# Patient Record
Sex: Female | Born: 1940 | Race: White | Hispanic: No | Marital: Married | Smoking: Former smoker
Health system: Southern US, Community
[De-identification: ages and names within clinical notes are randomized; demographics above are authoritative.]

## PROBLEM LIST (undated history)

## (undated) DIAGNOSIS — I639 Cerebral infarction, unspecified: Secondary | ICD-10-CM

## (undated) DIAGNOSIS — I10 Essential (primary) hypertension: Secondary | ICD-10-CM

## (undated) DIAGNOSIS — J40 Bronchitis, not specified as acute or chronic: Secondary | ICD-10-CM

## (undated) DIAGNOSIS — I340 Nonrheumatic mitral (valve) insufficiency: Secondary | ICD-10-CM

## (undated) DIAGNOSIS — E7800 Pure hypercholesterolemia, unspecified: Secondary | ICD-10-CM

## (undated) DIAGNOSIS — M4854XA Collapsed vertebra, not elsewhere classified, thoracic region, initial encounter for fracture: Secondary | ICD-10-CM

## (undated) DIAGNOSIS — Z8719 Personal history of other diseases of the digestive system: Secondary | ICD-10-CM

## (undated) DIAGNOSIS — Z973 Presence of spectacles and contact lenses: Secondary | ICD-10-CM

## (undated) DIAGNOSIS — I6529 Occlusion and stenosis of unspecified carotid artery: Secondary | ICD-10-CM

## (undated) DIAGNOSIS — Z972 Presence of dental prosthetic device (complete) (partial): Secondary | ICD-10-CM

## (undated) DIAGNOSIS — F419 Anxiety disorder, unspecified: Secondary | ICD-10-CM

## (undated) DIAGNOSIS — J189 Pneumonia, unspecified organism: Secondary | ICD-10-CM

## (undated) DIAGNOSIS — Z9689 Presence of other specified functional implants: Secondary | ICD-10-CM

## (undated) DIAGNOSIS — F0390 Unspecified dementia without behavioral disturbance: Secondary | ICD-10-CM

## (undated) DIAGNOSIS — G894 Chronic pain syndrome: Secondary | ICD-10-CM

## (undated) DIAGNOSIS — I2510 Atherosclerotic heart disease of native coronary artery without angina pectoris: Secondary | ICD-10-CM

## (undated) DIAGNOSIS — K219 Gastro-esophageal reflux disease without esophagitis: Secondary | ICD-10-CM

## (undated) DIAGNOSIS — M1990 Unspecified osteoarthritis, unspecified site: Secondary | ICD-10-CM

## (undated) HISTORY — DX: Essential (primary) hypertension: I10

## (undated) HISTORY — DX: Occlusion and stenosis of unspecified carotid artery: I65.29

## (undated) HISTORY — PX: HYSTEROTOMY: SHX1776

## (undated) HISTORY — PX: SPINAL CORD STIMULATOR INSERTION: SHX5378

## (undated) HISTORY — PX: CHOLECYSTECTOMY: SHX55

## (undated) HISTORY — PX: TONSILLECTOMY: SUR1361

## (undated) HISTORY — PX: BACK SURGERY: SHX140

## (undated) HISTORY — PX: HAND SURGERY: SHX662

## (undated) HISTORY — PX: CAROTID ENDARTERECTOMY: SUR193

## (undated) HISTORY — PX: CARPAL TUNNEL RELEASE: SHX101

## (undated) HISTORY — DX: Atherosclerotic heart disease of native coronary artery without angina pectoris: I25.10

## (undated) HISTORY — PX: MULTIPLE TOOTH EXTRACTIONS: SHX2053

## (undated) HISTORY — DX: Pneumonia, unspecified organism: J18.9

## (undated) HISTORY — DX: Collapsed vertebra, not elsewhere classified, thoracic region, initial encounter for fracture: M48.54XA

## (undated) HISTORY — PX: HIP FRACTURE SURGERY: SHX118

## (undated) HISTORY — DX: Nonrheumatic mitral (valve) insufficiency: I34.0

## (undated) HISTORY — DX: Chronic pain syndrome: G89.4

## (undated) HISTORY — DX: Pure hypercholesterolemia, unspecified: E78.00

---

## 1992-04-26 HISTORY — PX: CARDIAC CATHETERIZATION: SHX172

## 1997-09-26 ENCOUNTER — Other Ambulatory Visit: Admission: RE | Admit: 1997-09-26 | Discharge: 1997-09-26 | Payer: Self-pay | Admitting: Cardiology

## 2007-02-22 ENCOUNTER — Ambulatory Visit (HOSPITAL_COMMUNITY): Admission: RE | Admit: 2007-02-22 | Discharge: 2007-02-22 | Payer: Self-pay | Admitting: Otolaryngology

## 2007-02-27 ENCOUNTER — Encounter: Admission: RE | Admit: 2007-02-27 | Discharge: 2007-02-27 | Payer: Self-pay | Admitting: Otolaryngology

## 2009-06-16 ENCOUNTER — Encounter: Admission: RE | Admit: 2009-06-16 | Discharge: 2009-06-16 | Payer: Self-pay | Admitting: Cardiology

## 2009-12-19 ENCOUNTER — Ambulatory Visit: Payer: Self-pay | Admitting: Cardiology

## 2010-04-24 ENCOUNTER — Ambulatory Visit: Payer: Self-pay | Admitting: Cardiology

## 2010-07-14 ENCOUNTER — Other Ambulatory Visit (HOSPITAL_COMMUNITY)
Admission: RE | Admit: 2010-07-14 | Discharge: 2010-07-14 | Disposition: A | Discharge status: Home / Self Care | Payer: Medicare Other | Source: Ambulatory Visit | Attending: Gynecology | Admitting: Gynecology

## 2010-07-14 ENCOUNTER — Ambulatory Visit (INDEPENDENT_AMBULATORY_CARE_PROVIDER_SITE_OTHER): Payer: Medicare Other | Admitting: Gynecology

## 2010-07-14 ENCOUNTER — Other Ambulatory Visit: Payer: Self-pay | Admitting: Gynecology

## 2010-07-14 DIAGNOSIS — N898 Other specified noninflammatory disorders of vagina: Secondary | ICD-10-CM

## 2010-07-14 DIAGNOSIS — Z124 Encounter for screening for malignant neoplasm of cervix: Secondary | ICD-10-CM | POA: Insufficient documentation

## 2010-07-14 DIAGNOSIS — R82998 Other abnormal findings in urine: Secondary | ICD-10-CM

## 2010-07-14 DIAGNOSIS — B373 Candidiasis of vulva and vagina: Secondary | ICD-10-CM

## 2010-07-30 ENCOUNTER — Encounter: Payer: Self-pay | Admitting: Cardiology

## 2010-07-30 ENCOUNTER — Other Ambulatory Visit: Payer: Self-pay | Admitting: Cardiology

## 2010-07-30 DIAGNOSIS — I340 Nonrheumatic mitral (valve) insufficiency: Secondary | ICD-10-CM | POA: Insufficient documentation

## 2010-07-30 DIAGNOSIS — E7800 Pure hypercholesterolemia, unspecified: Secondary | ICD-10-CM | POA: Insufficient documentation

## 2010-07-30 DIAGNOSIS — I10 Essential (primary) hypertension: Secondary | ICD-10-CM | POA: Insufficient documentation

## 2010-07-30 DIAGNOSIS — R079 Chest pain, unspecified: Secondary | ICD-10-CM

## 2010-07-30 DIAGNOSIS — I2510 Atherosclerotic heart disease of native coronary artery without angina pectoris: Secondary | ICD-10-CM | POA: Insufficient documentation

## 2010-08-05 ENCOUNTER — Encounter: Payer: Self-pay | Admitting: Cardiology

## 2010-08-05 ENCOUNTER — Other Ambulatory Visit (INDEPENDENT_AMBULATORY_CARE_PROVIDER_SITE_OTHER): Payer: Medicare Other | Admitting: *Deleted

## 2010-08-05 ENCOUNTER — Ambulatory Visit (INDEPENDENT_AMBULATORY_CARE_PROVIDER_SITE_OTHER): Payer: Medicare Other | Admitting: Cardiology

## 2010-08-05 DIAGNOSIS — I209 Angina pectoris, unspecified: Secondary | ICD-10-CM

## 2010-08-05 DIAGNOSIS — E7800 Pure hypercholesterolemia, unspecified: Secondary | ICD-10-CM

## 2010-08-05 DIAGNOSIS — M1990 Unspecified osteoarthritis, unspecified site: Secondary | ICD-10-CM | POA: Insufficient documentation

## 2010-08-05 DIAGNOSIS — I10 Essential (primary) hypertension: Secondary | ICD-10-CM

## 2010-08-05 DIAGNOSIS — I2510 Atherosclerotic heart disease of native coronary artery without angina pectoris: Secondary | ICD-10-CM

## 2010-08-05 DIAGNOSIS — M129 Arthropathy, unspecified: Secondary | ICD-10-CM

## 2010-08-05 DIAGNOSIS — F17200 Nicotine dependence, unspecified, uncomplicated: Secondary | ICD-10-CM

## 2010-08-05 DIAGNOSIS — Z720 Tobacco use: Secondary | ICD-10-CM

## 2010-08-05 LAB — HEPATIC FUNCTION PANEL
ALT: 12 U/L (ref 0–35)
AST: 13 U/L (ref 0–37)
Albumin: 3.6 g/dL (ref 3.5–5.2)
Alkaline Phosphatase: 74 U/L (ref 39–117)
Bilirubin, Direct: 0.1 mg/dL (ref 0.0–0.3)
Total Bilirubin: 0.6 mg/dL (ref 0.3–1.2)

## 2010-08-05 LAB — BASIC METABOLIC PANEL: Creatinine, Ser: 0.7 mg/dL (ref 0.4–1.2)

## 2010-08-05 LAB — LIPID PANEL
HDL: 42.3 mg/dL (ref >39.00)
LDL Cholesterol: 67 mg/dL (ref 0–99)
Triglycerides: 146 mg/dL (ref 0.0–149.0)
VLDL: 29.2 mg/dL (ref 0.0–40.0)

## 2010-08-05 NOTE — Progress Notes (Signed)
History of Present Illness: This pleasant 70 year old woman is seen for a scheduled 4 month followup office visit.  She has a past history of atypical chest pain.  She does not have significant coronary disease.  She had a negative cardiac catheterization in 1994.  She had a normal stress Cardiolite in July 2009.  She denies any recent chest pain or shortness of breath.  Denies cough or sputum production.  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  Current Outpatient Prescriptions  Medication Sig Dispense Refill  . amLODipine (NORVASC) 5 MG tablet Take 5 mg by mouth daily.        Marland Kitchen aspirin 81 MG tablet Take 81 mg by mouth daily.        Marland Kitchen atorvastatin (LIPITOR) 40 MG tablet Take 40 mg by mouth daily.        . calcium carbonate (OS-CAL) 600 MG TABS Take 600 mg by mouth 2 (two) times daily with a meal.        . diazepam (VALIUM) 5 MG tablet Take 5 mg by mouth as needed.        . gabapentin (NEURONTIN) 300 MG capsule Take 900 mg by mouth at bedtime.       Marland Kitchen HYDROcodone-acetaminophen (VICODIN) 5-500 MG per tablet Take 1 tablet by mouth as needed.        . metaxalone (SKELAXIN) 800 MG tablet Take 800 mg by mouth as needed.        . metoprolol (LOPRESSOR) 50 MG tablet Take 25 mg by mouth 2 (two) times daily.        . nitroGLYCERIN (NITROSTAT) 0.4 MG SL tablet Place 0.4 mg under the tongue every 5 (five) minutes as needed.        . Omeprazole Magnesium (PRILOSEC OTC PO) Take by mouth.        . valsartan (DIOVAN) 320 MG tablet Take 320 mg by mouth daily.          Allergies  Allergen Reactions  . Hydrochlorothiazide   . Indocin   . Paxil   . Penicillins   . Pravachol   . Quinine Derivatives   . Zocor (Simvastatin - High Dose) Other (See Comments)    Weakness/no energy    Patient Active Problem List  Diagnoses  . Mitral valve regurgitation  . Hypertension  . Hypercholesteremia  . Coronary artery  disease  . Arthritis  . Tobacco abuse    History  Smoking status  . Current Everyday Smoker -- 0.5 packs/day  Smokeless tobacco  . Not on file    History  Alcohol Use No    Family History  Problem Relation Age of Onset  . Heart attack Father     Review of Systems: Constitutional: no fever chills diaphoresis or fatigue or change in weight.  Head and neck: no hearing loss, no epistaxis, no photophobia or visual disturbance. Respiratory: No cough, shortness of breath or wheezing. Cardiovascular: No chest pain peripheral edema, palpitations. Gastrointestinal: No abdominal distention, no abdominal pain, no change in bowel habits hematochezia or melena. Genitourinary: No dysuria, no frequency, no urgency, no nocturia. Musculoskeletal:No arthralgias, no back pain, no gait disturbance or myalgias. Neurological: No dizziness, no headaches, no numbness, no seizures, no syncope, no weakness, no tremors. Hematologic: No lymphadenopathy, no easy bruising. Psychiatric: No confusion, no hallucinations, no sleep disturbance.    Physical Exam: Filed Vitals:   08/05/10 1044  BP: 126/80  Pulse: 55  Her weight is 142, down 3 pounds.  The general appearance reveals a well-developed well-nourished woman in no distress.Pupils equal and reactive.   Extraocular Movements are full.  There is no scleral icterus.  The mouth and pharynx are normal.  The neck is supple.  The carotids reveal no bruits.  The jugular venous pressure is normal.  The thyroid is not enlarged.  There is no lymphadenopathy.The chest is clear to percussion and auscultation. There are no rales or rhonchi. Expansion of the chest is symmetrical.The precordium is quiet.  The first heart sound is normal.  The second heart sound is physiologically split.  There is no murmur gallop rub or click.  There is no abnormal lift or heave.The abdomen is soft and nontender. Bowel sounds are normal. The liver and spleen are not enlarged. There Are  no abdominal masses. There are no bruits.The pedal pulses are good.  There is no phlebitis or edema.  There is no cyanosis or clubbing. She has arthritic deformity of the right hand and right wrist.   Assessment / Plan: Her electrocardiogram today shows sinus bradycardia with borderline first degree heart block and minor nonspecific T-wave flattening.  She also has U waves and we will check a serum potassium to be sure she is not hypokalemic.  From the cardiac standpoint she is okay for surgery on her wrist.

## 2010-08-05 NOTE — Assessment & Plan Note (Signed)
The patient has developed some painful deformities of her right wrist and is anticipating surgery by Dr. Dominica Severin.

## 2010-08-05 NOTE — Assessment & Plan Note (Signed)
The patient has not been experiencing any chest pain or shortness of breath.  She does not have any history of significant ischemic heart disease.  She had a cardiac catheterization in 1994 which showed only scattered atherosclerosis.  Her last nuclear stress test was 11/09/07 and showed no ischemia or scar and her ejection fraction was 71% and it was a normal study.  She had an echocardiogram 06/23/05 showing mild aortic sclerosis and moderate LVH with normal systolic function and with impaired relaxation and trace mitral regurgitation but no definite mitral valve prolapse.

## 2010-08-05 NOTE — Assessment & Plan Note (Signed)
The patient continues to smoke a half pack cigarettes a day.  We counseled her about the importance of quitting altogether.  She denies any cough or sputum production.

## 2010-08-05 NOTE — Assessment & Plan Note (Signed)
Patient denies any headaches or dizziness or other symptoms of high blood pressure.

## 2010-08-07 ENCOUNTER — Telehealth: Payer: Self-pay | Admitting: *Deleted

## 2010-08-07 NOTE — Telephone Encounter (Signed)
Adv patient re-labs 

## 2010-09-04 ENCOUNTER — Telehealth: Payer: Self-pay | Admitting: Cardiology

## 2010-09-04 NOTE — Telephone Encounter (Signed)
Fax: [phone] Last EKG, ECHO, stress, OV

## 2010-09-29 ENCOUNTER — Other Ambulatory Visit: Payer: Self-pay | Admitting: Cardiology

## 2010-09-29 DIAGNOSIS — F419 Anxiety disorder, unspecified: Secondary | ICD-10-CM

## 2010-09-30 NOTE — Telephone Encounter (Signed)
escribe request  

## 2010-11-20 ENCOUNTER — Other Ambulatory Visit: Payer: Self-pay | Admitting: *Deleted

## 2010-11-20 DIAGNOSIS — R52 Pain, unspecified: Secondary | ICD-10-CM

## 2010-11-20 NOTE — Telephone Encounter (Signed)
Refilled meds per fax request.  

## 2010-11-22 MED ORDER — HYDROCODONE-ACETAMINOPHEN 5-500 MG PO TABS: 1.0000 | ORAL_TABLET | ORAL | Status: DC | PRN

## 2010-12-29 ENCOUNTER — Other Ambulatory Visit: Payer: Self-pay | Admitting: Cardiology

## 2010-12-29 DIAGNOSIS — G629 Polyneuropathy, unspecified: Secondary | ICD-10-CM

## 2011-02-01 ENCOUNTER — Other Ambulatory Visit: Payer: Self-pay | Admitting: Cardiology

## 2011-02-02 ENCOUNTER — Encounter: Payer: Self-pay | Admitting: Cardiology

## 2011-02-02 ENCOUNTER — Ambulatory Visit (INDEPENDENT_AMBULATORY_CARE_PROVIDER_SITE_OTHER): Payer: Medicare Other | Admitting: Cardiology

## 2011-02-02 VITALS — BP 137/73 | HR 78 | Ht 65.0 in | Wt 140.0 lb

## 2011-02-02 DIAGNOSIS — G589 Mononeuropathy, unspecified: Secondary | ICD-10-CM

## 2011-02-02 DIAGNOSIS — R52 Pain, unspecified: Secondary | ICD-10-CM

## 2011-02-02 DIAGNOSIS — M1990 Unspecified osteoarthritis, unspecified site: Secondary | ICD-10-CM

## 2011-02-02 DIAGNOSIS — E7800 Pure hypercholesterolemia, unspecified: Secondary | ICD-10-CM

## 2011-02-02 DIAGNOSIS — I10 Essential (primary) hypertension: Secondary | ICD-10-CM

## 2011-02-02 DIAGNOSIS — I119 Hypertensive heart disease without heart failure: Secondary | ICD-10-CM

## 2011-02-02 DIAGNOSIS — G629 Polyneuropathy, unspecified: Secondary | ICD-10-CM

## 2011-02-02 DIAGNOSIS — K219 Gastro-esophageal reflux disease without esophagitis: Secondary | ICD-10-CM

## 2011-02-02 LAB — LIPID PANEL
Cholesterol: 164 mg/dL (ref 0–200)
HDL: 42.8 mg/dL (ref >39.00)
LDL Cholesterol: 84 mg/dL (ref 0–99)
Triglycerides: 184 mg/dL — ABNORMAL HIGH (ref 0.0–149.0)
VLDL: 36.8 mg/dL (ref 0.0–40.0)

## 2011-02-02 LAB — BASIC METABOLIC PANEL
BUN: 17 mg/dL (ref 6–23)
CO2: 28 mEq/L (ref 19–32)
Calcium: 9 mg/dL (ref 8.4–10.5)
Creatinine, Ser: 0.7 mg/dL (ref 0.4–1.2)
GFR: 84.45 mL/min (ref >60.00)

## 2011-02-02 LAB — HEPATIC FUNCTION PANEL
Alkaline Phosphatase: 79 U/L (ref 39–117)
Total Protein: 7 g/dL (ref 6.0–8.3)

## 2011-02-02 MED ORDER — HYDROCODONE-ACETAMINOPHEN 5-500 MG PO TABS: 1.0000 | ORAL_TABLET | ORAL | Status: DC | PRN

## 2011-02-02 NOTE — Assessment & Plan Note (Signed)
Since last visit.  She has had successful surgery on her right thumb by Dr.G ramig

## 2011-02-02 NOTE — Progress Notes (Signed)
Denton Brick Date of Birth:  06-Jul-1940 [facility] [address]   [address] [place], [place]  [address] [phone]           Fax   [phone]  History of Present Illness: This pleasant 70 year old woman is seen for a scheduled four-month followup office visit.  She has a past history of essential hypertension, and history of hypercholesterolemia.  She has ongoing cigarette abuse and smokes a half a pack a day.  She has had a past history of atypical chest pain.  She had a cardiac catheterization in 1994, which showed mild, but diffuse 3 vessel coronary atherosclerosis, as well as a mild mitral valve prolapse.  Her last nuclear stress test was a walking adenosine Cardiolite in 11/09/07, which showed no evidence of ischemia and her ejection fraction was 71%.  Her last echocardiogram was in 2007 and showed normal LV systolic function and impaired relaxation with mild aortic sclerosis and trace mitral regurgitation and normal pulmonary artery pressure.  Current Outpatient Prescriptions  Medication Sig Dispense Refill  . amLODipine (NORVASC) 5 MG tablet Take 5 mg by mouth daily.        Marland Kitchen aspirin 81 MG tablet Take 81 mg by mouth daily.        Marland Kitchen atorvastatin (LIPITOR) 40 MG tablet TAKE ONE TABLET BY MOUTH DAILY  30 tablet  6  . calcium carbonate (OS-CAL) 600 MG TABS Take 600 mg by mouth 2 (two) times daily with a meal.        . diazepam (VALIUM) 5 MG tablet TAKE 1 TABLET BY MOUTH EVERY DAY AS NEEDED  30 tablet  5  . DIOVAN 320 MG tablet TAKE ONE TABLET BY MOUTH EVERY DAY  30 tablet  6  . gabapentin (NEURONTIN) 300 MG capsule TAKE 3 CAPSULES BY MOUTH EVERY NIGHT AT BEDTIME FOR NEUROPATHY  90 capsule  3  . HYDROcodone-acetaminophen (VICODIN) 5-500 MG per tablet Take 1 tablet by mouth as needed.  60 tablet  1  . metaxalone (SKELAXIN) 800 MG tablet Take 800 mg by mouth as needed.        . metoprolol (LOPRESSOR) 50 MG tablet Take 25 mg by mouth 2 (two) times daily.          . nitroGLYCERIN (NITROSTAT) 0.4 MG SL tablet Place 0.4 mg under the tongue every 5 (five) minutes as needed.        . Omeprazole Magnesium (PRILOSEC OTC PO) Take by mouth.          Allergies  Allergen Reactions  . Hydrochlorothiazide   . Indocin   . Paxil   . Penicillins   . Pravachol   . Quinine Derivatives   . Zocor (Simvastatin - High Dose) Other (See Comments)    Weakness/no energy    Patient Active Problem List  Diagnoses  . Mitral valve regurgitation  . Hypertension  . Hypercholesteremia  . Coronary artery disease  . Arthritis  . Tobacco abuse    History  Smoking status  . Current Everyday Smoker -- 0.5 packs/day  Smokeless tobacco  . Not on file    History  Alcohol Use No    Family History  Problem Relation Age of Onset  . Heart attack Father     Review of Systems: Constitutional: no fever chills diaphoresis or fatigue or change in weight.  Head and neck: no hearing loss, no epistaxis, no photophobia or visual disturbance. Respiratory: No cough, shortness of breath or wheezing. Cardiovascular:  No chest pain peripheral edema, palpitations. Gastrointestinal: No abdominal distention, no abdominal pain, no change in bowel habits hematochezia or melena. Genitourinary: No dysuria, no frequency, no urgency, no nocturia. Musculoskeletal:No arthralgias, no back pain, no gait disturbance or myalgias. Neurological: No dizziness, no headaches, no numbness, no seizures, no syncope, no weakness, no tremors. Hematologic: No lymphadenopathy, no easy bruising. Psychiatric: No confusion, no hallucinations, no sleep disturbance.    Physical Exam: Filed Vitals:   02/02/11 1143  BP: 137/73  Pulse: 78   Gen. appearance reveals a well-developed, well-nourished woman in distress.Pupils equal and reactive.   Extraocular Movements are full.  There is no scleral icterus.  The mouth and pharynx are normal.  The neck is supple.  The carotids reveal no bruits.  The jugular  venous pressure is normal.  The thyroid is not enlarged.  There is no lymphadenopathy.  The chest is clear to percussion and auscultation. There are no rales or rhonchi. Expansion of the chest is symmetrical.  The precordium is quiet.  The first heart sound is normal.  The second heart sound is physiologically split.  There is no murmur gallop rub or click.  There is no abnormal lift or heave.  The abdomen is soft and nontender. Bowel sounds are normal. The liver and spleen are not enlarged. There Are no abdominal masses. There are no bruits.  The pedal pulses are good.  There is no phlebitis or edema.  There is no cyanosis or clubbing. Strength is normal and symmetrical in all extremities.  There is no lateralizing weakness.  There are no sensory deficits.  The skin is warm and dry.  There is no rash.   Assessment / Plan:  Patient will continue on same medication.  She was encouraged to continue to try cutting back on cigarettes and eventually stop smoking.  Recheck 4 months for office visit and lab work.  Work today pending.

## 2011-02-02 NOTE — Patient Instructions (Addendum)
continue same dose of medications  Your physician wants you to follow-up in: 4 months You will receive a reminder letter in the mail two months in advance. If you don't receive a letter, please call our office to schedule the follow-up appointment.

## 2011-02-02 NOTE — Assessment & Plan Note (Signed)
The patient remains on Lipitor 40 mg daily.  She has not had any myalgias from Lipitor.  Blood work is pending today

## 2011-02-02 NOTE — Assessment & Plan Note (Signed)
The patient has not been expressing any dizziness or syncope.  No symptoms of congestive heart failure.  He denies any cough

## 2011-02-05 ENCOUNTER — Telehealth: Payer: Self-pay | Admitting: *Deleted

## 2011-02-05 NOTE — Progress Notes (Signed)
Advised of results

## 2011-02-05 NOTE — Telephone Encounter (Signed)
Message copied by Burnell Blanks on Fri Feb 05, 2011  9:36 AM ------      Message from: Cassell Clement      Created: Tue Feb 02, 2011  6:42 PM       CHOL         164   02/02/2011      HDL        42.80   02/02/2011      LDLCALC       84   02/02/2011      TRIG       184.0   02/02/2011      CHOLHDL        4   02/02/2011                  The triglycerides are higher this time.  I want her to be careful with carbohydrates and sweets

## 2011-02-05 NOTE — Telephone Encounter (Signed)
Advised of results

## 2011-04-12 ENCOUNTER — Other Ambulatory Visit: Payer: Self-pay | Admitting: Cardiology

## 2011-04-12 DIAGNOSIS — F419 Anxiety disorder, unspecified: Secondary | ICD-10-CM

## 2011-04-12 NOTE — Telephone Encounter (Signed)
Refill on valium

## 2011-04-16 ENCOUNTER — Other Ambulatory Visit: Payer: Self-pay | Admitting: *Deleted

## 2011-04-16 DIAGNOSIS — F419 Anxiety disorder, unspecified: Secondary | ICD-10-CM

## 2011-04-16 MED ORDER — DIAZEPAM 5 MG PO TABS: 5.0000 mg | ORAL_TABLET | Freq: Every day | ORAL | Status: DC

## 2011-04-16 NOTE — Telephone Encounter (Signed)
Refill on diazepam

## 2011-05-02 ENCOUNTER — Other Ambulatory Visit: Payer: Self-pay | Admitting: Cardiology

## 2011-05-03 NOTE — Telephone Encounter (Signed)
Refilled gabapentin

## 2011-05-12 ENCOUNTER — Other Ambulatory Visit: Payer: Self-pay | Admitting: Cardiology

## 2011-06-02 ENCOUNTER — Other Ambulatory Visit: Payer: Self-pay | Admitting: Cardiology

## 2011-06-02 NOTE — Telephone Encounter (Signed)
Refilled amlodipine 

## 2011-06-08 ENCOUNTER — Ambulatory Visit (INDEPENDENT_AMBULATORY_CARE_PROVIDER_SITE_OTHER): Payer: Medicare Other | Admitting: Cardiology

## 2011-06-08 ENCOUNTER — Encounter: Payer: Self-pay | Admitting: Cardiology

## 2011-06-08 VITALS — BP 146/76 | HR 64 | Ht 65.0 in | Wt 136.1 lb

## 2011-06-08 DIAGNOSIS — F17200 Nicotine dependence, unspecified, uncomplicated: Secondary | ICD-10-CM

## 2011-06-08 DIAGNOSIS — I119 Hypertensive heart disease without heart failure: Secondary | ICD-10-CM

## 2011-06-08 DIAGNOSIS — M129 Arthropathy, unspecified: Secondary | ICD-10-CM

## 2011-06-08 DIAGNOSIS — E7800 Pure hypercholesterolemia, unspecified: Secondary | ICD-10-CM

## 2011-06-08 DIAGNOSIS — I2510 Atherosclerotic heart disease of native coronary artery without angina pectoris: Secondary | ICD-10-CM

## 2011-06-08 DIAGNOSIS — Z716 Tobacco abuse counseling: Secondary | ICD-10-CM

## 2011-06-08 DIAGNOSIS — M1990 Unspecified osteoarthritis, unspecified site: Secondary | ICD-10-CM

## 2011-06-08 LAB — LIPID PANEL
HDL: 51.4 mg/dL (ref >39.00)
LDL Cholesterol: 63 mg/dL (ref 0–99)
Total CHOL/HDL Ratio: 3
Triglycerides: 169 mg/dL — ABNORMAL HIGH (ref 0.0–149.0)

## 2011-06-08 LAB — BASIC METABOLIC PANEL
CO2: 30 mEq/L (ref 19–32)
Calcium: 9.2 mg/dL (ref 8.4–10.5)
Creatinine, Ser: 0.7 mg/dL (ref 0.4–1.2)
GFR: 90.12 mL/min (ref >60.00)
Sodium: 142 mEq/L (ref 135–145)

## 2011-06-08 LAB — HEPATIC FUNCTION PANEL
Alkaline Phosphatase: 66 U/L (ref 39–117)
Bilirubin, Direct: 0.1 mg/dL (ref 0.0–0.3)
Total Bilirubin: 0.6 mg/dL (ref 0.3–1.2)

## 2011-06-08 MED ORDER — BUPROPION HCL ER (SR) 150 MG PO TB12: ORAL_TABLET | ORAL | Status: DC

## 2011-06-08 NOTE — Patient Instructions (Signed)
Will start Wellbutrin SR 150 mg daily for 3 days and then increase to 1 twice a day, Rx sent to [facility] Will obtain labs today and call you with the results (LP,HFP/BMET) Your physician recommends that you schedule a follow-up appointment in: 4 months with fasting labs (lp/bmet/hfp)

## 2011-06-08 NOTE — Progress Notes (Signed)
Sara [name] Date of Birth:  December 26, 1940 [facility] [address] [place], [place]  [address] [phone]         Fax   [phone]  History of Present Illness: This pleasant 71 year old woman is seen for a four-month followup office visit.  She has a past history of essential hypertension and history of hypercholesterolemia.  She has a history of a cardiac catheterization in 1994 which showed only mild three-vessel coronary atherosclerosis.  He had a normal adenosine Cardiolite stress test in 2009.  She had an echocardiogram in 2007 showing mild aortic sclerosis without stenosis.  Her last visit she's been doing well with no new cardiac symptoms.  She has been a chronic smoker but is making a real effort to quit.  She is down to just one or 2 cigarettes a day.  She is smoking a vapor cigarettes and is requesting Wellbutrin.  Current Outpatient Prescriptions  Medication Sig Dispense Refill  . amLODipine (NORVASC) 5 MG tablet Take 5 mg by mouth daily.        Marland Kitchen aspirin 81 MG tablet Take 81 mg by mouth daily.        Marland Kitchen atorvastatin (LIPITOR) 40 MG tablet TAKE ONE TABLET BY MOUTH DAILY  30 tablet  6  . calcium carbonate (OS-CAL) 600 MG TABS Take 600 mg by mouth 2 (two) times daily with a meal.        . diazepam (VALIUM) 5 MG tablet Take 1 tablet (5 mg total) by mouth daily.  30 tablet  5  . DIOVAN 320 MG tablet TAKE ONE TABLET BY MOUTH EVERY DAY  30 tablet  6  . gabapentin (NEURONTIN) 300 MG capsule TAKE 3 CAPSULES BY MOUTH EVERY NIGHT AT BEDTIME FOR NEUROPATHY  90 capsule  11  . HYDROcodone-acetaminophen (VICODIN) 5-500 MG per tablet Take 1 tablet by mouth as needed.  100 tablet  5  . metaxalone (SKELAXIN) 800 MG tablet Take 800 mg by mouth as needed.        . metoprolol (LOPRESSOR) 50 MG tablet TAKE  1/2 TABLET BY MOUTH TWICE DAILY  30 tablet  1  . nitroGLYCERIN (NITROSTAT) 0.4 MG SL tablet Place 0.4 mg under the tongue every 5 (five) minutes as needed.        .  Omeprazole Magnesium (PRILOSEC OTC PO) Take by mouth.        Marland Kitchen buPROPion (WELLBUTRIN SR) 150 MG 12 hr tablet 1 tablet daily for 3 days and then increase to 1 tablet twice a day  60 tablet  3    Allergies  Allergen Reactions  . Hydrochlorothiazide   . Indocin   . Paxil   . Penicillins   . Pravachol   . Quinine Derivatives   . Zocor (Simvastatin - High Dose) Other (See Comments)    Weakness/no energy    Patient Active Problem List  Diagnoses  . Mitral valve regurgitation  . Hypertension  . Hypercholesteremia  . Coronary artery disease  . Arthritis  . Tobacco abuse    History  Smoking status  . Current Everyday Smoker -- 0.5 packs/day  Smokeless tobacco  . Not on file    History  Alcohol Use No    Family History  Problem Relation Age of Onset  . Heart attack Father     Review of Systems: Constitutional: no fever chills diaphoresis or fatigue or change in weight.  Head and neck: no hearing loss, no epistaxis, no photophobia or visual disturbance. Respiratory:  No cough, shortness of breath or wheezing. Cardiovascular: No chest pain peripheral edema, palpitations. Gastrointestinal: No abdominal distention, no abdominal pain, no change in bowel habits hematochezia or melena. Genitourinary: No dysuria, no frequency, no urgency, no nocturia. Musculoskeletal:No arthralgias, no back pain, no gait disturbance or myalgias. Neurological: No dizziness, no headaches, no numbness, no seizures, no syncope, no weakness, no tremors. Hematologic: No lymphadenopathy, no easy bruising. Psychiatric: No confusion, no hallucinations, no sleep disturbance.    Physical Exam: Filed Vitals:   06/08/11 0946  BP: 146/76  Pulse: 64   the general appearance reveals a well-developed well-nourished woman in no distress.The head and neck exam reveals pupils equal and reactive.  Extraocular movements are full.  There is no scleral icterus.  The mouth and pharynx are normal.  The neck is  supple.  The carotids reveal no bruits.  The jugular venous pressure is normal.  The  thyroid is not enlarged.  There is no lymphadenopathy.  The chest is clear to percussion and auscultation.  There are no rales or rhonchi.  Expansion of the chest is symmetrical.  The precordium is quiet.  The first heart sound is normal.  The second heart sound is physiologically split.  There is no murmur gallop rub or click.  There is no abnormal lift or heave.  The abdomen is soft and nontender.  The bowel sounds are normal.  The liver and spleen are not enlarged.  There are no abdominal masses.  There are no abdominal bruits.  Extremities reveal good pedal pulses.  There is no phlebitis or edema.  There is no cyanosis or clubbing.  Strength is normal and symmetrical in all extremities.  There is no lateralizing weakness.  There are no sensory deficits.  The skin is warm and dry.  There is no rash.     Assessment / Plan: Continue same medication.  She is requesting a trial of Wellbutrin to help her quit smoking entirely.  We will give her Wellbutrin SR 150 mg daily for 3 days and then twice a day thereafter. From the cardiac standpoint she is cleared for hand surgery. Recheck here in 4 months for followup office visit and lab work

## 2011-06-08 NOTE — Assessment & Plan Note (Signed)
We are checking lab work today.  She is not having any side effects from her statin therapy

## 2011-06-08 NOTE — Assessment & Plan Note (Signed)
The patient has significant arthritic problems and is scheduled to undergo joint replacement of the base of her left thumb as well as carpal tunnel release on March 1 by Dr. Amanda Pea.

## 2011-06-08 NOTE — Assessment & Plan Note (Signed)
No chest pain or shortness of breath.  No palpitations dizziness or syncope

## 2011-06-09 ENCOUNTER — Telehealth: Payer: Self-pay | Admitting: *Deleted

## 2011-06-09 NOTE — Telephone Encounter (Signed)
Advised of labs 

## 2011-06-09 NOTE — Telephone Encounter (Signed)
Message copied by Burnell Blanks on Wed Jun 09, 2011  5:12 PM ------      Message from: Cassell Clement      Created: Tue Jun 08, 2011  2:18 PM       Please report.  The labs are stable.  Continue same meds.  Continue careful diet.

## 2011-07-15 ENCOUNTER — Other Ambulatory Visit: Payer: Self-pay | Admitting: Cardiology

## 2011-07-15 NOTE — Telephone Encounter (Signed)
Refilled metoprolol 

## 2011-09-13 ENCOUNTER — Other Ambulatory Visit: Payer: Self-pay | Admitting: Cardiology

## 2011-09-13 NOTE — Telephone Encounter (Signed)
Refilled hydrocodone 

## 2011-10-06 ENCOUNTER — Ambulatory Visit (INDEPENDENT_AMBULATORY_CARE_PROVIDER_SITE_OTHER): Payer: Medicare Other | Admitting: Cardiology

## 2011-10-06 ENCOUNTER — Encounter: Payer: Self-pay | Admitting: Cardiology

## 2011-10-06 VITALS — BP 118/70 | Ht 66.0 in | Wt 137.0 lb

## 2011-10-06 DIAGNOSIS — I119 Hypertensive heart disease without heart failure: Secondary | ICD-10-CM

## 2011-10-06 DIAGNOSIS — E7800 Pure hypercholesterolemia, unspecified: Secondary | ICD-10-CM

## 2011-10-06 DIAGNOSIS — I2510 Atherosclerotic heart disease of native coronary artery without angina pectoris: Secondary | ICD-10-CM

## 2011-10-06 DIAGNOSIS — Z720 Tobacco use: Secondary | ICD-10-CM

## 2011-10-06 DIAGNOSIS — F17200 Nicotine dependence, unspecified, uncomplicated: Secondary | ICD-10-CM

## 2011-10-06 LAB — LIPID PANEL
HDL: 44.7 mg/dL (ref >39.00)
LDL Cholesterol: 65 mg/dL (ref 0–99)
Total CHOL/HDL Ratio: 3
Triglycerides: 127 mg/dL (ref 0.0–149.0)
VLDL: 25.4 mg/dL (ref 0.0–40.0)

## 2011-10-06 LAB — HEPATIC FUNCTION PANEL
AST: 14 U/L (ref 0–37)
Albumin: 3.8 g/dL (ref 3.5–5.2)
Total Bilirubin: 0.3 mg/dL (ref 0.3–1.2)

## 2011-10-06 LAB — BASIC METABOLIC PANEL
CO2: 26 mEq/L (ref 19–32)
Calcium: 9.2 mg/dL (ref 8.4–10.5)
Glucose, Bld: 90 mg/dL (ref 70–99)
Potassium: 4.4 mEq/L (ref 3.5–5.1)
Sodium: 141 mEq/L (ref 135–145)

## 2011-10-06 NOTE — Progress Notes (Signed)
Quick Note:  Please report to patient. The recent labs are stable. Continue same medication and careful diet. TGs are much better. ______ 

## 2011-10-06 NOTE — Assessment & Plan Note (Signed)
She is no longer smoking regular cigarettes.  She is using an electric vapor cigarette.  She was unable to take Wellbutrin which had a paradoxical effect

## 2011-10-06 NOTE — Patient Instructions (Addendum)
Your physician recommends that you continue on your current medications as directed. Please refer to the Current Medication list given to you today.  Your physician wants you to follow-up in: 4 month with fasting labs You will receive a reminder letter in the mail two months in advance. If you don't receive a letter, please call our office to schedule the follow-up appointment.

## 2011-10-06 NOTE — Progress Notes (Signed)
Sara [name] Date of Birth:  05/16/1940 [facility] [address] [place], [place]  [address] [phone]  Fax   [phone]  HPI: This pleasant 71 year old woman is seen for a scheduled followup office visit.  She has a history of essential hypertension and a history of hypercholesterolemia.  Cardiac catheterization in 1994 showed mild three-vessel coronary atherosclerosis without obstructing lesions.  She had a normal adenosine Cardiolite stress test in 2009.  Current Outpatient Prescriptions  Medication Sig Dispense Refill  . amLODipine (NORVASC) 5 MG tablet Take 5 mg by mouth daily.        Marland Kitchen aspirin 81 MG tablet Take 81 mg by mouth daily.        Marland Kitchen atorvastatin (LIPITOR) 40 MG tablet TAKE ONE TABLET BY MOUTH DAILY  30 tablet  6  . buPROPion (WELLBUTRIN SR) 150 MG 12 hr tablet 1 tablet daily for 3 days and then increase to 1 tablet twice a day  60 tablet  3  . calcium carbonate (OS-CAL) 600 MG TABS Take 600 mg by mouth 2 (two) times daily with a meal.        . diazepam (VALIUM) 5 MG tablet Take 1 tablet (5 mg total) by mouth daily.  30 tablet  5  . DIOVAN 320 MG tablet TAKE ONE TABLET BY MOUTH EVERY DAY  30 tablet  6  . gabapentin (NEURONTIN) 300 MG capsule TAKE 3 CAPSULES BY MOUTH EVERY NIGHT AT BEDTIME FOR NEUROPATHY  90 capsule  11  . HYDROcodone-acetaminophen (VICODIN) 5-500 MG per tablet TAKE 1 TABLET BY MOUTH TWICE DAILY AS NEEDED  100 tablet  0  . metaxalone (SKELAXIN) 800 MG tablet Take 800 mg by mouth as needed.        . metoprolol (LOPRESSOR) 50 MG tablet TAKE  1/2 TABLET BY MOUTH TWICE DAILY  30 tablet  11  . nitroGLYCERIN (NITROSTAT) 0.4 MG SL tablet Place 0.4 mg under the tongue every 5 (five) minutes as needed.        . Omeprazole Magnesium (PRILOSEC OTC PO) Take by mouth.          Allergies  Allergen Reactions  . Hydrochlorothiazide   . Indomethacin   . Paroxetine Hcl   . Penicillins   . Pravachol   . Quinine Derivatives   . Wellbutrin  (Bupropion)     agitation  . Zocor (Simvastatin - High Dose) Other (See Comments)    Weakness/no energy    Patient Active Problem List  Diagnosis  . Mitral valve regurgitation  . Hypertension  . Hypercholesteremia  . Coronary artery disease  . Arthritis  . Tobacco abuse    History  Smoking status  . Current Everyday Smoker -- 0.5 packs/day  Smokeless tobacco  . Not on file    History  Alcohol Use No    Family History  Problem Relation Age of Onset  . Heart attack Father     Review of Systems: The patient denies any heat or cold intolerance.  No weight gain or weight loss.  The patient denies headaches or blurry vision.  There is no cough or sputum production.  The patient denies dizziness.  There is no hematuria or hematochezia.  The patient denies any muscle aches or arthritis.  The patient denies any rash.  The patient denies frequent falling or instability.  There is no history of depression or anxiety.  All other systems were reviewed and are negative.   Physical Exam: Filed Vitals:   10/06/11  1019  BP: 118/70   general appearance reveals a well-developed well-nourished woman in no distress.The head and neck exam reveals pupils equal and reactive.  Extraocular movements are full.  There is no scleral icterus.  The mouth and pharynx are normal.  The neck is supple.  The carotids reveal no bruits.  The jugular venous pressure is normal.  The  thyroid is not enlarged.  There is no lymphadenopathy.  The chest is clear to percussion and auscultation.  There are no rales or rhonchi.  Expansion of the chest is symmetrical.  The precordium is quiet.  The first heart sound is normal.  The second heart sound is physiologically split.  There is no murmur gallop rub or click.  There is no abnormal lift or heave.  The abdomen is soft and nontender.  The bowel sounds are normal.  The liver and spleen are not enlarged.  There are no abdominal masses.  There are no abdominal bruits.   Extremities reveal good pedal pulses.  There is no phlebitis or edema.  There is no cyanosis or clubbing.  Strength is normal and symmetrical in all extremities.  There is no lateralizing weakness.  There are no sensory deficits.  The skin is warm and dry.  There is no rash.      Assessment / Plan: Continue same meds.  Recheck in 4 months for followup office visit and fasting lab work.  Since we last saw her she had successful left wrist surgery by Dr. Amanda Pea.

## 2011-10-06 NOTE — Assessment & Plan Note (Signed)
No chest pain or shortness of breath or palpitations 

## 2011-10-06 NOTE — Assessment & Plan Note (Signed)
She is not having any side effects from her statin therapy

## 2011-10-08 ENCOUNTER — Telehealth: Payer: Self-pay | Admitting: *Deleted

## 2011-10-08 NOTE — Telephone Encounter (Signed)
Message copied by Burnell Blanks on Fri Oct 08, 2011  9:59 AM ------      Message from: Cassell Clement      Created: Wed Oct 06, 2011  3:02 PM       Please report to patient.  The recent labs are stable. Continue same medication and careful diet. TGs are much better.

## 2011-10-08 NOTE — Telephone Encounter (Signed)
Advised patient of lab results  

## 2011-11-09 ENCOUNTER — Telehealth: Payer: Self-pay | Admitting: *Deleted

## 2011-11-09 ENCOUNTER — Other Ambulatory Visit: Payer: Self-pay | Admitting: *Deleted

## 2011-11-09 ENCOUNTER — Other Ambulatory Visit: Payer: Self-pay | Admitting: Cardiology

## 2011-11-09 DIAGNOSIS — M549 Dorsalgia, unspecified: Secondary | ICD-10-CM

## 2011-11-09 NOTE — Telephone Encounter (Signed)
Opened in error

## 2011-12-31 ENCOUNTER — Other Ambulatory Visit: Payer: Self-pay | Admitting: Cardiology

## 2011-12-31 DIAGNOSIS — M549 Dorsalgia, unspecified: Secondary | ICD-10-CM

## 2012-02-02 ENCOUNTER — Other Ambulatory Visit: Payer: Self-pay | Admitting: *Deleted

## 2012-02-02 DIAGNOSIS — F419 Anxiety disorder, unspecified: Secondary | ICD-10-CM

## 2012-02-02 MED ORDER — DIAZEPAM 5 MG PO TABS: 5.0000 mg | ORAL_TABLET | Freq: Every day | ORAL | Status: DC

## 2012-02-07 ENCOUNTER — Other Ambulatory Visit: Payer: Medicare Other

## 2012-02-07 ENCOUNTER — Ambulatory Visit: Payer: Medicare Other | Admitting: Cardiology

## 2012-02-28 ENCOUNTER — Other Ambulatory Visit: Payer: Self-pay | Admitting: Cardiology

## 2012-02-28 DIAGNOSIS — R52 Pain, unspecified: Secondary | ICD-10-CM

## 2012-03-14 ENCOUNTER — Other Ambulatory Visit (INDEPENDENT_AMBULATORY_CARE_PROVIDER_SITE_OTHER): Payer: Medicare Other

## 2012-03-14 ENCOUNTER — Ambulatory Visit (INDEPENDENT_AMBULATORY_CARE_PROVIDER_SITE_OTHER): Payer: Medicare Other | Admitting: Cardiology

## 2012-03-14 ENCOUNTER — Encounter: Payer: Self-pay | Admitting: Cardiology

## 2012-03-14 VITALS — BP 138/80 | HR 76 | Ht 65.0 in | Wt 137.0 lb

## 2012-03-14 DIAGNOSIS — I259 Chronic ischemic heart disease, unspecified: Secondary | ICD-10-CM

## 2012-03-14 DIAGNOSIS — I10 Essential (primary) hypertension: Secondary | ICD-10-CM

## 2012-03-14 DIAGNOSIS — F17200 Nicotine dependence, unspecified, uncomplicated: Secondary | ICD-10-CM

## 2012-03-14 DIAGNOSIS — I119 Hypertensive heart disease without heart failure: Secondary | ICD-10-CM

## 2012-03-14 DIAGNOSIS — I2510 Atherosclerotic heart disease of native coronary artery without angina pectoris: Secondary | ICD-10-CM

## 2012-03-14 DIAGNOSIS — E7800 Pure hypercholesterolemia, unspecified: Secondary | ICD-10-CM

## 2012-03-14 DIAGNOSIS — Z720 Tobacco use: Secondary | ICD-10-CM

## 2012-03-14 LAB — LIPID PANEL
HDL: 33.7 mg/dL — ABNORMAL LOW (ref >39.00)
Total CHOL/HDL Ratio: 3
VLDL: 27 mg/dL (ref 0.0–40.0)

## 2012-03-14 LAB — BASIC METABOLIC PANEL
BUN: 19 mg/dL (ref 6–23)
CO2: 27 mEq/L (ref 19–32)
Glucose, Bld: 98 mg/dL (ref 70–99)
Potassium: 4.3 mEq/L (ref 3.5–5.1)
Sodium: 138 mEq/L (ref 135–145)

## 2012-03-14 LAB — HEPATIC FUNCTION PANEL
Bilirubin, Direct: 0.1 mg/dL (ref 0.0–0.3)
Total Bilirubin: 0.6 mg/dL (ref 0.3–1.2)

## 2012-03-14 NOTE — Assessment & Plan Note (Signed)
The patient still smokes a few cigarettes against advice.

## 2012-03-14 NOTE — Progress Notes (Signed)
Quick Note:  Please report to patient. The recent labs are stable. Continue same medication and careful diet. ______ 

## 2012-03-14 NOTE — Patient Instructions (Addendum)
Your physician recommends that you continue on your current medications as directed. Please refer to the Current Medication list given to you today.  Your physician recommends that you schedule a follow-up appointment in: 4 months with fasting labs (lp/bmet/hfp)   Will obtain labs today and call you with the results (lp./bmet/hfp) 

## 2012-03-14 NOTE — Progress Notes (Signed)
Sara [name] Date of Birth:  [birth year] [facility] [address] [place], [place]  [address] [phone]  Fax   [phone]  HPI: This pleasant 71 year old woman is seen for a scheduled followup office visit. She has a history of essential hypertension and a history of hypercholesterolemia. Cardiac catheterization in 1994 showed mild three-vessel coronary atherosclerosis without obstructing lesions.  She had a normal adenosine Cardiolite stress test in 2009.    Current Outpatient Prescriptions  Medication Sig Dispense Refill  . amLODipine (NORVASC) 5 MG tablet Take 5 mg by mouth daily.        Marland Kitchen aspirin 81 MG tablet Take 81 mg by mouth daily.        Marland Kitchen atorvastatin (LIPITOR) 40 MG tablet TAKE ONE TABLET BY MOUTH DAILY  30 tablet  6  . buPROPion (WELLBUTRIN SR) 150 MG 12 hr tablet 1 tablet daily for 3 days and then increase to 1 tablet twice a day  60 tablet  3  . calcium carbonate (OS-CAL) 600 MG TABS Take 600 mg by mouth 2 (two) times daily with a meal.        . diazepam (VALIUM) 5 MG tablet Take 1 tablet (5 mg total) by mouth daily.  30 tablet  5  . DIOVAN 320 MG tablet TAKE ONE TABLET BY MOUTH EVERY DAY  30 tablet  6  . gabapentin (NEURONTIN) 300 MG capsule TAKE 3 CAPSULES BY MOUTH EVERY NIGHT AT BEDTIME FOR NEUROPATHY  90 capsule  11  . HYDROcodone-acetaminophen (VICODIN) 5-500 MG per tablet TAKE 1 TABLET BY MOUTH TWICE DAILY AS NEEDED  100 tablet  0  . metaxalone (SKELAXIN) 800 MG tablet Take 800 mg by mouth as needed.        . metoprolol (LOPRESSOR) 50 MG tablet TAKE  1/2 TABLET BY MOUTH TWICE DAILY  30 tablet  11  . nitroGLYCERIN (NITROSTAT) 0.4 MG SL tablet Place 0.4 mg under the tongue every 5 (five) minutes as needed.        . Omeprazole Magnesium (PRILOSEC OTC PO) Take by mouth.          Allergies  Allergen Reactions  . Hydrochlorothiazide   . Indomethacin   . Paroxetine Hcl   . Penicillins   . Pravachol   . Quinine Derivatives   . Wellbutrin  (Bupropion)     agitation  . Zocor (Simvastatin - High Dose) Other (See Comments)    Weakness/no energy    Patient Active Problem List  Diagnosis  . Mitral valve regurgitation  . Hypertension  . Hypercholesteremia  . Coronary artery disease  . Arthritis  . Tobacco abuse    History  Smoking status  . Current Every Day Smoker -- 0.5 packs/day  Smokeless tobacco  . Not on file    History  Alcohol Use No    Family History  Problem Relation Age of Onset  . Heart attack Father     Review of Systems: The patient denies any heat or cold intolerance.  No weight gain or weight loss.  The patient denies headaches or blurry vision.  There is no cough or sputum production.  The patient denies dizziness.  There is no hematuria or hematochezia.  The patient denies any muscle aches or arthritis.  The patient denies any rash.  The patient denies frequent falling or instability.  There is no history of depression or anxiety.  All other systems were reviewed and are negative.   Physical Exam: Filed Vitals:  03/14/12 1103  BP: 138/80  Pulse: 76   the general appearance reveals a well-developed well-nourished woman in no distress.The head and neck exam reveals pupils equal and reactive.  Extraocular movements are full.  There is no scleral icterus.  The mouth and pharynx are normal.  The neck is supple.  The carotids reveal no bruits.  The jugular venous pressure is normal.  The  thyroid is not enlarged.  There is no lymphadenopathy.  The chest is clear to percussion and auscultation.  There are no rales or rhonchi.  Expansion of the chest is symmetrical.  The precordium is quiet.  The first heart sound is normal.  The second heart sound is physiologically split.  There is a soft apical systolic murmur.  There is no abnormal lift or heave.  The abdomen is soft and nontender.  The bowel sounds are normal.  The liver and spleen are not enlarged.  There are no abdominal masses.  There are no  abdominal bruits.  Extremities reveal good pedal pulses.  There is no phlebitis or edema.  There is no cyanosis or clubbing.  Strength is normal and symmetrical in all extremities.  There is no lateralizing weakness.  There are no sensory deficits.  The skin is warm and dry.  There is no rash.      Assessment / Plan: Continue same medication.  Recheck in 4 months for followup office visit lipid panel hepatic function panel and basal metabolic panel.  Blood work today pending

## 2012-03-14 NOTE — Assessment & Plan Note (Signed)
Blood pressure was remaining stable on current therapy.  No severe headaches or dizzy spells.  No symptoms of CHF.

## 2012-03-14 NOTE — Assessment & Plan Note (Signed)
The patient has not had any recurrent chest pain or angina pectoris.  No sublingual nitroglycerin use.

## 2012-03-17 ENCOUNTER — Telehealth: Payer: Self-pay | Admitting: *Deleted

## 2012-03-17 NOTE — Telephone Encounter (Signed)
Advised patient of lab results  

## 2012-03-17 NOTE — Telephone Encounter (Signed)
Message copied by Burnell Blanks on Fri Mar 17, 2012  9:08 AM ------      Message from: Cassell Clement      Created: Tue Mar 14, 2012  4:28 PM       Please report to patient.  The recent labs are stable. Continue same medication and careful diet.

## 2012-03-21 ENCOUNTER — Other Ambulatory Visit: Payer: Self-pay

## 2012-03-21 MED ORDER — ATORVASTATIN CALCIUM 40 MG PO TABS: 40.0000 mg | ORAL_TABLET | Freq: Every day | ORAL | Status: DC

## 2012-04-10 ENCOUNTER — Inpatient Hospital Stay (HOSPITAL_COMMUNITY)
Admission: EM | Admit: 2012-04-10 | Discharge: 2012-04-13 | DRG: 065 | Disposition: A | Discharge status: Home / Self Care | Payer: Medicare Other | Attending: Internal Medicine | Admitting: Internal Medicine

## 2012-04-10 ENCOUNTER — Encounter (HOSPITAL_COMMUNITY): Payer: Self-pay | Admitting: Nurse Practitioner

## 2012-04-10 ENCOUNTER — Emergency Department (HOSPITAL_COMMUNITY): Payer: Medicare Other

## 2012-04-10 DIAGNOSIS — Z79899 Other long term (current) drug therapy: Secondary | ICD-10-CM

## 2012-04-10 DIAGNOSIS — I671 Cerebral aneurysm, nonruptured: Secondary | ICD-10-CM

## 2012-04-10 DIAGNOSIS — R531 Weakness: Secondary | ICD-10-CM

## 2012-04-10 DIAGNOSIS — G8190 Hemiplegia, unspecified affecting unspecified side: Secondary | ICD-10-CM | POA: Diagnosis present

## 2012-04-10 DIAGNOSIS — I2510 Atherosclerotic heart disease of native coronary artery without angina pectoris: Secondary | ICD-10-CM

## 2012-04-10 DIAGNOSIS — E785 Hyperlipidemia, unspecified: Secondary | ICD-10-CM | POA: Diagnosis present

## 2012-04-10 DIAGNOSIS — Z7982 Long term (current) use of aspirin: Secondary | ICD-10-CM

## 2012-04-10 DIAGNOSIS — G609 Hereditary and idiopathic neuropathy, unspecified: Secondary | ICD-10-CM | POA: Diagnosis present

## 2012-04-10 DIAGNOSIS — I340 Nonrheumatic mitral (valve) insufficiency: Secondary | ICD-10-CM

## 2012-04-10 DIAGNOSIS — I720 Aneurysm of carotid artery: Secondary | ICD-10-CM | POA: Diagnosis present

## 2012-04-10 DIAGNOSIS — F17200 Nicotine dependence, unspecified, uncomplicated: Secondary | ICD-10-CM | POA: Diagnosis present

## 2012-04-10 DIAGNOSIS — E7800 Pure hypercholesterolemia, unspecified: Secondary | ICD-10-CM

## 2012-04-10 DIAGNOSIS — Z880 Allergy status to penicillin: Secondary | ICD-10-CM

## 2012-04-10 DIAGNOSIS — I639 Cerebral infarction, unspecified: Secondary | ICD-10-CM

## 2012-04-10 DIAGNOSIS — M1990 Unspecified osteoarthritis, unspecified site: Secondary | ICD-10-CM

## 2012-04-10 DIAGNOSIS — I6522 Occlusion and stenosis of left carotid artery: Secondary | ICD-10-CM

## 2012-04-10 DIAGNOSIS — Z888 Allergy status to other drugs, medicaments and biological substances status: Secondary | ICD-10-CM

## 2012-04-10 DIAGNOSIS — I63239 Cerebral infarction due to unspecified occlusion or stenosis of unspecified carotid arteries: Principal | ICD-10-CM | POA: Diagnosis present

## 2012-04-10 DIAGNOSIS — I10 Essential (primary) hypertension: Secondary | ICD-10-CM

## 2012-04-10 DIAGNOSIS — I498 Other specified cardiac arrhythmias: Secondary | ICD-10-CM | POA: Diagnosis present

## 2012-04-10 DIAGNOSIS — Z720 Tobacco use: Secondary | ICD-10-CM | POA: Diagnosis present

## 2012-04-10 DIAGNOSIS — M6281 Muscle weakness (generalized): Secondary | ICD-10-CM

## 2012-04-10 HISTORY — DX: Personal history of other diseases of the digestive system: Z87.19

## 2012-04-10 HISTORY — DX: Cerebral infarction, unspecified: I63.9

## 2012-04-10 HISTORY — DX: Unspecified osteoarthritis, unspecified site: M19.90

## 2012-04-10 LAB — CBC WITH DIFFERENTIAL/PLATELET
Eosinophils Absolute: 0.2 10*3/uL (ref 0.0–0.7)
Eosinophils Relative: 2 % (ref 0–5)
HCT: 37.8 % (ref 36.0–46.0)
Lymphs Abs: 3.3 10*3/uL (ref 0.7–4.0)
MCH: 30.5 pg (ref 26.0–34.0)
MCV: 92.2 fL (ref 78.0–100.0)
Monocytes Absolute: 0.6 10*3/uL (ref 0.1–1.0)
Monocytes Relative: 6 % (ref 3–12)
Platelets: 272 10*3/uL (ref 150–400)
RBC: 4.1 MIL/uL (ref 3.87–5.11)

## 2012-04-10 LAB — COMPREHENSIVE METABOLIC PANEL
BUN: 20 mg/dL (ref 6–23)
Calcium: 9.5 mg/dL (ref 8.4–10.5)
Creatinine, Ser: 0.78 mg/dL (ref 0.50–1.10)
GFR calc Af Amer: >90 mL/min (ref >90)
GFR calc non Af Amer: 81 mL/min — ABNORMAL LOW (ref >90)
Glucose, Bld: 102 mg/dL — ABNORMAL HIGH (ref 70–99)
Sodium: 141 mEq/L (ref 135–145)
Total Protein: 6.4 g/dL (ref 6.0–8.3)

## 2012-04-10 LAB — URINALYSIS, ROUTINE W REFLEX MICROSCOPIC
Ketones, ur: NEGATIVE mg/dL
Leukocytes, UA: NEGATIVE
Protein, ur: NEGATIVE mg/dL
Urobilinogen, UA: 1 mg/dL (ref 0.0–1.0)

## 2012-04-10 LAB — PROTIME-INR: Prothrombin Time: 12.7 seconds (ref 11.6–15.2)

## 2012-04-10 MED ORDER — ASPIRIN 81 MG PO TABS: 81.0000 mg | ORAL_TABLET | Freq: Every day | ORAL | Status: DC

## 2012-04-10 MED ORDER — SODIUM CHLORIDE 0.9 % IV SOLN: 250.0000 mL | INTRAVENOUS | Status: DC | PRN

## 2012-04-10 MED ORDER — ATORVASTATIN CALCIUM 40 MG PO TABS
40.0000 mg | ORAL_TABLET | Freq: Every day | ORAL | Status: DC
  Administered 2012-04-11 – 2012-04-12 (×2): 40 mg via ORAL
  Filled 2012-04-10 (×3): qty 1

## 2012-04-10 MED ORDER — SODIUM CHLORIDE 0.9 % IV BOLUS (SEPSIS)
500.0000 mL | Freq: Once | INTRAVENOUS | Status: AC
  Administered 2012-04-10: 500 mL via INTRAVENOUS

## 2012-04-10 MED ORDER — ATORVASTATIN CALCIUM 40 MG PO TABS
40.0000 mg | ORAL_TABLET | Freq: Once | ORAL | Status: AC
  Administered 2012-04-11: 40 mg via ORAL
  Filled 2012-04-10: qty 1

## 2012-04-10 MED ORDER — AMLODIPINE BESYLATE 5 MG PO TABS
5.0000 mg | ORAL_TABLET | Freq: Every day | ORAL | Status: DC
  Filled 2012-04-10: qty 1

## 2012-04-10 MED ORDER — ASPIRIN 81 MG PO CHEW
81.0000 mg | CHEWABLE_TABLET | Freq: Every day | ORAL | Status: DC
  Administered 2012-04-11 (×2): 81 mg via ORAL
  Filled 2012-04-10 (×2): qty 1

## 2012-04-10 MED ORDER — DEXTROSE-NACL 5-0.45 % IV SOLN
INTRAVENOUS | Status: AC
  Administered 2012-04-10: 22:00:00 via INTRAVENOUS

## 2012-04-10 MED ORDER — SODIUM CHLORIDE 0.9 % IJ SOLN: 3.0000 mL | INTRAMUSCULAR | Status: DC | PRN

## 2012-04-10 MED ORDER — ACETAMINOPHEN 325 MG PO TABS
650.0000 mg | ORAL_TABLET | Freq: Once | ORAL | Status: AC
  Administered 2012-04-10: 650 mg via ORAL
  Filled 2012-04-10: qty 2

## 2012-04-10 MED ORDER — SODIUM CHLORIDE 0.9 % IJ SOLN
3.0000 mL | Freq: Two times a day (BID) | INTRAMUSCULAR | Status: DC
  Administered 2012-04-11 – 2012-04-12 (×2): 3 mL via INTRAVENOUS

## 2012-04-10 MED ORDER — SODIUM CHLORIDE 0.9 % IJ SOLN
3.0000 mL | Freq: Two times a day (BID) | INTRAMUSCULAR | Status: DC
  Administered 2012-04-11 – 2012-04-13 (×4): 3 mL via INTRAVENOUS

## 2012-04-10 NOTE — ED Provider Notes (Signed)
History     CSN: [id number]  Arrival date & time 04/10/12  1906   First MD Initiated Contact with Patient 04/10/12 1918     Chief Complaint  Patient presents with  . Weakness   HPI: Sara [name] is a 71 yo CF with history of HTN, HLD, tobacco abuse who presents with right arm weakness and headache. Symptoms started this morning at 7 am with frontal HA, described as aching, non-radiating, no exacerbating factors, relieved with Tylenol in ED. At two o'clock this afternoon she was having difficulty writing checks due to hand weakness. It took her one hour to write four checks. These symptoms persisted but she did not seek care until her husband arrived home. She has never had these symptoms before. She denies CP, SOB, syncope, abdominal pain or dizziness.   Past Medical History  Diagnosis Date  . Mitral valve regurgitation     trace  . Hypertension   . Hypercholesteremia   . Coronary artery disease     Past Surgical History  Procedure Date  . Cardiac catheterization 1994    Family History  Problem Relation Age of Onset  . Heart attack Father     History  Substance Use Topics  . Smoking status: Current Every Day Smoker -- 0.5 packs/day    Types: Cigarettes  . Smokeless tobacco: Not on file  . Alcohol Use: No    OB History    Grav Para Term Preterm Abortions TAB SAB Ect Mult Living                  Review of Systems  Constitutional: Negative for fever, chills, diaphoresis and fatigue.  HENT: Negative for congestion, rhinorrhea, trouble swallowing, neck pain and neck stiffness.   Eyes: Negative for photophobia and visual disturbance.  Respiratory: Negative for cough, shortness of breath and wheezing.   Cardiovascular: Negative for chest pain and palpitations.  Gastrointestinal: Negative for nausea, vomiting and abdominal pain.  Genitourinary: Negative for dysuria, decreased urine volume and vaginal pain.  Musculoskeletal: Negative for back pain, joint swelling and  arthralgias.  Skin: Negative for pallor and rash.  Neurological: Positive for weakness (right arm), numbness and headaches. Negative for dizziness, syncope and speech difficulty.  Psychiatric/Behavioral: Negative for confusion and agitation.  All other systems reviewed and are negative.    Allergies  Hydrochlorothiazide; Indomethacin; Paroxetine hcl; Penicillins; Pravachol; Quinine derivatives; Wellbutrin; and Zocor  Home Medications   Current Outpatient Rx  Name  Route  Sig  Dispense  Refill  . AMLODIPINE BESYLATE 5 MG PO TABS   Oral   Take 5 mg by mouth daily.           . ASPIRIN 81 MG PO TABS   Oral   Take 81 mg by mouth daily.           . ATORVASTATIN CALCIUM 40 MG PO TABS   Oral   Take 1 tablet (40 mg total) by mouth daily.   30 tablet   6   . BUPROPION HCL ER (SR) 150 MG PO TB12      1 tablet daily for 3 days and then increase to 1 tablet twice a day   60 tablet   3   . CALCIUM CARBONATE 600 MG PO TABS   Oral   Take 600 mg by mouth 2 (two) times daily with a meal.           . DIAZEPAM 5 MG PO TABS   Oral   Take  1 tablet (5 mg total) by mouth daily.   30 tablet   5   . DIOVAN 320 MG PO TABS      TAKE ONE TABLET BY MOUTH EVERY DAY   30 tablet   6   . GABAPENTIN 300 MG PO CAPS      TAKE 3 CAPSULES BY MOUTH EVERY NIGHT AT BEDTIME FOR NEUROPATHY   90 capsule   11   . HYDROCODONE-ACETAMINOPHEN 5-500 MG PO TABS      TAKE 1 TABLET BY MOUTH TWICE DAILY AS NEEDED   100 tablet   0   . METAXALONE 800 MG PO TABS   Oral   Take 800 mg by mouth as needed.           Marland Kitchen METOPROLOL TARTRATE 50 MG PO TABS      TAKE  1/2 TABLET BY MOUTH TWICE DAILY   30 tablet   11   . NITROGLYCERIN 0.4 MG SL SUBL   Sublingual   Place 0.4 mg under the tongue every 5 (five) minutes as needed.           Marland Kitchen PRILOSEC OTC PO   Oral   Take by mouth.             BP 148/56  Pulse 78  Temp 98.6 F (37 C) (Oral)  Resp 18  Ht 5\' 5"  (1.651 m)  Wt 135 lb 14.4 oz  (61.644 kg)  BMI 22.62 kg/m2  SpO2 99%  Physical Exam  Nursing note and vitals reviewed. Constitutional: She is oriented to person, place, and time. She is cooperative. No distress.       Thin, elderly female. Interactive and cooperative.   HENT:  Head: Normocephalic and atraumatic.  Mouth/Throat: Oropharynx is clear and moist and mucous membranes are normal.  Eyes: Conjunctivae normal and EOM are normal. Pupils are equal, round, and reactive to light.  Neck: Trachea normal and normal range of motion. Neck supple. No JVD present.  Cardiovascular: Normal rate, regular rhythm, S1 normal, S2 normal and normal heart sounds.  Exam reveals no decreased pulses.   Pulmonary/Chest: Effort normal and breath sounds normal. She has no decreased breath sounds.  Abdominal: Soft. Normal appearance and bowel sounds are normal. There is no tenderness.  Musculoskeletal: She exhibits no edema.  Neurological: She is alert and oriented to person, place, and time. She has normal reflexes. No cranial nerve deficit. GCS eye subscore is 4. GCS verbal subscore is 5. GCS motor subscore is 6.       4+/5 strength in major muscle groups RUE 5/5 in all other major muscle groups Drift and decreased sensation noted to right arm  Skin: Skin is warm and dry.    ED Course  Procedures  Labs Reviewed  COMPREHENSIVE METABOLIC PANEL - Abnormal; Notable for the following:    Glucose, Bld 102 (*)     Albumin 3.4 (*)     GFR calc non Af Amer 81 (*)     All other components within normal limits  CBC WITH DIFFERENTIAL  PROTIME-INR  URINALYSIS, ROUTINE W REFLEX MICROSCOPIC  BASIC METABOLIC PANEL  CBC  HEMOGLOBIN A1C  LIPID PANEL   Ct Head Wo Contrast  04/10/2012  *RADIOLOGY REPORT*  Clinical Data: Right-sided weakness and upper extremity numbness.  CT HEAD WITHOUT CONTRAST  Technique:  Contiguous axial images were obtained from the base of the skull through the vertex without contrast.  Comparison: None.  Findings:  Minimally enlarged ventricles and subarachnoid spaces.  Minimal patchy white matter low density in both cerebral hemispheres.  No intracranial hemorrhage, mass lesion or CT evidence of acute infarction.  Bilateral cavernous internal carotid artery atheromatous calcifications.  Unremarkable bones and included paranasal sinuses.  IMPRESSION:  1.  No acute abnormality. 2.  Minimal atrophy and minimal chronic small vessel white matter ischemic changes in both cerebral hemispheres.   Original Report Authenticated By: Beckie Salts, M.D.    1. Right sided weakness   2. Coronary artery disease   3. Hypercholesteremia   4. Hypertension    MDM  71 yo CF with history of HTN, HLD, tobacco abuse who presents with right arm weakness and headache. Afebrile, vital signs stable. Concern for CVA due to persistent right arm weakness. No code stroke initiated as she is outside window. Head CT without acute abnormality. Labs unremarkable. UA not infected. ECG without ongoing ischemia. Discussed with Neurology who recommended small fluid bolus which was given. Again no TPA given as patient is outside the TPA window. Admitted to Hospitalist service in stable condition. BP remained adequate. No new neurologic deficits, in fact symptoms continued to improve. Discussed w/u with patient and need for admission. She is in agreement with plan.   Reviewed imaging, labs and previous medical records, utilized in MDM  Discussed case with Dr. Bebe Shaggy  Clinical Impression 1. Right arm weakness 2. Tobacco abuse         Margie Billet, MD 04/11/12 [phone]

## 2012-04-10 NOTE — ED Notes (Signed)
Per EMS pt Last seen normal 0700 AM 04/10/2012 pt sts feeling right arm weakness and blurry vision around 2pm when she was trying to write checks. No facial droop, weak right grip. BP 169/69 HR 75 R 18

## 2012-04-10 NOTE — Consult Note (Signed)
Reason for Consult: right hemiparesis  CC: right sided weakness  History is obtained from:Patient, family  HPI: Sara [name] is a 71 y.o. female who noticed that she was having trouble writing earlier today. She waited until her husband came home and he insisted she come to the ER. She has a persistent problem. She has some numbness associated.   LSN: Earlier today(unclear but > 3 hours before presentation) tpa given: no, out of window.   ROS: A 14 point ROS was performed and is negative except as noted in the HPI.  Past Medical History  Diagnosis Date  . Mitral valve regurgitation     trace  . Hypertension   . Hypercholesteremia   . Coronary artery disease     Family History: Father - MI  Social History: Tob: smoker  Exam: Current vital signs: BP 148/56  Pulse 78  Temp 98.6 F (37 C) (Oral)  Resp 18  Ht 5\' 5"  (1.651 m)  Wt 61.644 kg (135 lb 14.4 oz)  BMI 22.62 kg/m2  SpO2 99% Vital signs in last 24 hours: Temp:  [97.9 F (36.6 C)-98.7 F (37.1 C)] 98.6 F (37 C) (12/16 2315) Pulse Rate:  [55-78] 78  (12/16 2315) Resp:  [13-18] 18  (12/16 2315) BP: (88-151)/(49-71) 148/56 mmHg (12/16 2315) SpO2:  [96 %-99 %] 99 % (12/16 2315) Weight:  [61.644 kg (135 lb 14.4 oz)] 61.644 kg (135 lb 14.4 oz) (12/16 2315)  General: in bed, NAD CV: RRR Mental Status: Patient is awake, alert, oriented to person, place, month, year, and situation. Immediate and remote memory are intact. Patient is able to give a clear and coherent history. Cranial Nerves: II: Visual Fields are full. Pupils are equal, round, and reactive to light.  Discs are difficult to visualize. III,IV, VI: EOMI without ptosis or diploplia.  V: Facial sensation is symmetric to temperature VII: Facial movement is symmetric.  VIII: hearing is intact to voice X: Uvula elevates symmetrically XI: Shoulder shrug is symmetric. XII: tongue is midline without atrophy or fasciculations.  Motor: Tone is normal.  Bulk is normal. She has a very mild 4+/5 weakness in the RUE, 5/5 otherwise. She does have drift of that arm.  Sensory: Sensation is diminished to pin on the right arm.  Deep Tendon Reflexes: 2+ and symmetric in the biceps and patellae. Cerebellar: FNF are intact on left, consistent with mild weakness on right.  Gait: Did not assess due to multiple monitors in the ER setting.   I have reviewed labs in epic and the results pertinent to this consultation are: CBC, BMP unremarkable.   I have reviewed the images obtained:CT head - no acute findings  Impression: 71 yo F with new right arm weakness and numbness. She likely has a new small stroke. She will need a workup as such.   Recommendations: 1. HgbA1c, fasting lipid panel 2. MRI, MRA  of the brain without contrast 3. PT consult, OT consult 4. Echocardiogram 5. Carotid dopplers 6. Prophylactic therapy-Antiplatelet med: Aspirin - dose 81 mg 7. Risk factor modification 8. Telemetry monitoring 9. Frequent neuro checks     Ritta Slot, MD [facility] [phone]  If 7pm- 7am, please page neurology on call at [phone].

## 2012-04-10 NOTE — H&P (Signed)
Chief Complaint:  Rt hand weakness  HPI: 71 yo female with subjective rt hand weakness for the most of the day today without any other neurological deficits except a headache that started the last several hours in the ed relieveing with tylenol.  No recent illnesses.  No fevers.  No n/v/d.  No cp no sob no le weakness.  No pain in arms.  No abd pain.  No rashes.  Asked to admit for concern of cva.  No focal neuro deficits on exam.  None noted by her husband or dtr who are both  Present.  Review of Systems:  O/w neg  Past Medical History: Past Medical History  Diagnosis Date  . Mitral valve regurgitation     trace  . Hypertension   . Hypercholesteremia   . Coronary artery disease    Past Surgical History  Procedure Date  . Cardiac catheterization 1994    Medications: Prior to Admission medications   Medication Sig Start Date End Date Taking? Authorizing Provider  amLODipine (NORVASC) 5 MG tablet Take 5 mg by mouth daily.     Yes Historical Provider, MD  aspirin 81 MG tablet Take 81 mg by mouth daily.     Yes Historical Provider, MD  atorvastatin (LIPITOR) 40 MG tablet Take 1 tablet (40 mg total) by mouth daily. 03/21/12  Yes Cassell Clement, MD  DIOVAN 320 MG tablet TAKE ONE TABLET BY MOUTH EVERY DAY 02/01/11  Yes Cassell Clement, MD  gabapentin (NEURONTIN) 300 MG capsule TAKE 3 CAPSULES BY MOUTH EVERY NIGHT AT BEDTIME FOR NEUROPATHY 05/02/11  Yes Cassell Clement, MD  HYDROcodone-acetaminophen (VICODIN) 5-500 MG per tablet TAKE 1 TABLET BY MOUTH TWICE DAILY AS NEEDED 02/28/12  Yes Cassell Clement, MD  metaxalone (SKELAXIN) 800 MG tablet Take 800 mg by mouth as needed.     Yes Historical Provider, MD  metoprolol (LOPRESSOR) 50 MG tablet TAKE  1/2 TABLET BY MOUTH TWICE DAILY 07/15/11  Yes Cassell Clement, MD  Omeprazole Magnesium (PRILOSEC OTC PO) Take by mouth.     Yes Historical Provider, MD  buPROPion (WELLBUTRIN SR) 150 MG 12 hr tablet 1 tablet daily for 3 days and then increase  to 1 tablet twice a day 06/08/11   Cassell Clement, MD  calcium carbonate (OS-CAL) 600 MG TABS Take 600 mg by mouth 2 (two) times daily with a meal.      Historical Provider, MD  diazepam (VALIUM) 5 MG tablet Take 1 tablet (5 mg total) by mouth daily. 02/02/12   Cassell Clement, MD  nitroGLYCERIN (NITROSTAT) 0.4 MG SL tablet Place 0.4 mg under the tongue every 5 (five) minutes as needed.      Historical Provider, MD    Allergies:   Allergies  Allergen Reactions  . Hydrochlorothiazide   . Indomethacin   . Paroxetine Hcl   . Penicillins   . Pravachol   . Quinine Derivatives   . Wellbutrin (Bupropion)     agitation  . Zocor (Simvastatin - High Dose) Other (See Comments)    Weakness/no energy    Social History:  reports that she has been smoking Cigarettes.  She has been smoking about .5 packs per day. She does not have any smokeless tobacco history on file. She reports that she does not drink alcohol. Her drug history not on file.  Family History: Family History  Problem Relation Age of Onset  . Heart attack Father     Physical Exam: Filed Vitals:   04/10/12 1919 04/10/12 2022  BP:  138/49   Pulse: 68   Temp: 97.9 F (36.6 C) 98.7 F (37.1 C)  TempSrc: Oral   Resp: 15   SpO2: 97%    General appearance: alert, cooperative and no distress Neck: no JVD and supple, symmetrical, trachea midline Lungs: clear to auscultation bilaterally Heart: regular rate and rhythm, S1, S2 normal, no murmur, click, rub or gallop Abdomen: soft, non-tender; bowel sounds normal; no masses,  no organomegaly Extremities: extremities normal, atraumatic, no cyanosis or edema Pulses: 2+ and symmetric Skin: Skin color, texture, turgor normal. No rashes or lesions Neurologic: Grossly normal  Cranial nn intact 5/5 strength bilateral upper and lower ext    Labs on Admission:   [facility] 04/10/12 1947  NA 141  K 4.1  CL 106  CO2 25  GLUCOSE 102*  BUN 20  CREATININE 0.78  CALCIUM 9.5  MG --   PHOS --    Basename 04/10/12 1947  AST 11  ALT 8  ALKPHOS 88  BILITOT 0.3  PROT 6.4  ALBUMIN 3.4*    Basename 04/10/12 1947  WBC 9.3  NEUTROABS 5.2  HGB 12.5  HCT 37.8  MCV 92.2  PLT 272    Radiological Exams on Admission: Ct Head Wo Contrast  04/10/2012  *RADIOLOGY REPORT*  Clinical Data: Right-sided weakness and upper extremity numbness.  CT HEAD WITHOUT CONTRAST  Technique:  Contiguous axial images were obtained from the base of the skull through the vertex without contrast.  Comparison: None.  Findings: Minimally enlarged ventricles and subarachnoid spaces. Minimal patchy white matter low density in both cerebral hemispheres.  No intracranial hemorrhage, mass lesion or CT evidence of acute infarction.  Bilateral cavernous internal carotid artery atheromatous calcifications.  Unremarkable bones and included paranasal sinuses.  IMPRESSION:  1.  No acute abnormality. 2.  Minimal atrophy and minimal chronic small vessel white matter ischemic changes in both cerebral hemispheres.   Original Report Authenticated By: Beckie Salts, M.D.     Assessment/Plan 71 yo female with rt hand weakness subjective  Principal Problem:  *Right sided weakness Active Problems:  Hypertension  Hypercholesteremia  Coronary artery disease  Tobacco abuse  ekg nsr no acute changes.  cth neg.  Ck mri brain.  Neurology consulted.  Cont baby asa.  No focal def on exam.  Ck neuro ck overnight.  Further recs and w/u per neurology.    Aulton Routt A 04/10/2012, 9:18 PM

## 2012-04-11 ENCOUNTER — Inpatient Hospital Stay (HOSPITAL_COMMUNITY): Payer: Medicare Other

## 2012-04-11 DIAGNOSIS — I671 Cerebral aneurysm, nonruptured: Secondary | ICD-10-CM | POA: Diagnosis present

## 2012-04-11 DIAGNOSIS — I6350 Cerebral infarction due to unspecified occlusion or stenosis of unspecified cerebral artery: Secondary | ICD-10-CM

## 2012-04-11 DIAGNOSIS — I639 Cerebral infarction, unspecified: Secondary | ICD-10-CM | POA: Diagnosis present

## 2012-04-11 LAB — HEMOGLOBIN A1C: Hgb A1c MFr Bld: 5.9 % — ABNORMAL HIGH (ref <5.7)

## 2012-04-11 LAB — BASIC METABOLIC PANEL
Calcium: 9 mg/dL (ref 8.4–10.5)
Creatinine, Ser: 0.62 mg/dL (ref 0.50–1.10)
GFR calc non Af Amer: 87 mL/min — ABNORMAL LOW (ref >90)
Glucose, Bld: 100 mg/dL — ABNORMAL HIGH (ref 70–99)
Sodium: 143 mEq/L (ref 135–145)

## 2012-04-11 LAB — CBC
MCH: 29.9 pg (ref 26.0–34.0)
Platelets: 241 10*3/uL (ref 150–400)
RBC: 3.84 MIL/uL — ABNORMAL LOW (ref 3.87–5.11)
RDW: 13.9 % (ref 11.5–15.5)

## 2012-04-11 LAB — LIPID PANEL
LDL Cholesterol: 52 mg/dL (ref 0–99)
Triglycerides: 166 mg/dL — ABNORMAL HIGH (ref <150)

## 2012-04-11 MED ORDER — SODIUM CHLORIDE 0.9 % IV SOLN: 250.0000 mL | INTRAVENOUS | Status: DC | PRN

## 2012-04-11 MED ORDER — HYDROCODONE-ACETAMINOPHEN 5-325 MG PO TABS
1.0000 | ORAL_TABLET | Freq: Four times a day (QID) | ORAL | Status: DC | PRN
  Administered 2012-04-11: 1 via ORAL
  Filled 2012-04-11: qty 1

## 2012-04-11 MED ORDER — DIAZEPAM 5 MG PO TABS
5.0000 mg | ORAL_TABLET | Freq: Every day | ORAL | Status: DC
  Administered 2012-04-11: 5 mg via ORAL
  Filled 2012-04-11: qty 1

## 2012-04-11 MED ORDER — BUPROPION HCL ER (XL) 150 MG PO TB24
150.0000 mg | ORAL_TABLET | Freq: Every day | ORAL | Status: DC
  Administered 2012-04-11 – 2012-04-13 (×3): 150 mg via ORAL
  Filled 2012-04-11 (×3): qty 1

## 2012-04-11 MED ORDER — HYDROCODONE-ACETAMINOPHEN 5-325 MG PO TABS
1.0000 | ORAL_TABLET | Freq: Two times a day (BID) | ORAL | Status: DC | PRN
  Administered 2012-04-11: 1 via ORAL
  Filled 2012-04-11: qty 1

## 2012-04-11 MED ORDER — HYDROCODONE-ACETAMINOPHEN 5-325 MG PO TABS
1.0000 | ORAL_TABLET | ORAL | Status: DC | PRN
  Administered 2012-04-11 – 2012-04-13 (×6): 1 via ORAL
  Filled 2012-04-11 (×6): qty 1

## 2012-04-11 MED ORDER — ASPIRIN 325 MG PO TABS
325.0000 mg | ORAL_TABLET | Freq: Every day | ORAL | Status: DC
  Administered 2012-04-11 – 2012-04-13 (×3): 325 mg via ORAL
  Filled 2012-04-11 (×3): qty 1

## 2012-04-11 MED ORDER — GABAPENTIN 100 MG PO CAPS
100.0000 mg | ORAL_CAPSULE | Freq: Three times a day (TID) | ORAL | Status: DC
  Administered 2012-04-11 – 2012-04-13 (×6): 100 mg via ORAL
  Filled 2012-04-11 (×9): qty 1

## 2012-04-11 MED ORDER — NICOTINE 14 MG/24HR TD PT24
14.0000 mg | MEDICATED_PATCH | Freq: Every day | TRANSDERMAL | Status: DC
  Administered 2012-04-11 – 2012-04-13 (×3): 14 mg via TRANSDERMAL
  Filled 2012-04-11 (×3): qty 1

## 2012-04-11 MED ORDER — ACETAMINOPHEN 325 MG PO TABS
650.0000 mg | ORAL_TABLET | Freq: Once | ORAL | Status: AC
  Administered 2012-04-11: 650 mg via ORAL
  Filled 2012-04-11: qty 2

## 2012-04-11 MED ORDER — STROKE: EARLY STAGES OF RECOVERY BOOK
Freq: Once | Status: AC
  Administered 2012-04-11: 22:00:00
  Filled 2012-04-11: qty 1

## 2012-04-11 MED ORDER — MORPHINE SULFATE 2 MG/ML IJ SOLN
0.5000 mg | INTRAMUSCULAR | Status: DC | PRN
  Filled 2012-04-11: qty 1

## 2012-04-11 NOTE — Progress Notes (Signed)
Right:  No evidence of hemodynamically significant internal carotid artery stenosis.  Left:  60-79% ICA stenosis.  Bilateral:  Vertebral artery flow is antegrade.

## 2012-04-11 NOTE — Progress Notes (Signed)
Echocardiogram 2D Echocardiogram has been performed.  Sara [name] 04/11/2012, 12:41 PM

## 2012-04-11 NOTE — Consult Note (Signed)
71 year old female presenting with right-sided weakness secondary to presumed embolic infarcts in the left hemisphere. MRI scan demonstrates right internal carotid artery incidental small aneurysm without evidence of hemorrhage. Also questionable no cervical artery trifurcation incidentally discovered aneurysm.  Both these aneurysms are small without evidence of previous hemorrhage. These were incidentally discovered and not germane to the patient's current medical problem. These lesions do not require further workup or treatment at this time. There is no absolute contraindication to antiplatelet therapy or anticoagulation although he is taking these therapies does place the patient at somewhat higher risk for aneurysmal hemorrhage. Currently her rate of hemorrhage from these aneurysms is estimated to be 0.5% per year. Patient may follow up with me for further discussion of treatment options as an outpatient.

## 2012-04-11 NOTE — ED Provider Notes (Signed)
I have personally seen and examined the patient.  I have discussed the plan of care with the resident.  I have reviewed the documentation on PMH/FH/Soc. History.  I have reviewed the documentation of the resident and agree.  Weakness had improved on my evaluation.  Admit for further workup.  Stable in the ED  Joya Gaskins, MD 04/11/12 1056

## 2012-04-11 NOTE — Progress Notes (Signed)
[facility] PROGRESS NOTE  Sara [name] ZOX:[id number] DOB: Dec 14, 1940 DOA: 04/10/2012 PCP: Sheila Oats, MD  Assessment/Plan  Right upper extremity weakness Acute non hemorrhagic infarcts scattered throughout the left hemisphere - source? Stroke team consulted.  We will follow their recommendations CT head without acute changes MRI brain (results below) Carotid dopplers show no significant stenosis. 2-D echo pending.  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   ( Dr. Dutch Quint) We will await their recommendations. Consider Vascular consult pending Neuro surgery recs.  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  Responded well to fluids.  BP medications (metoprolol and amlodipine) are held  Bradycardia In the upper 40s and 50s Appears asymptomatic.  Metoprolol held. Could be associated with acute stroke  Coronary artery disease On 81 mg aspirin and Lipitor  Hypercholesterolemia On Lipitor  Tobacco abuse Half pack a day.  Will requests smoking cessation counseling.  Offer nicotine patch  Code Status:  Presumed full code Family Communication: husband and daughter at bedside  Disposition Plan: likely home when appropriate, possibly tomorrow   Consultants: Stroke service  Procedures:  2-D echo pending, carotid Dopplers pending  Antibiotics:    HPI/Subjective: 71 yo  female with a history of mitral Valve regurgitation, hypertension, hypercholesterolemia, and coronary artery disease, presents to the emergency department with a chief complaint of rt hand weakness for the most of the day today.  She is without any other neurological deficits except a headache that started the last several hours in the ed relieveing with tylenol. No recent illnesses. No fevers. No n/v/d. No cp no sob no le weakness. No pain in arms. No abd pain. No rashes. Asked to admit for concern of cva.  04/11/2012 a.m.: Patient complaining of left leg neuropathic pain.  She has been told her medications are being held until after her MRI.   Objective: Filed Vitals:   04/11/12 0400 04/11/12 0600 04/11/12 0800 04/11/12 1100  BP: 103/91 122/60 138/64 141/60  Pulse: 53 50 57 61  Temp: 97.8 F (36.6 C)  97.9 F (36.6 C) 98.7 F (37.1 C)  TempSrc:   Oral Oral  Resp: 18 16 16 15   Height:      Weight:      SpO2: 98% 99% 97% 95%   No intake or output data in the 24 hours ending 04/11/12 1123 Filed Weights   04/10/12 2315  Weight: 61.644 kg (135 lb 14.4 oz)    Exam:  General:  Alert and oriented, appears uncomfortable, left leg continuously moving HEENT:  Mouth continuously moving, patient appears unaware. Neck supple. Cardiovascular: sinus bradycardia on telemetry, no murmurs rubs or gallops, no lower extremity edema Respiratory: clear to auscultation no wheezes crackles or rales Abdomen: soft nontender nondistended, with positive bowel sounds, no masses Musculoskeletal: Patient continuously moving leg left leg during my presence.  Strength 5/5in the left upper extremity. 3/5 in right upper extremity  Data Reviewed: Basic Metabolic Panel:  Lab 04/11/12 8119 04/10/12 1947  NA 143  141  K 3.6 4.1  CL 108 106  CO2 25 25  GLUCOSE 100* 102*  BUN 13 20  CREATININE 0.62 0.78  CALCIUM 9.0 9.5  MG -- --  PHOS -- --   Liver Function Tests:  Lab 04/10/12 1947  AST 11  ALT 8   ALKPHOS 88  BILITOT 0.3  PROT 6.4  ALBUMIN 3.4*   CBC:  Lab 04/11/12 0610 04/10/12 1947  WBC 9.3 9.3  NEUTROABS -- 5.2  HGB 11.5* 12.5  HCT 35.5* 37.8  MCV 92.4 92.2  PLT 241 272     Studies: Ct Head Wo Contrast  04/10/2012  *RADIOLOGY REPORT*  Clinical Data: Right-sided weakness and upper extremity numbness.  CT HEAD WITHOUT CONTRAST  Technique:  Contiguous axial images were obtained from the base of the skull through the vertex without contrast.  Comparison: None.  Findings: Minimally enlarged ventricles and subarachnoid spaces. Minimal patchy white matter low density in both cerebral hemispheres.  No intracranial hemorrhage, mass lesion or CT evidence of acute infarction.  Bilateral cavernous internal carotid artery atheromatous calcifications.  Unremarkable bones and included paranasal sinuses.  IMPRESSION:  1.  No acute abnormality. 2.  Minimal atrophy and minimal chronic small vessel white matter ischemic changes in both cerebral hemispheres.   Original Report Authenticated By: Beckie Salts, M.D.    Mr [facility] Wo Contrast  04/11/2012  *RADIOLOGY REPORT*  Clinical Data:  Right hand weakness.  Hypertensive with hypercholesterolemia.  MRI BRAIN WITHOUT CONTRAST MRA HEAD WITHOUT CONTRAST  Technique: Multiplanar, multiecho pulse sequences of the brain and surrounding structures were obtained according to standard protocol without intravenous contrast.  Angiographic images of the head were obtained using MRA technique without contrast.  Comparison: 04/10/2012.  MRI HEAD  Findings:  Acute non hemorrhagic infarcts scattered throughout the left hemisphere involving portions of the left frontal lobe (including the left caudate body), left parietal lobe, left temporal lobe and left thalamus.  No intracranial hemorrhage.  Minimal small vessel disease type changes.  No hydrocephalus.  No intracranial mass lesion detected on this unenhanced exam.  Major intracranial vascular structures are patent.   IMPRESSION: Acute non hemorrhagic infarcts scattered throughout the left hemisphere as noted above.  MRA HEAD  Findings: Ectasia and irregularity involving portions of the pre cavernous, cavernous and supraclinoid segment of the internal carotid artery bilaterally without high-grade stenosis.  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  Hypoplastic irregular narrowed A1 segment of the left anterior cerebral artery.  Azygos configuration of the A2 segment of the anterior cerebral artery.  Question of complex aneurysm at the left middle cerebral artery bifurcation from which vessels arise measuring up to 3.5 mm.  Mild to moderate middle cerebral artery branch vessel irregularity.  Codominant vertebral arteries without significant narrowing.  No significant stenosis of the basilar artery.  Poor delineation AICAs.  Mild irregularity and narrowing of the posterior cerebral arteries greater on the left.  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  Hypoplastic irregular narrowed A1 segment of the left anterior cerebral artery.  Azygos configuration of the A2 segment of the anterior cerebral artery.  Question of complex aneurysm at the left middle cerebral artery bifurcation from which vessels arise measuring up to 3.5 mm.  Mild to moderate middle cerebral artery branch vessel irregularity.  Poor delineation AICAs.  Mild irregularity and narrowing of the posterior cerebral arteries greater on  the left.  This has been made a PRA call report utilizing dashboard call feature.   Original Report Authenticated By: Lacy Duverney, M.D.    Mr Brain Wo Contrast  04/11/2012  *RADIOLOGY REPORT*  Clinical Data:  Right hand weakness.  Hypertensive with hypercholesterolemia.  MRI BRAIN WITHOUT CONTRAST MRA HEAD WITHOUT CONTRAST  Technique: Multiplanar, multiecho pulse sequences of the brain and  surrounding structures were obtained according to standard protocol without intravenous contrast.  Angiographic images of the head were obtained using MRA technique without contrast.  Comparison: 04/10/2012.  MRI HEAD  Findings:  Acute non hemorrhagic infarcts scattered throughout the left hemisphere involving portions of the left frontal lobe (including the left caudate body), left parietal lobe, left temporal lobe and left thalamus.  No intracranial hemorrhage.  Minimal small vessel disease type changes.  No hydrocephalus.  No intracranial mass lesion detected on this unenhanced exam.  Major intracranial vascular structures are patent.  IMPRESSION: Acute non hemorrhagic infarcts scattered throughout the left hemisphere as noted above.  MRA HEAD  Findings: Ectasia and irregularity involving portions of the pre cavernous, cavernous and supraclinoid segment of the internal carotid artery bilaterally without high-grade stenosis.  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  Hypoplastic irregular narrowed A1 segment of the left anterior cerebral artery.  Azygos configuration of the A2 segment of the anterior cerebral artery.  Question of complex aneurysm at the left middle cerebral artery bifurcation from which vessels arise measuring up to 3.5 mm.  Mild to moderate middle cerebral artery branch vessel irregularity.  Codominant vertebral arteries without significant narrowing.  No significant stenosis of the basilar artery.  Poor delineation AICAs.  Mild irregularity and narrowing of the posterior cerebral arteries greater on the left.  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  Hypoplastic irregular narrowed A1 segment of the left anterior cerebral artery.  Azygos configuration of the A2 segment of the anterior cerebral artery.  Question of complex aneurysm at the left middle  cerebral artery bifurcation from which vessels arise measuring up to 3.5 mm.  Mild to moderate middle cerebral artery branch vessel irregularity.  Poor delineation AICAs.  Mild irregularity and narrowing of the posterior cerebral arteries greater on the left.  This has been made a PRA call report utilizing dashboard call feature.   Original Report Authenticated By: Lacy Duverney, M.D.     Scheduled Meds:    . aspirin  81 mg Oral Daily  . atorvastatin  40 mg Oral q1800  . buPROPion  150 mg Oral Daily  . gabapentin  100 mg Oral TID  . nicotine  14 mg Transdermal Daily  . sodium chloride  3 mL Intravenous Q12H  . sodium chloride  3 mL Intravenous Q12H   Continuous Infusions:   Principal Problem:  *CVA (cerebral infarction) Active Problems:  Hypertension  Hypercholesteremia  Coronary artery disease  Tobacco abuse  Right sided weakness  Aneurysm of right internal carotid artery    Time spent: 30 min.    Stephani Police  [facility] Pager [phone]. If 8PM-8AM, please contact night-coverage at [email or web address], password [facility] 04/11/2012, 11:23 AM  LOS: 1 day

## 2012-04-11 NOTE — Progress Notes (Signed)
Nutrition Brief Note  Patient identified on the Malnutrition Screening Tool (MST) Report for recent weight loss without trying.  Per readings below, patient has had some weight loss since 2011; not significant for time frame.  Wt Readings from Last 10 Encounters:  04/10/12 135 lb 14.4 oz (61.644 kg)  03/14/12 137 lb (62.143 kg)  10/06/11 137 lb (62.143 kg)  06/08/11 136 lb 1.9 oz (61.744 kg)  02/02/11 140 lb (63.504 kg)  08/05/10 142 lb (64.411 kg)  04/23/10 145 lb (65.772 kg)    Body mass index is 22.62 kg/(m^2). Pt meets criteria for Normal based on current BMI.   Current diet order is Heart Healthy.  Labs and medications reviewed.   No nutrition interventions warranted at this time.  Please consult RD as needed.  Kirkland Hun, RD, LDN Pager #: [phone] After-Hours Pager #: [phone]

## 2012-04-11 NOTE — Progress Notes (Signed)
Patient seen and examined by me.  Appreciate neurology and neurosurgery.   Await neurology recommendations.  Patient is in pain- resume some of patient's home meds.  Marlin Canary DO

## 2012-04-11 NOTE — Progress Notes (Signed)
Stroke Team Progress Note  HISTORY  Sara [name] is a 71 y.o. female who noticed that she was having trouble writing earlier today. She waited until her husband came home and he insisted she come to the ER. She has a persistent problem. She has some numbness associated.  LSN: Earlier today(unclear but > 3 hours before presentation)  TPA not given due to pt being out of therapeutic window.   SUBJECTIVE There are multiple family members in the room this morning. Dr. Pearlean Brownie had a long talk with the patient and her family. The patient still reports right upper extremity numbness and weakness with little change from yesterday. She denies any speech problems. The patient's MRA revealed 2 small cerebral aneurysms; however, Dr. Pearlean Brownie felt that the risk outweighed the possible benefits of treatment. At any rate this issue could not be addressed at this time due to her recent CVA.   OBJECTIVE Most recent Vital Signs: Filed Vitals:   04/11/12 0600 04/11/12 0800 04/11/12 1100 04/11/12 1258  BP: 122/60 138/64 141/60 134/72  Pulse: 50 57 61 47  Temp:  97.9 F (36.6 C) 98.7 F (37.1 C) 97.9 F (36.6 C)  TempSrc:  Oral Oral Oral  Resp: 16 16 15 16   Height:      Weight:      SpO2: 99% 97% 95% 96%   CBG (last 3)  No results found for this basename: GLUCAP:3 in the last 72 hours  IV Fluid Intake:     MEDICATIONS    . aspirin  81 mg Oral Daily  . atorvastatin  40 mg Oral q1800  . buPROPion  150 mg Oral Daily  . gabapentin  100 mg Oral TID  . nicotine  14 mg Transdermal Daily  . sodium chloride  3 mL Intravenous Q12H  . sodium chloride  3 mL Intravenous Q12H   PRN:  sodium chloride, HYDROcodone-acetaminophen, sodium chloride  Diet:  Cardiac thin liquids Activity:  Up with assistance DVT Prophylaxis:  SCDs  CLINICALLY SIGNIFICANT STUDIES Basic Metabolic Panel:  Lab 04/11/12 1308 04/10/12 1947  NA 143 141  K 3.6 4.1  CL 108 106  CO2 25 25  GLUCOSE 100* 102*  BUN 13 20  CREATININE  0.62 0.78  CALCIUM 9.0 9.5  MG -- --  PHOS -- --   Liver Function Tests:  Lab 04/10/12 1947  AST 11  ALT 8  ALKPHOS 88  BILITOT 0.3  PROT 6.4  ALBUMIN 3.4*   CBC:  Lab 04/11/12 0610 04/10/12 1947  WBC 9.3 9.3  NEUTROABS -- 5.2  HGB 11.5* 12.5  HCT 35.5* 37.8  MCV 92.4 92.2  PLT 241 272   Coagulation:  Lab 04/10/12 1947  LABPROT 12.7  INR 0.96   Cardiac Enzymes: No results found for this basename: CKTOTAL:3,CKMB:3,CKMBINDEX:3,TROPONINI:3 in the last 168 hours Urinalysis:  Lab 04/10/12 2139  COLORURINE YELLOW  LABSPEC 1.013  PHURINE 7.0  GLUCOSEU NEGATIVE  HGBUR NEGATIVE  BILIRUBINUR NEGATIVE  KETONESUR NEGATIVE  PROTEINUR NEGATIVE  UROBILINOGEN 1.0  NITRITE NEGATIVE  LEUKOCYTESUR NEGATIVE   Lipid Panel    Component Value Date/Time   CHOL 123 04/11/2012 0610   TRIG 166* 04/11/2012 0610   HDL 38* 04/11/2012 0610   CHOLHDL 3.2 04/11/2012 0610   VLDL 33 04/11/2012 0610   LDLCALC 52 04/11/2012 0610   HgbA1C  Lab Results  Component Value Date   HGBA1C 5.9* 04/11/2012    Urine Drug Screen:   No results found for this basename: labopia, cocainscrnur,  labbenz, amphetmu, thcu, labbarb    Alcohol Level: No results found for this basename: ETH:2 in the last 168 hours  Ct Head Wo Contrast  04/10/2012  IMPRESSION:  1.  No acute abnormality. 2.  Minimal atrophy and minimal chronic small vessel white matter ischemic changes in both cerebral hemispheres.    Mr Maxine Glenn Head Wo Contrast  04/11/2012   IMPRESSION: 3.3 x 1.2 mm aneurysm arising from the right internal carotid artery cavernous segment directed laterally.  Hypoplastic irregular narrowed A1 segment of the left anterior cerebral artery.  Azygos configuration of the A2 segment of the anterior cerebral artery.  Question of complex aneurysm at the left middle cerebral artery bifurcation from which vessels arise measuring up to 3.5 mm.  Mild to moderate middle cerebral artery branch vessel irregularity.  Poor  delineation AICAs.  Mild irregularity and narrowing of the posterior cerebral arteries greater on the left.    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  Hypoplastic irregular narrowed A1 segment of the left anterior cerebral artery.  Azygos configuration of the A2 segment of the anterior cerebral artery.  Question of complex aneurysm at the left middle cerebral artery bifurcation from which vessels arise measuring up to 3.5 mm.  Mild to moderate middle cerebral artery branch vessel irregularity.  Poor delineation AICAs.  Mild irregularity and narrowing of the posterior cerebral arteries greater on the left.    2D Echocardiogram  - EF 55-60% LA mildly dilated.  Carotid Doppler  Right: No evidence of hemodynamically significant internal carotid artery stenosis. Left: 60-79% ICA stenosis. Bilateral: Vertebral artery flow is antegrade.   EKG - Pending  Therapy Recommendations - Pending  Physical Exam   Pleasant elderly caucasian lady not in distress..Awake alert. Afebrile. Head is nontraumatic. Neck is supple without bruit. Hearing is normal. Cardiac exam no murmur or gallop. Lungs are clear to auscultation. Distal pulses are well felt.  Neurological Exam ; Awake  Alert oriented x 3. Normal speech and language.eye movements full without nystagmus. Face symmetric. Tongue midline. Normal strength except for mild right grip weakness and diminished fine finger movements on the right. Orbits left over right approximately. Normal tone, reflexes and coordination. Normal sensation. Gait deferred.  ASSESSMENT [name] is a 71 y.o. female presenting with right upper extremity weakness and numbness. The patient did not receive TPA as she was already outside of the therapeutic window.  Imaging confirmed acute non-hemorrhagic infarcts scattered throughout the left hemisphere consistent with  an embolic etiology. The patient was also found to have the above noted cerebral aneurysms by MRA. No treatment was recommended at this time-please see above note. Also see neurosurgery consult Dr. Jordan Likes. On aspirin 81 mg orally every day prior to admission. Now on aspirin 81 mg orally every day for secondary stroke prevention. Patient with resultant right upper extremity weakness and numbness..   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  2 small cerebral aneurysms noted on MRA. Please see neurosurgery consult note from Dr. Jordan Likes.  Hypertension  Hyperlipidemia  Corner artery disease  Tobacco history Hospital day # 1  TREATMENT/PLAN  Increase aspirin to 325 mg daily for secondary stroke prevention.  Followup MRA in 3 months  TEE to further evaluate embolic etiology.  Delton See PA-C [facility] Pager [phone] 04/11/2012, 3:22 PM  I have personally obtained a history, examined the patient,  evaluated imaging results, and formulated the assessment and plan of care. I agree with the above.  Delia Heady, MD Medical Director [facility] Pager: [phone] 04/11/2012 6:29 PM

## 2012-04-11 NOTE — Evaluation (Signed)
Physical Therapy Evaluation Patient Details Name: Sara [name] MRN: [id number] DOB: Apr 28, 1940 Today's Date: 04/11/2012 Time: 4098-1191 PT Time Calculation (min): 28 min  PT Assessment / Plan / Recommendation Clinical Impression  pt adm with reports of R sided weakness, specifically in the hand.  On eval, noted mild gait instability and decr activity tolerance.  pt can benefit from PT to maximize I    PT Assessment  Patient needs continued PT services    Follow Up Recommendations  Outpatient PT;Other (comment) (or may just need OT for UE work)    Does the patient have the potential to tolerate intense rehabilitation      Barriers to Discharge        Equipment Recommendations  None recommended by PT    Recommendations for Other Services     Frequency Min 3X/week    Precautions / Restrictions Precautions Precautions: Fall   Pertinent Vitals/Pain       Mobility  Bed Mobility Bed Mobility: Supine to Sit;Sitting - Scoot to Edge of Bed;Sit to Supine Supine to Sit: 5: Supervision;HOB flat Sitting - Scoot to Edge of Bed: 5: Supervision Sit to Supine: 5: Supervision Details for Bed Mobility Assistance: safe mobility Transfers Transfers: Sit to Stand;Stand to Sit Sit to Stand: 5: Supervision;With upper extremity assist;From bed Stand to Sit: 5: Supervision;With upper extremity assist;To bed Details for Transfer Assistance: no assist needed Ambulation/Gait Ambulation/Gait Assistance: 4: Min assist;4: Min guard Ambulation Distance (Feet): 160 Feet Assistive device: None Ambulation/Gait Assistance Details: generally steady, but had episodes of instability that she could self recover from Gait Pattern: Step-through pattern Stairs: No Modified Rankin (Stroke Patients Only) Pre-Morbid Rankin Score: No symptoms Modified Rankin: Moderate disability    Shoulder Instructions     Exercises     PT Diagnosis: Generalized weakness;Other (comment) (Right side weakness, mostly  UE)  PT Problem List: Decreased strength;Decreased activity tolerance;Decreased balance;Decreased mobility;Decreased knowledge of use of DME PT Treatment Interventions: DME instruction;Gait training;Stair training;Functional mobility training;Therapeutic activities;Patient/family education   PT Goals Acute Rehab PT Goals PT Goal Formulation: With patient Time For Goal Achievement: 04/11/12 Potential to Achieve Goals: Good Pt will Transfer Bed to Chair/Chair to Bed: Independently PT Transfer Goal: Bed to Chair/Chair to Bed - Progress: Goal set today Pt will Ambulate: 51 - 150 feet;with supervision PT Goal: Ambulate - Progress: Goal set today Pt will Go Up / Down Stairs: 3-5 stairs;with supervision;with rail(s) PT Goal: Up/Down Stairs - Progress: Goal set today  Visit Information  Last PT Received On: 04/11/12 Assistance Needed: +1    Subjective Data  Subjective: It's really just my hand Patient Stated Goal: Independent   Prior Functioning  Home Living Lives With: Spouse Available Help at Discharge: Family Type of Home: House Home Access: Level entry Home Layout: One level Bathroom Shower/Tub: Tub/shower unit;Door [employer]: Standard Prior Function Level of Independence: Independent Able to Take Stairs?: Yes Driving: Yes Communication Communication: No difficulties    Cognition  Overall Cognitive Status: Appears within functional limits for tasks assessed/performed Arousal/Alertness: Awake/alert Orientation Level: Appears intact for tasks assessed Behavior During Session: [facility] for tasks performed    Extremity/Trunk Assessment Right Lower Extremity Assessment RLE ROM/Strength/Tone: [facility] for tasks assessed;Deficits RLE ROM/Strength/Tone Deficits: hip flexors and quads >=3+/5, Hams/df/pf 4/5 RLE Sensation: WFL - Light Touch RLE Coordination: WFL - gross motor Left Lower Extremity Assessment LLE ROM/Strength/Tone: WFL for tasks assessed (proximal weakness, but  stronger quads than L side) LLE Sensation: WFL - Light Touch   Balance Balance Balance  Assessed: Yes Dynamic Standing Balance Dynamic Standing - Balance Support: During functional activity;No upper extremity supported Dynamic Standing - Level of Assistance: 4: Min assist;5: Stand by assistance  End of Session PT - End of Session Activity Tolerance: Patient tolerated treatment well Patient left: in bed;with call bell/phone within reach Nurse Communication: Mobility status  GP     Medora Roorda, Eliseo Gum 04/11/2012, 3:51 PM  04/11/2012  [place], PT [phone] (pager)

## 2012-04-12 ENCOUNTER — Encounter (HOSPITAL_COMMUNITY)
Admission: EM | Disposition: A | Discharge status: Home / Self Care | Payer: Self-pay | Source: Home / Self Care | Attending: Internal Medicine

## 2012-04-12 ENCOUNTER — Encounter (HOSPITAL_COMMUNITY): Payer: Self-pay | Admitting: *Deleted

## 2012-04-12 DIAGNOSIS — I63239 Cerebral infarction due to unspecified occlusion or stenosis of unspecified carotid arteries: Secondary | ICD-10-CM

## 2012-04-12 DIAGNOSIS — I6789 Other cerebrovascular disease: Secondary | ICD-10-CM

## 2012-04-12 DIAGNOSIS — I671 Cerebral aneurysm, nonruptured: Secondary | ICD-10-CM

## 2012-04-12 HISTORY — PX: TEE WITHOUT CARDIOVERSION: SHX5443

## 2012-04-12 LAB — BASIC METABOLIC PANEL
BUN: 18 mg/dL (ref 6–23)
CO2: 27 mEq/L (ref 19–32)
Chloride: 107 mEq/L (ref 96–112)
Glucose, Bld: 84 mg/dL (ref 70–99)
Potassium: 3.3 mEq/L — ABNORMAL LOW (ref 3.5–5.1)
Sodium: 143 mEq/L (ref 135–145)

## 2012-04-12 SURGERY — ECHOCARDIOGRAM, TRANSESOPHAGEAL
Anesthesia: Moderate Sedation

## 2012-04-12 MED ORDER — MIDAZOLAM HCL 10 MG/2ML IJ SOLN
INTRAMUSCULAR | Status: DC | PRN
  Administered 2012-04-12: 2 mg via INTRAVENOUS
  Administered 2012-04-12: 1 mg via INTRAVENOUS
  Administered 2012-04-12: 2 mg via INTRAVENOUS

## 2012-04-12 MED ORDER — MIDAZOLAM HCL 5 MG/ML IJ SOLN
INTRAMUSCULAR | Status: AC
  Filled 2012-04-12: qty 2

## 2012-04-12 MED ORDER — FENTANYL CITRATE 0.05 MG/ML IJ SOLN
INTRAMUSCULAR | Status: DC | PRN
  Administered 2012-04-12 (×2): 25 ug via INTRAVENOUS

## 2012-04-12 MED ORDER — FENTANYL CITRATE 0.05 MG/ML IJ SOLN
INTRAMUSCULAR | Status: AC
  Filled 2012-04-12: qty 2

## 2012-04-12 MED ORDER — PANTOPRAZOLE SODIUM 40 MG PO TBEC
40.0000 mg | DELAYED_RELEASE_TABLET | Freq: Every day | ORAL | Status: DC
  Administered 2012-04-12 – 2012-04-13 (×2): 40 mg via ORAL
  Filled 2012-04-12 (×2): qty 1

## 2012-04-12 MED ORDER — BUTAMBEN-TETRACAINE-BENZOCAINE 2-2-14 % EX AERO
INHALATION_SPRAY | CUTANEOUS | Status: DC | PRN
  Administered 2012-04-12: 2 via TOPICAL

## 2012-04-12 NOTE — Progress Notes (Signed)
Utilization review completed.  

## 2012-04-12 NOTE — CV Procedure (Signed)
See TEE report in [employer]; normal LV function; negative saline microcavitation study; severe atherosclerosis descending aorta. Olga Millers

## 2012-04-12 NOTE — Progress Notes (Signed)
Stroke Team Progress Note  HISTORY  Sara [name] is a 71 y.o. female who noticed that she was having trouble writing earlier today 04/10/2012. She waited until her husband came home and he insisted she come to the ER. She has a persistent problem. She has some numbness associated. She was not given TPA due to pt being out of therapeutic window. She was admitted for further evaluation and treatment.   SUBJECTIVE husband at bedside. Patient signing consent.  OBJECTIVE Most recent Vital Signs: Filed Vitals:   04/11/12 2000 04/11/12 2347 04/12/12 0400 04/12/12 0800  BP: 119/67 117/64 118/59 115/59  Pulse: 66 65 58 55  Temp: 98.4 F (36.9 C) 98.4 F (36.9 C) 98.2 F (36.8 C) 97.7 F (36.5 C)  TempSrc:  Oral Oral   Resp: 18 18 18 18  Height:      Weight:   64.6 kg (142 lb 6.7 oz)   SpO2: 97% 94% 93% 95%   CBG (last 3)  No results found for this basename: GLUCAP:3 in the last 72 hours  IV Fluid Intake:     MEDICATIONS     . aspirin  325 mg Oral Daily  . atorvastatin  40 mg Oral q1800  . buPROPion  150 mg Oral Daily  . gabapentin  100 mg Oral TID  . nicotine  14 mg Transdermal Daily  . pantoprazole  40 mg Oral Daily  . sodium chloride  3 mL Intravenous Q12H  . sodium chloride  3 mL Intravenous Q12H   PRN:  sodium chloride, HYDROcodone-acetaminophen, sodium chloride  Diet:  NPO for TEE Activity:  Up with assistance DVT Prophylaxis:  SCDs  CLINICALLY SIGNIFICANT STUDIES Basic Metabolic Panel:   Lab 04/12/12 0428 04/11/12 0610  NA 143 143  K 3.3* 3.6  CL 107 108  CO2 27 25  GLUCOSE 84 100*  BUN 18 13  CREATININE 0.71 0.62  CALCIUM 9.2 9.0  MG -- --  PHOS -- --   Liver Function Tests:   Lab 04/10/12 1947  AST 11  ALT 8  ALKPHOS 88  BILITOT 0.3  PROT 6.4  ALBUMIN 3.4*   CBC:   Lab 04/11/12 0610 04/10/12 1947  WBC 9.3 9.3  NEUTROABS -- 5.2  HGB 11.5* 12.5  HCT 35.5* 37.8  MCV 92.4 92.2  PLT 241 272   Coagulation:   Lab 04/10/12 1947  LABPROT  12.7  INR 0.96   Cardiac Enzymes: No results found for this basename: CKTOTAL:3,CKMB:3,CKMBINDEX:3,TROPONINI:3 in the last 168 hours Urinalysis:   Lab 04/10/12 2139  COLORURINE YELLOW  LABSPEC 1.013  PHURINE 7.0  GLUCOSEU NEGATIVE  HGBUR NEGATIVE  BILIRUBINUR NEGATIVE  KETONESUR NEGATIVE  PROTEINUR NEGATIVE  UROBILINOGEN 1.0  NITRITE NEGATIVE  LEUKOCYTESUR NEGATIVE   Lipid Panel    Component Value Date/Time   CHOL 123 04/11/2012 0610   TRIG 166* 04/11/2012 0610   HDL 38* 04/11/2012 0610   CHOLHDL 3.2 04/11/2012 0610   VLDL 33 04/11/2012 0610   LDLCALC 52 04/11/2012 0610   HgbA1C  Lab Results  Component Value Date   HGBA1C 5.9* 04/11/2012    Urine Drug Screen:   No results found for this basename: labopia,  cocainscrnur,  labbenz,  amphetmu,  thcu,  labbarb    Alcohol Level: No results found for this basename: ETH:2 in the last 168 hours  Ct Head Wo Contrast 04/10/2012  IMPRESSION:  1.  No acute abnormality. 2.  Minimal atrophy and minimal chronic small vessel white matter ischemic changes   in both cerebral hemispheres.    Mr Mra Head Wo Contrast 04/11/2012   IMPRESSION: 3.3 x 1.2 mm aneurysm arising from the right internal carotid artery cavernous segment directed laterally.  Hypoplastic irregular narrowed A1 segment of the left anterior cerebral artery.  Azygos configuration of the A2 segment of the anterior cerebral artery.  Question of complex aneurysm at the left middle cerebral artery bifurcation from which vessels arise measuring up to 3.5 mm.  Mild to moderate middle cerebral artery branch vessel irregularity.  Poor delineation AICAs.  Mild irregularity and narrowing of the posterior cerebral arteries greater on the left.    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  Hypoplastic irregular narrowed A1 segment of the left anterior cerebral artery.  Azygos  configuration of the A2 segment of the anterior cerebral artery.  Question of complex aneurysm at the left middle cerebral artery bifurcation from which vessels arise measuring up to 3.5 mm.  Mild to moderate middle cerebral artery branch vessel irregularity.  Poor delineation AICAs.  Mild irregularity and narrowing of the posterior cerebral arteries greater on the left.    2D Echocardiogram  EF 55-60% LA mildly dilated.  Carotid Doppler  Right: No evidence of hemodynamically significant internal carotid artery stenosis. Left: 60-79% ICA stenosis. Bilateral: Vertebral artery flow is antegrade.   EKG - Pending  Therapy Recommendations - OP PT  Physical Exam   Pleasant elderly caucasian lady not in distress..Awake alert. Afebrile. Head is nontraumatic. Neck is supple without bruit. Hearing is normal. Cardiac exam no murmur or gallop. Lungs are clear to auscultation. Distal pulses are well felt.  Neurological Exam ; Awake  Alert oriented x 3. Normal speech and language.eye movements full without nystagmus. Face symmetric. Tongue midline. Normal strength except for mild right grip weakness and diminished fine finger movements on the right. Orbits left over right approximately. Normal tone, reflexes and coordination. Normal sensation. Gait deferred.   ASSESSMENT [name] is a 71 y.o. female presenting with right upper extremity weakness and numbness.  Imaging confirmed acute non-hemorrhagic infarcts scattered throughout the left hemisphere consistent with an embolic etiology. Embolic etiology unknown. Could be associated with L ICA stenosis and artery to artery emboli. On aspirin 81 mg orally every day prior to admission. Now on aspirin 325 mg orally every day for secondary stroke prevention. Patient with resultant right upper extremity hemiparesis and numbness..   2 small cerebral aneurysms noted on MRA.the risk outweighed the possible benefits of treatment. At any rate this issue could not  be addressed at this time due to her recent CVA.  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  Followup MRA in 3 months to assess aneurysms TEE today to look for embolic source. Arranged with [facility]. If positive for PFO (patent foramen ovale), check bilateral lower extremity venous dopplers to rule out DVT as possible source of stroke. VVS consult for L ICA stenosis. Can do as an OP if discharge planned for today Ok for discharge after TEE from neuro standpoint  Follow up with Dr. Laquandra Carrillo, Stroke Clinic, in 2 months.   SHARON BIBY, MSN, RN, ANVP-BC, ANP-BC, GNP-BC [facility] Pager: [phone] 04/12/2012   9:23 AM  I have personally obtained a history, examined the patient, evaluated imaging results, and formulated the assessment and plan of care. I agree with the above.  Bryley Chrisman, MD Medical Director [facility] Pager: [phone] 04/12/2012 9:23 AM   

## 2012-04-12 NOTE — Consult Note (Signed)
[facility] [place] CONSULT NOTE 04/12/2012 DOB: [id number] MRN : [id number]  BJ:YNWG carotid stenosis, left brain stroke Referring Physician:Estela Isaiah Blakes, MD   History of Present Illness: 71 yo female with subjective rt hand weakness for the most of the day today without any other neurological deficits except a headache that started the last several hours in the ed relieveing with tylenol. No recent illnesses. No fevers. No n/v/d. No cp no sob no le weakness. No pain in arms. No abd pain. No rashes. Asked to admit for concern of cva. No focal neuro deficits on exam. None noted by her husband or dtr who are both Present.    Right: No evidence of hemodynamically significant internal carotid artery stenosis. Left: 60-79% ICA stenosis. Bilateral: Vertebral artery flow is antegrade.  Was seen on ultrasound and we are being consul;ted for left carotid stenosis.          Past Medical History  Diagnosis Date  . Mitral valve regurgitation     trace  . Hypertension   . Hypercholesteremia   . Coronary artery disease   . Arthritis   . H/O hiatal hernia   . Stroke     Past Surgical History  Procedure Date  . Cardiac catheterization 1994  . Back surgery     rods, fusion, three surgery  . Hysterotomy   . Carpal tunnel release     left  . Hand surgery     bilateral  . Cholecystectomy      ROS: [x]  Positive  [ ]  Denies    General: [ ]  Weight loss, [ ]  Fever, [ ]  chills Neurologic: [ ]  Dizziness, [ ]  Blackouts, [ ]  Seizure [ ]  Stroke, [ ]  "Mini stroke", [ ]  Slurred speech, [ ]  Temporary blindness; [ ]  weakness in arms or legs, [ ]  Hoarseness Cardiac: [ ]  Chest pain/pressure, [ ]  Shortness of breath at rest [ ]  Shortness of breath with exertion, [ ]  Atrial fibrillation or irregular heartbeat Vascular: [ ]  Pain in legs with walking, [ ]  Pain in legs at rest, [ ]  Pain in legs at night,  [ ]  Non-healing ulcer, [ ]  Blood clot in vein/DVT,   Pulmonary: [  ] Home oxygen, [ ]  Productive cough, [ ]  Coughing up blood, [ ]  Asthma,  [ ]  Wheezing Musculoskeletal:  [ ]  Arthritis, [ ]  Low back pain, [ ]  Joint pain Hematologic: [ ]  Easy Bruising, [ ]  Anemia; [ ]  Hepatitis Gastrointestinal: [ ]  Blood in stool, [ ]  Gastroesophageal Reflux/heartburn, [ ]  Trouble swallowing Urinary: [ ]  chronic Kidney disease, [ ]  on HD - [ ]  MWF or [ ]  TTHS, [ ]  Burning with urination, [ ]  Difficulty urinating Skin: [ ]  Rashes, [ ]  Wounds Psychological: [ ]  Anxiety, [ ]  Depression  Social History History  Substance Use Topics  . Smoking status: Current Every Day Smoker -- 0.5 packs/day for 60 years    Types: Cigarettes  . Smokeless tobacco: Not on file  . Alcohol Use: No    Family History Family History  Problem Relation Age of Onset  . Heart attack Father     Allergies  Allergen Reactions  . Percocet (Oxycodone-Acetaminophen) Rash  . Hydrochlorothiazide   . Indomethacin   . Paroxetine Hcl   . Penicillins   . Pravachol   . Quinine Derivatives   . Wellbutrin (Bupropion)     agitation  . Zocor (Simvastatin - High Dose) Other (See Comments)    Weakness/no  energy    Current Facility-Administered Medications  Medication Dose Route Frequency Provider Last Rate Last Dose  . 0.9 %  sodium chloride infusion  250 mL Intravenous PRN Stephani Police, PA      . aspirin tablet 325 mg  325 mg Oral Daily David L Rinehuls, PA-C   325 mg at 04/12/12 1024  . atorvastatin (LIPITOR) tablet 40 mg  40 mg Oral q1800 Tarry Kos, MD   40 mg at 04/12/12 1716  . buPROPion (WELLBUTRIN XL) 24 hr tablet 150 mg  150 mg Oral Daily Stephani Police, PA   150 mg at 04/12/12 1025  . gabapentin (NEURONTIN) capsule 100 mg  100 mg Oral TID Joseph Art, DO   100 mg at 04/12/12 1025  . HYDROcodone-acetaminophen (NORCO/VICODIN) 5-325 MG per tablet 1 tablet  1 tablet Oral Q4H PRN Joseph Art, DO   1 tablet at 04/12/12 1716  . nicotine (NICODERM CQ - dosed in mg/24 hours) patch 14 mg   14 mg Transdermal Daily Stephani Police, PA   14 mg at 04/12/12 1025  . pantoprazole (PROTONIX) EC tablet 40 mg  40 mg Oral Daily Caroline More, NP   40 mg at 04/12/12 1025  . sodium chloride 0.9 % injection 3 mL  3 mL Intravenous Q12H Tarry Kos, MD   3 mL at 04/12/12 1028  . sodium chloride 0.9 % injection 3 mL  3 mL Intravenous Q12H Tarry Kos, MD   3 mL at 04/11/12 0016  . sodium chloride 0.9 % injection 3 mL  3 mL Intravenous PRN Tarry Kos, MD          Significant Diagnostic Studies: CBC Lab Results  Component Value Date   WBC 9.3 04/11/2012   HGB 11.5* 04/11/2012   HCT 35.5* 04/11/2012   MCV 92.4 04/11/2012   PLT 241 04/11/2012    BMET    Component Value Date/Time   NA 143 04/12/2012 0428   K 3.3* 04/12/2012 0428   CL 107 04/12/2012 0428   CO2 27 04/12/2012 0428   GLUCOSE 84 04/12/2012 0428   BUN 18 04/12/2012 0428   CREATININE 0.71 04/12/2012 0428   CALCIUM 9.2 04/12/2012 0428   GFRNONAA 84* 04/12/2012 0428   GFRAA >90 04/12/2012 0428    COAG Lab Results  Component Value Date   INR 0.96 04/10/2012   No results found for this basename: PTT     Physical Examination BP Readings from Last 3 Encounters:  04/12/12 103/52  04/12/12 103/52  03/14/12 138/80   Temp Readings from Last 3 Encounters:  04/12/12 97.4 F (36.3 C)   04/12/12 97.4 F (36.3 C)    SpO2 Readings from Last 3 Encounters:  04/12/12 99%  04/12/12 99%   Pulse Readings from Last 3 Encounters:  04/12/12 65  04/12/12 65  03/14/12 76    General:  WDWN in NAD Gait: Normal HENT: WNL Eyes: Pupils equal Pulmonary: normal non-labored breathing , without Rales, rhonchi,  wheezing Cardiac: RRR, without  Murmurs, rubs or gallops; No carotid bruits Abdomen: soft, NT, no masses Skin: no rashes, ulcers noted Vascular Exam/Pulses:palpable radial and PT/DP pulses equal bilaterally.  Patient reports tenderness to palpation left lateral neck.  Extremities without ischemic changes, no  Gangrene , no cellulitis; no open wounds;  Musculoskeletal: no muscle wasting or atrophy  Neurologic: A&O X 3; Appropriate Affect ; Awake Alert oriented x 3. Normal speech and language.eye movements full without nystagmus. Face symmetric. Tongue midline.  Normal  strength except for mild right grip weakness and diminished fine finger movements on the right. Orbits left over right approximately. Normal tone, reflexes and coordination. Normal sensation.   SENSATION: normal; MOTOR FUNCTION: Pt has good and not equal strength in all extremities - 5/5 left  4/5 on the right hand, triceps and shoulder Speech is fluent/normal  Non-Invasive Vascular Imaging: Right: No evidence of hemodynamically significant internal carotid artery stenosis. Left: 60-79% ICA stenosis. Bilateral: Vertebral artery flow is antegrade.    ASSESSMENT/PLAN: Left carotid stenosis. Right upper extremity weakness.  No other symptoms of CVA. I will discuss plan of care with Dr. Fayne Mediate, EMMA [facility] PA-C  I have examined the patient, reviewed and agree with above. The patient was admitted with clumsiness in her right arm and hand. She had difficulty writing. She denies any other focal neurologic deficits. She denies any other prior neurologic deficits. Workup to include MR I have her brain showed that she did have appeared to be multiple small emboli to her left brain resulting in stroke. The MR I also suggested small bilateral intracranial aneurysms. Her duplex of her carotid arteries showed no evidence of right carotid stenosis in the upper end of the 60-79% left internal carotid artery stenosis. Cardiac evaluation has been negative for embolic source.  Interesting on physical exam the patient does have tenderness in her left carotid area and also reports soreness in her left carotid. She also has a left temporal headache. I explained this is quite unusual is a presentation of carotid disease and may be incidental. I have  recommended a CT angiogram of her neck for further evaluation of this and the degree of stenosis.  The carotid stenosis does appear to be the most likely cause of her symptoms currently. I agree with a stroke service that she can be discharged and that would recommend carotid endarterectomy in a short interval in several weeks. I have ordered a CT angiogram until that she would be okay for discharge after this is been accomplished and reviewed. The patient understands critical importance of notifying us if she should develop any new focal deficits. Mikias Lanz, MD 04/12/2012 7:43 PM

## 2012-04-12 NOTE — Interval H&P Note (Signed)
History and Physical Interval Note:  04/12/2012 2:40 PM  Sara [name]  has presented today for surgery, with the diagnosis of cva  The various methods of treatment have been discussed with the patient and family. After consideration of risks, benefits and other options for treatment, the patient has consented to  Procedure(s) (LRB) with comments: TRANSESOPHAGEAL ECHOCARDIOGRAM (TEE) (N/A) as a surgical intervention .  The patient's history has been reviewed, patient examined, no change in status, stable for surgery.  I have reviewed the patient's chart and labs.  Questions were answered to the patient's satisfaction.     Olga Millers

## 2012-04-12 NOTE — Evaluation (Signed)
Occupational Therapy Evaluation Patient Details Name: Sara [name] MRN: [id number] DOB: January 11, 1941 Today's Date: 04/12/2012 Time: 1115-1200 OT Time Calculation (min): 45 min  OT Assessment / Plan / Recommendation Clinical Impression  Pt admitted for L CVA resulting in R side weakness and coordination deficits.  All limitations listed below.  Pt would benefit from cont OT to increase I with basic adls and increase use of RUE.    OT Assessment  Patient needs continued OT Services    Follow Up Recommendations  Outpatient OT    Barriers to Discharge None    Equipment Recommendations  None recommended by OT    Recommendations for Other Services    Frequency  Min 2X/week    Precautions / Restrictions Precautions Precautions: Fall Restrictions Weight Bearing Restrictions: No   Pertinent Vitals/Pain PT c/o mild headache.    ADL  Eating/Feeding: Simulated;Set up Where Assessed - Eating/Feeding: Chair Grooming: Performed;Teeth care;Wash/dry hands;Wash/dry face;Brushing hair;Supervision/safety Where Assessed - Grooming: Unsupported standing Upper Body Bathing: Simulated;Set up Where Assessed - Upper Body Bathing: Unsupported sitting Lower Body Bathing: Simulated;Minimal assistance Where Assessed - Lower Body Bathing: Unsupported sit to stand Upper Body Dressing: Performed;Set up Where Assessed - Upper Body Dressing: Unsupported sitting Lower Body Dressing: Performed;Minimal assistance Where Assessed - Lower Body Dressing: Unsupported sit to stand Toilet Transfer: Performed;Min guard Toilet Transfer Method: Sit to Barista: Comfort height toilet;Grab bars Toileting - Clothing Manipulation and Hygiene: Simulated;Min guard Where Assessed - Toileting Clothing Manipulation and Hygiene: Standing Transfers/Ambulation Related to ADLs: Pt walked in hallway and in room with S. Pt did not lose balance but did tend to drag R foot just a bit. ADL Comments: pt has  diffiulty with fastners b/c of decreased fine motor function in RUE    OT Diagnosis: Generalized weakness  OT Problem List: Decreased strength;Decreased coordination;Impaired UE functional use OT Treatment Interventions: Self-care/ADL training;Therapeutic activities   OT Goals Acute Rehab OT Goals OT Goal Formulation: With patient/family Time For Goal Achievement: 04/19/12 Potential to Achieve Goals: Good ADL Goals Pt Will Perform Eating: with modified independence;Other (comment) (using RUE as much as possible.) ADL Goal: Eating - Progress: Goal set today Pt Will Perform Grooming: with modified independence;Other (comment) (using RUE as much as possible.) ADL Goal: Grooming - Progress: Goal set today Additional ADL Goal #1: PT will complete all aspects of toileting with mod I. ADL Goal: Additional Goal #1 - Progress: Goal set today Arm Goals Pt Will Complete Theraputty Exer: with supervision, verbal cues required/provided;Min resistance putty;Right upper extremity Arm Goal: Theraputty Exercises - Progress: Goal set today  Visit Information  Last OT Received On: 04/12/12 Assistance Needed: +1    Subjective Data  Subjective: "I just worry about this arm. Patient Stated Goal: to go home todya.   Prior Functioning     Home Living Lives With: Spouse Available Help at Discharge: Family Type of Home: House Home Access: Level entry Home Layout: One level Bathroom Shower/Tub: Tub/shower unit;Door Dentist: None Prior Function Level of Independence: Independent Able to Take Stairs?: Yes Driving: Yes Vocation: Retired Musician: No difficulties Dominant Hand: Right         Vision/Perception Vision - Assessment Eye Alignment: Within Functional Limits Vision Assessment: Vision tested Tracking/Visual Pursuits: Able to track stimulus in all quads without difficulty Visual Fields: No apparent deficits    Cognition  Overall Cognitive Status: Appears within functional limits for tasks assessed/performed Arousal/Alertness: Awake/alert Orientation Level: Appears intact for tasks assessed Behavior  During Session: [facility] for tasks performed    Extremity/Trunk Assessment Right Upper Extremity Assessment RUE ROM/Strength/Tone: Deficits RUE ROM/Strength/Tone Deficits: Pts R grip 3+/5, otherwise WFL. RUE Sensation: Deficits RUE Sensation Deficits: pt reports numbness in RUE RUE Coordination: Deficits RUE Coordination Deficits: Pt slow with finger opposition and with managing small objects and fasterners. Left Upper Extremity Assessment LUE ROM/Strength/Tone: Within functional levels LUE Sensation: WFL - Light Touch LUE Coordination: WFL - gross/fine motor Trunk Assessment Trunk Assessment: Normal     Mobility Bed Mobility Bed Mobility: Sitting - Scoot to Edge of Bed;Supine to Sit Supine to Sit: 5: Supervision;HOB flat Sitting - Scoot to Edge of Bed: 5: Supervision Sit to Supine: 5: Supervision Details for Bed Mobility Assistance: safe mobility Transfers Transfers: Sit to Stand;Stand to Sit Sit to Stand: 5: Supervision;With upper extremity assist;From bed Stand to Sit: 5: Supervision;With upper extremity assist;To bed Details for Transfer Assistance: no assist needed     Shoulder Instructions     Exercise Hand Exercises Wrist Flexion: AROM;5 reps;Strengthening Wrist Extension: AROM;5 reps;Strengthening Digit Composite Flexion: AROM;5 reps;Squeeze ball Composite Extension: AROM;5 reps;Squeeze ball Opposition: AROM;10 reps (used puddy and retrieved objects from puddy)   Balance Balance Balance Assessed: Yes   End of Session OT - End of Session Activity Tolerance: Patient tolerated treatment well Patient left: in chair;with call bell/phone within reach;with family/visitor present Nurse Communication: Mobility status  GO     Hope Budds 04/12/2012, 12:17 PM [phone]

## 2012-04-12 NOTE — Care Management Note (Signed)
    Page 1 of 1   04/12/2012     12:44:55 PM   CARE MANAGEMENT NOTE 04/12/2012  Patient:  Sara [name], Sara [name]   Account Number:  [id number]  Date Initiated:  04/12/2012  Documentation initiated by:  Donn Pierini  Subjective/Objective Assessment:   Pt admitted with right sided weakness- MRI + for CVA     Action/Plan:   PTA pt lived at home with spouse- PT/OT/ST evals   Anticipated DC Date:  04/12/2012   Anticipated DC Plan:  HOME/SELF CARE      DC Planning Services  CM consult  OP Neuro Rehab      Choice offered to / List presented to:             Status of service:  Completed, signed off Medicare Important Message given?   (If response is "NO", the following Medicare IM given date fields will be blank) Date Medicare IM given:   Date Additional Medicare IM given:    Discharge Disposition:  HOME/SELF CARE  Per UR Regulation:  Reviewed for med. necessity/level of care/duration of stay  If discussed at Long Length of Stay Meetings, dates discussed:    Comments:  04/12/12- 1130- Donn Pierini RN, BSN [phone] Pt for possible d/c today- recommendations for outpt PT/OT- spoke with pt at bedside- pt agreeable to outpt therapy and has transportation to services. MD order for Neuro outpt PT/OT- referral faxed to [facility] Outpt Neuro Rehab.

## 2012-04-12 NOTE — H&P (View-Only) (Signed)
Stroke Team Progress Note  HISTORY  Sara [name] is a 71 y.o. female who noticed that she was having trouble writing earlier today 04/10/2012. She waited until her husband came home and he insisted she come to the ER. She has a persistent problem. She has some numbness associated. She was not given TPA due to pt being out of therapeutic window. She was admitted for further evaluation and treatment.   SUBJECTIVE husband at bedside. Patient signing consent.  OBJECTIVE Most recent Vital Signs: Filed Vitals:   04/11/12 2000 04/11/12 2347 04/12/12 0400 04/12/12 0800  BP: 119/67 117/64 118/59 115/59  Pulse: 66 65 58 55  Temp: 98.4 F (36.9 C) 98.4 F (36.9 C) 98.2 F (36.8 C) 97.7 F (36.5 C)  TempSrc:  Oral Oral   Resp: 18 18 18 18   Height:      Weight:   64.6 kg (142 lb 6.7 oz)   SpO2: 97% 94% 93% 95%   CBG (last 3)  No results found for this basename: GLUCAP:3 in the last 72 hours  IV Fluid Intake:     MEDICATIONS     . aspirin  325 mg Oral Daily  . atorvastatin  40 mg Oral q1800  . buPROPion  150 mg Oral Daily  . gabapentin  100 mg Oral TID  . nicotine  14 mg Transdermal Daily  . pantoprazole  40 mg Oral Daily  . sodium chloride  3 mL Intravenous Q12H  . sodium chloride  3 mL Intravenous Q12H   PRN:  sodium chloride, HYDROcodone-acetaminophen, sodium chloride  Diet:  NPO for TEE Activity:  Up with assistance DVT Prophylaxis:  SCDs  CLINICALLY SIGNIFICANT STUDIES Basic Metabolic Panel:   Lab 04/12/12 0428 04/11/12 0610  NA 143 143  K 3.3* 3.6  CL 107 108  CO2 27 25  GLUCOSE 84 100*  BUN 18 13  CREATININE 0.71 0.62  CALCIUM 9.2 9.0  MG -- --  PHOS -- --   Liver Function Tests:   Lab 04/10/12 1947  AST 11  ALT 8  ALKPHOS 88  BILITOT 0.3  PROT 6.4  ALBUMIN 3.4*   CBC:   Lab 04/11/12 0610 04/10/12 1947  WBC 9.3 9.3  NEUTROABS -- 5.2  HGB 11.5* 12.5  HCT 35.5* 37.8  MCV 92.4 92.2  PLT 241 272   Coagulation:   Lab 04/10/12 1947  LABPROT  12.7  INR 0.96   Cardiac Enzymes: No results found for this basename: CKTOTAL:3,CKMB:3,CKMBINDEX:3,TROPONINI:3 in the last 168 hours Urinalysis:   Lab 04/10/12 2139  COLORURINE YELLOW  LABSPEC 1.013  PHURINE 7.0  GLUCOSEU NEGATIVE  HGBUR NEGATIVE  BILIRUBINUR NEGATIVE  KETONESUR NEGATIVE  PROTEINUR NEGATIVE  UROBILINOGEN 1.0  NITRITE NEGATIVE  LEUKOCYTESUR NEGATIVE   Lipid Panel    Component Value Date/Time   CHOL 123 04/11/2012 0610   TRIG 166* 04/11/2012 0610   HDL 38* 04/11/2012 0610   CHOLHDL 3.2 04/11/2012 0610   VLDL 33 04/11/2012 0610   LDLCALC 52 04/11/2012 0610   HgbA1C  Lab Results  Component Value Date   HGBA1C 5.9* 04/11/2012    Urine Drug Screen:   No results found for this basename: labopia,  cocainscrnur,  labbenz,  amphetmu,  thcu,  labbarb    Alcohol Level: No results found for this basename: ETH:2 in the last 168 hours  Ct Head Wo Contrast 04/10/2012  IMPRESSION:  1.  No acute abnormality. 2.  Minimal atrophy and minimal chronic small vessel white matter ischemic changes  in both cerebral hemispheres.    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  Hypoplastic irregular narrowed A1 segment of the left anterior cerebral artery.  Azygos configuration of the A2 segment of the anterior cerebral artery.  Question of complex aneurysm at the left middle cerebral artery bifurcation from which vessels arise measuring up to 3.5 mm.  Mild to moderate middle cerebral artery branch vessel irregularity.  Poor delineation AICAs.  Mild irregularity and narrowing of the posterior cerebral arteries greater on the left.    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  Hypoplastic irregular narrowed A1 segment of the left anterior cerebral artery.  Azygos  configuration of the A2 segment of the anterior cerebral artery.  Question of complex aneurysm at the left middle cerebral artery bifurcation from which vessels arise measuring up to 3.5 mm.  Mild to moderate middle cerebral artery branch vessel irregularity.  Poor delineation AICAs.  Mild irregularity and narrowing of the posterior cerebral arteries greater on the left.    2D Echocardiogram  EF 55-60% LA mildly dilated.  Carotid Doppler  Right: No evidence of hemodynamically significant internal carotid artery stenosis. Left: 60-79% ICA stenosis. Bilateral: Vertebral artery flow is antegrade.   EKG - Pending  Therapy Recommendations - OP PT  Physical Exam   Pleasant elderly caucasian lady not in distress..Awake alert. Afebrile. Head is nontraumatic. Neck is supple without bruit. Hearing is normal. Cardiac exam no murmur or gallop. Lungs are clear to auscultation. Distal pulses are well felt.  Neurological Exam ; Awake  Alert oriented x 3. Normal speech and language.eye movements full without nystagmus. Face symmetric. Tongue midline. Normal strength except for mild right grip weakness and diminished fine finger movements on the right. Orbits left over right approximately. Normal tone, reflexes and coordination. Normal sensation. Gait deferred.   ASSESSMENT [name] is a 71 y.o. female presenting with right upper extremity weakness and numbness.  Imaging confirmed acute non-hemorrhagic infarcts scattered throughout the left hemisphere consistent with an embolic etiology. Embolic etiology unknown. Could be associated with L ICA stenosis and artery to artery emboli. On aspirin 81 mg orally every day prior to admission. Now on aspirin 325 mg orally every day for secondary stroke prevention. Patient with resultant right upper extremity hemiparesis and numbness..   2 small cerebral aneurysms noted on MRA.the risk outweighed the possible benefits of treatment. At any rate this issue could not  be addressed at this time due to her recent CVA.  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  Followup MRA in 3 months to assess aneurysms TEE today to look for embolic source. Arranged with [facility]. If positive for PFO (patent foramen ovale), check bilateral lower extremity venous dopplers to rule out DVT as possible source of stroke. VVS consult for L ICA stenosis. Can do as an OP if discharge planned for today Ok for discharge after TEE from neuro standpoint  Follow up with Dr. Pearlean Brownie, Stroke Clinic, in 2 months.   Annie Main, MSN, RN, ANVP-BC, ANP-BC, GNP-BC [facility] Pager: [phone] 04/12/2012  9:23 AM  I have personally obtained a history, examined the patient, evaluated imaging results, and formulated the assessment and plan of care. I agree with the above.  Delia Heady, MD Medical Director [facility] Pager: [phone] 04/12/2012 9:23 AM

## 2012-04-12 NOTE — Progress Notes (Signed)
  Echocardiogram Echocardiogram Transesophageal has been performed.  Sara [name] 04/12/2012, 4:21 PM

## 2012-04-12 NOTE — Progress Notes (Addendum)
[facility] PROGRESS NOTE  Sara [name] MVH:[id number] DOB: 06-May-1940 DOA: 04/10/2012 PCP: Sheila Oats, MD  Assessment/Plan  Right upper extremity weakness Acute non hemorrhagic infarcts scattered throughout the left hemisphere - source? Stroke team consulted.  We will follow their recommendations TEE pending for today to evaluate for embolic source. ASA 325 mg daily for secondary stroke prophylaxis. Carotid US shows 60-79% left ICA stenosis: will ask for a vascular surgery consult.  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   ( Dr. Dutch Quint) No treatment recommended at this time.  Peripheral neuropathy On Neurontin and Skelaxin at home, currently being held to accurately diagnose neurological changes.  Hypertension/Hypotension Hypotensive overnight.  Responded well to fluids.  BP medications (metoprolol and amlodipine) are held.  Bradycardia In the upper 40s and 50s Appears asymptomatic.  Metoprolol held. Could be associated with acute stroke  Coronary artery disease On 81 mg aspirin and Lipitor  Hypercholesterolemia On Lipitor  Tobacco abuse Half pack a day.  Smoking cessation counseling.  Offer nicotine patch  Code Status:  Presumed full code Family Communication: husband and daughter at bedside  Disposition Plan: Await results of TEE. Possibly home later today vs tomorrow depending on results.   Consultants: Stroke service    HPI/Subjective: 71 yo female with a history of mitral Valve regurgitation, hypertension, hypercholesterolemia, and coronary artery disease, presents to the emergency department with a chief complaint of rt hand weakness for the most of the day today.  She is without any other neurological deficits except a headache that started the last several hours in the ed relieveing with tylenol. No recent  illnesses. No fevers. No n/v/d. No cp no sob no le weakness. No pain in arms. No abd pain. No rashes. Asked to admit for concern of cva.   Objective: Filed Vitals:   04/11/12 2000 04/11/12 2347 04/12/12 0400 04/12/12 0800  BP: 119/67 117/64 118/59 115/59  Pulse: 66 65 58 55  Temp: 98.4 F (36.9 C) 98.4 F (36.9 C) 98.2 F (36.8 C) 97.7 F (36.5 C)  TempSrc:  Oral Oral   Resp: 18 18 18 18   Height:      Weight:   64.6 kg (142 lb 6.7 oz)   SpO2: 97% 94% 93% 95%    Intake/Output Summary (Last 24 hours) at 04/12/12 1017 Last data filed at 04/11/12 1700  Gross per 24 hour  Intake      0 ml  Output      3 ml  Net     -3 ml   Filed Weights   04/10/12 2315 04/12/12 0400  Weight: 61.644 kg (135 lb 14.4 oz) 64.6 kg (142 lb 6.7 oz)    Exam:  General:  Alert and oriented. No current complaints. HEENT: No bruits, no goiter. Cardiovascular: sinus bradycardia on telemetry, no murmurs rubs or gallops, no lower extremity edema Respiratory: clear to auscultation no wheezes crackles or rales Abdomen: soft nontender nondistended, with positive bowel sounds, no masses  Data Reviewed: Basic Metabolic Panel:  Lab 04/12/12 8413 04/11/12 0610 04/10/12 1947  NA 143 143 141  K 3.3* 3.6 4.1  CL 107 108 106  CO2 27 25 25   GLUCOSE 84 100* 102*  BUN 18 13 20   CREATININE 0.71 0.62 0.78  CALCIUM 9.2 9.0 9.5  MG -- -- --  PHOS -- -- --   Liver Function Tests:  Lab 04/10/12 1947  AST 11  ALT 8  ALKPHOS 88  BILITOT 0.3  PROT 6.4  ALBUMIN 3.4*   CBC:  Lab 04/11/12 0610 04/10/12 1947  WBC 9.3 9.3  NEUTROABS -- 5.2  HGB 11.5* 12.5  HCT 35.5* 37.8  MCV 92.4 92.2  PLT 241 272     Studies: Ct Head Wo Contrast  04/10/2012  *RADIOLOGY REPORT*  Clinical Data: Right-sided weakness and upper extremity numbness.  CT HEAD WITHOUT CONTRAST  Technique:  Contiguous axial images were obtained from the base of the skull through the vertex without contrast.  Comparison: None.  Findings:  Minimally enlarged ventricles and subarachnoid spaces. Minimal patchy white matter low density in both cerebral hemispheres.  No intracranial hemorrhage, mass lesion or CT evidence of acute infarction.  Bilateral cavernous internal carotid artery atheromatous calcifications.  Unremarkable bones and included paranasal sinuses.  IMPRESSION:  1.  No acute abnormality. 2.  Minimal atrophy and minimal chronic small vessel white matter ischemic changes in both cerebral hemispheres.   Original Report Authenticated By: Beckie Salts, M.D.    Mr [facility] Wo Contrast  04/11/2012  *RADIOLOGY REPORT*  Clinical Data:  Right hand weakness.  Hypertensive with hypercholesterolemia.  MRI BRAIN WITHOUT CONTRAST MRA HEAD WITHOUT CONTRAST  Technique: Multiplanar, multiecho pulse sequences of the brain and surrounding structures were obtained according to standard protocol without intravenous contrast.  Angiographic images of the head were obtained using MRA technique without contrast.  Comparison: 04/10/2012.  MRI HEAD  Findings:  Acute non hemorrhagic infarcts scattered throughout the left hemisphere involving portions of the left frontal lobe (including the left caudate body), left parietal lobe, left temporal lobe and left thalamus.  No intracranial hemorrhage.  Minimal small vessel disease type changes.  No hydrocephalus.  No intracranial mass lesion detected on this unenhanced exam.  Major intracranial vascular structures are patent.  IMPRESSION: Acute non hemorrhagic infarcts scattered throughout the left hemisphere as noted above.  MRA HEAD  Findings: Ectasia and irregularity involving portions of the pre cavernous, cavernous and supraclinoid segment of the internal carotid artery bilaterally without high-grade stenosis.  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  Hypoplastic irregular narrowed A1 segment of the left anterior cerebral artery.  Azygos  configuration of the A2 segment of the anterior cerebral artery.  Question of complex aneurysm at the left middle cerebral artery bifurcation from which vessels arise measuring up to 3.5 mm.  Mild to moderate middle cerebral artery branch vessel irregularity.  Codominant vertebral arteries without significant narrowing.  No significant stenosis of the basilar artery.  Poor delineation AICAs.  Mild irregularity and narrowing of the posterior cerebral arteries greater on the left.  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  Hypoplastic irregular narrowed A1 segment of the left anterior cerebral artery.  Azygos configuration of the A2 segment of the anterior cerebral artery.  Question of complex aneurysm at the left middle cerebral artery bifurcation from which vessels arise measuring up to 3.5 mm.  Mild to moderate middle cerebral artery branch vessel irregularity.  Poor delineation AICAs.  Mild irregularity and narrowing of the posterior cerebral arteries greater on the left.  This has been made a PRA call report utilizing dashboard call feature.   Original Report Authenticated By: Lacy Duverney, M.D.    Mr Brain Wo Contrast  04/11/2012  *RADIOLOGY REPORT*  Clinical Data:  Right hand weakness.  Hypertensive with hypercholesterolemia.  MRI BRAIN WITHOUT CONTRAST MRA HEAD WITHOUT CONTRAST  Technique: Multiplanar, multiecho pulse sequences of the brain and surrounding structures were obtained according to standard protocol without intravenous contrast.  Angiographic images of the head were obtained using MRA technique without contrast.  Comparison: 04/10/2012.  MRI HEAD  Findings:  Acute non hemorrhagic infarcts scattered throughout the left hemisphere involving portions of the left frontal lobe (including the left caudate body), left parietal lobe, left temporal lobe and left thalamus.  No intracranial hemorrhage.  Minimal small vessel  disease type changes.  No hydrocephalus.  No intracranial mass lesion detected on this unenhanced exam.  Major intracranial vascular structures are patent.  IMPRESSION: Acute non hemorrhagic infarcts scattered throughout the left hemisphere as noted above.  MRA HEAD  Findings: Ectasia and irregularity involving portions of the pre cavernous, cavernous and supraclinoid segment of the internal carotid artery bilaterally without high-grade stenosis.  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  Hypoplastic irregular narrowed A1 segment of the left anterior cerebral artery.  Azygos configuration of the A2 segment of the anterior cerebral artery.  Question of complex aneurysm at the left middle cerebral artery bifurcation from which vessels arise measuring up to 3.5 mm.  Mild to moderate middle cerebral artery branch vessel irregularity.  Codominant vertebral arteries without significant narrowing.  No significant stenosis of the basilar artery.  Poor delineation AICAs.  Mild irregularity and narrowing of the posterior cerebral arteries greater on the left.  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  Hypoplastic irregular narrowed A1 segment of the left anterior cerebral artery.  Azygos configuration of the A2 segment of the anterior cerebral artery.  Question of complex aneurysm at the left middle cerebral artery bifurcation from which vessels arise measuring up to 3.5 mm.  Mild to moderate middle cerebral artery branch vessel irregularity.  Poor delineation AICAs.  Mild irregularity and narrowing of the posterior cerebral arteries greater on the left.  This has been made a PRA call report utilizing dashboard call feature.   Original Report Authenticated By: Lacy Duverney, M.D.     Scheduled Meds:    . aspirin  325 mg Oral Daily  . atorvastatin  40 mg Oral q1800  . buPROPion   150 mg Oral Daily  . gabapentin  100 mg Oral TID  . nicotine  14 mg Transdermal Daily  . pantoprazole  40 mg Oral Daily  . sodium chloride  3 mL Intravenous Q12H  . sodium chloride  3 mL Intravenous Q12H   Continuous Infusions:   Principal Problem:  *CVA (cerebral infarction) Active Problems:  Hypertension  Hypercholesteremia  Coronary artery disease  Tobacco abuse  Right sided weakness  Aneurysm of right internal carotid artery    Time spent: 30 min.    Chaya Jan, MD  [facility] Pager [phone]. If 8PM-8AM, please contact night-coverage at [email or web address], password [facility] 04/12/2012, 10:17 AM  LOS: 2 days

## 2012-04-13 ENCOUNTER — Inpatient Hospital Stay (HOSPITAL_COMMUNITY): Payer: Medicare Other

## 2012-04-13 ENCOUNTER — Other Ambulatory Visit: Payer: Self-pay

## 2012-04-13 ENCOUNTER — Encounter (HOSPITAL_COMMUNITY): Payer: Self-pay | Admitting: Pharmacy Technician

## 2012-04-13 ENCOUNTER — Encounter (HOSPITAL_COMMUNITY): Payer: Self-pay | Admitting: Cardiology

## 2012-04-13 DIAGNOSIS — I6522 Occlusion and stenosis of left carotid artery: Secondary | ICD-10-CM

## 2012-04-13 DIAGNOSIS — I6529 Occlusion and stenosis of unspecified carotid artery: Secondary | ICD-10-CM

## 2012-04-13 MED ORDER — CLOPIDOGREL BISULFATE 75 MG PO TABS: 75.0000 mg | ORAL_TABLET | Freq: Every day | ORAL | Status: DC

## 2012-04-13 MED ORDER — CLOPIDOGREL BISULFATE 75 MG PO TABS
75.0000 mg | ORAL_TABLET | Freq: Every day | ORAL | Status: DC
Start: 2012-04-13 — End: 2012-04-13
  Administered 2012-04-13: 75 mg via ORAL
  Filled 2012-04-13: qty 1

## 2012-04-13 MED ORDER — IOHEXOL 350 MG/ML SOLN
50.0000 mL | Freq: Once | INTRAVENOUS | Status: AC | PRN
  Administered 2012-04-13: 50 mL via INTRAVENOUS

## 2012-04-13 MED ORDER — ASPIRIN 325 MG PO TABS: 325.0000 mg | ORAL_TABLET | Freq: Every day | ORAL | Status: DC

## 2012-04-13 MED ORDER — ASPIRIN 81 MG PO CHEW: 81.0000 mg | CHEWABLE_TABLET | Freq: Every day | ORAL | Status: DC

## 2012-04-13 NOTE — Progress Notes (Signed)
Stroke Team Progress Note  HISTORY  Sara [name] is a 71 y.o. female who noticed that she was having trouble writing earlier today 04/10/2012. She waited until her husband came home and he insisted she come to the ER. She has a persistent problem. She has some numbness associated. She was not given TPA due to pt being out of therapeutic window. She was admitted for further evaluation and treatment.  SUBJECTIVE Husband at bedside. Patient just got off the phone with vascular surgeon who wants to do surgery sooner rather than later.  OBJECTIVE Most recent Vital Signs: Filed Vitals:   04/12/12 1600 04/12/12 2000 04/13/12 0000 04/13/12 0400  BP: 103/52 136/74 112/66 116/72  Pulse: 65 63 57 57  Temp: 97.4 F (36.3 C) 97.9 F (36.6 C) 98 F (36.7 C) 98 F (36.7 C)  TempSrc:  Oral Oral Oral  Resp: 16 15 14 14   Height:      Weight:    62.007 kg (136 lb 11.2 oz)  SpO2: 99% 94% 93% 96%   CBG (last 3)  No results found for this basename: GLUCAP:3 in the last 72 hours  IV Fluid Intake:     MEDICATIONS     . aspirin  325 mg Oral Daily  . atorvastatin  40 mg Oral q1800  . buPROPion  150 mg Oral Daily  . gabapentin  100 mg Oral TID  . nicotine  14 mg Transdermal Daily  . pantoprazole  40 mg Oral Daily  . sodium chloride  3 mL Intravenous Q12H  . sodium chloride  3 mL Intravenous Q12H   PRN:  HYDROcodone-acetaminophen, sodium chloride  Diet:  Sodium Restricted  Activity:  Up with assistance DVT Prophylaxis:  SCDs  CLINICALLY SIGNIFICANT STUDIES Basic Metabolic Panel:   Lab 04/12/12 0428 04/11/12 0610  NA 143 143  K 3.3* 3.6  CL 107 108  CO2 27 25  GLUCOSE 84 100*  BUN 18 13  CREATININE 0.71 0.62  CALCIUM 9.2 9.0  MG -- --  PHOS -- --   Liver Function Tests:   Lab 04/10/12 1947  AST 11  ALT 8  ALKPHOS 88  BILITOT 0.3  PROT 6.4  ALBUMIN 3.4*   CBC:   Lab 04/11/12 0610 04/10/12 1947  WBC 9.3 9.3  NEUTROABS -- 5.2  HGB 11.5* 12.5  HCT 35.5* 37.8  MCV 92.4  92.2  PLT 241 272   Coagulation:   Lab 04/10/12 1947  LABPROT 12.7  INR 0.96   Cardiac Enzymes: No results found for this basename: CKTOTAL:3,CKMB:3,CKMBINDEX:3,TROPONINI:3 in the last 168 hours Urinalysis:   Lab 04/10/12 2139  COLORURINE YELLOW  LABSPEC 1.013  PHURINE 7.0  GLUCOSEU NEGATIVE  HGBUR NEGATIVE  BILIRUBINUR NEGATIVE  KETONESUR NEGATIVE  PROTEINUR NEGATIVE  UROBILINOGEN 1.0  NITRITE NEGATIVE  LEUKOCYTESUR NEGATIVE   Lipid Panel    Component Value Date/Time   CHOL 123 04/11/2012 0610   TRIG 166* 04/11/2012 0610   HDL 38* 04/11/2012 0610   CHOLHDL 3.2 04/11/2012 0610   VLDL 33 04/11/2012 0610   LDLCALC 52 04/11/2012 0610   HgbA1C  Lab Results  Component Value Date   HGBA1C 5.9* 04/11/2012    Urine Drug Screen:   No results found for this basename: labopia,  cocainscrnur,  labbenz,  amphetmu,  thcu,  labbarb    Alcohol Level: No results found for this basename: ETH:2 in the last 168 hours  Ct Head Wo Contrast 04/10/2012  IMPRESSION:  1.  No acute abnormality. 2.  Minimal atrophy and minimal chronic small vessel white matter ischemic changes in both cerebral hemispheres.    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  Hypoplastic irregular narrowed A1 segment of the left anterior cerebral artery.  Azygos configuration of the A2 segment of the anterior cerebral artery.  Question of complex aneurysm at the left middle cerebral artery bifurcation from which vessels arise measuring up to 3.5 mm.  Mild to moderate middle cerebral artery branch vessel irregularity.  Poor delineation AICAs.  Mild irregularity and narrowing of the posterior cerebral arteries greater on the left.    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  Hypoplastic  irregular narrowed A1 segment of the left anterior cerebral artery.  Azygos configuration of the A2 segment of the anterior cerebral artery.  Question of complex aneurysm at the left middle cerebral artery bifurcation from which vessels arise measuring up to 3.5 mm.  Mild to moderate middle cerebral artery branch vessel irregularity.  Poor delineation AICAs.  Mild irregularity and narrowing of the posterior cerebral arteries greater on the left.    CT angio of the neck 1. High-grade, near occlusive, stenosis of the left internal carotid artery.  2. Extensive mural thrombus at the left carotid artery bifurcation with intraluminal extension, potentially contributing to distal embolic disease.  3. Mild stenosis of the proximal right internal carotid artery.  4. Long segment eccentric mural plaque in the left common carotid artery with stenosis of less than 50%.  5. High-grade stenosis of the proximal left vertebral artery, the nondominant vessel.  6. Extensive nodular atherosclerotic change and intraluminal thrombus of the aortic arch and proximal descending aorta.   2D Echocardiogram  EF 55-60% LA mildly dilated.  Carotid Doppler  Right: No evidence of hemodynamically significant internal carotid artery stenosis. Left: 60-79% ICA stenosis. Bilateral: Vertebral artery flow is antegrade.   TEE 04/12/2012 normal LV function; negative saline microcavitation study; severe atherosclerosis descending aorta.   EKG - Pending  Therapy Recommendations - OP PT  Physical Exam   Pleasant elderly caucasian lady not in distress..Awake alert. Afebrile. Head is nontraumatic. Neck is supple without bruit. Hearing is normal. Cardiac exam no murmur or gallop. Lungs are clear to auscultation. Distal pulses are well felt.  Neurological Exam ; Awake  Alert oriented x 3. Normal speech and language.eye movements full without nystagmus. Face symmetric. Tongue midline. Normal strength except for mild right grip weakness  and diminished fine finger movements on the right. Orbits left over right approximately. Normal tone, reflexes and coordination. Normal sensation. Gait deferred.   ASSESSMENT [name] is a 71 y.o. female presenting with right upper extremity weakness and numbness.  Imaging confirmed acute non-hemorrhagic infarcts scattered throughout the left hemisphere consistent with an embolic etiology. Embolic etiology unknown. Could be associated with L ICA stenosis and artery to artery emboli. On aspirin 81 mg orally every day prior to admission. Now on aspirin 325 mg orally every day for secondary stroke prevention. Patient with resultant right upper extremity hemiparesis and numbness..   2 small cerebral aneurysms noted on MRA.the risk outweighed the possible benefits of treatment. At any rate this issue could not be addressed at this time due to her recent CVA.  Left ICA stenosis 60-79%CTA neck to further assess for stenosis by VVS  given unusual presentation with tenderness in the left carotid area with temporal headache.  Hypertension Hyperlipidemia, LDL 52, on statin PTA, at goal LDL < 100  Coronary  artery disease  Tobacco history  Hospital day # 3  TREATMENT/PLAN  Change to aspirin 81 and Plavix 75 mg daily for secondary stroke prevention if ok withVVS Per VVS, likely CEA in several weeks. Neuro recommends surgery as soon as possible, recommend between Christmas and New Year's Followup MRA in 3 months to assess aneurysms OP PT & OT, arranged  Follow up with Dr. Pearlean Brownie, Stroke Clinic, in 2 months.  Annie Main, MSN, RN, ANVP-BC, ANP-BC, [facility] Pager: [phone] 04/13/2012 9:09 AM  I have personally obtained a history, examined the patient, evaluated imaging results, and formulated the assessment and plan of care. I agree with the above.  Delia Heady, MD Medical Director [facility] Pager: [phone] 04/13/2012 9:09 AM

## 2012-04-13 NOTE — Progress Notes (Signed)
Physical Therapy Treatment Patient Details Name: SHRUTI ARREY MRN: [id number] DOB: Apr 03, 1941 Today's Date: 04/13/2012 Time: 4098-1191 PT Time Calculation (min): 13 min  PT Assessment / Plan / Recommendation Comments on Treatment Session  Have confirmed that pt does need some OPPT to address mobillity issues due to decr coordination of R LE.    Follow Up Recommendations  Outpatient PT;Other (comment)     Does the patient have the potential to tolerate intense rehabilitation     Barriers to Discharge        Equipment Recommendations  None recommended by PT    Recommendations for Other Services    Frequency Min 3X/week   Plan Discharge plan remains appropriate;Frequency remains appropriate    Precautions / Restrictions Precautions Precautions: Fall Restrictions Weight Bearing Restrictions: No   Pertinent Vitals/Pain     Mobility  Bed Mobility Bed Mobility: Supine to Sit;Sitting - Scoot to Edge of Bed Supine to Sit: 6: Modified independent (Device/Increase time) Sitting - Scoot to Edge of Bed: 6: Modified independent (Device/Increase time) Details for Bed Mobility Assistance: safe mobility Transfers Transfers: Sit to Stand;Stand to Sit Sit to Stand: 5: Supervision;With upper extremity assist;From bed Stand to Sit: 5: Supervision;With upper extremity assist;To bed Details for Transfer Assistance: no assist needed Ambulation/Gait Ambulation/Gait Assistance: 4: Min guard Ambulation Distance (Feet): 180 Feet Assistive device: None Ambulation/Gait Assistance Details: tentatve gait with pt wanting to reach for the rail.  Tends to stay at a slow cadence and can only slight incr speed to command.  Scanning/looking over her shoulders, turns and moving around obstacles does not cause LOB, but pt becomes more cautious. Gait Pattern: Step-through pattern Stairs: Yes Stairs Assistance: 4: Min guard Stair Management Technique: One rail Right;Step to pattern;Forwards Number of  Stairs: 4  Modified Rankin (Stroke Patients Only) Modified Rankin: Moderate disability    Exercises     PT Diagnosis:    PT Problem List:   PT Treatment Interventions:     PT Goals Acute Rehab PT Goals PT Goal Formulation: With patient Time For Goal Achievement: 04/18/12 Potential to Achieve Goals: Good PT Transfer Goal: Bed to Chair/Chair to Bed - Progress: Met PT Goal: Ambulate - Progress: Progressing toward goal PT Goal: Up/Down Stairs - Progress: Progressing toward goal  Visit Information  Last PT Received On: 04/13/12 Assistance Needed: +1    Subjective Data      Cognition  Overall Cognitive Status: Appears within functional limits for tasks assessed/performed Arousal/Alertness: Awake/alert Orientation Level: Appears intact for tasks assessed Behavior During Session: [facility] for tasks performed    Balance  Dynamic Standing Balance Dynamic Standing - Balance Support: During functional activity;No upper extremity supported Dynamic Standing - Level of Assistance: 5: Stand by assistance  End of Session PT - End of Session Activity Tolerance: Patient tolerated treatment well Patient left: in bed;with call bell/phone within reach Nurse Communication: Mobility status   GP     Amelie Caracci, Eliseo Gum 04/13/2012, 2:16 PM  04/13/2012  [place] Bing, PT [phone] (pager)

## 2012-04-13 NOTE — Discharge Summary (Signed)
Physician Discharge Summary  Patient ID: Sara [name] MRN: [id number] DOB/AGE: March 10, 1941 71 y.o.  Admit date: 04/10/2012 Discharge date: 04/13/2012  Primary Care Physician:  Default, Provider, MD   Discharge Diagnoses:    Principal Problem:  *CVA (cerebral infarction) Active Problems:  Carotid stenosis, left  Hypertension  Hypercholesteremia  Coronary artery disease  Tobacco abuse  Right sided weakness  Aneurysm of right internal carotid artery      Medication List     As of 04/13/2012 11:31 AM    STOP taking these medications         PRILOSEC OTC PO      TAKE these medications         amLODipine 5 MG tablet   Commonly known as: NORVASC   Take 5 mg by mouth daily.      aspirin 325 MG tablet   Take 1 tablet (325 mg total) by mouth daily.      atorvastatin 40 MG tablet   Commonly known as: LIPITOR   Take 1 tablet (40 mg total) by mouth daily.      buPROPion 150 MG 12 hr tablet   Commonly known as: WELLBUTRIN SR   1 tablet daily for 3 days and then increase to 1 tablet twice a day      calcium carbonate 600 MG Tabs   Commonly known as: OS-CAL   Take 600 mg by mouth 2 (two) times daily with a meal.      diazepam 5 MG tablet   Commonly known as: VALIUM   Take 1 tablet (5 mg total) by mouth daily.      DIOVAN 320 MG tablet   Generic drug: valsartan   TAKE ONE TABLET BY MOUTH EVERY DAY      gabapentin 300 MG capsule   Commonly known as: NEURONTIN   TAKE 3 CAPSULES BY MOUTH EVERY NIGHT AT BEDTIME FOR NEUROPATHY      HYDROcodone-acetaminophen 5-500 MG per tablet   Commonly known as: VICODIN   TAKE 1 TABLET BY MOUTH TWICE DAILY AS NEEDED      metaxalone 800 MG tablet   Commonly known as: SKELAXIN   Take 800 mg by mouth as needed.      metoprolol 50 MG tablet   Commonly known as: LOPRESSOR   TAKE  1/2 TABLET BY MOUTH TWICE DAILY      nitroGLYCERIN 0.4 MG SL tablet   Commonly known as: NITROSTAT   Place 0.4 mg under the tongue every 5 (five)  minutes as needed.          Disposition and Follow-up:  Will be discharged home today in stable condition. Dr. Bosie Helper office will call her with a follow up appointment to discuss surgery for her left ICA stenosis.  Consults:  Neurology, Dr. Pearlean Brownie         Vascular Surgery, Dr. Arbie Cookey   Significant Diagnostic Studies:  Ct Angio Neck W/cm &/or Wo/cm  04/13/2012  *RADIOLOGY REPORT*  Clinical Data:  Moderate left internal carotid artery stenosis by carotid Doppler ultrasound.  Left cerebral watershed territory infarcts.  CT ANGIOGRAPHY NECK  Technique:  Multidetector CT imaging of the neck was performed using the standard protocol during bolus administration of intravenous contrast.  Multiplanar CT image reconstructions including MIPs were obtained to evaluate the vascular anatomy. Carotid stenosis measurements (when applicable) are obtained utilizing NASCET criteria, using the distal internal carotid diameter as the denominator.  Contrast: 50mL OMNIPAQUE IOHEXOL 350 MG/ML SOLN  Comparison:  MRI brain  and MRA head 04/11/2012.  Findings:  Extensive atherosclerotic changes are present within the aortic arch with nodular mural plaque and thrombus.  A focal laceration is present at the origin of the left subclavian artery measuring 4.5 mm.  Mild narrowing is present at the origin of the left common carotid artery.  The vertebral arteries originate from subclavian arteries bilaterally.  A high-grade stenosis of the proximal left vertebral artery is partially calcified.  A focal mural calcification is present within the left vertebral artery with a 50% stenosis at the level of C7.  There are no additional stenoses after the artery enters the spinal canal.  The right vertebral artery is slightly dominant to the left without focal stenosis.  The right common carotid artery demonstrates eccentric calcified and soft tissue plaque without significant stenosis.  There is calcified and noncalcified plaque at the  right carotid bifurcation. Stenosis measures less than 50% relative to the distal vessel.  The cervical right internal carotid artery is tortuous.  Eccentric the soft tissue and calcified plaque is noted along the medial aspect of the left common carotid artery with a maximal stenosis of less than 50% relative to the distal common carotid artery.  There is a high-grade, near occlusive, stenosis of the proximal left internal carotid artery with residual lumen of less than 1 mm.  Although there are dense calcifications, this is largely due to soft tissue plaque.  Intraluminal thrombus is suggested as well.  The more distal cervical left internal carotid artery is slightly smaller than on the right, suggesting a highly significant proximal ICA stenosis.  Atherosclerotic calcifications are evident within the cavernous carotid arteries bilaterally.  Mild degenerative changes of the cervical spine are noted with the endplate disease most evident at C3-4 on the left and multilevel facet hypertrophy, particularly at C5-6 on the left.  Peripheral interstitial changes at the lung apices suggests early fibrotic disease.  Calcified pleural disease is noted at the lung apices bilaterally.   Review of the MIP images confirms the above findings.  IMPRESSION:  1.  High-grade, near occlusive, stenosis of the left internal carotid artery. 2.  Extensive mural thrombus at the left carotid artery bifurcation with intraluminal extension, potentially contributing to distal embolic disease. 3.  Mild stenosis of the proximal right internal carotid artery. 4.  Long segment eccentric mural plaque in the left common carotid artery with stenosis of less than 50%. 5.  High-grade stenosis of the proximal left vertebral artery, the nondominant vessel. 6.  Extensive nodular atherosclerotic change and intraluminal thrombus of the aortic arch and proximal descending aorta.   Original Report Authenticated By: Marin Roberts, M.D.     MRI Brain  12/17: IMPRESSION:  Acute non hemorrhagic infarcts scattered throughout the left  hemisphere as noted above.   Brief H and P: For complete details please refer to admission H and P, but in brief patient is a 71 yo female with subjective right hand weakness for the most of the day today without any other neurological deficits except a headache that started the last several hours in the ed relieveing with tylenol. She has also been complaining of left-sided neck pain. No recent illnesses. No fevers. No n/v/d. No cp no sob no le weakness. No pain in arms. No abd pain. No rashes.  No focal neuro deficits on exam. None noted by her husband or dtr who are both present. We were asked to admit her for further evaluation and management.     Hospital Course:  Principal Problem:  *CVA (cerebral infarction) Active Problems:  Carotid stenosis, left  Hypertension  Hypercholesteremia  Coronary artery disease  Tobacco abuse  Right sided weakness  Aneurysm of right internal carotid artery   Left Brain Embolic CVA -Work up has revealed a high-grade left ICA stenosis with extensive mural thrombus that likely accounts for her CVA and neck pain. -Has been seen by vascular surgery. -She will need a CEA. -Patient is adamant that she will not have this done before Christmas. -Plan is for her to be discharged home today, Dr. Bosie Helper office will call her for a follow up appointment to discuss the details of her surgery. -She will be on a full-dose ASA for secondary stroke prevention. -Was found to have an incidental aneurysm of the right carotid artery that does not require intervention according to neurosurgery, Dr. Dutch Quint who saw the patient in consultation.  Hyperlipidemia -Continue statin. -LDL was 52.  HTN -Well controlled. -Continue current medications.  Time spent on Discharge: Greater than 30 minutes.  SignedChaya Jan [facility] Pager: [phone] 04/13/2012, 11:31  AM

## 2012-04-13 NOTE — Progress Notes (Addendum)
[facility] [place]   71 yo female with subjective rt hand weakness for the most of the day today without any other neurological deficits except a headache that started the last several hours in the ed relieveing with tylenol. No recent illnesses. No fevers. No n/v/d. No cp no sob no le weakness. No pain in arms. No abd pain. No rashes. Asked to admit for concern of cva. No focal neuro deficits on exam. None noted by her husband or dtr who are both Present.  Right: No evidence of hemodynamically significant internal carotid artery stenosis. Left: 60-79% ICA stenosis. Bilateral: Vertebral artery flow is antegrade. Was seen on ultrasound and we are being consul;ted for left carotid stenosis.   No new complaints this morning.  She still has a chief complaint of left sided neck pain with tenderness to palpation.      Objective 116/72 57 98 F (36.7 C) (Oral) 14 96%  Intake/Output Summary (Last 24 hours) at 04/13/12 0756 Last data filed at 04/13/12 0600  Gross per 24 hour  Intake    400 ml  Output      0 ml  Net    400 ml    Right hand grip strength is 4+/5 and left is 5/5 with improvement  Palpable radial pulse.  Assessment/Planning:  The carotid stenosis does appear to be the most likely cause of her symptoms currently. I agree with a stroke service that she can be discharged and that would recommend carotid endarterectomy in a short interval in several weeks. I have ordered a CT angiogram until that she would be okay for discharge after this is been accomplished and reviewed. The patient understands critical importance of notifying us if she should develop any new focal deficits.  Our office will call her with a follow up appointment.     [name] [facility] 04/13/2012 7:56 AM --  Laboratory Lab Results:  Basename 04/11/12 0610 04/10/12 1947  WBC 9.3 9.3  HGB 11.5* 12.5  HCT 35.5* 37.8  PLT 241 272   BMET  Basename 04/12/12 0428 04/11/12  0610  NA 143 143  K 3.3* 3.6  CL 107 108  CO2 27 25  GLUCOSE 84 100*  BUN 18 13  CREATININE 0.71 0.62  CALCIUM 9.2 9.0    COAG Lab Results  Component Value Date   INR 0.96 04/10/2012   No results found for this basename: PTT    Antibiotics Anti-infectives    None         I have reviewed and agree with the above. I have reviewed the patient's CT angiogram and discuss it with her by telephone. This does show significant stenosis in the left internal carotid artery with no evidence of dissection or other thing that would cause her to have neck soreness. I feel it is appropriate for her to be discharged and readmitted in one to 2 weeks with definitive endarterectomy. The patient knows to notify us immediately should she develop any new neurologic deficits.  [name], MD 04/13/2012 12:14 PM

## 2012-04-14 ENCOUNTER — Encounter (HOSPITAL_COMMUNITY)
Admission: RE | Admit: 2012-04-14 | Discharge: 2012-04-14 | Disposition: A | Discharge status: Home / Self Care | Payer: Medicare Other | Source: Ambulatory Visit | Attending: Anesthesiology | Admitting: Anesthesiology

## 2012-04-14 ENCOUNTER — Encounter (HOSPITAL_COMMUNITY): Payer: Self-pay

## 2012-04-14 ENCOUNTER — Encounter (HOSPITAL_COMMUNITY)
Admit: 2012-04-14 | Discharge: 2012-04-14 | Disposition: A | Discharge status: Home / Self Care | Payer: Medicare Other | Attending: Vascular Surgery | Admitting: Vascular Surgery

## 2012-04-14 HISTORY — DX: Gastro-esophageal reflux disease without esophagitis: K21.9

## 2012-04-14 HISTORY — DX: Anxiety disorder, unspecified: F41.9

## 2012-04-14 HISTORY — DX: Bronchitis, not specified as acute or chronic: J40

## 2012-04-14 LAB — CBC
HCT: 39.2 % (ref 36.0–46.0)
Hemoglobin: 12.7 g/dL (ref 12.0–15.0)
MCH: 30.1 pg (ref 26.0–34.0)
MCHC: 32.4 g/dL (ref 30.0–36.0)
Platelets: 264 10*3/uL (ref 150–400)

## 2012-04-14 LAB — COMPREHENSIVE METABOLIC PANEL
ALT: 9 U/L (ref 0–35)
AST: 12 U/L (ref 0–37)
Alkaline Phosphatase: 92 U/L (ref 39–117)
CO2: 26 mEq/L (ref 19–32)
Chloride: 107 mEq/L (ref 96–112)
GFR calc non Af Amer: 61 mL/min — ABNORMAL LOW (ref >90)
Sodium: 144 mEq/L (ref 135–145)
Total Bilirubin: 0.4 mg/dL (ref 0.3–1.2)

## 2012-04-14 LAB — URINALYSIS, ROUTINE W REFLEX MICROSCOPIC
Hgb urine dipstick: NEGATIVE
Specific Gravity, Urine: 1.027 (ref 1.005–1.030)
pH: 6 (ref 5.0–8.0)

## 2012-04-14 LAB — SURGICAL PCR SCREEN: Staphylococcus aureus: NEGATIVE

## 2012-04-14 LAB — APTT: aPTT: 28 seconds (ref 24–37)

## 2012-04-14 LAB — ABO/RH: ABO/RH(D): A POS

## 2012-04-14 NOTE — Progress Notes (Signed)
Forwarded chart to anesthesia to review cardiac records. Patient followed by Dr. Patty Sermons, states does not have primary doctor.  Patient s/p admission to [place] 04/10/12 to 04/13/12.

## 2012-04-14 NOTE — Pre-Procedure Instructions (Signed)
20 Sara [name]  04/14/2012   Your procedure is scheduled on:  Monday April 24, 2012  Report to [facility] at 5:30 AM.  Call this number if you have problems the morning of surgery: [phone]   Remember:   Do not eat food or drink :After Midnight.    Take these medicines the morning of surgery with A SIP OF WATER: amlodipine, , valium, gabapentin, hydrocodone, metoprolol, nitroglycerin(if needed),    Do not wear jewelry, make-up or nail polish.  Do not wear lotions, powders, or perfumes.   Do not shave 48 hours prior to surgery.  Do not bring valuables to the hospital.  Contacts, dentures or bridgework may not be worn into surgery.  Leave suitcase in the car. After surgery it may be brought to your room.  For patients admitted to the hospital, checkout time is 11:00 AM the day of discharge.   Patients discharged the day of surgery will not be allowed to drive home.  Name and phone number of your driver: family / friend  Special Instructions: Shower using CHG 2 nights before surgery and the night before surgery.  If you shower the day of surgery use CHG.  Use special wash - you have one bottle of CHG for all showers.  You should use approximately 1/3 of the bottle for each shower.   Please read over the following fact sheets that you were given: Pain Booklet, Coughing and Deep Breathing, Blood Transfusion Information, MRSA Information and Surgical Site Infection Prevention

## 2012-04-17 ENCOUNTER — Other Ambulatory Visit: Payer: Self-pay

## 2012-04-17 MED ORDER — VALSARTAN 320 MG PO TABS: 320.0000 mg | ORAL_TABLET | Freq: Every day | ORAL | Status: DC

## 2012-04-20 ENCOUNTER — Other Ambulatory Visit: Payer: Self-pay

## 2012-04-20 DIAGNOSIS — M549 Dorsalgia, unspecified: Secondary | ICD-10-CM

## 2012-04-20 MED ORDER — HYDROCODONE-ACETAMINOPHEN 5-500 MG PO TABS: 1.0000 | ORAL_TABLET | Freq: Two times a day (BID) | ORAL | Status: DC | PRN

## 2012-04-23 MED ORDER — VANCOMYCIN HCL IN DEXTROSE 1-5 GM/200ML-% IV SOLN
1000.0000 mg | INTRAVENOUS | Status: DC
  Filled 2012-04-23: qty 200

## 2012-04-24 ENCOUNTER — Encounter (HOSPITAL_COMMUNITY): Payer: Self-pay | Admitting: *Deleted

## 2012-04-24 ENCOUNTER — Inpatient Hospital Stay (HOSPITAL_COMMUNITY)
Admission: RE | Admit: 2012-04-24 | Discharge: 2012-04-25 | DRG: 039 | Disposition: A | Discharge status: Home / Self Care | Payer: Medicare Other | Source: Ambulatory Visit | Attending: Vascular Surgery | Admitting: Vascular Surgery

## 2012-04-24 ENCOUNTER — Encounter (HOSPITAL_COMMUNITY)
Admission: RE | Disposition: A | Discharge status: Home / Self Care | Payer: Self-pay | Source: Ambulatory Visit | Attending: Vascular Surgery

## 2012-04-24 ENCOUNTER — Inpatient Hospital Stay (HOSPITAL_COMMUNITY): Payer: Medicare Other | Admitting: *Deleted

## 2012-04-24 ENCOUNTER — Telehealth: Payer: Self-pay | Admitting: Vascular Surgery

## 2012-04-24 DIAGNOSIS — F17200 Nicotine dependence, unspecified, uncomplicated: Secondary | ICD-10-CM | POA: Diagnosis present

## 2012-04-24 DIAGNOSIS — K219 Gastro-esophageal reflux disease without esophagitis: Secondary | ICD-10-CM | POA: Diagnosis present

## 2012-04-24 DIAGNOSIS — Z885 Allergy status to narcotic agent status: Secondary | ICD-10-CM

## 2012-04-24 DIAGNOSIS — Z880 Allergy status to penicillin: Secondary | ICD-10-CM

## 2012-04-24 DIAGNOSIS — Z888 Allergy status to other drugs, medicaments and biological substances status: Secondary | ICD-10-CM

## 2012-04-24 DIAGNOSIS — Z8673 Personal history of transient ischemic attack (TIA), and cerebral infarction without residual deficits: Secondary | ICD-10-CM

## 2012-04-24 DIAGNOSIS — R110 Nausea: Secondary | ICD-10-CM | POA: Diagnosis not present

## 2012-04-24 DIAGNOSIS — M549 Dorsalgia, unspecified: Secondary | ICD-10-CM

## 2012-04-24 DIAGNOSIS — Z9089 Acquired absence of other organs: Secondary | ICD-10-CM

## 2012-04-24 DIAGNOSIS — Z8249 Family history of ischemic heart disease and other diseases of the circulatory system: Secondary | ICD-10-CM

## 2012-04-24 DIAGNOSIS — F411 Generalized anxiety disorder: Secondary | ICD-10-CM | POA: Diagnosis present

## 2012-04-24 DIAGNOSIS — I6529 Occlusion and stenosis of unspecified carotid artery: Principal | ICD-10-CM | POA: Diagnosis present

## 2012-04-24 DIAGNOSIS — M129 Arthropathy, unspecified: Secondary | ICD-10-CM | POA: Diagnosis present

## 2012-04-24 DIAGNOSIS — I739 Peripheral vascular disease, unspecified: Secondary | ICD-10-CM | POA: Diagnosis present

## 2012-04-24 DIAGNOSIS — I10 Essential (primary) hypertension: Secondary | ICD-10-CM | POA: Diagnosis present

## 2012-04-24 DIAGNOSIS — I059 Rheumatic mitral valve disease, unspecified: Secondary | ICD-10-CM | POA: Diagnosis present

## 2012-04-24 DIAGNOSIS — I2510 Atherosclerotic heart disease of native coronary artery without angina pectoris: Secondary | ICD-10-CM | POA: Diagnosis present

## 2012-04-24 DIAGNOSIS — Z01818 Encounter for other preprocedural examination: Secondary | ICD-10-CM

## 2012-04-24 DIAGNOSIS — E7800 Pure hypercholesterolemia, unspecified: Secondary | ICD-10-CM | POA: Diagnosis present

## 2012-04-24 DIAGNOSIS — K449 Diaphragmatic hernia without obstruction or gangrene: Secondary | ICD-10-CM | POA: Diagnosis present

## 2012-04-24 DIAGNOSIS — Z01812 Encounter for preprocedural laboratory examination: Secondary | ICD-10-CM

## 2012-04-24 HISTORY — PX: ENDARTERECTOMY: SHX5162

## 2012-04-24 SURGERY — ENDARTERECTOMY, CAROTID
Anesthesia: General | Site: Neck | Laterality: Left | Wound class: Clean

## 2012-04-24 MED ORDER — ACETAMINOPHEN 10 MG/ML IV SOLN
INTRAVENOUS | Status: AC
  Filled 2012-04-24: qty 100

## 2012-04-24 MED ORDER — PROTAMINE SULFATE 10 MG/ML IV SOLN
INTRAVENOUS | Status: DC | PRN
  Administered 2012-04-24: 40 mg via INTRAVENOUS
  Administered 2012-04-24: 10 mg via INTRAVENOUS

## 2012-04-24 MED ORDER — GLYCOPYRROLATE 0.2 MG/ML IJ SOLN
INTRAMUSCULAR | Status: DC | PRN
  Administered 2012-04-24: 0.4 mg via INTRAVENOUS

## 2012-04-24 MED ORDER — PNEUMOCOCCAL VAC POLYVALENT 25 MCG/0.5ML IJ INJ
0.5000 mL | INJECTION | INTRAMUSCULAR | Status: DC
  Filled 2012-04-24: qty 0.5

## 2012-04-24 MED ORDER — NEOSTIGMINE METHYLSULFATE 1 MG/ML IJ SOLN
INTRAMUSCULAR | Status: DC | PRN
  Administered 2012-04-24: 3 mg via INTRAVENOUS

## 2012-04-24 MED ORDER — PANTOPRAZOLE SODIUM 40 MG PO TBEC
40.0000 mg | DELAYED_RELEASE_TABLET | Freq: Every day | ORAL | Status: DC
  Administered 2012-04-24: 40 mg via ORAL
  Filled 2012-04-24: qty 1

## 2012-04-24 MED ORDER — SODIUM CHLORIDE 0.9 % IR SOLN
Status: DC | PRN
  Administered 2012-04-24: 08:00:00

## 2012-04-24 MED ORDER — PHENOL 1.4 % MT LIQD
1.0000 | OROMUCOSAL | Status: DC | PRN
  Administered 2012-04-24: 1 via OROMUCOSAL
  Filled 2012-04-24: qty 177

## 2012-04-24 MED ORDER — SODIUM CHLORIDE 0.9 % IV SOLN: INTRAVENOUS | Status: DC

## 2012-04-24 MED ORDER — MIDAZOLAM HCL 2 MG/2ML IJ SOLN
INTRAMUSCULAR | Status: AC
  Filled 2012-04-24: qty 2

## 2012-04-24 MED ORDER — GABAPENTIN 300 MG PO CAPS
900.0000 mg | ORAL_CAPSULE | Freq: Every day | ORAL | Status: DC
  Administered 2012-04-24: 900 mg via ORAL
  Filled 2012-04-24 (×3): qty 3

## 2012-04-24 MED ORDER — ATORVASTATIN CALCIUM 40 MG PO TABS
40.0000 mg | ORAL_TABLET | Freq: Every day | ORAL | Status: DC
  Administered 2012-04-24: 40 mg via ORAL
  Filled 2012-04-24 (×2): qty 1

## 2012-04-24 MED ORDER — METOPROLOL TARTRATE 25 MG PO TABS
25.0000 mg | ORAL_TABLET | Freq: Two times a day (BID) | ORAL | Status: DC
  Filled 2012-04-24 (×3): qty 1

## 2012-04-24 MED ORDER — ACETAMINOPHEN 10 MG/ML IV SOLN: 1000.0000 mg | Freq: Once | INTRAVENOUS | Status: DC | PRN

## 2012-04-24 MED ORDER — ROCURONIUM BROMIDE 100 MG/10ML IV SOLN
INTRAVENOUS | Status: DC | PRN
  Administered 2012-04-24: 35 mg via INTRAVENOUS

## 2012-04-24 MED ORDER — NITROGLYCERIN 0.4 MG SL SUBL: 0.4000 mg | SUBLINGUAL_TABLET | SUBLINGUAL | Status: DC | PRN

## 2012-04-24 MED ORDER — ONDANSETRON HCL 4 MG/2ML IJ SOLN: 4.0000 mg | Freq: Once | INTRAMUSCULAR | Status: DC | PRN

## 2012-04-24 MED ORDER — ONDANSETRON HCL 4 MG/2ML IJ SOLN
4.0000 mg | Freq: Once | INTRAMUSCULAR | Status: AC
  Administered 2012-04-24: 4 mg via INTRAVENOUS

## 2012-04-24 MED ORDER — ONDANSETRON HCL 4 MG/2ML IJ SOLN
4.0000 mg | Freq: Four times a day (QID) | INTRAMUSCULAR | Status: DC | PRN
  Administered 2012-04-24 – 2012-04-25 (×2): 4 mg via INTRAVENOUS
  Filled 2012-04-24 (×2): qty 2

## 2012-04-24 MED ORDER — LACTATED RINGERS IV SOLN
INTRAVENOUS | Status: DC | PRN
  Administered 2012-04-24: 07:00:00 via INTRAVENOUS

## 2012-04-24 MED ORDER — SODIUM CHLORIDE 0.9 % IV SOLN
500.0000 mL | Freq: Once | INTRAVENOUS | Status: AC | PRN
  Administered 2012-04-24 (×2): 500 mL via INTRAVENOUS

## 2012-04-24 MED ORDER — HYDRALAZINE HCL 20 MG/ML IJ SOLN: 10.0000 mg | INTRAMUSCULAR | Status: DC | PRN

## 2012-04-24 MED ORDER — DOCUSATE SODIUM 100 MG PO CAPS: 100.0000 mg | ORAL_CAPSULE | Freq: Every day | ORAL | Status: DC

## 2012-04-24 MED ORDER — HYDROMORPHONE HCL PF 1 MG/ML IJ SOLN
0.2500 mg | INTRAMUSCULAR | Status: DC | PRN
  Administered 2012-04-24: 0.25 mg via INTRAVENOUS
  Administered 2012-04-24: 0.5 mg via INTRAVENOUS

## 2012-04-24 MED ORDER — IRBESARTAN 300 MG PO TABS
300.0000 mg | ORAL_TABLET | Freq: Every day | ORAL | Status: DC
  Filled 2012-04-24: qty 1

## 2012-04-24 MED ORDER — DIAZEPAM 5 MG PO TABS: 5.0000 mg | ORAL_TABLET | Freq: Every day | ORAL | Status: DC | PRN

## 2012-04-24 MED ORDER — VANCOMYCIN HCL IN DEXTROSE 1-5 GM/200ML-% IV SOLN
1000.0000 mg | Freq: Two times a day (BID) | INTRAVENOUS | Status: AC
  Administered 2012-04-24: 1000 mg via INTRAVENOUS
  Filled 2012-04-24: qty 200

## 2012-04-24 MED ORDER — LIDOCAINE HCL (PF) 1 % IJ SOLN
INTRAMUSCULAR | Status: AC
  Filled 2012-04-24: qty 30

## 2012-04-24 MED ORDER — EPHEDRINE SULFATE 50 MG/ML IJ SOLN
INTRAMUSCULAR | Status: DC | PRN
  Administered 2012-04-24 (×2): 5 mg via INTRAVENOUS

## 2012-04-24 MED ORDER — DOPAMINE-DEXTROSE 3.2-5 MG/ML-% IV SOLN: 3.0000 ug/kg/min | INTRAVENOUS | Status: DC | PRN

## 2012-04-24 MED ORDER — FENTANYL CITRATE 0.05 MG/ML IJ SOLN
INTRAMUSCULAR | Status: DC | PRN
  Administered 2012-04-24: 25 ug via INTRAVENOUS
  Administered 2012-04-24: 50 ug via INTRAVENOUS
  Administered 2012-04-24: 100 ug via INTRAVENOUS

## 2012-04-24 MED ORDER — GUAIFENESIN-DM 100-10 MG/5ML PO SYRP: 15.0000 mL | ORAL_SOLUTION | ORAL | Status: DC | PRN

## 2012-04-24 MED ORDER — METOPROLOL TARTRATE 1 MG/ML IV SOLN: 2.0000 mg | INTRAVENOUS | Status: DC | PRN

## 2012-04-24 MED ORDER — VANCOMYCIN HCL 1000 MG IV SOLR
1000.0000 mg | INTRAVENOUS | Status: DC | PRN
  Administered 2012-04-24: 1000 mg via INTRAVENOUS

## 2012-04-24 MED ORDER — DOPAMINE-DEXTROSE 3.2-5 MG/ML-% IV SOLN
3.0000 ug/kg/min | INTRAVENOUS | Status: DC | PRN
  Administered 2012-04-24: 3 ug/kg/min via INTRAVENOUS

## 2012-04-24 MED ORDER — ASPIRIN 325 MG PO TABS
325.0000 mg | ORAL_TABLET | Freq: Every day | ORAL | Status: DC
  Filled 2012-04-24: qty 1

## 2012-04-24 MED ORDER — ARTIFICIAL TEARS OP OINT
TOPICAL_OINTMENT | OPHTHALMIC | Status: DC | PRN
  Administered 2012-04-24: 1 via OPHTHALMIC

## 2012-04-24 MED ORDER — HYDROMORPHONE HCL PF 1 MG/ML IJ SOLN: 0.2500 mg | INTRAMUSCULAR | Status: DC | PRN

## 2012-04-24 MED ORDER — AMLODIPINE BESYLATE 5 MG PO TABS
5.0000 mg | ORAL_TABLET | Freq: Every day | ORAL | Status: DC
  Filled 2012-04-24: qty 1

## 2012-04-24 MED ORDER — LABETALOL HCL 5 MG/ML IV SOLN: 10.0000 mg | INTRAVENOUS | Status: DC | PRN

## 2012-04-24 MED ORDER — LIDOCAINE HCL 4 % MT SOLN
OROMUCOSAL | Status: DC | PRN
  Administered 2012-04-24: 4 mL via TOPICAL

## 2012-04-24 MED ORDER — PROPOFOL 10 MG/ML IV BOLUS
INTRAVENOUS | Status: DC | PRN
  Administered 2012-04-24: 90 mg via INTRAVENOUS

## 2012-04-24 MED ORDER — LIDOCAINE HCL (CARDIAC) 20 MG/ML IV SOLN
INTRAVENOUS | Status: DC | PRN
  Administered 2012-04-24: 30 mg via INTRAVENOUS

## 2012-04-24 MED ORDER — HYDROCODONE-ACETAMINOPHEN 5-500 MG PO TABS: 1.0000 | ORAL_TABLET | ORAL | Status: DC | PRN

## 2012-04-24 MED ORDER — HEPARIN SODIUM (PORCINE) 1000 UNIT/ML IJ SOLN
INTRAMUSCULAR | Status: DC | PRN
  Administered 2012-04-24: 6000 [IU] via INTRAVENOUS

## 2012-04-24 MED ORDER — OXYCODONE-ACETAMINOPHEN 5-325 MG PO TABS: 1.0000 | ORAL_TABLET | ORAL | Status: DC | PRN

## 2012-04-24 MED ORDER — HYDROMORPHONE HCL PF 1 MG/ML IJ SOLN
INTRAMUSCULAR | Status: AC
  Filled 2012-04-24: qty 1

## 2012-04-24 MED ORDER — HYDROCODONE-ACETAMINOPHEN 5-325 MG PO TABS
1.0000 | ORAL_TABLET | ORAL | Status: DC | PRN
  Administered 2012-04-24 – 2012-04-25 (×2): 1 via ORAL
  Filled 2012-04-24 (×2): qty 1

## 2012-04-24 MED ORDER — ACETAMINOPHEN 10 MG/ML IV SOLN
1000.0000 mg | Freq: Once | INTRAVENOUS | Status: AC | PRN
  Administered 2012-04-24: 1000 mg via INTRAVENOUS

## 2012-04-24 MED ORDER — MIDAZOLAM HCL 5 MG/5ML IJ SOLN
INTRAMUSCULAR | Status: DC | PRN
  Administered 2012-04-24: 0.5 mg via INTRAVENOUS
  Administered 2012-04-24: 1 mg via INTRAVENOUS

## 2012-04-24 MED ORDER — DOPAMINE-DEXTROSE 3.2-5 MG/ML-% IV SOLN
INTRAVENOUS | Status: AC
  Filled 2012-04-24: qty 250

## 2012-04-24 MED ORDER — CLOPIDOGREL BISULFATE 75 MG PO TABS
75.0000 mg | ORAL_TABLET | Freq: Every day | ORAL | Status: DC
  Filled 2012-04-24 (×2): qty 1

## 2012-04-24 MED ORDER — VECURONIUM BROMIDE 10 MG IV SOLR
INTRAVENOUS | Status: DC | PRN
  Administered 2012-04-24: 1 mg via INTRAVENOUS
  Administered 2012-04-24: 2 mg via INTRAVENOUS
  Administered 2012-04-24: 1 mg via INTRAVENOUS

## 2012-04-24 MED ORDER — 0.9 % SODIUM CHLORIDE (POUR BTL) OPTIME
TOPICAL | Status: DC | PRN
  Administered 2012-04-24: 2000 mL

## 2012-04-24 MED ORDER — ONDANSETRON HCL 4 MG/2ML IJ SOLN
INTRAMUSCULAR | Status: AC
  Filled 2012-04-24: qty 2

## 2012-04-24 MED ORDER — MORPHINE SULFATE 2 MG/ML IJ SOLN
2.0000 mg | INTRAMUSCULAR | Status: DC | PRN
  Administered 2012-04-24: 2 mg via INTRAVENOUS
  Filled 2012-04-24: qty 1

## 2012-04-24 MED ORDER — TRAMADOL HCL 50 MG PO TABS: 50.0000 mg | ORAL_TABLET | Freq: Four times a day (QID) | ORAL | Status: DC | PRN

## 2012-04-24 MED ORDER — DEXTROSE 5 % IV SOLN
10.0000 mg | INTRAVENOUS | Status: DC | PRN
  Administered 2012-04-24: 25 ug/min via INTRAVENOUS

## 2012-04-24 MED ORDER — ONDANSETRON HCL 4 MG/2ML IJ SOLN
INTRAMUSCULAR | Status: DC | PRN
  Administered 2012-04-24: 4 mg via INTRAVENOUS

## 2012-04-24 MED ORDER — POTASSIUM CHLORIDE CRYS ER 20 MEQ PO TBCR: 20.0000 meq | EXTENDED_RELEASE_TABLET | Freq: Once | ORAL | Status: AC | PRN

## 2012-04-24 SURGICAL SUPPLY — 49 items
BAG DECANTER FOR FLEXI CONT (MISCELLANEOUS) IMPLANT
BENZOIN TINCTURE PRP APPL 2/3 (GAUZE/BANDAGES/DRESSINGS) ×2 IMPLANT
CANISTER SUCTION 2500CC (MISCELLANEOUS) ×2 IMPLANT
CATH ROBINSON RED A/P 18FR (CATHETERS) ×2 IMPLANT
CATH SUCT 10FR WHISTLE TIP (CATHETERS) ×2 IMPLANT
CLIP LIGATING EXTRA MED SLVR (CLIP) ×2 IMPLANT
CLIP LIGATING EXTRA SM BLUE (MISCELLANEOUS) ×2 IMPLANT
CLOTH BEACON ORANGE TIMEOUT ST (SAFETY) ×2 IMPLANT
COVER SURGICAL LIGHT HANDLE (MISCELLANEOUS) ×2 IMPLANT
CRADLE DONUT ADULT HEAD (MISCELLANEOUS) ×2 IMPLANT
DECANTER SPIKE VIAL GLASS SM (MISCELLANEOUS) IMPLANT
DRAIN HEMOVAC 1/8 X 5 (WOUND CARE) IMPLANT
DRAPE WARM FLUID 44X44 (DRAPE) ×2 IMPLANT
ELECT REM PT RETURN 9FT ADLT (ELECTROSURGICAL) ×2
ELECTRODE REM PT RTRN 9FT ADLT (ELECTROSURGICAL) ×1 IMPLANT
EVACUATOR SILICONE 100CC (DRAIN) IMPLANT
GEL ULTRASOUND 20GR AQUASONIC (MISCELLANEOUS) IMPLANT
GLOVE BIO SURGEON STRL SZ 6.5 (GLOVE) ×4 IMPLANT
GLOVE BIOGEL PI IND STRL 6.5 (GLOVE) ×1 IMPLANT
GLOVE BIOGEL PI IND STRL 7.0 (GLOVE) ×2 IMPLANT
GLOVE BIOGEL PI INDICATOR 6.5 (GLOVE) ×1
GLOVE BIOGEL PI INDICATOR 7.0 (GLOVE) ×2
GLOVE SS BIOGEL STRL SZ 7.5 (GLOVE) ×1 IMPLANT
GLOVE SUPERSENSE BIOGEL SZ 7.5 (GLOVE) ×1
GLOVE SURG SS PI 7.0 STRL IVOR (GLOVE) ×4 IMPLANT
GOWN STRL NON-REIN LRG LVL3 (GOWN DISPOSABLE) ×6 IMPLANT
GOWN STRL REIN XL XLG (GOWN DISPOSABLE) ×4 IMPLANT
KIT BASIN OR (CUSTOM PROCEDURE TRAY) ×2 IMPLANT
KIT ROOM TURNOVER OR (KITS) ×2 IMPLANT
NEEDLE 22X1 1/2 (OR ONLY) (NEEDLE) IMPLANT
NS IRRIG 1000ML POUR BTL (IV SOLUTION) ×4 IMPLANT
PACK CAROTID (CUSTOM PROCEDURE TRAY) ×2 IMPLANT
PAD ARMBOARD 7.5X6 YLW CONV (MISCELLANEOUS) ×4 IMPLANT
PATCH HEMASHIELD 8X150 (Vascular Products) ×2 IMPLANT
SHUNT CAROTID BYPASS 10 (VASCULAR PRODUCTS) ×2 IMPLANT
SHUNT CAROTID BYPASS 12FRX15.5 (VASCULAR PRODUCTS) IMPLANT
SPECIMEN JAR SMALL (MISCELLANEOUS) ×2 IMPLANT
STRIP CLOSURE SKIN 1/2X4 (GAUZE/BANDAGES/DRESSINGS) ×2 IMPLANT
SUT ETHILON 3 0 PS 1 (SUTURE) IMPLANT
SUT PROLENE 6 0 CC (SUTURE) ×6 IMPLANT
SUT SILK 3 0 TIES 17X18 (SUTURE)
SUT SILK 3-0 18XBRD TIE BLK (SUTURE) IMPLANT
SUT VIC AB 3-0 SH 27 (SUTURE) ×2
SUT VIC AB 3-0 SH 27X BRD (SUTURE) ×2 IMPLANT
SUT VICRYL 4-0 PS2 18IN ABS (SUTURE) ×2 IMPLANT
SYR CONTROL 10ML LL (SYRINGE) IMPLANT
TOWEL OR 17X24 6PK STRL BLUE (TOWEL DISPOSABLE) ×2 IMPLANT
TOWEL OR 17X26 10 PK STRL BLUE (TOWEL DISPOSABLE) ×2 IMPLANT
WATER STERILE IRR 1000ML POUR (IV SOLUTION) ×2 IMPLANT

## 2012-04-24 NOTE — H&P (View-Only) (Signed)
[facility] [place] CONSULT NOTE 04/12/2012 DOB: 09/28/1940 MRN : [id number]  CC:left carotid stenosis, left brain stroke Referring Physician:Estela Y Hernandez Acosta, MD   History of Present Illness: 71 yo female with subjective rt hand weakness for the most of the day today without any other neurological deficits except a headache that started the last several hours in the ed relieveing with tylenol. No recent illnesses. No fevers. No n/v/d. No cp no sob no le weakness. No pain in arms. No abd pain. No rashes. Asked to admit for concern of cva. No focal neuro deficits on exam. None noted by her husband or dtr who are both Present.    Right: No evidence of hemodynamically significant internal carotid artery stenosis. Left: 60-79% ICA stenosis. Bilateral: Vertebral artery flow is antegrade.  Was seen on ultrasound and we are being consul;ted for left carotid stenosis.          Past Medical History  Diagnosis Date  . Mitral valve regurgitation     trace  . Hypertension   . Hypercholesteremia   . Coronary artery disease   . Arthritis   . H/O hiatal hernia   . Stroke     Past Surgical History  Procedure Date  . Cardiac catheterization 1994  . Back surgery     rods, fusion, three surgery  . Hysterotomy   . Carpal tunnel release     left  . Hand surgery     bilateral  . Cholecystectomy      ROS: [x] Positive  [ ] Denies    General: [ ] Weight loss, [ ] Fever, [ ] chills Neurologic: [ ] Dizziness, [ ] Blackouts, [ ] Seizure [ ] Stroke, [ ] "Mini stroke", [ ] Slurred speech, [ ] Temporary blindness; [ ] weakness in arms or legs, [ ] Hoarseness Cardiac: [ ] Chest pain/pressure, [ ] Shortness of breath at rest [ ] Shortness of breath with exertion, [ ] Atrial fibrillation or irregular heartbeat Vascular: [ ] Pain in legs with walking, [ ] Pain in legs at rest, [ ] Pain in legs at night,  [ ] Non-healing ulcer, [ ] Blood clot in vein/DVT,   Pulmonary: [  ] Home oxygen, [ ] Productive cough, [ ] Coughing up blood, [ ] Asthma,  [ ] Wheezing Musculoskeletal:  [ ] Arthritis, [ ] Low back pain, [ ] Joint pain Hematologic: [ ] Easy Bruising, [ ] Anemia; [ ] Hepatitis Gastrointestinal: [ ] Blood in stool, [ ] Gastroesophageal Reflux/heartburn, [ ] Trouble swallowing Urinary: [ ] chronic Kidney disease, [ ] on HD - [ ] MWF or [ ] TTHS, [ ] Burning with urination, [ ] Difficulty urinating Skin: [ ] Rashes, [ ] Wounds Psychological: [ ] Anxiety, [ ] Depression  Social History History  Substance Use Topics  . Smoking status: Current Every Day Smoker -- 0.5 packs/day for 60 years    Types: Cigarettes  . Smokeless tobacco: Not on file  . Alcohol Use: No    Family History Family History  Problem Relation Age of Onset  . Heart attack Father     Allergies  Allergen Reactions  . Percocet (Oxycodone-Acetaminophen) Rash  . Hydrochlorothiazide   . Indomethacin   . Paroxetine Hcl   . Penicillins   . Pravachol   . Quinine Derivatives   . Wellbutrin (Bupropion)     agitation  . Zocor (Simvastatin - High Dose) Other (See Comments)    Weakness/no   energy    Current Facility-Administered Medications  Medication Dose Route Frequency Provider Last Rate Last Dose  . 0.9 %  sodium chloride infusion  250 mL Intravenous PRN Marianne L York, PA      . aspirin tablet 325 mg  325 mg Oral Daily David L Rinehuls, PA-C   325 mg at 04/12/12 1024  . atorvastatin (LIPITOR) tablet 40 mg  40 mg Oral q1800 Rachal David, MD   40 mg at 04/12/12 1716  . buPROPion (WELLBUTRIN XL) 24 hr tablet 150 mg  150 mg Oral Daily Marianne L York, PA   150 mg at 04/12/12 1025  . gabapentin (NEURONTIN) capsule 100 mg  100 mg Oral TID Jessica U Vann, DO   100 mg at 04/12/12 1025  . HYDROcodone-acetaminophen (NORCO/VICODIN) 5-325 MG per tablet 1 tablet  1 tablet Oral Q4H PRN Jessica U Vann, DO   1 tablet at 04/12/12 1716  . nicotine (NICODERM CQ - dosed in mg/24 hours) patch 14 mg   14 mg Transdermal Daily Marianne L York, PA   14 mg at 04/12/12 1025  . pantoprazole (PROTONIX) EC tablet 40 mg  40 mg Oral Daily Karen J Kirby, NP   40 mg at 04/12/12 1025  . sodium chloride 0.9 % injection 3 mL  3 mL Intravenous Q12H Rachal David, MD   3 mL at 04/12/12 1028  . sodium chloride 0.9 % injection 3 mL  3 mL Intravenous Q12H Rachal David, MD   3 mL at 04/11/12 0016  . sodium chloride 0.9 % injection 3 mL  3 mL Intravenous PRN Rachal David, MD          Significant Diagnostic Studies: CBC Lab Results  Component Value Date   WBC 9.3 04/11/2012   HGB 11.5* 04/11/2012   HCT 35.5* 04/11/2012   MCV 92.4 04/11/2012   PLT 241 04/11/2012    BMET    Component Value Date/Time   NA 143 04/12/2012 0428   K 3.3* 04/12/2012 0428   CL 107 04/12/2012 0428   CO2 27 04/12/2012 0428   GLUCOSE 84 04/12/2012 0428   BUN 18 04/12/2012 0428   CREATININE 0.71 04/12/2012 0428   CALCIUM 9.2 04/12/2012 0428   GFRNONAA 84* 04/12/2012 0428   GFRAA >90 04/12/2012 0428    COAG Lab Results  Component Value Date   INR 0.96 04/10/2012   No results found for this basename: PTT     Physical Examination BP Readings from Last 3 Encounters:  04/12/12 103/52  04/12/12 103/52  03/14/12 138/80   Temp Readings from Last 3 Encounters:  04/12/12 97.4 F (36.3 C)   04/12/12 97.4 F (36.3 C)    SpO2 Readings from Last 3 Encounters:  04/12/12 99%  04/12/12 99%   Pulse Readings from Last 3 Encounters:  04/12/12 65  04/12/12 65  03/14/12 76    General:  WDWN in NAD Gait: Normal HENT: WNL Eyes: Pupils equal Pulmonary: normal non-labored breathing , without Rales, rhonchi,  wheezing Cardiac: RRR, without  Murmurs, rubs or gallops; No carotid bruits Abdomen: soft, NT, no masses Skin: no rashes, ulcers noted Vascular Exam/Pulses:palpable radial and PT/DP pulses equal bilaterally.  Patient reports tenderness to palpation left lateral neck.  Extremities without ischemic changes, no  Gangrene , no cellulitis; no open wounds;  Musculoskeletal: no muscle wasting or atrophy  Neurologic: A&O X 3; Appropriate Affect ; Awake Alert oriented x 3. Normal speech and language.eye movements full without nystagmus. Face symmetric. Tongue midline.  Normal   strength except for mild right grip weakness and diminished fine finger movements on the right. Orbits left over right approximately. Normal tone, reflexes and coordination. Normal sensation.   SENSATION: normal; MOTOR FUNCTION: Pt has good and not equal strength in all extremities - 5/5 left  4/5 on the right hand, triceps and shoulder Speech is fluent/normal  Non-Invasive Vascular Imaging: Right: No evidence of hemodynamically significant internal carotid artery stenosis. Left: 60-79% ICA stenosis. Bilateral: Vertebral artery flow is antegrade.    ASSESSMENT/PLAN: Left carotid stenosis. Right upper extremity weakness.  No other symptoms of CVA. I will discuss plan of care with Dr. Jahayra Mazo  COLLINS, EMMA MAUREEN PA-C  I have examined the patient, reviewed and agree with above. The patient was admitted with clumsiness in her right arm and hand. She had difficulty writing. She denies any other focal neurologic deficits. She denies any other prior neurologic deficits. Workup to include MR I have her brain showed that she did have appeared to be multiple small emboli to her left brain resulting in stroke. The MR I also suggested small bilateral intracranial aneurysms. Her duplex of her carotid arteries showed no evidence of right carotid stenosis in the upper end of the 60-79% left internal carotid artery stenosis. Cardiac evaluation has been negative for embolic source.  Interesting on physical exam the patient does have tenderness in her left carotid area and also reports soreness in her left carotid. She also has a left temporal headache. I explained this is quite unusual is a presentation of carotid disease and may be incidental. I have  recommended a CT angiogram of her neck for further evaluation of this and the degree of stenosis.  The carotid stenosis does appear to be the most likely cause of her symptoms currently. I agree with a stroke service that she can be discharged and that would recommend carotid endarterectomy in a short interval in several weeks. I have ordered a CT angiogram until that she would be okay for discharge after this is been accomplished and reviewed. The patient understands critical importance of notifying us if she should develop any new focal deficits. Leilanni Halvorson, MD 04/12/2012 7:43 PM   

## 2012-04-24 NOTE — Anesthesia Preprocedure Evaluation (Addendum)
Anesthesia Evaluation  Patient identified by MRN, date of birth, ID band Patient awake    Reviewed: Allergy & Precautions, H&P , NPO status , Patient's Chart, lab work & pertinent test results, reviewed documented beta blocker date and time   Airway Mallampati: II TM Distance: >3 FB Neck ROM: Full    Dental  (+) Edentulous Upper and Dental Advisory Given   Pulmonary Current Smoker,  breath sounds clear to auscultation        Cardiovascular hypertension, Pt. on medications and Pt. on home beta blockers + angina + CAD and + Peripheral Vascular Disease Rhythm:Regular Rate:Normal     Neuro/Psych Anxiety CVA, Residual Symptoms    GI/Hepatic Neg liver ROS, hiatal hernia, GERD-  Medicated,  Endo/Other  negative endocrine ROS  Renal/GU negative Renal ROS     Musculoskeletal  (+) Arthritis -,   Abdominal Normal abdominal exam  (+)   Peds  Hematology negative hematology ROS (+)   Anesthesia Other Findings   Reproductive/Obstetrics                           Anesthesia Physical Anesthesia Plan  ASA: III  Anesthesia Plan: General   Post-op Pain Management:    Induction: Intravenous  Airway Management Planned: Oral ETT  Additional Equipment: Arterial line  Intra-op Plan:   Post-operative Plan: Extubation in OR  Informed Consent: I have reviewed the patients History and Physical, chart, labs and discussed the procedure including the risks, benefits and alternatives for the proposed anesthesia with the patient or authorized representative who has indicated his/her understanding and acceptance.   Dental advisory given  Plan Discussed with: Anesthesiologist, Surgeon and CRNA  Anesthesia Plan Comments:        Anesthesia Quick Evaluation

## 2012-04-24 NOTE — Discharge Summary (Signed)
[facility] Discharge Summary   Patient ID:  Sara [name] MRN: [id number] DOB/AGE: 1941-02-20 71 y.o.  Admit date: 04/24/2012 Discharge date: 04/25/12 Date of Surgery: 04/24/2012 Surgeon: Surgeon(s): Larina Earthly, MD  Admission Diagnosis: Left Internal Carotid Artery Stenosis  Discharge Diagnoses:  Left Internal Carotid Artery Stenosis  Secondary Diagnoses: Past Medical History  Diagnosis Date  . Mitral valve regurgitation     trace  . Hypercholesteremia   . Arthritis   . H/O hiatal hernia   . Stroke   . Coronary artery disease     sees Dr. Patty Sermons  . Hypertension     all managed by Dr. Haynes Dage bill  . Anxiety   . Bronchitis     hx of  . GERD (gastroesophageal reflux disease)     hx of    Procedure(s): ENDARTERECTOMY CAROTID  Discharged Condition: good  HPI: Sara [name] is a 71 y.o. female was admitted with clumsiness in her right arm and hand. She had difficulty writing. She denies any other focal neurologic deficits. She denies any other prior neurologic deficits. Workup to include MR I have her brain showed that she did have appeared to be multiple small emboli to her left brain resulting in stroke. The MR I also suggested small bilateral intracranial aneurysms. Her duplex of her carotid arteries showed no evidence of right carotid stenosis in the upper end of the 60-79% left internal carotid artery stenosis. Cardiac evaluation has been negative for embolic source.  Interesting on physical exam the patient does have tenderness in her left carotid area and also reports soreness in her left carotid. She also has a left temporal headache. I explained this is quite unusual is a presentation of carotid disease and may be incidental. CTA of the neck done 04/13/12 1. High-grade, near occlusive, stenosis of the left internal  carotid artery.  2. Extensive mural thrombus at the left carotid artery bifurcation  with intraluminal extension,  potentially contributing to distal  embolic disease.  3. Mild stenosis of the proximal right internal carotid artery.  4. Long segment eccentric mural plaque in the left common carotid  artery with stenosis of less than 50%.  5. 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  6. Extensive nodular atherosclerotic change and intraluminal  thrombus of the aortic arch and proximal descending aorta.  Pt was admitted for Left CEA   Hospital Course:  Sara [name] is a 71 y.o. female is S/P Left Procedure(s): ENDARTERECTOMY CAROTID Extubated: POD # 0 Post-op wounds healing well Neuro exam stable with 4+/5 strength in RUE Pt. Ambulating, voiding and taking PO diet without difficulty. Pt pain controlled with PO pain meds. Labs as below Complications:mild post-op nausea - resolving  Consults:     Significant Diagnostic Studies: CBC Lab Results  Component Value Date   WBC 9.8 04/14/2012   HGB 12.7 04/14/2012   HCT 39.2 04/14/2012   MCV 92.9 04/14/2012   PLT 264 04/14/2012    BMET    Component Value Date/Time   NA 144 04/14/2012 1252   K 4.0 04/14/2012 1252   CL 107 04/14/2012 1252   CO2 26 04/14/2012 1252   GLUCOSE 94 04/14/2012 1252   BUN 17 04/14/2012 1252   CREATININE 0.91 04/14/2012 1252   CALCIUM 9.7 04/14/2012 1252   GFRNONAA 61* 04/14/2012 1252   GFRAA 71* 04/14/2012 1252   COAG Lab Results  Component Value Date   INR 0.97 04/14/2012  INR 0.96 04/10/2012     Disposition:  Discharge to :Home Discharge Orders    Future Appointments: Provider: Department: Dept Phone: Center:   07/10/2012 9:15 AM Cassell Clement, MD [facility] Main Office [place]) [phone] [facility]   07/10/2012 9:30 AM [facility] Main Office [place]) [phone] [facility]     Future Orders Please Complete By Expires   Resume previous diet      Driving Restrictions      Comments:   No driving   Lifting  restrictions      Comments:   No lifting for 4 weeks   Call MD for:  temperature >100.5      Call MD for:  redness, tenderness, or signs of infection (pain, swelling, bleeding, redness, odor or green/yellow discharge around incision site)      Call MD for:  severe or increased pain, loss or decreased feeling  in affected limb(s)      Increase activity slowly      Comments:   Walk with assistance use walker or cane as needed   May shower       Scheduling Instructions:   Wednesday   No dressing needed      may wash over wound with mild soap and water      CAROTID Sugery: Call MD for difficulty swallowing or speaking; weakness in arms or legs that is a new symtom; severe headache.  If you have increased swelling in the neck and/or  are having difficulty breathing, CALL 911         Mailee, Klaas  Home Medication Instructions MVH:[id number]   Printed on:04/24/12 1016  Medication Information                    nitroGLYCERIN (NITROSTAT) 0.4 MG SL tablet Place 0.4 mg under the tongue every 5 (five) minutes as needed.             calcium carbonate (OS-CAL) 600 MG TABS Take 600 mg by mouth 2 (two) times daily with a meal.             amLODipine (NORVASC) 5 MG tablet Take 5 mg by mouth daily.             atorvastatin (LIPITOR) 40 MG tablet Take 1 tablet (40 mg total) by mouth daily.           clopidogrel (PLAVIX) 75 MG tablet Take 1 tablet (75 mg total) by mouth daily.           diazepam (VALIUM) 5 MG tablet Take 5 mg by mouth daily as needed. For anxiety           metoprolol (LOPRESSOR) 50 MG tablet Take 25 mg by mouth 2 (two) times daily.           gabapentin (NEURONTIN) 300 MG capsule Take 900 mg by mouth at bedtime.           aspirin 325 MG tablet Take 325 mg by mouth daily.           valsartan (DIOVAN) 320 MG tablet Take 1 tablet (320 mg total) by mouth daily.           HYDROcodone-acetaminophen (VICODIN) 5-500 MG per tablet Take 1 tablet by mouth every 4 (four) hours  as needed for pain. For pain           Zofran 4mg  1 tab PO q 6 hours as needed for nausea  Verbal and written Discharge instructions given  to the patient. Wound care per Discharge AVS Follow-up Information    Follow up with EARLY, TODD, MD. In 2 weeks. (office will arrange - sent)    Contact information:   [address] [place] [address] [phone]          Signed: Marlowe Shores 04/24/2012, 10:16 AM

## 2012-04-24 NOTE — Preoperative (Signed)
Beta Blockers   Reason not to administer Beta Blockers:Not Applicable, took this AM 

## 2012-04-24 NOTE — Progress Notes (Signed)
Decreased dopamine from 2mcg/kg/min to 2.28mcg/kg/min.  Art BP = 156/50

## 2012-04-24 NOTE — Interval H&P Note (Signed)
History and Physical Interval Note:  04/24/2012 7:18 AM  Sara [name]  has presented today for surgery, with the diagnosis of Left Internal Carotid Artery Stenosis  The various methods of treatment have been discussed with the patient and family. After consideration of risks, benefits and other options for treatment, the patient has consented to  Procedure(s) (LRB) with comments: ENDARTERECTOMY CAROTID (Left) as a surgical intervention .  The patient's history has been reviewed, patient examined, no change in status, stable for surgery.  I have reviewed the patient's chart and labs.  Questions were answered to the patient's satisfaction.     Breandan People

## 2012-04-24 NOTE — Progress Notes (Signed)
Noted that MD order for dopamine was for 3-5 mcg/kg/min, so dopamine returned to original dose.  BP = 109/41 arterial.

## 2012-04-24 NOTE — OR Nursing (Addendum)
Patient displayed strength in left hand and bilateral feet, slight weakness in right hand, tongue midline; Post-op.

## 2012-04-24 NOTE — Op Note (Signed)
OPERATIVE REPORT  DATE OF SURGERY: 04/24/2012  PATIENT: Sara [name], 71 y.o. female MRN: [id number]  DOB: 1941/03/29  PRE-OPERATIVE DIAGNOSIS: Symptomatic left carotid stenosis  POST-OPERATIVE DIAGNOSIS:  Same  PROCEDURE: Left carotid endarterectomy and Dacron patch angioplasty  SURGEON:  Gretta Began, M.D.  PHYSICIAN ASSISTANT: Roczniak  ANESTHESIA:  Gen.  EBL: Less than 100 ml  Total I/O In: 800 [I.V.:800] Out: -   BLOOD ADMINISTERED: None  DRAINS: None  SPECIMEN: Carotid plaque  COUNTS CORRECT:  YES  PLAN OF CARE: PACU   PATIENT DISPOSITION:  PACU - hemodynamically stable  PROCEDURE DETAILS: The patient was admitted approximately 2 weeks ago with a left brain stroke. She had right arm weakness. MRI showed probable embolic stroke. Work up revealed severe left internal carotid artery stenosis which was quite irregular. The patient also had extensive plaque in the left common carotid artery as well. She is taking upper and this time for endarterectomy  The patient was taken up replacing position where the area of the left neck was prepped and draped in sterile fashion. The patient had a bifurcation in the mid left neck. An incision was made over this anterior sternocleidomastoid and carried down through the platysma electrocautery. The sternocleidomastoid reflect posteriorly the carotid sheath was opened. The facial vein was ligated with 2-0 silk ties and divided. The common carotid artery was encircled with an umbilical tape and Rummel tourniquet. The vagus and hypoglossal nerves were identified and preserved. The internal carotid was encircled with an umbilical tape and Rummel tourniquet. The actual problems with a blue vessel loop and the superior dart artery was circled with 2-0 silk Potts tie. Patient had extensive plaque in the common carotid artery and dissection was continued further proximally and the incision was included and extended further proximally as well. The  patient was given 6000 units of intravenous heparin after adequate circulation time the internal/external common carotid arteries were occluded. The common carotid artery was opened with an 11 blade salicylate Potts scissors. The incision was extended onto the internal carotid artery. A 10 shunt was passed up the internal carotid and down the common carotid was secured with Rummel tourniquet. The plaque was divided proximally and was extended endarterectomy on to the bifurcation. The endarterectomy was continued into the external carotid with an eversion technique and the internal carotid and with an open fashion. The plaque feathered nicely in the internal carotid as well as the external carotid. The plaque continued further proximally in the common carotid artery and therefore the incision was extended even further down towards the sternal notch. The common carotid artery was exposed towards the base of the neck. The decision was made to remove the shunt and place a long shunt. The carotid artery occluded on the internal and common and a long shunt was passed down the common carotid artery taking care not to leave the prior plane. As and passed up the internal carotid artery after the usual flushing maneuvers. The common carotid artery arteriotomy was extended further proximally and there was less plaque at this level. The endarterectomy was extended proximally and a good endpoint was encountered. Remaining debris was removed from the endarterectomy plane. A long Finesse Hemashield patch was brought onto the field and was sewn as a patch angioplasty with a running 6-0 Prolene suture. Prior to completion of the closure the shunt was removed in the usual flushing maneuvers were undertaken. Anastomosis completed and flow was assured first to the external and internal carotid arteries. Excellent flow  characteristics were noted with hand-held Doppler in the internal and external carotid arteries. The patient was given  50 mg of protamine to reverse heparin. Several additional sutures required for hemostasis. The wound irrigated with saline. Hemostasis electrocautery. The wound is closed with several 3-0 Vicryl sutures were approximation of the masseter the carotid sheath. Next the platysma first a running 3-0 Vicryl suture and the skin was closed with a 4 subjective Robert stitch. The patient was awakened neurologically intact in the operating room was transferred to the recovery in stable condition   Gretta Began, M.D. 04/24/2012 10:10 AM

## 2012-04-24 NOTE — Transfer of Care (Signed)
Immediate Anesthesia Transfer of Care Note  Patient: Sara [name]  Procedure(s) Performed: Procedure(s) (LRB) with comments: ENDARTERECTOMY CAROTID (Left)  Patient Location: PACU  Anesthesia Type:General  Level of Consciousness: awake, oriented and patient cooperative  Airway & Oxygen Therapy: Patient Spontanous Breathing and Patient connected to nasal cannula oxygen  Post-op Assessment: Report given to PACU RN, Post -op Vital signs reviewed and stable, Patient moving all extremities X 4 and Patient able to stick tongue midline  Post vital signs: Reviewed and stable  Complications: No apparent anesthesia complications

## 2012-04-24 NOTE — Anesthesia Procedure Notes (Signed)
Procedure Name: Intubation Performed by: Everlene Balls TODD Pre-anesthesia Checklist: Patient identified, Emergency Drugs available, Suction available and Patient being monitored Oxygen Delivery Method: Circle system utilized Preoxygenation: Pre-oxygenation with 100% oxygen Intubation Type: IV induction Ventilation: Mask ventilation without difficulty Laryngoscope Size: Mac and 3 Grade View: Grade I Tube type: Oral Number of attempts: 1 Airway Equipment and Method: Stylet and LTA kit utilized Placement Confirmation: ETT inserted through vocal cords under direct vision,  positive ETCO2 and breath sounds checked- equal and bilateral Secured at: 19 cm Tube secured with: Tape Dental Injury: Teeth and Oropharynx as per pre-operative assessment

## 2012-04-24 NOTE — Telephone Encounter (Signed)
Message copied by Fredrich Birks on Mon Apr 24, 2012 10:59 AM ------      Message from: Melene Plan      Created: Mon Apr 24, 2012 10:04 AM                   ----- Message -----         From: Marlowe Shores, [place]         Sent: 04/24/2012   9:53 AM           To: Melene Plan, RN            Merry Christmas , happy new year!            2 week F/U CEA - Early

## 2012-04-24 NOTE — Telephone Encounter (Signed)
Lm for patient, dpm °

## 2012-04-25 ENCOUNTER — Encounter (HOSPITAL_COMMUNITY): Payer: Self-pay | Admitting: Vascular Surgery

## 2012-04-25 LAB — CBC
HCT: 32.5 % — ABNORMAL LOW (ref 36.0–46.0)
MCHC: 33.2 g/dL (ref 30.0–36.0)
Platelets: 221 10*3/uL (ref 150–400)
RDW: 14.2 % (ref 11.5–15.5)

## 2012-04-25 LAB — BASIC METABOLIC PANEL
BUN: 15 mg/dL (ref 6–23)
GFR calc Af Amer: >90 mL/min (ref >90)
GFR calc non Af Amer: 85 mL/min — ABNORMAL LOW (ref >90)
Potassium: 3.4 mEq/L — ABNORMAL LOW (ref 3.5–5.1)
Sodium: 143 mEq/L (ref 135–145)

## 2012-04-25 MED ORDER — ONDANSETRON HCL 4 MG PO TABS: 4.0000 mg | ORAL_TABLET | Freq: Every day | ORAL | Status: DC | PRN

## 2012-04-25 NOTE — Progress Notes (Signed)
Utilization review completed.  

## 2012-04-25 NOTE — Progress Notes (Signed)
Pt discharged home per MD order. All discharge instructions were reviewed all and questions answered. Surgical site to left neck was clean, dry, and intact with no sign or symptoms of infection noted.

## 2012-04-28 ENCOUNTER — Other Ambulatory Visit: Payer: Self-pay | Admitting: Cardiology

## 2012-04-28 DIAGNOSIS — M549 Dorsalgia, unspecified: Secondary | ICD-10-CM

## 2012-04-28 NOTE — Telephone Encounter (Signed)
New Problem:    Patient called in needing a refill of her HYDROcodone-acetaminophen (VICODIN) 5-500 MG per tablet.  Please call back if you have any questions.

## 2012-04-28 NOTE — Telephone Encounter (Signed)
Ok to refill vicodin? 

## 2012-04-28 NOTE — Anesthesia Postprocedure Evaluation (Signed)
  Anesthesia Post-op Note  Patient: Sara [name]  Procedure(s) Performed: Procedure(s) (LRB) with comments: ENDARTERECTOMY CAROTID (Left)  Patient Location: PACU  Anesthesia Type:General  Level of Consciousness: awake, alert  and oriented  Airway and Oxygen Therapy: Patient Spontanous Breathing and Patient connected to nasal cannula oxygen  Post-op Pain: mild  Post-op Assessment: Post-op Vital signs reviewed and Patient's Cardiovascular Status Stable  Post-op Vital Signs: stable  Complications: No apparent anesthesia complications

## 2012-05-01 MED ORDER — HYDROCODONE-ACETAMINOPHEN 5-500 MG PO TABS: 1.0000 | ORAL_TABLET | ORAL | Status: DC | PRN

## 2012-05-01 NOTE — Telephone Encounter (Signed)
Advised patient and called to pharmacy.  

## 2012-05-02 ENCOUNTER — Other Ambulatory Visit: Payer: Self-pay

## 2012-05-02 DIAGNOSIS — G5793 Unspecified mononeuropathy of bilateral lower limbs: Secondary | ICD-10-CM

## 2012-05-02 MED ORDER — GABAPENTIN 300 MG PO CAPS: 900.0000 mg | ORAL_CAPSULE | Freq: Every day | ORAL | Status: DC

## 2012-05-08 ENCOUNTER — Encounter: Payer: Self-pay | Admitting: Vascular Surgery

## 2012-05-09 ENCOUNTER — Ambulatory Visit (INDEPENDENT_AMBULATORY_CARE_PROVIDER_SITE_OTHER): Payer: Medicare Other | Admitting: Vascular Surgery

## 2012-05-09 ENCOUNTER — Encounter: Payer: Self-pay | Admitting: Vascular Surgery

## 2012-05-09 VITALS — BP 142/68 | HR 71 | Ht 65.0 in | Wt 134.3 lb

## 2012-05-09 DIAGNOSIS — I6529 Occlusion and stenosis of unspecified carotid artery: Secondary | ICD-10-CM | POA: Insufficient documentation

## 2012-05-09 DIAGNOSIS — Z48812 Encounter for surgical aftercare following surgery on the circulatory system: Secondary | ICD-10-CM

## 2012-05-09 NOTE — Progress Notes (Signed)
Patient presents today for followup of her left carotid endarterectomy for symptomatic left carotid stenosis. She had extensive plaque from her common carotid artery up through the bifurcation and not had a extensive endarterectomy in her common and bifurcation with a patch angioplasty.  She continues to improve from her preoperative stroke. She does continue to have mild clumsiness in her right hand reports this continues to improve. She has no other neurologic deficits.  Her neck incision is healing quite nicely. She is grossly intact neurologically. She has no carotid bruits.  Impression and plan stable status post left carotid endarterectomy. We will see her again in 6 months with a repeat carotid duplex. She does not have any significant stenosis in her right carotid system. She'll notify should she develop any neurologic deficits or wound problems.

## 2012-05-22 ENCOUNTER — Other Ambulatory Visit: Payer: Self-pay | Admitting: *Deleted

## 2012-05-22 DIAGNOSIS — M549 Dorsalgia, unspecified: Secondary | ICD-10-CM

## 2012-05-22 MED ORDER — HYDROCODONE-ACETAMINOPHEN 5-500 MG PO TABS: 1.0000 | ORAL_TABLET | ORAL | Status: DC | PRN

## 2012-06-19 ENCOUNTER — Other Ambulatory Visit: Payer: Self-pay | Admitting: *Deleted

## 2012-06-19 DIAGNOSIS — M549 Dorsalgia, unspecified: Secondary | ICD-10-CM

## 2012-06-19 MED ORDER — HYDROCODONE-ACETAMINOPHEN 5-325 MG PO TABS: 1.0000 | ORAL_TABLET | Freq: Four times a day (QID) | ORAL | Status: DC | PRN

## 2012-06-19 NOTE — Telephone Encounter (Signed)
Hydrocodone 5/500 no longer available ok to change to hydrocodone 5/325 mg 1 every 6 hours prn

## 2012-07-10 ENCOUNTER — Ambulatory Visit (INDEPENDENT_AMBULATORY_CARE_PROVIDER_SITE_OTHER): Payer: Medicare Other | Admitting: Cardiology

## 2012-07-10 ENCOUNTER — Other Ambulatory Visit (INDEPENDENT_AMBULATORY_CARE_PROVIDER_SITE_OTHER): Payer: Medicare Other

## 2012-07-10 ENCOUNTER — Encounter: Payer: Self-pay | Admitting: Cardiology

## 2012-07-10 VITALS — BP 137/72 | HR 77 | Ht 65.0 in | Wt 138.6 lb

## 2012-07-10 DIAGNOSIS — R0989 Other specified symptoms and signs involving the circulatory and respiratory systems: Secondary | ICD-10-CM

## 2012-07-10 DIAGNOSIS — I119 Hypertensive heart disease without heart failure: Secondary | ICD-10-CM

## 2012-07-10 DIAGNOSIS — F17200 Nicotine dependence, unspecified, uncomplicated: Secondary | ICD-10-CM

## 2012-07-10 DIAGNOSIS — E7800 Pure hypercholesterolemia, unspecified: Secondary | ICD-10-CM

## 2012-07-10 DIAGNOSIS — Z720 Tobacco use: Secondary | ICD-10-CM

## 2012-07-10 DIAGNOSIS — I2510 Atherosclerotic heart disease of native coronary artery without angina pectoris: Secondary | ICD-10-CM

## 2012-07-10 LAB — HEPATIC FUNCTION PANEL
ALT: 10 U/L (ref 0–35)
AST: 13 U/L (ref 0–37)
Total Bilirubin: 0.5 mg/dL (ref 0.3–1.2)

## 2012-07-10 LAB — BASIC METABOLIC PANEL
BUN: 21 mg/dL (ref 6–23)
Calcium: 9 mg/dL (ref 8.4–10.5)
GFR: 75.57 mL/min (ref >60.00)
Glucose, Bld: 96 mg/dL (ref 70–99)
Sodium: 139 mEq/L (ref 135–145)

## 2012-07-10 LAB — LIPID PANEL
Cholesterol: 135 mg/dL (ref 0–200)
HDL: 41.9 mg/dL (ref >39.00)

## 2012-07-10 NOTE — Assessment & Plan Note (Signed)
The patient has a history of hypercholesterolemia.  She is on Lipitor 40 mg daily.  She has not been having any myalgias.  We are checking lab work today.Marland Kitchen

## 2012-07-10 NOTE — Patient Instructions (Addendum)
Will obtain labs today and call you with the results (lp/bmet/hfp)  Your physician recommends that you continue on your current medications as directed. Please refer to the Current Medication list given to you today.  Your physician recommends that you schedule a follow-up appointment in: 4 months with fasting labs (lp/bmet/hfp)  

## 2012-07-10 NOTE — Assessment & Plan Note (Signed)
Unfortunately the patient continues to smoke against advice.  We talked again today about the importance of quitting smoking especially in view of her recent stroke

## 2012-07-10 NOTE — Progress Notes (Signed)
Quick Note:  Please report to patient. The recent labs are stable. Continue same medication and careful diet.TG and BS better. ______

## 2012-07-10 NOTE — Progress Notes (Signed)
Sara [name] Date of Birth:  January 21, 1941 [facility] [address] [place], [place]  [address] [phone]  Fax   [phone]  HPI: This pleasant 72 year old woman is seen for a scheduled followup office visit. She has a history of essential hypertension and a history of hypercholesterolemia. Cardiac catheterization in 1994 showed mild three-vessel coronary atherosclerosis without obstructing lesions. She had a normal adenosine Cardiolite stress test in 2009.  On 04/12/2012 the patient developed some tingling and clumsiness of her right hand.  She was taken to [facility].  She underwent a TEE on 04/12/12 which showed no intracardiac clot or mass but she did have severe aortic plaque.  She was discharged and then returned on 04/24/12 for a left carotid endarterectomy and background patch angioplasty by Dr. Arbie Cookey.  She has done well postoperatively.   Current Outpatient Prescriptions  Medication Sig Dispense Refill  . amLODipine (NORVASC) 5 MG tablet Take 2.5 mg by mouth daily.       Marland Kitchen aspirin 81 MG tablet Take 81 mg by mouth daily.      Marland Kitchen atorvastatin (LIPITOR) 40 MG tablet Take 20 mg by mouth daily.      . diazepam (VALIUM) 5 MG tablet Take 5 mg by mouth daily as needed. For anxiety      . gabapentin (NEURONTIN) 300 MG capsule Take 3 capsules (900 mg total) by mouth at bedtime.  270 capsule  3  . HYDROcodone-acetaminophen (NORCO/VICODIN) 5-325 MG per tablet Take 1 tablet by mouth every 6 (six) hours as needed for pain.  30 tablet  0  . metoprolol (LOPRESSOR) 50 MG tablet Take 25 mg by mouth 2 (two) times daily.      . nitroGLYCERIN (NITROSTAT) 0.4 MG SL tablet Place 0.4 mg under the tongue every 5 (five) minutes as needed.        Marland Kitchen omeprazole (PRILOSEC OTC) 20 MG tablet Take 20 mg by mouth daily.      . ondansetron (ZOFRAN) 4 MG tablet Take 1 tablet (4 mg total) by mouth daily as needed for nausea.  30 tablet  1  . valsartan (DIOVAN) 320 MG tablet Take 160 mg  by mouth daily.       No current facility-administered medications for this visit.    Allergies  Allergen Reactions  . Percocet (Oxycodone-Acetaminophen) Rash  . Hydrochlorothiazide     Pt does not remember  . Indomethacin     Pt does not remember  . Paroxetine Hcl     Pt does not remember  . Penicillins     Pt does not remember  . Pravachol     Pt does not remember  . Quinine Derivatives     Pt does not remember  . Wellbutrin (Bupropion)     agitation  . Zocor (Simvastatin - High Dose) Other (See Comments)    Weakness/no energy    Patient Active Problem List  Diagnosis  . Mitral valve regurgitation  . Hypertension  . Hypercholesteremia  . Coronary artery disease  . Arthritis  . Tobacco abuse  . Right sided weakness  . Aneurysm of right internal carotid artery  . CVA (cerebral infarction)  . Carotid stenosis, left  . Occlusion and stenosis of carotid artery without mention of cerebral infarction    History  Smoking status  . Current Every Day Smoker -- 0.50 packs/day for 60 years  . Types: Cigarettes  Smokeless tobacco  . Never Used    History  Alcohol Use No  Family History  Problem Relation Age of Onset  . Heart attack Father     Review of Systems: The patient denies any heat or cold intolerance.  No weight gain or weight loss.  The patient denies headaches or blurry vision.  There is no cough or sputum production.  The patient denies dizziness.  There is no hematuria or hematochezia.  The patient denies any muscle aches or arthritis.  The patient denies any rash.  The patient denies frequent falling or instability.  There is no history of depression or anxiety.  All other systems were reviewed and are negative.   Physical Exam: Filed Vitals:   07/10/12 0912  BP: 137/72  Pulse: 77   the general appearance reveals a well-developed well-nourished woman in no distress.  Her left carotid endarterectomy scar is healing nicelyThe head and neck exam  reveals pupils equal and reactive.  Extraocular movements are full.  There is no scleral icterus.  The mouth and pharynx are normal.  The neck is supple.  The carotids reveal no bruits.  The jugular venous pressure is normal.  The  thyroid is not enlarged.  There is no lymphadenopathy.  The chest is clear to percussion and auscultation.  There are no rales or rhonchi.  Expansion of the chest is symmetrical.  The precordium is quiet.  The first heart sound is normal.  The second heart sound is physiologically split.  There is no murmur gallop rub or click.  There is no abnormal lift or heave.  The abdomen is soft and nontender.  The bowel sounds are normal.  The liver and spleen are not enlarged.  There are no abdominal masses.  There are no abdominal bruits.  Extremities reveal good pedal pulses.  There is no phlebitis or edema.  There is no cyanosis or clubbing.  Strength is normal and symmetrical in all extremities.  There is no lateralizing weakness.  There are no sensory deficits.  The skin is warm and dry.  There is no rash.      Assessment / Plan:  Continue same medication.  Urged smoking cessation.  She is having a lot of chronic back pain and I've asked her to obtain a primary care provider to help her with those needs.  She may use [place] urgent care. Recheck here in 4 months for followup office visit lipid panel hepatic function panel and basal metabolic panel.

## 2012-07-10 NOTE — Assessment & Plan Note (Signed)
The patient has had no chest pain or angina pectoris.  She denies any increased dyspnea

## 2012-07-11 ENCOUNTER — Telehealth: Payer: Self-pay | Admitting: *Deleted

## 2012-07-11 NOTE — Telephone Encounter (Signed)
Message copied by Burnell Blanks on Tue Jul 11, 2012 10:42 AM ------      Message from: Cassell Clement      Created: Mon Jul 10, 2012  7:02 PM       Please report to patient.  The recent labs are stable. Continue same medication and careful diet.TG and BS better. ------

## 2012-07-11 NOTE — Telephone Encounter (Signed)
Advised patient of lab results  

## 2012-07-21 ENCOUNTER — Other Ambulatory Visit: Payer: Self-pay | Admitting: *Deleted

## 2012-07-21 DIAGNOSIS — M549 Dorsalgia, unspecified: Secondary | ICD-10-CM

## 2012-07-21 MED ORDER — METOPROLOL TARTRATE 50 MG PO TABS: 25.0000 mg | ORAL_TABLET | Freq: Two times a day (BID) | ORAL | Status: DC

## 2012-07-21 NOTE — Telephone Encounter (Signed)
Patient calling to have metoprolol tartrate 50mg , 25mg  bid and Hydrocdone refilled at [facility] in [place] on [address]. I let patient know I can refill Metoprolol now but will have to get Dr Yevonne Pax approval for the [facility]. She verbally agreed and said thank you.  Metoprolol Tartrate 50mg , 25 mg by mouth twice daily  #30, 5 RF   [employer], CMA

## 2012-07-22 MED ORDER — HYDROCODONE-ACETAMINOPHEN 5-325 MG PO TABS: 1.0000 | ORAL_TABLET | Freq: Four times a day (QID) | ORAL | Status: DC | PRN

## 2012-08-29 ENCOUNTER — Ambulatory Visit (INDEPENDENT_AMBULATORY_CARE_PROVIDER_SITE_OTHER): Payer: Medicare Other | Admitting: Family Medicine

## 2012-08-29 ENCOUNTER — Encounter: Payer: Self-pay | Admitting: Family Medicine

## 2012-08-29 ENCOUNTER — Other Ambulatory Visit: Payer: Self-pay | Admitting: *Deleted

## 2012-08-29 VITALS — BP 138/80 | HR 72 | Temp 98.1°F | Ht 65.25 in | Wt 143.2 lb

## 2012-08-29 DIAGNOSIS — M810 Age-related osteoporosis without current pathological fracture: Secondary | ICD-10-CM

## 2012-08-29 DIAGNOSIS — F17200 Nicotine dependence, unspecified, uncomplicated: Secondary | ICD-10-CM

## 2012-08-29 DIAGNOSIS — I6350 Cerebral infarction due to unspecified occlusion or stenosis of unspecified cerebral artery: Secondary | ICD-10-CM

## 2012-08-29 DIAGNOSIS — Z1331 Encounter for screening for depression: Secondary | ICD-10-CM

## 2012-08-29 DIAGNOSIS — M549 Dorsalgia, unspecified: Secondary | ICD-10-CM

## 2012-08-29 DIAGNOSIS — G8929 Other chronic pain: Secondary | ICD-10-CM

## 2012-08-29 DIAGNOSIS — Z1231 Encounter for screening mammogram for malignant neoplasm of breast: Secondary | ICD-10-CM

## 2012-08-29 DIAGNOSIS — E7800 Pure hypercholesterolemia, unspecified: Secondary | ICD-10-CM

## 2012-08-29 DIAGNOSIS — Z720 Tobacco use: Secondary | ICD-10-CM

## 2012-08-29 DIAGNOSIS — I639 Cerebral infarction, unspecified: Secondary | ICD-10-CM

## 2012-08-29 DIAGNOSIS — I10 Essential (primary) hypertension: Secondary | ICD-10-CM

## 2012-08-29 MED ORDER — HYDROCODONE-ACETAMINOPHEN 5-325 MG PO TABS: 1.0000 | ORAL_TABLET | Freq: Four times a day (QID) | ORAL | Status: DC | PRN

## 2012-08-29 NOTE — Patient Instructions (Addendum)
Schedule your complete physical in September at your convenience We'll notify you of your lab results and make any changes if needed We'll call you with your mammo and bone density appt Keep up the good work!  You look great! Welcome!  We're glad to have you!!

## 2012-08-29 NOTE — Progress Notes (Signed)
  Subjective:    Patient ID: Sara [name], female    DOB: 14-Oct-1940, 72 y.o.   MRN: [id number]  HPI New to establish.  Previous MD- [facility]  HTN- chronic problem, on Diovan, amlodipine, metoprolol.  No CP, SOB, HAs, visual changes, edema.  Hyperlipidemia- chronic problem, on Lipitor.  No abd pain, N/V, myalgias.  CVA- occurred 04/10/12, s/p CEA (Dr Early).  Has some residual weakness in R hand, difficulty writing.  No difficulty w/ walking or gait stability.  Tobacco use- smoking 1/2 ppd, 60+ yrs.  Pt reports she is 'trying' to cut back  Back spasms- s/p 4 back surgeries, has chronic pain.  On Valium prn for back spasm and Hydrocodone nightly for pain relief and help w/ sleeping.  Health maintenance- overdue for mammo, colonoscopy ([facility]), DEXA (pt reports hx of osteoporosis)   Review of Systems For ROS see HPI     Objective:   Physical Exam  Vitals reviewed. Constitutional: She is oriented to person, place, and time. She appears well-developed and well-nourished. No distress.  HENT:  Head: Normocephalic and atraumatic.  Eyes: Conjunctivae and EOM are normal. Pupils are equal, round, and reactive to light.  Neck: Normal range of motion. Neck supple. No thyromegaly present.  Cardiovascular: Normal rate, regular rhythm and intact distal pulses.   Murmur (II/VI SEM) heard. Pulmonary/Chest: Effort normal and breath sounds normal. No respiratory distress.  Abdominal: Soft. She exhibits no distension. There is no tenderness.  Musculoskeletal: She exhibits no edema.  Lymphadenopathy:    She has no cervical adenopathy.  Neurological: She is alert and oriented to person, place, and time.  Skin: Skin is warm and dry.  Psychiatric: She has a normal mood and affect. Her behavior is normal.          Assessment & Plan:

## 2012-08-31 ENCOUNTER — Other Ambulatory Visit: Payer: Self-pay | Admitting: *Deleted

## 2012-08-31 MED ORDER — AMLODIPINE BESYLATE 10 MG PO TABS: 5.0000 mg | ORAL_TABLET | Freq: Every day | ORAL | Status: DC

## 2012-09-01 LAB — VITAMIN D 1,25 DIHYDROXY: Vitamin D2 1, 25 (OH)2: <8 pg/mL

## 2012-09-05 ENCOUNTER — Encounter: Payer: Self-pay | Admitting: General Practice

## 2012-09-05 NOTE — Assessment & Plan Note (Addendum)
New to provider.  Check Vit D and get DEXA.  Will follow.

## 2012-09-05 NOTE — Assessment & Plan Note (Signed)
New to provider, recent for pt.  S/p CEA.  Residual R hand weakness.  Not following w/ neuro.  Goal is risk reduction- BP and cholesterol control.  Will follow.

## 2012-09-05 NOTE — Assessment & Plan Note (Signed)
New to provider, chronic for pt.  Adequate control.  Asymptomatic.  Will follow.

## 2012-09-05 NOTE — Assessment & Plan Note (Signed)
New to provider, chronic for pt.  Encouraged cessation.  Will follow.

## 2012-09-05 NOTE — Assessment & Plan Note (Signed)
New to provider, chronic for pt.  Pt completed controlled substance agreement.  Will assume prescribing role for pt's Valium and narcotics.

## 2012-09-05 NOTE — Assessment & Plan Note (Signed)
New to provider, chronic for pt.  Tolerating statin w/out difficulty.  Reviewed recent labs.  Will follow.

## 2012-09-14 ENCOUNTER — Encounter: Payer: Self-pay | Admitting: Family Medicine

## 2012-09-26 ENCOUNTER — Other Ambulatory Visit: Payer: Self-pay | Admitting: Family Medicine

## 2012-09-26 NOTE — Telephone Encounter (Signed)
Last OV 08-29-12, Hydrocodone filled same day #30 with 0 refills

## 2012-10-09 ENCOUNTER — Ambulatory Visit
Admission: RE | Admit: 2012-10-09 | Discharge: 2012-10-09 | Disposition: A | Discharge status: Home / Self Care | Payer: Medicare Other | Source: Ambulatory Visit | Attending: Family Medicine | Admitting: Family Medicine

## 2012-10-09 DIAGNOSIS — M810 Age-related osteoporosis without current pathological fracture: Secondary | ICD-10-CM

## 2012-10-09 DIAGNOSIS — Z1231 Encounter for screening mammogram for malignant neoplasm of breast: Secondary | ICD-10-CM

## 2012-10-24 ENCOUNTER — Telehealth: Payer: Self-pay | Admitting: *Deleted

## 2012-10-24 NOTE — Telephone Encounter (Signed)
Message copied by Sara [name] on Tue Oct 24, 2012  5:58 PM ------      Message from: Sheliah Hatch      Created: Mon Oct 09, 2012  4:13 PM       DEXA shows osteoporosis.  Should take Ca 1200 and Vit D 800 daily.  We can discuss starting an osteoporosis med like Fosamax weekly if pt is interested ------

## 2012-10-24 NOTE — Telephone Encounter (Signed)
Spoke with the pt and informed her of recent BDS results and note.  Pt understood and agreed.  Pt stated that she would like to start the Fosamax and when does she need to come in and discuss this with?  Pt would also like to discuss with you about taking her husband on as a pt.  He was in the hospital for brain aneurysm.  He does not have an PCP.  Please advise.//AB/CMA

## 2012-10-25 MED ORDER — ALENDRONATE SODIUM 70 MG PO TABS: 70.0000 mg | ORAL_TABLET | ORAL | Status: DC

## 2012-10-25 NOTE — Telephone Encounter (Signed)
Spoke with the pt and informed her that she will not need an OV and I will sent a new rx to her pharmacy by e-script.  Also informed her that Dr. Beverely Low is willing to take her husband on as a pt they will just need to schedule him an appt.  She stated that her husband will be in rehab for 2-3 weeks, but after he gets out of rehab she will call and schedule an appt for him.  Told the pt he will need to be put in as a new pt and f/u from rehab, which he will need an 30 min appt.   She agreed.//AB/CMA

## 2012-10-25 NOTE — Telephone Encounter (Signed)
Ok to start Fosamax 70mg  weekly, #12, 3 refills.  No need for OV to discuss We can see her husband as a pt- they will just need to schedule him an appt

## 2012-10-26 ENCOUNTER — Other Ambulatory Visit: Payer: Self-pay | Admitting: Family Medicine

## 2012-10-26 NOTE — Telephone Encounter (Signed)
Last refill:09-26-12 Last OV:08-29-12-has an appt for CPE:01-22-13 Please advise.//AB/CMA

## 2012-11-07 ENCOUNTER — Ambulatory Visit: Payer: Medicare Other | Admitting: Vascular Surgery

## 2012-11-07 ENCOUNTER — Other Ambulatory Visit: Payer: Medicare Other

## 2012-11-13 ENCOUNTER — Encounter: Payer: Self-pay | Admitting: Vascular Surgery

## 2012-11-14 ENCOUNTER — Encounter: Payer: Self-pay | Admitting: Vascular Surgery

## 2012-11-14 ENCOUNTER — Other Ambulatory Visit (INDEPENDENT_AMBULATORY_CARE_PROVIDER_SITE_OTHER): Payer: Medicare Other | Admitting: *Deleted

## 2012-11-14 ENCOUNTER — Ambulatory Visit (INDEPENDENT_AMBULATORY_CARE_PROVIDER_SITE_OTHER): Payer: Medicare Other | Admitting: Vascular Surgery

## 2012-11-14 DIAGNOSIS — Z48812 Encounter for surgical aftercare following surgery on the circulatory system: Secondary | ICD-10-CM

## 2012-11-14 DIAGNOSIS — I6529 Occlusion and stenosis of unspecified carotid artery: Secondary | ICD-10-CM

## 2012-11-14 NOTE — Addendum Note (Signed)
Addended by: Adria Dill L on: 11/14/2012 04:10 PM   Modules accepted: Orders

## 2012-11-14 NOTE — Progress Notes (Signed)
The patient presents today for followup of her left carotid endarterectomy on 04/24/2012 for symptomatic carotid disease. She had preoperative right arm clumsiness and had MRI showing probable embolic stroke. She underwent uneventful endarterectomy is continued her usual recovery. She's had no further deficits and reports that she feels that her arm is returned to its normal pre-stroke baseline. She does report some mild peri-incisional numbness. She has no new major medical difficulties. Her husband had a bleed from an intracranial aneurysm 6 weeks ago and continues to be hospitalized with rocky course.  Past Medical History  Diagnosis Date  . Mitral valve regurgitation     trace  . Hypercholesteremia   . Arthritis   . H/O hiatal hernia   . Stroke   . Coronary artery disease     sees Dr. Patty Sermons  . Hypertension     all managed by Dr. Haynes Dage bill  . Anxiety   . Bronchitis     hx of  . GERD (gastroesophageal reflux disease)     hx of  . Carotid artery occlusion     History  Substance Use Topics  . Smoking status: Current Every Day Smoker -- 0.50 packs/day for 60 years    Types: Cigarettes  . Smokeless tobacco: Never Used  . Alcohol Use: No    Family History  Problem Relation Age of Onset  . Heart attack Father     Allergies  Allergen Reactions  . Percocet (Oxycodone-Acetaminophen) Rash  . Hydrochlorothiazide     Pt does not remember  . Indomethacin     Pt does not remember  . Paroxetine Hcl     Pt does not remember  . Penicillins     Pt does not remember  . Pravachol     Pt does not remember  . Quinine Derivatives     Pt does not remember  . Wellbutrin (Bupropion)     agitation  . Zocor (Simvastatin - High Dose) Other (See Comments)    Weakness/no energy    Current outpatient prescriptions:alendronate (FOSAMAX) 70 MG tablet, Take 1 tablet (70 mg total) by mouth every 7 (seven) days. Take with a full glass of water on an empty stomach., Disp: 12 tablet, Rfl: 3;   amLODipine (NORVASC) 10 MG tablet, Take 0.5 tablets (5 mg total) by mouth daily., Disp: 30 tablet, Rfl: 3;  aspirin 81 MG tablet, Take 81 mg by mouth daily., Disp: , Rfl:  atorvastatin (LIPITOR) 40 MG tablet, Take 20 mg by mouth daily., Disp: , Rfl: ;  diazepam (VALIUM) 5 MG tablet, Take 5 mg by mouth daily as needed. For anxiety, Disp: , Rfl: ;  gabapentin (NEURONTIN) 300 MG capsule, Take 3 capsules (900 mg total) by mouth at bedtime., Disp: 270 capsule, Rfl: 3;  metoprolol (LOPRESSOR) 50 MG tablet, Take 0.5 tablets (25 mg total) by mouth 2 (two) times daily., Disp: 30 tablet, Rfl: 5 nitroGLYCERIN (NITROSTAT) 0.4 MG SL tablet, Place 0.4 mg under the tongue every 5 (five) minutes as needed.  , Disp: , Rfl: ;  omeprazole (PRILOSEC OTC) 20 MG tablet, Take 20 mg by mouth daily., Disp: , Rfl: ;  ondansetron (ZOFRAN) 4 MG tablet, Take 1 tablet (4 mg total) by mouth daily as needed for nausea., Disp: 30 tablet, Rfl: 1;  valsartan (DIOVAN) 320 MG tablet, Take 160 mg by mouth daily., Disp: , Rfl:  amLODipine (NORVASC) 5 MG tablet, Take 2.5 mg by mouth daily. , Disp: , Rfl: ;  HYDROcodone-acetaminophen (NORCO/VICODIN) 5-325 MG per tablet,  TAKE 1 TABLET BY MOUTH EVERY 6 HOURS AS NEEDED FOR PAIN, Disp: 30 tablet, Rfl: 0  BP 156/83  Pulse 78  Resp 18  Ht 5\' 5"  (1.651 m)  Wt 140 lb 11.2 oz (63.821 kg)  BMI 23.41 kg/m2  Body mass index is 23.41 kg/(m^2).       Physical exam: Well-developed well-nourished white female appearing stated age Grossly intact neurologically with equal grip strength bilaterally Respirations equal and nonlabored Carotid artery incision well-healed on the left with no carotid bruits bilaterally Skin without ulcers or rashes  Vascular lab study today: Carotid duplex revealed widely patent endarterectomy with no evidence of stenosis. She has mild thickening with less than 40% stenosis in her right internal carotid artery.  Impression and plan: Stable status post left carotid  endarterectomy for symptomatic disease December 2013. She will continue her usual activities. We'll see her again in 6 months with repeat carotid duplex

## 2012-11-15 ENCOUNTER — Ambulatory Visit (INDEPENDENT_AMBULATORY_CARE_PROVIDER_SITE_OTHER): Payer: Medicare Other | Admitting: Cardiology

## 2012-11-15 ENCOUNTER — Encounter: Payer: Self-pay | Admitting: Cardiology

## 2012-11-15 VITALS — BP 174/90 | HR 71 | Ht 65.0 in | Wt 140.0 lb

## 2012-11-15 DIAGNOSIS — F411 Generalized anxiety disorder: Secondary | ICD-10-CM

## 2012-11-15 DIAGNOSIS — I6529 Occlusion and stenosis of unspecified carotid artery: Secondary | ICD-10-CM

## 2012-11-15 DIAGNOSIS — I119 Hypertensive heart disease without heart failure: Secondary | ICD-10-CM

## 2012-11-15 DIAGNOSIS — F419 Anxiety disorder, unspecified: Secondary | ICD-10-CM

## 2012-11-15 DIAGNOSIS — I10 Essential (primary) hypertension: Secondary | ICD-10-CM

## 2012-11-15 DIAGNOSIS — E7800 Pure hypercholesterolemia, unspecified: Secondary | ICD-10-CM

## 2012-11-15 LAB — HEPATIC FUNCTION PANEL
ALT: 13 U/L (ref 0–35)
AST: 13 U/L (ref 0–37)
Albumin: 3.6 g/dL (ref 3.5–5.2)
Total Protein: 6.6 g/dL (ref 6.0–8.3)

## 2012-11-15 LAB — LIPID PANEL
Cholesterol: 130 mg/dL (ref 0–200)
HDL: 44.1 mg/dL (ref >39.00)
Triglycerides: 97 mg/dL (ref 0.0–149.0)
VLDL: 19.4 mg/dL (ref 0.0–40.0)

## 2012-11-15 LAB — BASIC METABOLIC PANEL
Calcium: 9.3 mg/dL (ref 8.4–10.5)
GFR: 73.35 mL/min (ref >60.00)
Potassium: 4.4 mEq/L (ref 3.5–5.1)
Sodium: 142 mEq/L (ref 135–145)

## 2012-11-15 MED ORDER — DIAZEPAM 5 MG PO TABS: 5.0000 mg | ORAL_TABLET | Freq: Every day | ORAL | Status: DC | PRN

## 2012-11-15 MED ORDER — AMLODIPINE BESYLATE 10 MG PO TABS: 10.0000 mg | ORAL_TABLET | Freq: Every day | ORAL | Status: DC

## 2012-11-15 NOTE — Addendum Note (Signed)
Addended by: Regis Bill B on: 11/15/2012 09:34 AM   Modules accepted: Orders

## 2012-11-15 NOTE — Progress Notes (Signed)
Sara [name] Date of Birth:  04/01/1941 [facility] [address] [place], [place]  [address] [phone]         Fax   [phone]  History of Present Illness: This pleasant 72 year old woman is seen for a scheduled followup office visit. She has a history of essential hypertension and a history of hypercholesterolemia. Cardiac catheterization in 1994 showed mild three-vessel coronary atherosclerosis without obstructing lesions. She had a normal adenosine Cardiolite stress test in 2009. On 04/12/2012 the patient developed some tingling and clumsiness of her right hand. She was taken to [facility]. She underwent a TEE on 04/12/12 which showed no intracardiac clot or mass but she did have severe aortic plaque. She was discharged and then returned on 04/24/12 for a left carotid endarterectomy and background patch angioplasty by Dr. Arbie Cookey. She has done well postoperatively.  She had a recent followup checkup with Dr. Arbie Cookey who was pleased with her progress. Since last visit the patient has been under increased stress.  Her husband had a intracerebral stroke from ruptured intracerebral aneurysm and is still in [facility].  She expects to take him home in about a week.   Current Outpatient Prescriptions  Medication Sig Dispense Refill  . alendronate (FOSAMAX) 70 MG tablet Take 1 tablet (70 mg total) by mouth every 7 (seven) days. Take with a full glass of water on an empty stomach.  12 tablet  3  . amLODipine (NORVASC) 10 MG tablet Take 1 tablet (10 mg total) by mouth daily.  90 tablet  3  . aspirin 81 MG tablet Take 81 mg by mouth daily.      Marland Kitchen atorvastatin (LIPITOR) 40 MG tablet Take 20 mg by mouth daily.      . diazepam (VALIUM) 5 MG tablet Take 5 mg by mouth daily as needed. For anxiety      . gabapentin (NEURONTIN) 300 MG capsule Take 3 capsules (900 mg total) by mouth at bedtime.  270 capsule  3  . HYDROcodone-acetaminophen (NORCO/VICODIN) 5-325 MG  per tablet TAKE 1 TABLET BY MOUTH EVERY 6 HOURS AS NEEDED FOR PAIN  30 tablet  0  . metoprolol (LOPRESSOR) 50 MG tablet Take 0.5 tablets (25 mg total) by mouth 2 (two) times daily.  30 tablet  5  . nitroGLYCERIN (NITROSTAT) 0.4 MG SL tablet Place 0.4 mg under the tongue every 5 (five) minutes as needed.        Marland Kitchen omeprazole (PRILOSEC OTC) 20 MG tablet Take 20 mg by mouth daily.      . ondansetron (ZOFRAN) 4 MG tablet Take 1 tablet (4 mg total) by mouth daily as needed for nausea.  30 tablet  1  . valsartan (DIOVAN) 320 MG tablet Take 160 mg by mouth daily.       No current facility-administered medications for this visit.    Allergies  Allergen Reactions  . Percocet (Oxycodone-Acetaminophen) Rash  . Hydrochlorothiazide     Pt does not remember  . Indomethacin     Pt does not remember  . Paroxetine Hcl     Pt does not remember  . Penicillins     Pt does not remember  . Pravachol     Pt does not remember  . Quinine Derivatives     Pt does not remember  . Wellbutrin (Bupropion)     agitation  . Zocor (Simvastatin - High Dose) Other (See Comments)    Weakness/no energy    Patient Active Problem  List   Diagnosis Date Noted  . Coronary artery disease     Priority: High  . Arthritis 08/05/2010    Priority: Medium  . Tobacco abuse 08/05/2010    Priority: Medium  . Hypertension     Priority: Medium  . 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 Chronic back pain 08/29/2012  . Occlusion and stenosis of carotid artery without mention of cerebral infarction 05/09/2012  . Carotid stenosis, left 04/13/2012  . 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 CVA (cerebral infarction) 04/11/2012  . Right sided weakness 04/10/2012  . Mitral valve regurgitation   . Hypercholesteremia     History  Smoking status  . Current Every Day Smoker -- 0.50 packs/day for 60 years  . Types: Cigarettes  Smokeless tobacco  . Never Used    History  Alcohol Use No    Family  History  Problem Relation Age of Onset  . Heart attack Father     Review of Systems: Constitutional: no fever chills diaphoresis or fatigue or change in weight.  Head and neck: no hearing loss, no epistaxis, no photophobia or visual disturbance. Respiratory: No cough, shortness of breath or wheezing. Cardiovascular: No chest pain peripheral edema, palpitations. Gastrointestinal: No abdominal distention, no abdominal pain, no change in bowel habits hematochezia or melena. Genitourinary: No dysuria, no frequency, no urgency, no nocturia. Musculoskeletal:No arthralgias, no back pain, no gait disturbance or myalgias. Neurological: No dizziness, no headaches, no numbness, no seizures, no syncope, no weakness, no tremors. Hematologic: No lymphadenopathy, no easy bruising. Psychiatric: No confusion, no hallucinations, no sleep disturbance.    Physical Exam: Filed Vitals:   11/15/12 0850  BP: 174/90  Pulse: 71   the general appearance reveals a well-developed well-nourished woman in no distress.The head and neck exam reveals pupils equal and reactive.  Extraocular movements are full.  There is no scleral icterus.  The mouth and pharynx are normal.  The neck is supple.  The carotids reveal no bruits.  The jugular venous pressure is normal.  The  thyroid is not enlarged.  There is no lymphadenopathy.  The chest is clear to percussion and auscultation.  There are no rales or rhonchi.  Expansion of the chest is symmetrical.  The precordium is quiet.  The first heart sound is normal.  The second heart sound is physiologically split.  There is no murmur gallop rub or click.  There is no abnormal lift or heave.  The abdomen is soft and nontender.  The bowel sounds are normal.  The liver and spleen are not enlarged.  There are no abdominal masses.  There are no abdominal bruits.  Extremities reveal good pedal pulses.  There is no phlebitis or edema.  There is no cyanosis or clubbing.  Strength is normal and  symmetrical in all extremities.  There is no lateralizing weakness.  There are no sensory deficits.  The skin is warm and dry.  There is no rash.     Assessment / Plan: Continue same medication except increase amlodipine to a full 10 mg daily.  Recheck in 4 months for followup office visit lipid panel hepatic function panel and basal metabolic panel.

## 2012-11-15 NOTE — Assessment & Plan Note (Signed)
Blood pressure today is higher.  She attributes this to the stress of her husband being hospitalized.  Nonetheless we do want to have better blood pressure control and we will increase her amlodipine from 5 mg up to 10 mg daily.

## 2012-11-15 NOTE — Assessment & Plan Note (Signed)
Patient has history of dyslipidemia.  We are checking fasting lab work today.  She is not having any myalgias from her 20 mg  Lipitor

## 2012-11-15 NOTE — Assessment & Plan Note (Signed)
The patient is not having any TIA symptoms. 

## 2012-11-15 NOTE — Progress Notes (Signed)
Quick Note:  Please report to patient. The recent labs are stable. Continue same medication and careful diet. ______ 

## 2012-11-15 NOTE — Patient Instructions (Signed)
Will obtain labs today and call you with the results (lp/bmet/hfp)  INCREASE YOUR AMLODIPINE TO 10 MG (FULL TABLET) DAILY  Your physician wants you to follow-up in: 4 months with fasting labs (lp/bmet/hfp) You will receive a reminder letter in the mail two months in advance. If you don't receive a letter, please call our office to schedule the follow-up appointment.

## 2012-11-16 ENCOUNTER — Telehealth: Payer: Self-pay | Admitting: *Deleted

## 2012-11-16 NOTE — Telephone Encounter (Signed)
Advised patient of lab results  

## 2012-11-16 NOTE — Telephone Encounter (Signed)
Message copied by Burnell Blanks on Thu Nov 16, 2012  1:34 PM ------      Message from: Cassell Clement      Created: Wed Nov 15, 2012  5:01 PM       Please report to patient.  The recent labs are stable. Continue same medication and careful diet. ------

## 2012-11-29 ENCOUNTER — Telehealth: Payer: Self-pay | Admitting: *Deleted

## 2012-11-29 MED ORDER — HYDROCODONE-ACETAMINOPHEN 5-325 MG PO TABS: ORAL_TABLET | ORAL | Status: DC

## 2012-11-29 NOTE — Telephone Encounter (Signed)
Get UDS Ok #30

## 2012-11-29 NOTE — Telephone Encounter (Signed)
Pharmacy's is requesting a refill for Hydocodone 5-325 mg last ov 08/29/12 last date filled 10/26/12 #30 0 R patient has a controlled substance contract on file no UDS . This is a [facility] patient please Advise.

## 2012-11-29 NOTE — Telephone Encounter (Signed)
Spoke with patient, made aware of need to pick up rx. Front desk made aware of need for UDS

## 2012-12-19 ENCOUNTER — Encounter: Payer: Self-pay | Admitting: Family Medicine

## 2012-12-27 ENCOUNTER — Other Ambulatory Visit: Payer: Self-pay | Admitting: Internal Medicine

## 2012-12-27 NOTE — Telephone Encounter (Signed)
Last OV 08-29-12 ([facility]) Med last filled 11-29-12 #30 with 0 refills  Low risk

## 2012-12-28 NOTE — Telephone Encounter (Signed)
Ok for #30 

## 2012-12-29 ENCOUNTER — Other Ambulatory Visit: Payer: Self-pay | Admitting: *Deleted

## 2012-12-29 NOTE — Telephone Encounter (Signed)
Rx filled. SW, CMA 

## 2013-01-18 ENCOUNTER — Other Ambulatory Visit: Payer: Self-pay | Admitting: Cardiology

## 2013-01-19 ENCOUNTER — Telehealth: Payer: Self-pay

## 2013-01-19 NOTE — Telephone Encounter (Signed)
HM not UTD: patient states there are no other records to obtain. Was unable to give dates or information on HM items.  No records of mammogram, colonoscopy, or immunization.  Meds reconciled, pharmacy and allergies verified.

## 2013-01-22 ENCOUNTER — Encounter: Payer: Self-pay | Admitting: Family Medicine

## 2013-01-22 ENCOUNTER — Ambulatory Visit (INDEPENDENT_AMBULATORY_CARE_PROVIDER_SITE_OTHER): Payer: Medicare Other | Admitting: Family Medicine

## 2013-01-22 VITALS — BP 130/84 | HR 72 | Temp 98.3°F | Ht 65.25 in | Wt 142.2 lb

## 2013-01-22 DIAGNOSIS — M810 Age-related osteoporosis without current pathological fracture: Secondary | ICD-10-CM

## 2013-01-22 DIAGNOSIS — Z636 Dependent relative needing care at home: Secondary | ICD-10-CM | POA: Insufficient documentation

## 2013-01-22 DIAGNOSIS — N760 Acute vaginitis: Secondary | ICD-10-CM | POA: Insufficient documentation

## 2013-01-22 DIAGNOSIS — Z0000 Encounter for general adult medical examination without abnormal findings: Secondary | ICD-10-CM

## 2013-01-22 DIAGNOSIS — E7800 Pure hypercholesterolemia, unspecified: Secondary | ICD-10-CM

## 2013-01-22 DIAGNOSIS — Z6379 Other stressful life events affecting family and household: Secondary | ICD-10-CM

## 2013-01-22 DIAGNOSIS — I10 Essential (primary) hypertension: Secondary | ICD-10-CM

## 2013-01-22 MED ORDER — FLUCONAZOLE 150 MG PO TABS: 150.0000 mg | ORAL_TABLET | Freq: Once | ORAL | Status: DC

## 2013-01-22 MED ORDER — SERTRALINE HCL 25 MG PO TABS: 25.0000 mg | ORAL_TABLET | Freq: Every day | ORAL | Status: DC

## 2013-01-22 MED ORDER — HYDROCODONE-ACETAMINOPHEN 5-325 MG PO TABS: ORAL_TABLET | ORAL | Status: DC

## 2013-01-22 NOTE — Assessment & Plan Note (Signed)
New.  Start low dose SSRI.  Discussed need to find stress outlet.  Will follow.

## 2013-01-22 NOTE — Progress Notes (Signed)
  Subjective:    Patient ID: Sara [name], female    DOB: 1940-05-16, 72 y.o.   MRN: [id number]  HPI Here today for CPE.  Risk Factors: HTN- chronic problem, adequate control today on Norvasc, Metoprolol, Diovan.  No CP, SOB, HAs, visual changes, edema. Hyperlipidemia- chronic problem, on Lipitor 40mg .  Recent labs show good control.  No abd pain, N/V, myalgias. Yeast- sxs started 'a couple of days ago'. + itching, thick vaginal discharge. 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 Hearing: normal to conversational tones and whispered voice at 6 ft ADL's: independent Cognitive: normal linear thought process, memory and attention intact Home Safety: safe at home Height, Weight, BMI, Visual Acuity: see vitals, vision corrected to 20/20 w/ glasses Counseling: UTD on mammo, DEXA, Pap.  Due for colonoscopy ([place]) Labs Ordered: See A&P Care Plan: See A&P    Review of Systems Patient reports no vision/ hearing changes, adenopathy,fever, weight change,  persistant/recurrent hoarseness , swallowing issues, chest pain, palpitations, edema, persistant/recurrent cough, hemoptysis, dyspnea (rest/exertional/paroxysmal nocturnal), gastrointestinal bleeding (melena, rectal bleeding), abdominal pain, significant heartburn, bowel changes, GU symptoms (dysuria, hematuria, incontinence), Gyn symptoms (abnormal  bleeding, pain),  syncope, focal weakness, memory loss, numbness & tingling, skin/hair/nail changes, abnormal bruising or bleeding.    Objective:   Physical Exam  General Appearance:    Alert, cooperative, no distress, appears stated age  Head:    Normocephalic, without obvious abnormality, atraumatic  Eyes:    PERRL, conjunctiva/corneas clear, EOM's intact, fundi    benign, both eyes  Ears:    Normal TM's and external ear canals, both  ears  Nose:   Nares normal, septum midline, mucosa normal, no drainage    or sinus tenderness  Throat:   Lips, mucosa, and tongue normal; teeth and gums normal  Neck:   Supple, symmetrical, trachea midline, no adenopathy;    Thyroid: no enlargement/tenderness/nodules  Back:     Symmetric, no curvature, ROM normal, no CVA tenderness  Lungs:     Clear to auscultation bilaterally, respirations unlabored  Chest Wall:    No tenderness or deformity   Heart:    Regular rate and rhythm, S1 and S2 normal, II/VI SEM murmur, no rub or gallop  Breast Exam:    Deferred to mammo  Abdomen:     Soft, non-tender, bowel sounds active all four quadrants,    no masses, no organomegaly  Genitalia:    External genitalia normal, mucosa pink and moist, no lesions, + external irritation consistent w/ yeast  Rectal:    Normal external appearance  Extremities:   Extremities normal, atraumatic, no cyanosis or edema  Pulses:   2+ and symmetric all extremities  Skin:   Skin color, texture, turgor normal, no rashes or lesions  Lymph nodes:   Cervical, supraclavicular, and axillary nodes normal  Neurologic:   CNII-XII intact, normal strength, sensation and reflexes    throughout          Assessment & Plan:

## 2013-01-22 NOTE — Assessment & Plan Note (Signed)
UTD on DEXA.  Check Vit D level.

## 2013-01-22 NOTE — Assessment & Plan Note (Signed)
New.  Start diflucan.  Pt to hold statin x5 days to avoid medication rxn.  Pt expressed understanding and is in agreement w/ plan.

## 2013-01-22 NOTE — Assessment & Plan Note (Signed)
Pt's PE WNL w/ exception of known heart murmur and new yeast.  UTD on health maintenance w/ exception of colonoscopy which pt is not able to logistically complete at this time (husband unable to drive and she is caregiver).  Check labs that were not done by Dr Patty Sermons.  Anticipatory guidance provided.

## 2013-01-22 NOTE — Assessment & Plan Note (Signed)
Chronic problem.  Tolerating statin w/out difficulty.  Well controlled based on recent labs.  No med changes.

## 2013-01-22 NOTE — Patient Instructions (Addendum)
Follow up in 1 month to recheck mood Start the Zoloft daily at dinner We'll notify you of your lab results Start the Diflucan x1 dose for the yeast- hold the Lipitor x5 days and then restart Call with any questions or concerns Hang in there!!

## 2013-01-22 NOTE — Assessment & Plan Note (Signed)
Chronic problem.  Well controlled.  Asymptomatic.  Reviewed recent labs.  No med changes. 

## 2013-01-23 LAB — TSH: TSH: 0.83 u[IU]/mL (ref 0.35–5.50)

## 2013-01-25 ENCOUNTER — Encounter: Payer: Self-pay | Admitting: General Practice

## 2013-01-25 LAB — VITAMIN D 1,25 DIHYDROXY
Vitamin D 1, 25 (OH)2 Total: 45 pg/mL (ref 18–72)
Vitamin D2 1, 25 (OH)2: <8 pg/mL

## 2013-02-19 ENCOUNTER — Other Ambulatory Visit: Payer: Self-pay | Admitting: Cardiology

## 2013-02-21 ENCOUNTER — Ambulatory Visit (INDEPENDENT_AMBULATORY_CARE_PROVIDER_SITE_OTHER): Payer: Medicare Other | Admitting: Family Medicine

## 2013-02-21 ENCOUNTER — Encounter: Payer: Self-pay | Admitting: Family Medicine

## 2013-02-21 VITALS — BP 120/82 | HR 72 | Temp 98.5°F | Resp 16 | Wt 142.0 lb

## 2013-02-21 DIAGNOSIS — Z6379 Other stressful life events affecting family and household: Secondary | ICD-10-CM

## 2013-02-21 DIAGNOSIS — Z636 Dependent relative needing care at home: Secondary | ICD-10-CM

## 2013-02-21 NOTE — Assessment & Plan Note (Signed)
Much improved.  Feels Zoloft is working and sxs have greatly improved.  Applauded pt's decision to 'let go'.  Will follow.

## 2013-02-21 NOTE — Patient Instructions (Signed)
Follow up in 6 months to recheck cholesterol and BP Keep up the good work!  You look great! I'm so glad you're feeling better!!! Continue the Zoloft Enjoy the Holidays- but don't work too hard!

## 2013-02-21 NOTE — Progress Notes (Signed)
  Subjective:    Patient ID: Sara [name], female    DOB: [birth year], 72 y.o.   MRN: [id number]  HPI Caregiver stress- 'i'm doing great!  i've learned to let go'.  Husband is becoming more independent.  Less constant pressure on pt.  Zoloft has been big help.  Sleeping well- 'much better'.  Pt feels as if a 'weight has been lifted'.  No longer tearful.   Review of Systems For ROS see HPI     Objective:   Physical Exam  Vitals reviewed. Constitutional: She is oriented to person, place, and time. She appears well-developed and well-nourished. No distress.  HENT:  Head: Normocephalic and atraumatic.  Neurological: She is alert and oriented to person, place, and time.  Skin: Skin is warm and dry.  Psychiatric: She has a normal mood and affect. Her behavior is normal. Thought content normal.          Assessment & Plan:

## 2013-03-15 ENCOUNTER — Other Ambulatory Visit: Payer: Medicare Other

## 2013-03-15 ENCOUNTER — Ambulatory Visit (INDEPENDENT_AMBULATORY_CARE_PROVIDER_SITE_OTHER): Payer: Medicare Other | Admitting: Cardiology

## 2013-03-15 ENCOUNTER — Encounter: Payer: Self-pay | Admitting: Cardiology

## 2013-03-15 VITALS — BP 130/79 | HR 64 | Ht 65.0 in | Wt 144.0 lb

## 2013-03-15 DIAGNOSIS — F17200 Nicotine dependence, unspecified, uncomplicated: Secondary | ICD-10-CM

## 2013-03-15 DIAGNOSIS — I259 Chronic ischemic heart disease, unspecified: Secondary | ICD-10-CM

## 2013-03-15 DIAGNOSIS — I6529 Occlusion and stenosis of unspecified carotid artery: Secondary | ICD-10-CM

## 2013-03-15 DIAGNOSIS — Z720 Tobacco use: Secondary | ICD-10-CM

## 2013-03-15 DIAGNOSIS — I10 Essential (primary) hypertension: Secondary | ICD-10-CM

## 2013-03-15 DIAGNOSIS — E7800 Pure hypercholesterolemia, unspecified: Secondary | ICD-10-CM

## 2013-03-15 DIAGNOSIS — I119 Hypertensive heart disease without heart failure: Secondary | ICD-10-CM

## 2013-03-15 LAB — LIPID PANEL: Total CHOL/HDL Ratio: 3

## 2013-03-15 LAB — HEPATIC FUNCTION PANEL
ALT: 17 U/L (ref 0–35)
AST: 17 U/L (ref 0–37)
Bilirubin, Direct: 0.1 mg/dL (ref 0.0–0.3)
Total Bilirubin: 0.6 mg/dL (ref 0.3–1.2)

## 2013-03-15 LAB — BASIC METABOLIC PANEL
BUN: 20 mg/dL (ref 6–23)
CO2: 26 mEq/L (ref 19–32)
GFR: 86.73 mL/min (ref >60.00)
Glucose, Bld: 103 mg/dL — ABNORMAL HIGH (ref 70–99)
Potassium: 4.7 mEq/L (ref 3.5–5.1)
Sodium: 140 mEq/L (ref 135–145)

## 2013-03-15 NOTE — Assessment & Plan Note (Signed)
The patient is not having any symptoms of CHF.  No headaches or dizzy spells.  No syncope.

## 2013-03-15 NOTE — Patient Instructions (Signed)
Will obtain labs today and call you with the results (LP/BMET/HFP)  Your physician recommends that you continue on your current medications as directed. Please refer to the Current Medication list given to you today.  Your physician recommends that you schedule a follow-up appointment in: 4 months with fasting labs (lp/bmet/hfp)  

## 2013-03-15 NOTE — Progress Notes (Signed)
Sara [name] Date of Birth:  12-20-1940 [address] [place], [place]  [address] [phone]         Fax   [phone]  History of Present Illness: This pleasant 72 year old woman is seen for a scheduled followup office visit. She has a history of essential hypertension and a history of hypercholesterolemia. Cardiac catheterization in 1994 showed mild three-vessel coronary atherosclerosis without obstructing lesions. She had a normal adenosine Cardiolite stress test in 2009. On 04/12/2012 the patient developed some tingling and clumsiness of her right hand. She was taken to [facility]. She underwent a TEE on 04/12/12 which showed no intracardiac clot or mass but she did have severe aortic plaque. She was discharged and then returned on 04/24/12 for a left carotid endarterectomy and background patch angioplasty by Dr. Arbie Cookey. She has done well postoperatively.  She had a recent followup checkup with Dr. Arbie Cookey who was pleased with her progress. Since last visit the patient has been under increased stress.  Her husband had an intracerebral hemorrhage.  Spent a long time in rehabilitation but is now home and is doing better.   Current Outpatient Prescriptions  Medication Sig Dispense Refill  . alendronate (FOSAMAX) 70 MG tablet Take 1 tablet (70 mg total) by mouth every 7 (seven) days. Take with a full glass of water on an empty stomach.  12 tablet  3  . amLODipine (NORVASC) 10 MG tablet Take 1 tablet (10 mg total) by mouth daily.  90 tablet  3  . aspirin 81 MG tablet Take 81 mg by mouth daily.      Marland Kitchen atorvastatin (LIPITOR) 40 MG tablet Take 20 mg by mouth daily.      . diazepam (VALIUM) 5 MG tablet Take 1 tablet (5 mg total) by mouth daily as needed. For anxiety  30 tablet  3  . gabapentin (NEURONTIN) 300 MG capsule Take 3 capsules (900 mg total) by mouth at bedtime.  270 capsule  3  . HYDROcodone-acetaminophen (NORCO/VICODIN) 5-325 MG per tablet TAKE 1 TABLET BY MOUTH  EVERY 6 HOURS AS NEEDED FOR PAIN  60 tablet  0  . metoprolol (LOPRESSOR) 50 MG tablet TAKE 1/2 TABLET TWICE A DAY  30 tablet  5  . nitroGLYCERIN (NITROSTAT) 0.4 MG SL tablet Place 0.4 mg under the tongue every 5 (five) minutes as needed.        Marland Kitchen omeprazole (PRILOSEC OTC) 20 MG tablet Take 20 mg by mouth daily.      . sertraline (ZOLOFT) 25 MG tablet Take 1 tablet (25 mg total) by mouth daily.  30 tablet  3  . valsartan (DIOVAN) 320 MG tablet TAKE 1 TABLET BY MOUTH EVERY DAY  30 tablet  0  . fluconazole (DIFLUCAN) 150 MG tablet Take 1 tablet (150 mg total) by mouth once.  1 tablet  0   No current facility-administered medications for this visit.    Allergies  Allergen Reactions  . Percocet [Oxycodone-Acetaminophen] Rash  . Hydrochlorothiazide     Pt does not remember  . Indomethacin     Pt does not remember  . Paroxetine Hcl     Pt does not remember  . Penicillins     Pt does not remember  . Pravachol     Pt does not remember  . Quinine Derivatives     Pt does not remember  . Wellbutrin [Bupropion]     agitation  . Zocor [Simvastatin - High Dose] Other (See Comments)  Weakness/no energy    Patient Active Problem List   Diagnosis Date Noted  . Coronary artery disease     Priority: High  . Arthritis 08/05/2010    Priority: Medium  . Tobacco abuse 08/05/2010    Priority: Medium  . Hypertension     Priority: Medium  . Routine general medical examination at a health care facility 01/22/2013  . Caregiver stress 01/22/2013  . Vaginitis and vulvovaginitis 01/22/2013  . 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 Chronic back pain 08/29/2012  . Occlusion and stenosis of carotid artery without mention of cerebral infarction 05/09/2012  . Carotid stenosis, left 04/13/2012  . 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 CVA (cerebral infarction) 04/11/2012  . Right sided weakness 04/10/2012  . Mitral valve regurgitation   . Hypercholesteremia     History    Smoking status  . Current Every Day Smoker -- 0.50 packs/day for 60 years  . Types: Cigarettes  Smokeless tobacco  . Never Used    History  Alcohol Use No    Family History  Problem Relation Age of Onset  . Heart attack Father     Review of Systems: Constitutional: no fever chills diaphoresis or fatigue or change in weight.  Head and neck: no hearing loss, no epistaxis, no photophobia or visual disturbance. Respiratory: No cough, shortness of breath or wheezing. Cardiovascular: No chest pain peripheral edema, palpitations. Gastrointestinal: No abdominal distention, no abdominal pain, no change in bowel habits hematochezia or melena. Genitourinary: No dysuria, no frequency, no urgency, no nocturia. Musculoskeletal:No arthralgias, no back pain, no gait disturbance or myalgias. Neurological: No dizziness, no headaches, no numbness, no seizures, no syncope, no weakness, no tremors. Hematologic: No lymphadenopathy, no easy bruising. Psychiatric: No confusion, no hallucinations, no sleep disturbance.    Physical Exam: Filed Vitals:   03/15/13 0912  BP: 130/79  Pulse: 64   the general appearance reveals a well-developed well-nourished woman in no distress.The head and neck exam reveals pupils equal and reactive.  Extraocular movements are full.  There is no scleral icterus.  The mouth and pharynx are normal.  The neck is supple.  The carotids reveal no bruits.  The jugular venous pressure is normal.  The  thyroid is not enlarged.  There is no lymphadenopathy.  The chest is clear to percussion and auscultation.  There are no rales or rhonchi.  Expansion of the chest is symmetrical.  The precordium is quiet.  The first heart sound is normal.  The second heart sound is physiologically split.  There is no murmur gallop rub or click.  There is no abnormal lift or heave.  The abdomen is soft and nontender.  The bowel sounds are normal.  The liver and spleen are not enlarged.  There are no  abdominal masses.  There are no abdominal bruits.  Extremities reveal good pedal pulses.  There is no phlebitis or edema.  There is no cyanosis or clubbing.  Strength is normal and symmetrical in all extremities.  There is no lateralizing weakness.  There are no sensory deficits.  The skin is warm and dry.  There is no rash.     Assessment / Plan: Continue same medication.  Overall she is doing well.  She is now on Fosamax from her PCP for osteoporosis.  She is tolerating without side effects. Recheck here in 4 months for office visit lipid panel hepatic function panel and basal metabolic panel.

## 2013-03-15 NOTE — Assessment & Plan Note (Signed)
The patient is still smoking about half a pack a day.  She denies any cough or respiratory symptoms.  Have encouraged her to cut back.

## 2013-03-15 NOTE — Assessment & Plan Note (Signed)
The patient is not having any TIA spells.

## 2013-03-15 NOTE — Assessment & Plan Note (Signed)
The patient is not having any side effects from her Lipitor 40 mg daily.  We are checking lab work today.

## 2013-03-16 NOTE — Progress Notes (Signed)
Quick Note:  Please report to patient. The recent labs are stable. Continue same medication and careful diet. ______ 

## 2013-03-27 ENCOUNTER — Other Ambulatory Visit: Payer: Self-pay | Admitting: *Deleted

## 2013-03-27 MED ORDER — HYDROCODONE-ACETAMINOPHEN 5-325 MG PO TABS: ORAL_TABLET | ORAL | Status: DC

## 2013-03-27 NOTE — Telephone Encounter (Signed)
Last seen-02/21/2013  Last filled-01/22/2013  UDS-11/30/2012 contract signed  Please advise. SW

## 2013-04-18 ENCOUNTER — Other Ambulatory Visit: Payer: Self-pay | Admitting: Cardiology

## 2013-04-20 ENCOUNTER — Telehealth: Payer: Self-pay | Admitting: *Deleted

## 2013-04-20 DIAGNOSIS — G5793 Unspecified mononeuropathy of bilateral lower limbs: Secondary | ICD-10-CM

## 2013-04-20 NOTE — Telephone Encounter (Signed)
Wants gabapentin refill

## 2013-04-20 NOTE — Telephone Encounter (Signed)
Pt wants refill on gabapentin.

## 2013-04-24 MED ORDER — GABAPENTIN 300 MG PO CAPS: 900.0000 mg | ORAL_CAPSULE | Freq: Every day | ORAL | Status: DC

## 2013-04-24 MED ORDER — GABAPENTIN 300 MG PO CAPS: ORAL_CAPSULE | ORAL | Status: DC

## 2013-04-24 NOTE — Telephone Encounter (Signed)
Refilled as requested, verified dose with patient

## 2013-05-10 ENCOUNTER — Other Ambulatory Visit: Payer: Self-pay | Admitting: Cardiology

## 2013-05-12 ENCOUNTER — Other Ambulatory Visit: Payer: Self-pay | Admitting: Cardiology

## 2013-05-20 ENCOUNTER — Other Ambulatory Visit: Payer: Self-pay | Admitting: Family Medicine

## 2013-05-21 ENCOUNTER — Encounter: Payer: Self-pay | Admitting: Vascular Surgery

## 2013-05-21 NOTE — Telephone Encounter (Signed)
Last OV 02-21-13 Med filled 01-22-13 #30 with 3

## 2013-05-22 ENCOUNTER — Encounter: Payer: Self-pay | Admitting: Vascular Surgery

## 2013-05-22 ENCOUNTER — Ambulatory Visit (HOSPITAL_COMMUNITY)
Admission: RE | Admit: 2013-05-22 | Discharge: 2013-05-22 | Disposition: A | Discharge status: Home / Self Care | Payer: Medicare Other | Source: Ambulatory Visit | Attending: Vascular Surgery | Admitting: Vascular Surgery

## 2013-05-22 ENCOUNTER — Ambulatory Visit (INDEPENDENT_AMBULATORY_CARE_PROVIDER_SITE_OTHER): Payer: Medicare Other | Admitting: Vascular Surgery

## 2013-05-22 VITALS — BP 141/65 | HR 72 | Ht 65.0 in | Wt 147.3 lb

## 2013-05-22 DIAGNOSIS — I6529 Occlusion and stenosis of unspecified carotid artery: Secondary | ICD-10-CM

## 2013-05-22 DIAGNOSIS — Z48812 Encounter for surgical aftercare following surgery on the circulatory system: Secondary | ICD-10-CM

## 2013-05-22 NOTE — Progress Notes (Signed)
Patient presents today for continued followup of carotid disease. She underwent carotid endarterectomy for severe asymptomatic carotid disease in December of 2013. He had extensive plaque in her common carotid artery and required extension down towards the sternal notch in her carotid artery. He had no postoperative difficulty and has had no neurologic deficits. She reports no new major medical difficulties.  Past Medical History  Diagnosis Date  . Mitral valve regurgitation     trace  . Hypercholesteremia   . Arthritis   . H/O hiatal hernia   . Stroke   . Coronary artery disease     sees Dr. Patty SermonsBrackbill  . Hypertension     all managed by Dr. Haynes DageBrack bill  . Anxiety   . Bronchitis     hx of  . GERD (gastroesophageal reflux disease)     hx of  . Carotid artery occlusion     History  Substance Use Topics  . Smoking status: Current Every Day Smoker -- 0.50 packs/day for 60 years    Types: Cigarettes  . Smokeless tobacco: Never Used  . Alcohol Use: No    Family History  Problem Relation Age of Onset  . Heart attack Father   . Heart disease Father     before age 75    Allergies  Allergen Reactions  . Percocet [Oxycodone-Acetaminophen] Rash  . Hydrochlorothiazide     Pt does not remember  . Indomethacin     Pt does not remember  . Paroxetine Hcl     Pt does not remember  . Penicillins     Pt does not remember  . Pravachol     Pt does not remember  . Quinine Derivatives     Pt does not remember  . Wellbutrin [Bupropion]     agitation  . Zocor [Simvastatin - High Dose] Other (See Comments)    Weakness/no energy    Current outpatient prescriptions:alendronate (FOSAMAX) 70 MG tablet, Take 1 tablet (70 mg total) by mouth every 7 (seven) days. Take with a full glass of water on an empty stomach., Disp: 12 tablet, Rfl: 3;  amLODipine (NORVASC) 10 MG tablet, Take 1 tablet (10 mg total) by mouth daily., Disp: 90 tablet, Rfl: 3;  amLODipine (NORVASC) 10 MG tablet, Take 1 tablet  (10 mg total) by mouth daily., Disp: 30 tablet, Rfl: 1 aspirin 81 MG tablet, Take 81 mg by mouth daily., Disp: , Rfl: ;  atorvastatin (LIPITOR) 40 MG tablet, Take 0.5 tablets (20 mg total) by mouth daily., Disp: 30 tablet, Rfl: 2;  diazepam (VALIUM) 5 MG tablet, Take 1 tablet (5 mg total) by mouth daily as needed. For anxiety, Disp: 30 tablet, Rfl: 3;  fluconazole (DIFLUCAN) 150 MG tablet, Take 1 tablet (150 mg total) by mouth once., Disp: 1 tablet, Rfl: 0 gabapentin (NEURONTIN) 300 MG capsule, 3 tablet daily, Disp: 270 capsule, Rfl: 3;  HYDROcodone-acetaminophen (NORCO/VICODIN) 5-325 MG per tablet, TAKE 1 TABLET BY MOUTH EVERY 6 HOURS AS NEEDED FOR PAIN, Disp: 60 tablet, Rfl: 0;  metoprolol (LOPRESSOR) 50 MG tablet, TAKE 1/2 TABLET TWICE A DAY, Disp: 30 tablet, Rfl: 5;  nitroGLYCERIN (NITROSTAT) 0.4 MG SL tablet, Place 0.4 mg under the tongue every 5 (five) minutes as needed.  , Disp: , Rfl:  omeprazole (PRILOSEC OTC) 20 MG tablet, Take 20 mg by mouth daily., Disp: , Rfl: ;  sertraline (ZOLOFT) 25 MG tablet, TAKE 1 TABLET (25 MG TOTAL) BY MOUTH DAILY., Disp: 30 tablet, Rfl: 3;  valsartan (DIOVAN) 320 MG  tablet, TAKE 1 TABLET BY MOUTH EVERY DAY, Disp: 30 tablet, Rfl: 4  BP 141/65  Pulse 72  Ht 5\' 5"  (1.651 m)  Wt 147 lb 4.8 oz (66.815 kg)  BMI 24.51 kg/m2  SpO2 98%  Body mass index is 24.51 kg/(m^2).       Physical exam well-developed well-nourished female in no acute distress Carotid incision is well-healed left and she has no bruits bilaterally Radial pulses are 2+ bilaterally She is grossly intact neurologically Respirations are equal and nonlabored  Duplex today was evaluated by myself. This shows no evidence of stenosis in her endarterectomy site on the left. She has had no significant stenosis in the right carotid system.  Depression and plan stable status post extensive endarterectomy of left common and internal carotid artery December 2013. She will continue her usual activities  and see [name] again in one year with repeat carotid duplex

## 2013-05-22 NOTE — Addendum Note (Signed)
Addended by: Adria DillELDRIDGE-LEWIS, Srijan Givan L on: 05/22/2013 04:55 PM   Modules accepted: Orders

## 2013-05-23 ENCOUNTER — Other Ambulatory Visit: Payer: Self-pay | Admitting: General Practice

## 2013-05-23 MED ORDER — HYDROCODONE-ACETAMINOPHEN 5-325 MG PO TABS: ORAL_TABLET | ORAL | Status: DC

## 2013-07-17 ENCOUNTER — Other Ambulatory Visit: Payer: Self-pay | Admitting: Cardiology

## 2013-07-18 ENCOUNTER — Other Ambulatory Visit: Payer: Medicare Other

## 2013-07-18 ENCOUNTER — Encounter: Payer: Self-pay | Admitting: Cardiology

## 2013-07-18 ENCOUNTER — Ambulatory Visit (INDEPENDENT_AMBULATORY_CARE_PROVIDER_SITE_OTHER): Payer: Medicare Other | Admitting: Cardiology

## 2013-07-18 VITALS — BP 124/67 | HR 73 | Ht 65.0 in | Wt 151.0 lb

## 2013-07-18 DIAGNOSIS — Z636 Dependent relative needing care at home: Secondary | ICD-10-CM

## 2013-07-18 DIAGNOSIS — G609 Hereditary and idiopathic neuropathy, unspecified: Secondary | ICD-10-CM

## 2013-07-18 DIAGNOSIS — I259 Chronic ischemic heart disease, unspecified: Secondary | ICD-10-CM

## 2013-07-18 DIAGNOSIS — I119 Hypertensive heart disease without heart failure: Secondary | ICD-10-CM

## 2013-07-18 DIAGNOSIS — E7800 Pure hypercholesterolemia, unspecified: Secondary | ICD-10-CM

## 2013-07-18 DIAGNOSIS — I6529 Occlusion and stenosis of unspecified carotid artery: Secondary | ICD-10-CM

## 2013-07-18 DIAGNOSIS — I10 Essential (primary) hypertension: Secondary | ICD-10-CM

## 2013-07-18 DIAGNOSIS — G5793 Unspecified mononeuropathy of bilateral lower limbs: Secondary | ICD-10-CM

## 2013-07-18 DIAGNOSIS — Z6379 Other stressful life events affecting family and household: Secondary | ICD-10-CM

## 2013-07-18 LAB — LIPID PANEL
CHOLESTEROL: 144 mg/dL (ref 0–200)
HDL: 48.5 mg/dL (ref >39.00)
LDL CALC: 77 mg/dL (ref 0–99)
TRIGLYCERIDES: 95 mg/dL (ref 0.0–149.0)
Total CHOL/HDL Ratio: 3
VLDL: 19 mg/dL (ref 0.0–40.0)

## 2013-07-18 LAB — HEPATIC FUNCTION PANEL
ALBUMIN: 3.8 g/dL (ref 3.5–5.2)
ALK PHOS: 78 U/L (ref 39–117)
ALT: 15 U/L (ref 0–35)
AST: 16 U/L (ref 0–37)
Bilirubin, Direct: 0.1 mg/dL (ref 0.0–0.3)
TOTAL PROTEIN: 6.7 g/dL (ref 6.0–8.3)
Total Bilirubin: 0.5 mg/dL (ref 0.3–1.2)

## 2013-07-18 LAB — BASIC METABOLIC PANEL
BUN: 16 mg/dL (ref 6–23)
CALCIUM: 9.3 mg/dL (ref 8.4–10.5)
CO2: 27 mEq/L (ref 19–32)
CREATININE: 0.8 mg/dL (ref 0.4–1.2)
Chloride: 103 mEq/L (ref 96–112)
GFR: 76.48 mL/min (ref >60.00)
Glucose, Bld: 90 mg/dL (ref 70–99)
Potassium: 4.1 mEq/L (ref 3.5–5.1)
Sodium: 139 mEq/L (ref 135–145)

## 2013-07-18 NOTE — Assessment & Plan Note (Signed)
The patient is under less stress.  Her husband is improving.  His neurologist is started to taper him off his seizure medicine now.

## 2013-07-18 NOTE — Assessment & Plan Note (Signed)
Blood pressure has been staying stable on current therapy.  He has not been having any symptoms of congestive heart failure or palpitations.

## 2013-07-18 NOTE — Assessment & Plan Note (Signed)
Patient has a history of hypercholesterolemia.  She is on Lipitor generic 40 mg one half tablet daily.  We're checking lab work today.  She has not been having any myalgias from the statin therapy.

## 2013-07-18 NOTE — Assessment & Plan Note (Signed)
The patient has not been having any TIA or stroke symptoms.

## 2013-07-18 NOTE — Progress Notes (Signed)
Denton Brick Date of Birth:  1941/02/14 [address] [place], [place]  [address] [phone]         Fax   [phone]  History of Present Illness: This pleasant 73 year old woman is seen for a scheduled followup office visit. She has a history of essential hypertension and a history of hypercholesterolemia. Cardiac catheterization in 1994 showed mild three-vessel coronary atherosclerosis without obstructing lesions. She had a normal adenosine Cardiolite stress test in 2009. On 04/12/2012 the patient developed some tingling and clumsiness of her right hand. She was taken to [facility]. She underwent a TEE on 04/12/12 which showed no intracardiac clot or mass but she did have severe aortic plaque. She was discharged and then returned on 04/24/12 for a left carotid endarterectomy and background patch angioplasty by Dr. Arbie Cookey.  Since last visit the patient has been under increased stress.  Her husband had an intracerebral hemorrhage.  Spent a long time in rehabilitation but is now home and is doing better.   Current Outpatient Prescriptions  Medication Sig Dispense Refill  . alendronate (FOSAMAX) 70 MG tablet Take 1 tablet (70 mg total) by mouth every 7 (seven) days. Take with a full glass of water on an empty stomach.  12 tablet  3  . amLODipine (NORVASC) 10 MG tablet Take 1 tablet (10 mg total) by mouth daily.  90 tablet  3  . amLODipine (NORVASC) 10 MG tablet Take 1 tablet (10 mg total) by mouth daily.  30 tablet  1  . aspirin 81 MG tablet Take 81 mg by mouth daily.      Marland Kitchen atorvastatin (LIPITOR) 40 MG tablet Take 0.5 tablets (20 mg total) by mouth daily.  30 tablet  2  . diazepam (VALIUM) 5 MG tablet Take 1 tablet (5 mg total) by mouth daily as needed. For anxiety  30 tablet  3  . fluconazole (DIFLUCAN) 150 MG tablet Take 1 tablet (150 mg total) by mouth once.  1 tablet  0  . gabapentin (NEURONTIN) 300 MG capsule 3 tablet daily  270 capsule  3  .  HYDROcodone-acetaminophen (NORCO/VICODIN) 5-325 MG per tablet TAKE 1 TABLET BY MOUTH EVERY 6 HOURS AS NEEDED FOR PAIN  60 tablet  0  . metoprolol (LOPRESSOR) 50 MG tablet TAKE 1/2 TABLET TWICE A DAY  30 tablet  5  . nitroGLYCERIN (NITROSTAT) 0.4 MG SL tablet Place 0.4 mg under the tongue every 5 (five) minutes as needed.        Marland Kitchen omeprazole (PRILOSEC OTC) 20 MG tablet Take 20 mg by mouth daily.      . sertraline (ZOLOFT) 25 MG tablet TAKE 1 TABLET (25 MG TOTAL) BY MOUTH DAILY.  30 tablet  3  . valsartan (DIOVAN) 320 MG tablet TAKE 1 TABLET BY MOUTH EVERY DAY  30 tablet  4   No current facility-administered medications for this visit.    Allergies  Allergen Reactions  . Percocet [Oxycodone-Acetaminophen] Rash  . Hydrochlorothiazide     Pt does not remember  . Indomethacin     Pt does not remember  . Paroxetine Hcl     Pt does not remember  . Penicillins     Pt does not remember  . Pravachol     Pt does not remember  . Quinine Derivatives     Pt does not remember  . Wellbutrin [Bupropion]     agitation  . Zocor [Simvastatin - High Dose] Other (See Comments)  Weakness/no energy    Patient Active Problem List   Diagnosis Date Noted  . Coronary artery disease     Priority: High  . Arthritis 08/05/2010    Priority: Medium  . Tobacco abuse 08/05/2010    Priority: Medium  . Hypertension     Priority: Medium  . Routine general medical examination at a health care facility 01/22/2013  . Caregiver stress 01/22/2013  . Vaginitis and vulvovaginitis 01/22/2013  . 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 Chronic back pain 08/29/2012  . Occlusion and stenosis of carotid artery without mention of cerebral infarction 05/09/2012  . Carotid stenosis, left 04/13/2012  . 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 CVA (cerebral infarction) 04/11/2012  . Right sided weakness 04/10/2012  . Mitral valve regurgitation   . Hypercholesteremia     History  Smoking status    . Current Every Day Smoker -- 0.50 packs/day for 60 years  . Types: Cigarettes  Smokeless tobacco  . Never Used    History  Alcohol Use No    Family History  Problem Relation Age of Onset  . Heart attack Father   . Heart disease Father     before age 75    Review of Systems: Constitutional: no fever chills diaphoresis or fatigue or change in weight.  Head and neck: no hearing loss, no epistaxis, no photophobia or visual disturbance. Respiratory: No cough, shortness of breath or wheezing. Cardiovascular: No chest pain peripheral edema, palpitations. Gastrointestinal: No abdominal distention, no abdominal pain, no change in bowel habits hematochezia or melena. Genitourinary: No dysuria, no frequency, no urgency, no nocturia. Musculoskeletal:No arthralgias, no back pain, no gait disturbance or myalgias. Neurological: No dizziness, no headaches, no numbness, no seizures, no syncope, no weakness, no tremors. Hematologic: No lymphadenopathy, no easy bruising. Psychiatric: No confusion, no hallucinations, no sleep disturbance.    Physical Exam: Filed Vitals:   07/18/13 1045  BP: 124/67  Pulse: 73   the general appearance reveals a well-developed well-nourished woman in no distress.The head and neck exam reveals pupils equal and reactive.  Extraocular movements are full.  There is no scleral icterus.  The mouth and pharynx are normal.  The neck is supple.  The carotids reveal no bruits.  The jugular venous pressure is normal.  The  thyroid is not enlarged.  There is no lymphadenopathy.  The chest is clear to percussion and auscultation.  There are no rales or rhonchi.  Expansion of the chest is symmetrical.  The precordium is quiet.  The first heart sound is normal.  The second heart sound is physiologically split.  There is no murmur gallop rub or click.  There is no abnormal lift or heave.  The abdomen is soft and nontender.  The bowel sounds are normal.  The liver and spleen are not  enlarged.  There are no abdominal masses.  There are no abdominal bruits.  Extremities reveal good pedal pulses.  There is no phlebitis or edema.  There is no cyanosis or clubbing.  Strength is normal and symmetrical in all extremities.  There is no lateralizing weakness.  There are no sensory deficits.  The skin is warm and dry.  There is no rash.     Assessment / Plan: Continue on same medication.  Recheck in 4 months for office visit lipid panel hepatic function panel and basal metabolic panel.

## 2013-07-18 NOTE — Patient Instructions (Signed)
Your physician recommends that you continue on your current medications as directed. Please refer to the Current Medication list given to you today.  Your physician wants you to follow-up in: 4 months with fasting labs (lp/bmet/hfp) You will receive a reminder letter in the mail two months in advance. If you don't receive a letter, please call our office to schedule the follow-up appointment.  

## 2013-07-23 ENCOUNTER — Telehealth: Payer: Self-pay | Admitting: Family Medicine

## 2013-07-23 MED ORDER — HYDROCODONE-ACETAMINOPHEN 5-325 MG PO TABS: ORAL_TABLET | ORAL | Status: DC

## 2013-07-23 NOTE — Telephone Encounter (Signed)
07/23/13  Pt is needing a refill on rx HYDROcodone-acetaminophen (NORCO/VICODIN) 5-325 MG.

## 2013-07-23 NOTE — Telephone Encounter (Signed)
Med filled and pt notified.  

## 2013-07-23 NOTE — Telephone Encounter (Signed)
Last OV: 02/21/13 Last Filled: 05/23/13; #60, 0 refills Contract on file UDS: 11/30/12  Please advise.

## 2013-07-23 NOTE — Addendum Note (Signed)
Addended by: Jackson LatinoYLER, JESSICA L on: 07/23/2013 11:48 AM   Modules accepted: Orders

## 2013-07-23 NOTE — Telephone Encounter (Signed)
Ok for #60 

## 2013-07-26 ENCOUNTER — Ambulatory Visit (INDEPENDENT_AMBULATORY_CARE_PROVIDER_SITE_OTHER): Payer: Medicare Other | Admitting: Family Medicine

## 2013-07-26 ENCOUNTER — Encounter: Payer: Self-pay | Admitting: Family Medicine

## 2013-07-26 VITALS — BP 140/72 | HR 70 | Temp 98.4°F | Resp 16 | Wt 152.4 lb

## 2013-07-26 DIAGNOSIS — J0100 Acute maxillary sinusitis, unspecified: Secondary | ICD-10-CM | POA: Insufficient documentation

## 2013-07-26 DIAGNOSIS — Z636 Dependent relative needing care at home: Secondary | ICD-10-CM

## 2013-07-26 DIAGNOSIS — Z6379 Other stressful life events affecting family and household: Secondary | ICD-10-CM

## 2013-07-26 MED ORDER — SULFAMETHOXAZOLE-TMP DS 800-160 MG PO TABS: 1.0000 | ORAL_TABLET | Freq: Two times a day (BID) | ORAL | Status: DC

## 2013-07-26 NOTE — Assessment & Plan Note (Signed)
New.  Pt's sxs and PE consistent w/ infxn.  Start abx.  Reviewed supportive care and red flags that should prompt return.  Pt expressed understanding and is in agreement w/ plan.  

## 2013-07-26 NOTE — Patient Instructions (Signed)
Follow up as needed Start the Bactrim twice daily- w/ food- for the sinuses Drink plenty of fluids Try and have a conversation about what you need and why- try and give him the benefit of the doubt and give him time to learn how to do it! Call with any questions or concerns Hang in there!!!

## 2013-07-26 NOTE — Progress Notes (Signed)
   Subjective:    Patient ID: Sara [name], female    DOB: 12/08/1940, 73 y.o.   MRN: [id number]  HPI Caregiver stress- ongoing problem since husband's stroke and her role of caregiver.  Currently on Zoloft 25mg  and Valium as needed.  Still feeling considerably stressed.  Not sleeping well.  Pt is frustrated w/ husband's lack of motivation to do anything despite his feeling better.  URI- + HA, facial pressure, ear fullness, eye pain.  sxs started 4-5 days ago.  No fevers.  No N/V/D.  No known sick contacts.  Review of Systems For ROS see HPI s    Objective:   Physical Exam  Vitals reviewed. Constitutional: She appears well-developed and well-nourished. No distress.  HENT:  Head: Normocephalic and atraumatic.  Right Ear: Tympanic membrane normal.  Left Ear: Tympanic membrane normal.  Nose: Mucosal edema and rhinorrhea present. Right sinus exhibits maxillary sinus tenderness and frontal sinus tenderness. Left sinus exhibits maxillary sinus tenderness and frontal sinus tenderness.  Mouth/Throat: Uvula is midline and mucous membranes are normal. Posterior oropharyngeal erythema present. No oropharyngeal exudate.  Eyes: Conjunctivae and EOM are normal. Pupils are equal, round, and reactive to light.  Neck: Normal range of motion. Neck supple.  Cardiovascular: Normal rate, regular rhythm and normal heart sounds.   Pulmonary/Chest: Effort normal and breath sounds normal. No respiratory distress. She has no wheezes.  Lymphadenopathy:    She has no cervical adenopathy.          Assessment & Plan:

## 2013-07-26 NOTE — Progress Notes (Signed)
Pre visit review using our clinic review tool, if applicable. No additional management support is needed unless otherwise documented below in the visit note. 

## 2013-07-26 NOTE — Assessment & Plan Note (Signed)
Unchanged.  Pt frustrated w/ husband's lack of help around the house even though he has now recovered from his stroke.  Stressed that she needs to talk w/ him about this using 'I' statements- 'I'm overwhelmed.  I need help'- and provide him w/ a list of specific things to do.  Pt appreciative of advice and willing to try.

## 2013-07-30 ENCOUNTER — Telehealth: Payer: Self-pay | Admitting: Family Medicine

## 2013-07-30 NOTE — Telephone Encounter (Signed)
Relevant patient education mailed to patient.  

## 2013-08-10 ENCOUNTER — Telehealth: Payer: Self-pay | Admitting: *Deleted

## 2013-08-10 NOTE — Telephone Encounter (Signed)
Copy of 07/18/13 labs given to patient, no changes

## 2013-08-13 ENCOUNTER — Encounter: Payer: Self-pay | Admitting: Internal Medicine

## 2013-08-13 ENCOUNTER — Ambulatory Visit (INDEPENDENT_AMBULATORY_CARE_PROVIDER_SITE_OTHER): Payer: Medicare Other | Admitting: Internal Medicine

## 2013-08-13 VITALS — BP 101/62 | HR 67 | Temp 98.0°F | Wt 152.0 lb

## 2013-08-13 DIAGNOSIS — G8929 Other chronic pain: Secondary | ICD-10-CM

## 2013-08-13 DIAGNOSIS — M549 Dorsalgia, unspecified: Secondary | ICD-10-CM

## 2013-08-13 MED ORDER — PREDNISONE 10 MG PO TABS: ORAL_TABLET | ORAL | Status: DC

## 2013-08-13 NOTE — Progress Notes (Signed)
Pre visit review using our clinic review tool, if applicable. No additional management support is needed unless otherwise documented below in the visit note. 

## 2013-08-13 NOTE — Assessment & Plan Note (Addendum)
Long history of back pain, gradually getting worse, history of 4 back surgeries last was about 10 years ago. In the past she was dx w/  spinal stenosis   Plan: Prednisone for temporary relief She takes Neurontin 900 mg at night for neuropathy but will increase the dose of Neurontin trying  to help the chronic pain. Referral for pain management, has seen Dr Juliene PinaGeoffrey before, likes to seek a different opinion ( try Dr Jordan LikesSpivey)

## 2013-08-13 NOTE — Patient Instructions (Signed)
Prednisone for a few days as prescribed Increase Neurontin to 1 tablet in the morning, one tablet at midday and 2 tablets at bedtime Come back to see your primary in one month Call if any side effects.

## 2013-08-13 NOTE — Progress Notes (Signed)
Subjective:    Patient ID: Sara BrickRebecca S [name], female    DOB: 12/31/1940, 73 y.o.   MRN: [id number]  DOS:  08/13/2013 Type of  visit: Acute visit, Long history of back pain, history of 4 previous back surgeries, symptoms are gradually getting worse particularly in the last 2 weeks. Pain is worse with certain movements, it radiates from the lower back to the buttocks and anterior leg, no   symptoms distal from the knee. She takes gabapentin for neuropathy   ROS Fever or chills No recent injury No rash, no bladder or bowel incontinence. No claudication  Past Medical History  Diagnosis Date  . Mitral valve regurgitation     trace  . Hypercholesteremia   . Arthritis   . H/O hiatal hernia   . Stroke   . Coronary artery disease     sees Dr. Patty SermonsBrackbill  . Hypertension     all managed by Dr. Haynes DageBrack bill  . Anxiety   . Bronchitis     hx of  . GERD (gastroesophageal reflux disease)     hx of  . Carotid artery occlusion     Past Surgical History  Procedure Laterality Date  . Cardiac catheterization  1994  . Hysterotomy    . Carpal tunnel release      left  . Hand surgery      bilateral  . Cholecystectomy    . Tee without cardioversion  04/12/2012    Procedure: TRANSESOPHAGEAL ECHOCARDIOGRAM (TEE);  Surgeon: Lewayne BuntingBrian S Crenshaw, MD;  [facility];  Service: Cardiovascular;  Laterality: N/A;  . Back surgery      x 3 Dr Juliene PinaGeoffrey, [facility], rods, fusion, four surgery  . Endarterectomy  04/24/2012    Procedure: ENDARTERECTOMY CAROTID;  Surgeon: Larina Earthlyodd F Early, MD;  [facility];  Service: Vascular;  Laterality: Left;  . Carotid endarterectomy      History   Social History  . Marital Status: Married    Spouse Name: N/A    Number of Children: N/A  . Years of Education: N/A   Occupational History  . Not on file.   Social History Main Topics  . Smoking status: Current Every Day Smoker -- 0.50 packs/day for 60 years    Types: Cigarettes  . Smokeless  tobacco: Never Used  . Alcohol Use: No  . Drug Use: No  . Sexual Activity: Not on file   Other Topics Concern  . Not on file   Social History Narrative  . No narrative on file        Medication List       This list is accurate as of: 08/13/13  5:10 PM.  Always use your most recent med list.               alendronate 70 MG tablet  Commonly known as:  FOSAMAX  Take 1 tablet (70 mg total) by mouth every 7 (seven) days. Take with a full glass of water on an empty stomach.     amLODipine 10 MG tablet  Commonly known as:  NORVASC  Take 1 tablet (10 mg total) by mouth daily.     aspirin 81 MG tablet  Take 81 mg by mouth daily.     atorvastatin 40 MG tablet  Commonly known as:  LIPITOR  Take 0.5 tablets (20 mg total) by mouth daily.     diazepam 5 MG tablet  Commonly known as:  VALIUM  Take 1 tablet (5  mg total) by mouth daily as needed. For anxiety     gabapentin 300 MG capsule  Commonly known as:  NEURONTIN  1 tab in the morning, 1 tab at mid-day and 3 tablets at bedtime     HYDROcodone-acetaminophen 5-325 MG per tablet  Commonly known as:  NORCO/VICODIN  TAKE 1 TABLET BY MOUTH EVERY 6 HOURS AS NEEDED FOR PAIN     metoprolol 50 MG tablet  Commonly known as:  LOPRESSOR  TAKE 1/2 TABLET TWICE A DAY     nitroGLYCERIN 0.4 MG SL tablet  Commonly known as:  NITROSTAT  Place 0.4 mg under the tongue every 5 (five) minutes as needed.     omeprazole 20 MG tablet  Commonly known as:  PRILOSEC OTC  Take 20 mg by mouth daily.     predniSONE 10 MG tablet  Commonly known as:  DELTASONE  4 tablets x 2 days, 3 tabs x 2 days, 2 tabs x 2 days, 1 tab x 2 days     sertraline 25 MG tablet  Commonly known as:  ZOLOFT  TAKE 1 TABLET (25 MG TOTAL) BY MOUTH DAILY.     valsartan 320 MG tablet  Commonly known as:  DIOVAN  TAKE 1 TABLET BY MOUTH EVERY DAY           Objective:   Physical Exam BP 101/62  Pulse 67  Temp(Src) 98 F (36.7 C)  Wt 152 lb (68.947 kg)  SpO2  97%  General -- alert, well-developed, NAD.  Extremities-- no pretibial edema bilaterally  Neurologic--  alert & oriented X3. Speech normal, gait appropriate for age, strength normal in all extremities.  DTRs symmetric  Back-- no  TTP Psych-- Cognition and judgment appear intact. Cooperative with normal attention span and concentration. No anxious or depressed appearing.      Assessment & Plan:

## 2013-08-14 ENCOUNTER — Telehealth: Payer: Self-pay | Admitting: Family Medicine

## 2013-08-14 ENCOUNTER — Telehealth: Payer: Self-pay | Admitting: *Deleted

## 2013-08-14 NOTE — Telephone Encounter (Signed)
Relevant patient education mailed to patient.  

## 2013-08-14 NOTE — Telephone Encounter (Signed)
Spoke with patient who states that soon after she took the first dose of prednisone she developed a severe HA and does not want to take any more of it. Patient stated that the increase in the neurontin dose has helped her pain somewhat. I advised to continue with new neurontin dosing and to follow up with the pain mgmt specialist.

## 2013-08-16 ENCOUNTER — Other Ambulatory Visit: Payer: Self-pay | Admitting: Cardiology

## 2013-09-12 ENCOUNTER — Ambulatory Visit (INDEPENDENT_AMBULATORY_CARE_PROVIDER_SITE_OTHER): Payer: Medicare Other | Admitting: Family Medicine

## 2013-09-12 ENCOUNTER — Encounter: Payer: Self-pay | Admitting: Family Medicine

## 2013-09-12 VITALS — BP 120/78 | HR 71 | Temp 98.0°F | Resp 16 | Wt 150.1 lb

## 2013-09-12 DIAGNOSIS — M549 Dorsalgia, unspecified: Secondary | ICD-10-CM

## 2013-09-12 DIAGNOSIS — G8929 Other chronic pain: Secondary | ICD-10-CM

## 2013-09-12 MED ORDER — TIZANIDINE HCL 2 MG PO TABS: 2.0000 mg | ORAL_TABLET | Freq: Three times a day (TID) | ORAL | Status: DC | PRN

## 2013-09-12 NOTE — Assessment & Plan Note (Signed)
Pt is now following w/ [facility] and they refilled pt's Hydrocodone.  Will start Zanaflex for spasm.  Reviewed supportive care and red flags that should prompt return.  Pt expressed understanding and is in agreement w/ plan.

## 2013-09-12 NOTE — Progress Notes (Signed)
Pre visit review using our clinic review tool, if applicable. No additional management support is needed unless otherwise documented below in the visit note. 

## 2013-09-12 NOTE — Patient Instructions (Signed)
Schedule your complete physical for September Take the Tizanidine as needed for spasm Have [facility] send me their notes so I can follow along Call with any questions or concerns Happy Memorial Day!!!

## 2013-09-12 NOTE — Progress Notes (Signed)
   Subjective:    Patient ID: Denton BrickRebecca S Yamin, female    DOB: 10/04/1940, 73 y.o.   MRN: [id number]  HPI Back pain- pt w/ chronic radicular low back pain.  Has seen [facility]- is happy w/ provider.  Has MRI upcoming and EMG scheduled for tomorrow.  Pt has hydrocodone for pain.  Reports when back is very flared, will have spasm.     Review of Systems For ROS see HPI     Objective:   Physical Exam  Vitals reviewed. Constitutional: She is oriented to person, place, and time. She appears well-developed and well-nourished. No distress.  Musculoskeletal: She exhibits no edema.  Neurological: She is alert and oriented to person, place, and time. No cranial nerve deficit. Coordination normal.  Skin: Skin is warm and dry.  Psychiatric: She has a normal mood and affect. Her behavior is normal. Thought content normal.          Assessment & Plan:

## 2013-09-20 ENCOUNTER — Other Ambulatory Visit: Payer: Self-pay | Admitting: Family Medicine

## 2013-09-21 NOTE — Telephone Encounter (Signed)
Med filled.  

## 2013-09-24 ENCOUNTER — Ambulatory Visit
Admission: RE | Admit: 2013-09-24 | Discharge: 2013-09-24 | Disposition: A | Discharge status: Home / Self Care | Payer: Medicare Other | Source: Ambulatory Visit | Attending: Pain Medicine | Admitting: Pain Medicine

## 2013-09-24 ENCOUNTER — Other Ambulatory Visit: Payer: Self-pay | Admitting: Pain Medicine

## 2013-09-24 DIAGNOSIS — M545 Low back pain, unspecified: Secondary | ICD-10-CM

## 2013-10-08 ENCOUNTER — Telehealth: Payer: Self-pay | Admitting: *Deleted

## 2013-10-08 ENCOUNTER — Other Ambulatory Visit: Payer: Self-pay | Admitting: *Deleted

## 2013-10-08 DIAGNOSIS — F419 Anxiety disorder, unspecified: Secondary | ICD-10-CM

## 2013-10-08 MED ORDER — DIAZEPAM 5 MG PO TABS: 5.0000 mg | ORAL_TABLET | Freq: Every day | ORAL | Status: DC | PRN

## 2013-10-08 NOTE — Telephone Encounter (Signed)
Dr Patty SermonsBrackbill, it is ok to refill diazepam for patient? Please advise. Thanks, MI

## 2013-10-08 NOTE — Telephone Encounter (Signed)
Rx called in to pharmacy. 

## 2013-10-08 NOTE — Telephone Encounter (Signed)
Yes okay to refill the diazepam.

## 2013-11-16 ENCOUNTER — Encounter: Payer: Self-pay | Admitting: Cardiology

## 2013-11-16 ENCOUNTER — Other Ambulatory Visit: Payer: Medicare Other

## 2013-11-16 ENCOUNTER — Ambulatory Visit (INDEPENDENT_AMBULATORY_CARE_PROVIDER_SITE_OTHER): Payer: Medicare Other | Admitting: Cardiology

## 2013-11-16 VITALS — BP 138/76 | HR 66 | Ht 65.0 in | Wt 145.0 lb

## 2013-11-16 DIAGNOSIS — I2583 Coronary atherosclerosis due to lipid rich plaque: Secondary | ICD-10-CM

## 2013-11-16 DIAGNOSIS — I10 Essential (primary) hypertension: Secondary | ICD-10-CM

## 2013-11-16 DIAGNOSIS — I259 Chronic ischemic heart disease, unspecified: Secondary | ICD-10-CM

## 2013-11-16 DIAGNOSIS — I119 Hypertensive heart disease without heart failure: Secondary | ICD-10-CM

## 2013-11-16 DIAGNOSIS — I2510 Atherosclerotic heart disease of native coronary artery without angina pectoris: Secondary | ICD-10-CM

## 2013-11-16 DIAGNOSIS — I6529 Occlusion and stenosis of unspecified carotid artery: Secondary | ICD-10-CM

## 2013-11-16 DIAGNOSIS — E7800 Pure hypercholesterolemia, unspecified: Secondary | ICD-10-CM

## 2013-11-16 LAB — HEPATIC FUNCTION PANEL
ALT: 11 U/L (ref 0–35)
AST: 16 U/L (ref 0–37)
Albumin: 3.7 g/dL (ref 3.5–5.2)
Alkaline Phosphatase: 69 U/L (ref 39–117)
BILIRUBIN TOTAL: 0.7 mg/dL (ref 0.2–1.2)
Bilirubin, Direct: 0 mg/dL (ref 0.0–0.3)
Total Protein: 6.7 g/dL (ref 6.0–8.3)

## 2013-11-16 LAB — LIPID PANEL
Cholesterol: 159 mg/dL (ref 0–200)
HDL: 43 mg/dL (ref >39.00)
NonHDL: 116
TRIGLYCERIDES: 204 mg/dL — AB (ref 0.0–149.0)
Total CHOL/HDL Ratio: 4
VLDL: 40.8 mg/dL — ABNORMAL HIGH (ref 0.0–40.0)

## 2013-11-16 LAB — BASIC METABOLIC PANEL
BUN: 17 mg/dL (ref 6–23)
CALCIUM: 9.3 mg/dL (ref 8.4–10.5)
CO2: 29 mEq/L (ref 19–32)
Chloride: 106 mEq/L (ref 96–112)
Creatinine, Ser: 0.9 mg/dL (ref 0.4–1.2)
GFR: 68.27 mL/min (ref >60.00)
GLUCOSE: 101 mg/dL — AB (ref 70–99)
Potassium: 4.2 mEq/L (ref 3.5–5.1)
SODIUM: 140 meq/L (ref 135–145)

## 2013-11-16 LAB — LDL CHOLESTEROL, DIRECT: Direct LDL: 88.5 mg/dL

## 2013-11-16 NOTE — Assessment & Plan Note (Signed)
We're checking fasting lab work today.  The patient is not having any side effects from her atorvastatin

## 2013-11-16 NOTE — Progress Notes (Signed)
Sara [name] Alicia Date of Birth:  07/25/1940 [facility] [address] [place], [place]  [address] [phone]        Fax   [phone]   History of Present Illness: This pleasant 73 year old woman is seen for a scheduled followup office visit. She has a history of essential hypertension and a history of hypercholesterolemia. Cardiac catheterization in 1994 showed mild three-vessel coronary atherosclerosis without obstructing lesions. She had a normal adenosine Cardiolite stress test in 2009. On 04/12/2012 the patient developed some tingling and clumsiness of her right hand. [facility]. She underwent a TEE on 04/12/12 which showed no intracardiac clot or mass but she did have severe aortic plaque. She was discharged and then returned on 04/24/12 for a left carotid endarterectomy and background patch angioplasty by Dr. Arbie CookeyEarly.  Since last visit the patient has been under increased stress. Her husband had an intracerebral hemorrhage. Spent a long time in rehabilitation but is now home and is doing better. The patient is having more problem with back and leg pain and is now going to a pain management clinic  Current Outpatient Prescriptions  Medication Sig Dispense Refill  . alendronate (FOSAMAX) 70 MG tablet Take 1 tablet (70 mg total) by mouth every 7 (seven) days. Take with a full glass of water on an empty stomach.  12 tablet  3  . amLODipine (NORVASC) 10 MG tablet TAKE 1 TABLET (10 MG TOTAL) BY MOUTH DAILY.  30 tablet  1  . aspirin 81 MG tablet Take 81 mg by mouth daily.      Marland Kitchen. atorvastatin (LIPITOR) 40 MG tablet Take 0.5 tablets (20 mg total) by mouth daily.  30 tablet  2  . diazepam (VALIUM) 5 MG tablet Take 1 tablet (5 mg total) by mouth daily as needed. For anxiety  30 tablet  1  . gabapentin (NEURONTIN) 300 MG capsule 1 tab in the morning, 1 tab at mid-day and 3 tablets at bedtime      . HYDROcodone-acetaminophen (NORCO/VICODIN) 5-325 MG  per tablet TAKE 1 TABLET BY MOUTH EVERY 6 HOURS AS NEEDED FOR PAIN  60 tablet  0  . metoprolol (LOPRESSOR) 50 MG tablet TAKE 1/2 TABLET TWICE A DAY  30 tablet  5  . nitroGLYCERIN (NITROSTAT) 0.4 MG SL tablet Place 0.4 mg under the tongue every 5 (five) minutes as needed.        Marland Kitchen. omeprazole (PRILOSEC OTC) 20 MG tablet Take 20 mg by mouth daily.      . sertraline (ZOLOFT) 25 MG tablet TAKE 1 TABLET (25 MG TOTAL) BY MOUTH DAILY.  30 tablet  3  . tiZANidine (ZANAFLEX) 2 MG tablet Take 1 tablet (2 mg total) by mouth every 8 (eight) hours as needed for muscle spasms.  45 tablet  1  . valsartan (DIOVAN) 320 MG tablet TAKE 1 TABLET BY MOUTH EVERY DAY  30 tablet  4   No current facility-administered medications for this visit.    Allergies  Allergen Reactions  . Percocet [Oxycodone-Acetaminophen] Rash  . Hydrochlorothiazide     Pt does not remember  . Indomethacin     Pt does not remember  . Paroxetine Hcl     Pt does not remember  . Penicillins     Pt does not remember  . Pravachol     Pt does not remember  . Quinine Derivatives     Pt does not remember  . Wellbutrin [Bupropion]  agitation  . Zocor [Simvastatin - High Dose] Other (See Comments)    Weakness/no energy    Patient Active Problem List   Diagnosis Date Noted  . Coronary artery disease     Priority: High  . Arthritis 08/05/2010    Priority: Medium  . Tobacco abuse 08/05/2010    Priority: Medium  . Hypertension     Priority: Medium  . Sinusitis, acute maxillary 07/26/2013  . Routine general medical examination at a health care facility 01/22/2013  . Caregiver stress 01/22/2013  . Vaginitis and vulvovaginitis 01/22/2013  . 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 Chronic back pain 08/29/2012  . Occlusion and stenosis of carotid artery without mention of cerebral infarction 05/09/2012  . Carotid stenosis, left 04/13/2012  . 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 CVA (cerebral infarction)  04/11/2012  . Right sided weakness 04/10/2012  . Mitral valve regurgitation   . Hypercholesteremia     History  Smoking status  . Current Every Day Smoker -- 0.50 packs/day for 60 years  . Types: Cigarettes  Smokeless tobacco  . Never Used    History  Alcohol Use No    Family History  Problem Relation Age of Onset  . Heart attack Father   . Heart disease Father     before age 85    Review of Systems: Constitutional: no fever chills diaphoresis or fatigue or change in weight.  Head and neck: no hearing loss, no epistaxis, no photophobia or visual disturbance. Respiratory: No cough, shortness of breath or wheezing. Cardiovascular: No chest pain peripheral edema, palpitations. Gastrointestinal: No abdominal distention, no abdominal pain, no change in bowel habits hematochezia or melena. Genitourinary: No dysuria, no frequency, no urgency, no nocturia. Musculoskeletal:No arthralgias, no back pain, no gait disturbance or myalgias. Neurological: No dizziness, no headaches, no numbness, no seizures, no syncope, no weakness, no tremors. Hematologic: No lymphadenopathy, no easy bruising. Psychiatric: No confusion, no hallucinations, no sleep disturbance.    Physical Exam: Filed Vitals:   11/16/13 0914  BP: 138/76  Pulse: 66   the general appear reveals a well-developed well-nourished woman in no distress.The head and neck exam reveals pupils equal and reactive.  Extraocular movements are full.  There is no scleral icterus.  The mouth and pharynx are normal.  The neck is supple.  The carotids reveal no bruits.  The jugular venous pressure is normal.  The  thyroid is not enlarged.  There is no lymphadenopathy.  The chest is clear to percussion and auscultation.  There are no rales or rhonchi.  Expansion of the chest is symmetrical.  The precordium is quiet.  The first heart sound is normal.  The second heart sound is physiologically split.  There is no murmur gallop rub or click.   There is no abnormal lift or heave.  The abdomen is soft and nontender.  The bowel sounds are normal.  The liver and spleen are not enlarged.  There are no abdominal masses.  There are no abdominal bruits.  Extremities reveal good pedal pulses.  There is no phlebitis or edema.  There is no cyanosis or clubbing.  Strength is normal and symmetrical in all extremities.  There is no lateralizing weakness.  There are no sensory deficits.  The skin is warm and dry.  There is no rash.     Assessment / Plan: 1. three-vessel nonocclusive coronary artery disease by cardiac catheterization in 1994.  Normal adenosine Cardiolite stress test in 2009 2. history of TIA in  December 2013 and underwent left carotid endarterectomy by Dr. Arbie Cookey 3. chronic low back pain and leg pain, now under the care of pain management 4. Hypercholesterolemia 5. essential hypertension without heart failure  Disposition continue same medicines.  Blood work today pending.  Recheck in 4 months for office visit EKG lipid panel basal metabolic panel and hepatic function panel

## 2013-11-16 NOTE — Assessment & Plan Note (Signed)
The patient has not had any TIA symptoms. 

## 2013-11-16 NOTE — Assessment & Plan Note (Signed)
Blood pressure is stable on current therapy. 

## 2013-11-16 NOTE — Patient Instructions (Signed)
Will obtain labs today and call you with the results (lp/bmet/hfp)  Your physician recommends that you continue on your current medications as directed. Please refer to the Current Medication list given to you today.  Your physician recommends that you schedule a follow-up appointment in: 4 months with fasting labs (lp/bmet/hfp) and ekg  

## 2013-11-16 NOTE — Assessment & Plan Note (Signed)
The patient has not had recurrent chest pain or angina 

## 2013-11-17 NOTE — Progress Notes (Signed)
Quick Note:  Please report to patient. The recent labs are stable. Continue same medication and careful diet. ______ 

## 2013-11-21 ENCOUNTER — Other Ambulatory Visit: Payer: Self-pay | Admitting: Cardiology

## 2013-11-23 ENCOUNTER — Telehealth: Payer: Self-pay | Admitting: Cardiology

## 2013-11-23 NOTE — Telephone Encounter (Signed)
Rec'd from [facility] forward 2 pages to Dr. Patty SermonsBrackbill

## 2013-12-21 ENCOUNTER — Other Ambulatory Visit: Payer: Self-pay | Admitting: Cardiology

## 2014-01-17 ENCOUNTER — Other Ambulatory Visit: Payer: Self-pay | Admitting: General Practice

## 2014-01-17 MED ORDER — SERTRALINE HCL 25 MG PO TABS: ORAL_TABLET | ORAL | Status: DC

## 2014-01-19 ENCOUNTER — Other Ambulatory Visit: Payer: Self-pay | Admitting: Cardiology

## 2014-02-13 ENCOUNTER — Telehealth: Payer: Self-pay

## 2014-02-13 NOTE — Telephone Encounter (Signed)
Called pt,  Pt stated that there is going to be a procedure done to help with back pain and would call back to schedule that appointment.   RE: 2015 AWV needs

## 2014-03-18 ENCOUNTER — Other Ambulatory Visit: Payer: Self-pay | Admitting: Cardiology

## 2014-03-20 ENCOUNTER — Other Ambulatory Visit: Payer: Medicare Other

## 2014-03-20 ENCOUNTER — Ambulatory Visit: Payer: Medicare Other | Admitting: Cardiology

## 2014-04-18 ENCOUNTER — Other Ambulatory Visit: Payer: Self-pay | Admitting: Cardiology

## 2014-04-18 DIAGNOSIS — G629 Polyneuropathy, unspecified: Secondary | ICD-10-CM

## 2014-04-22 ENCOUNTER — Other Ambulatory Visit: Payer: Self-pay | Admitting: Cardiology

## 2014-05-23 ENCOUNTER — Other Ambulatory Visit (INDEPENDENT_AMBULATORY_CARE_PROVIDER_SITE_OTHER): Payer: Medicare Other | Admitting: *Deleted

## 2014-05-23 ENCOUNTER — Encounter: Payer: Self-pay | Admitting: Cardiology

## 2014-05-23 ENCOUNTER — Ambulatory Visit (INDEPENDENT_AMBULATORY_CARE_PROVIDER_SITE_OTHER): Payer: Medicare Other | Admitting: Cardiology

## 2014-05-23 VITALS — BP 138/90 | HR 67 | Ht 65.0 in | Wt 136.0 lb

## 2014-05-23 DIAGNOSIS — E7800 Pure hypercholesterolemia, unspecified: Secondary | ICD-10-CM

## 2014-05-23 DIAGNOSIS — I119 Hypertensive heart disease without heart failure: Secondary | ICD-10-CM

## 2014-05-23 DIAGNOSIS — I10 Essential (primary) hypertension: Secondary | ICD-10-CM

## 2014-05-23 DIAGNOSIS — G5793 Unspecified mononeuropathy of bilateral lower limbs: Secondary | ICD-10-CM

## 2014-05-23 DIAGNOSIS — I259 Chronic ischemic heart disease, unspecified: Secondary | ICD-10-CM

## 2014-05-23 DIAGNOSIS — G5791 Unspecified mononeuropathy of right lower limb: Secondary | ICD-10-CM

## 2014-05-23 DIAGNOSIS — G5792 Unspecified mononeuropathy of left lower limb: Secondary | ICD-10-CM

## 2014-05-23 LAB — BASIC METABOLIC PANEL
BUN: 14 mg/dL (ref 6–23)
CO2: 26 mEq/L (ref 19–32)
CREATININE: 0.75 mg/dL (ref 0.40–1.20)
Calcium: 9.1 mg/dL (ref 8.4–10.5)
Chloride: 107 mEq/L (ref 96–112)
GFR: 79.84 mL/min (ref >60.00)
Glucose, Bld: 108 mg/dL — ABNORMAL HIGH (ref 70–99)
POTASSIUM: 3.9 meq/L (ref 3.5–5.1)
Sodium: 142 mEq/L (ref 135–145)

## 2014-05-23 LAB — LIPID PANEL
CHOL/HDL RATIO: 4
Cholesterol: 147 mg/dL (ref 0–200)
HDL: 33.9 mg/dL — AB (ref >39.00)
NonHDL: 113.1
Triglycerides: 309 mg/dL — ABNORMAL HIGH (ref 0.0–149.0)
VLDL: 61.8 mg/dL — ABNORMAL HIGH (ref 0.0–40.0)

## 2014-05-23 LAB — HEPATIC FUNCTION PANEL
ALK PHOS: 105 U/L (ref 39–117)
ALT: 13 U/L (ref 0–35)
AST: 15 U/L (ref 0–37)
Albumin: 3.7 g/dL (ref 3.5–5.2)
Bilirubin, Direct: 0.1 mg/dL (ref 0.0–0.3)
TOTAL PROTEIN: 6.7 g/dL (ref 6.0–8.3)
Total Bilirubin: 0.5 mg/dL (ref 0.2–1.2)

## 2014-05-23 LAB — LDL CHOLESTEROL, DIRECT: LDL DIRECT: 70 mg/dL

## 2014-05-23 NOTE — Patient Instructions (Signed)
INCREASE YOU GABAPENTIN TO 5 A DAY (1 IN THE AM, 1 AT LUNCH, AND 3 AT BEDTIME)  Your physician recommends that you schedule a follow-up appointment in: 4 months with fasting labs (lp/bmet/hfp)

## 2014-05-23 NOTE — Progress Notes (Signed)
Cardiology Office Note   Date:  05/23/2014   ID:  [name], [name] 1941-03-13, MRN [id number]  PCP:  Neena Rhymes, MD  Cardiologist:   Cassell Clement, MD   No chief complaint on file.     History of Present Illness: Sara [name] is a 74 y.o. female who presents for follow-up office visit  This pleasant 74 year old woman is seen for a scheduled followup office visit. She has a history of essential hypertension and a history of hypercholesterolemia. Cardiac catheterization in 1994 showed mild three-vessel coronary atherosclerosis without obstructing lesions. She had a normal adenosine Cardiolite stress test in 2009. On 04/12/2012 the patient developed some tingling and clumsiness of her right hand. She was taken to [facility]. She underwent a TEE on 04/12/12 which showed no intracardiac clot or mass but she did have severe aortic plaque. She was discharged and then returned on 04/24/12 for a left carotid endarterectomy and background patch angioplasty by Dr. Arbie Cookey.  Since last visit the patient has been under increased stress. Her husband had an intracerebral hemorrhage. Spent a long time in rehabilitation but is now home and is doing better. The patient is having more problem with back and leg pain and is now going to a pain management clinic.  As we last saw her she underwent implantation of a nerve stimulator device.  It was done by the physicians at the pain center and it was done at the [place] facility.  The nerve stimulator has helped her back pain but has not really helped her leg pain.  She has been told that the leg pain may be more related to her peripheral neuropathy. [name] has not been experiencing any recent chest pain or angina.  She has had a poor appetite because of her chronic pain.  Her weight is down 9 pounds since July.  Past Medical History  Diagnosis Date  . Mitral valve regurgitation     trace  . Hypercholesteremia   . Arthritis   . H/O  hiatal hernia   . Stroke   . Coronary artery disease     sees Dr. Patty Sermons  . Hypertension     all managed by Dr. Haynes Dage bill  . Anxiety   . Bronchitis     hx of  . GERD (gastroesophageal reflux disease)     hx of  . Carotid artery occlusion     Past Surgical History  Procedure Laterality Date  . Cardiac catheterization  1994  . Hysterotomy    . Carpal tunnel release      left  . Hand surgery      bilateral  . Cholecystectomy    . Tee without cardioversion  04/12/2012    Procedure: TRANSESOPHAGEAL ECHOCARDIOGRAM (TEE);  Surgeon: Lewayne Bunting, MD;  Location: [facility] END[facility]COPY;  Service: Cardiovascular;  Laterality: N/A;  . Back surgery      x 3 Dr Juliene Pina, last @ [facility] ~ 2005, rods, fusion, four surgery  . Endarterectomy  04/24/2012    Procedure: ENDARTERECTOMY CAROTID;  Surgeon: Larina Earthly, MD;  Location: [facility] OR;  Service: Vascular;  Laterality: Left;  . Carotid endarterectomy       Current Outpatient Prescriptions  Medication Sig Dispense Refill  . amLODipine (NORVASC) 10 MG tablet TAKE 1 TABLET (10 MG TOTAL) BY MOUTH DAILY. 30 tablet 2  . aspirin 81 MG tablet Take 81 mg by mouth daily.    Marland Kitchen atorvastatin (LIPITOR) 40 MG tablet TAKE 1/2 TABLET BY MOUTH  DAILY 30 tablet 2  . diazepam (VALIUM) 5 MG tablet Take 1 tablet (5 mg total) by mouth daily as needed. For anxiety 30 tablet 1  . FLUZONE HIGH-DOSE 0.5 ML SUSY   0  . gabapentin (NEURONTIN) 300 MG capsule 1 tab in the morning, 1 tab at mid-day and 3 tablets at bedtime    . HYDROcodone-acetaminophen (NORCO) 10-325 MG per tablet Take 1 tablet by mouth every 4 (four) hours as needed.   0  . metoprolol (LOPRESSOR) 50 MG tablet TAKE 1/2 TABLET BY MOUTH TWICE A DAY 30 tablet 1  . nitroGLYCERIN (NITROSTAT) 0.4 MG SL tablet Place 0.4 mg under the tongue every 5 (five) minutes as needed.      Marland Kitchen omeprazole (PRILOSEC OTC) 20 MG tablet Take 20 mg by mouth daily.    . sertraline (ZOLOFT) 25 MG tablet TAKE 1 TABLET (25 MG TOTAL) BY  MOUTH DAILY. 30 tablet 3  . valsartan (DIOVAN) 320 MG tablet TAKE 1 TABLET BY MOUTH EVERY DAY 30 tablet 4   No current facility-administered medications for this visit.    Allergies:   Percocet; Hydrochlorothiazide; Indomethacin; Paroxetine hcl; Penicillins; Pravachol; Quinine derivatives; Wellbutrin; and Zocor    Social History:  The patient  reports that she has been smoking Cigarettes.  She has a 30 pack-year smoking history. She has never used smokeless tobacco. She reports that she does not drink alcohol or use illicit drugs.   Family History:  The patient's family history includes Heart attack in her father; Heart disease in her father.    ROS:  Please see the history of present illness.   Otherwise, review of systems are positive for none.   All other systems are reviewed and negative.    PHYSICAL EXAM: VS:  BP 138/90 mmHg  Pulse 67  Ht  (1.651 m)  Wt 136 lb (61.689 kg)  BMI 22.63 kg/m2 , BMI Body mass index is 22.63 kg/(m^2). GEN: Well nourished, well developed, in no acute distress HEENT: normal Neck: no JVD, carotid bruits, or masses Cardiac: RRR; no murmurs, rubs, or gallops,no edema  Respiratory:  clear to auscultation bilaterally, normal work of breathing GI: soft, nontender, nondistended, + BS MS: no deformity or atrophy Skin: warm and dry, no rash Neuro:  Strength and sensation are intact Psych: euthymic mood, full affect   EKG:  EKG is ordered today. The ekg ordered today demonstrates normal sinus rhythm and no ischemic changes.  There is artifact present from the nerve stimulator.   Recent Labs: 11/16/2013: ALT 11; BUN 17; Creatinine 0.9; Potassium 4.2; Sodium 140    Lipid Panel    Component Value Date/Time   CHOL 159 11/16/2013 0943   TRIG 204.0* 11/16/2013 0943   HDL 43.00 11/16/2013 0943   CHOLHDL 4 11/16/2013 0943   VLDL 40.8* 11/16/2013 0943   LDLCALC 77 07/18/2013 1123   LDLDIRECT 88.5 11/16/2013 0943      Wt Readings from Last 3  Encounters:  05/23/14 136 lb (61.689 kg)  11/16/13 145 lb (65.772 kg)  09/12/13 150 lb 2 oz (68.096 kg)      Other studies Reviewed:   ASSESSMENT AND PLAN:  1. three-vessel nonocclusive coronary artery disease by cardiac catheterization in 1994. Normal adenosine Cardiolite stress test in 2009 2. history of TIA in December 2013 and underwent left carotid endarterectomy by Dr. Arbie Cookey 3. chronic low back pain and leg pain, now under the care of pain management 4. Hypercholesterolemia 5. essential hypertension without heart failure 6.  Peripheral neuropathy   Current medicines are reviewed at length with the patient today.  The patient does not have concerns regarding medicines.  The following changes have been made:  For her peripheral neuropathy we are increasing her gabapentin up to 5 tablets a day.  She will take one in the morning, 1 at lunch, and 3 in the evening.  Labs/ tests ordered today include: Lipid panel, hepatic function panel, and basal metabolic panel   Orders Placed This Encounter  Procedures  . Lipid panel  . Hepatic function panel  . Basic metabolic panel  . EKG 12-Lead     Disposition:   FU with Dr. Patty SermonsBrackbill in 4 months office visit, lipid panel, hepatic function panel, and basal metabolic panel   Signed, Cassell Clementhomas Stevens Magwood, MD  05/23/2014 2:33 PM    [facility] [address], [place], [place]  [address] Phone: [phone]; Fax: [phone]

## 2014-05-23 NOTE — Addendum Note (Signed)
Addended by: Tonita PhoenixBOWDEN, ROBIN K on: 05/23/2014 11:29 AM   Modules accepted: Orders

## 2014-05-23 NOTE — Progress Notes (Signed)
Quick Note:  Please report to patient. The recent labs are stable. Continue same medication and careful diet. Triglycerides are higher so watch sweets and carbs. ______

## 2014-05-27 ENCOUNTER — Other Ambulatory Visit (HOSPITAL_COMMUNITY): Payer: Medicare Other

## 2014-05-27 ENCOUNTER — Ambulatory Visit: Payer: Medicare Other | Admitting: Family

## 2014-05-30 ENCOUNTER — Other Ambulatory Visit: Payer: Self-pay | Admitting: Cardiology

## 2014-05-31 ENCOUNTER — Other Ambulatory Visit: Payer: Self-pay | Admitting: General Practice

## 2014-05-31 MED ORDER — SERTRALINE HCL 25 MG PO TABS: ORAL_TABLET | ORAL | Status: DC

## 2014-05-31 NOTE — Telephone Encounter (Signed)
Last OV 09/12/13 (back pain) Sertraline last filled 01/17/14 #30 with 3

## 2014-06-27 ENCOUNTER — Other Ambulatory Visit: Payer: Self-pay | Admitting: Cardiology

## 2014-06-27 DIAGNOSIS — F419 Anxiety disorder, unspecified: Secondary | ICD-10-CM

## 2014-09-20 ENCOUNTER — Encounter: Payer: Self-pay | Admitting: Cardiology

## 2014-09-20 ENCOUNTER — Other Ambulatory Visit (INDEPENDENT_AMBULATORY_CARE_PROVIDER_SITE_OTHER): Payer: Medicare Other | Admitting: *Deleted

## 2014-09-20 ENCOUNTER — Ambulatory Visit (INDEPENDENT_AMBULATORY_CARE_PROVIDER_SITE_OTHER): Payer: Medicare Other | Admitting: Cardiology

## 2014-09-20 VITALS — BP 150/78 | HR 68 | Ht 65.0 in | Wt 131.2 lb

## 2014-09-20 DIAGNOSIS — E7800 Pure hypercholesterolemia, unspecified: Secondary | ICD-10-CM

## 2014-09-20 DIAGNOSIS — G5793 Unspecified mononeuropathy of bilateral lower limbs: Secondary | ICD-10-CM

## 2014-09-20 DIAGNOSIS — R634 Abnormal weight loss: Secondary | ICD-10-CM

## 2014-09-20 DIAGNOSIS — I259 Chronic ischemic heart disease, unspecified: Secondary | ICD-10-CM

## 2014-09-20 DIAGNOSIS — I10 Essential (primary) hypertension: Secondary | ICD-10-CM

## 2014-09-20 DIAGNOSIS — G5791 Unspecified mononeuropathy of right lower limb: Secondary | ICD-10-CM | POA: Diagnosis not present

## 2014-09-20 DIAGNOSIS — G5792 Unspecified mononeuropathy of left lower limb: Secondary | ICD-10-CM

## 2014-09-20 LAB — BASIC METABOLIC PANEL
BUN: 12 mg/dL (ref 6–23)
CALCIUM: 9.1 mg/dL (ref 8.4–10.5)
CO2: 29 meq/L (ref 19–32)
Chloride: 104 mEq/L (ref 96–112)
Creatinine, Ser: 0.64 mg/dL (ref 0.40–1.20)
GFR: 95.79 mL/min (ref >60.00)
Glucose, Bld: 95 mg/dL (ref 70–99)
POTASSIUM: 3.9 meq/L (ref 3.5–5.1)
SODIUM: 138 meq/L (ref 135–145)

## 2014-09-20 LAB — HEPATIC FUNCTION PANEL
ALT: 12 U/L (ref 0–35)
AST: 15 U/L (ref 0–37)
Albumin: 3.7 g/dL (ref 3.5–5.2)
Alkaline Phosphatase: 96 U/L (ref 39–117)
BILIRUBIN DIRECT: 0.1 mg/dL (ref 0.0–0.3)
TOTAL PROTEIN: 6.6 g/dL (ref 6.0–8.3)
Total Bilirubin: 0.5 mg/dL (ref 0.2–1.2)

## 2014-09-20 LAB — LIPID PANEL
Cholesterol: 135 mg/dL (ref 0–200)
HDL: 39.1 mg/dL (ref >39.00)
LDL Cholesterol: 57 mg/dL (ref 0–99)
NonHDL: 95.9
Total CHOL/HDL Ratio: 3
Triglycerides: 193 mg/dL — ABNORMAL HIGH (ref 0.0–149.0)
VLDL: 38.6 mg/dL (ref 0.0–40.0)

## 2014-09-20 NOTE — Patient Instructions (Addendum)
Medication Instructions:  Your physician recommends that you continue on your current medications as directed. Please refer to the Current Medication list given to you today.  Labwork: NONE  Testing/Procedures: NONE  Follow-Up: Your physician wants you to follow-up in: 4 months with fasting labs (lp/bmet/hfp/TSH/CBC)  You will receive a reminder letter in the mail two months in advance. If you don't receive a letter, please call our office to schedule the follow-up appointment.   Any Other Special Instructions Will Be Listed Below (If Applicable). WORK HARDER ON DIET AND WEIGHT LOSS

## 2014-09-20 NOTE — Progress Notes (Signed)
Cardiology Office Note   Date:  09/20/2014   ID:  Sara [name], DOB December 17, 1940, MRN [id number]  PCP:  Neena Rhymes, MD  Cardiologist: Cassell Clement MD  No chief complaint on file.     History of Present Illness: Sara [name] is a 74 y.o. female who presents for a four-month visit  This pleasant 74 year old woman is seen for a scheduled followup office visit. She has a history of essential hypertension and a history of hypercholesterolemia. Cardiac catheterization in 1994 showed mild three-vessel coronary atherosclerosis without obstructing lesions. She had a normal adenosine Cardiolite stress test in 2009. On 04/12/2012 the patient developed some tingling and clumsiness of her right hand. She was taken to [facility]. She underwent a TEE on 04/12/12 which showed no intracardiac clot or mass but she did have severe aortic plaque. She was discharged and then returned on 04/24/12 for a left carotid endarterectomy and background patch angioplasty by Dr. Arbie Cookey.   The patient is having more problem with back and leg pain and is now going to a pain management clinic. As we last saw her she underwent implantation of a nerve stimulator device. It was done by the physicians at the pain center and it was done at the [place] facility. The nerve stimulator has helped her back pain but has not really helped her leg pain. She has been told that the leg pain may be more related to her peripheral neuropathy. The patient has not been experiencing any recent chest pain or angina. She has had a poor appetite because of her chronic pain. Her weight is down 14 pounds since July 2015.  She complains of poor appetite and she attributes this to being in chronic pain from her back and her legs.  Past Medical History  Diagnosis Date  . Mitral valve regurgitation     trace  . Hypercholesteremia   . Arthritis   . H/O hiatal hernia   . Stroke   . Coronary artery disease     sees  Dr. Patty Sermons  . Hypertension     all managed by Dr. Haynes Dage bill  . Anxiety   . Bronchitis     hx of  . GERD (gastroesophageal reflux disease)     hx of  . Carotid artery occlusion     Past Surgical History  Procedure Laterality Date  . Cardiac catheterization  1994  . Hysterotomy    . Carpal tunnel release      left  . Hand surgery      bilateral  . Cholecystectomy    . Tee without cardioversion  04/12/2012    Procedure: TRANSESOPHAGEAL ECHOCARDIOGRAM (TEE);  Surgeon: Lewayne Bunting, MD;  Location: [facility] ENDOSCOPY;  Service: Cardiovascular;  Laterality: N/A;  . Back surgery      x 3 Dr Juliene Pina, last @ [facility] ~ 2005, rods, fusion, four surgery  . Endarterectomy  04/24/2012    Procedure: ENDARTERECTOMY CAROTID;  Surgeon: Larina Earthly, MD;  Location: [facility] OR;  Service: Vascular;  Laterality: Left;  . Carotid endarterectomy       Current Outpatient Prescriptions  Medication Sig Dispense Refill  . amLODipine (NORVASC) 10 MG tablet TAKE 1 TABLET (10 MG TOTAL) BY MOUTH DAILY. 30 tablet 2  . aspirin 81 MG tablet Take 81 mg by mouth daily.    Marland Kitchen atorvastatin (LIPITOR) 40 MG tablet TAKE 1/2 TABLET BY MOUTH DAILY 30 tablet 3  . diazepam (VALIUM) 5 MG tablet TAKE 1 TABLET  BY MOUTH EVERY DAY AS NEEDED FOR ANXIETY 30 tablet 5  . gabapentin (NEURONTIN) 300 MG capsule 1 tab in the morning, 1 tab at mid-day and 3 tablets at bedtime    . HYDROcodone-acetaminophen (NORCO) 10-325 MG per tablet Take 1 tablet by mouth every 4 (four) hours as needed (for pain).   0  . metoprolol (LOPRESSOR) 50 MG tablet TAKE 1/2 TABLET BY MOUTH TWICE A DAY 30 tablet 6  . nitroGLYCERIN (NITROSTAT) 0.4 MG SL tablet Place 0.4 mg under the tongue every 5 (five) minutes as needed (for chest pain).     Marland Kitchen. omeprazole (PRILOSEC OTC) 20 MG tablet Take 20 mg by mouth daily.    . sertraline (ZOLOFT) 25 MG tablet TAKE 1 TABLET (25 MG TOTAL) BY MOUTH DAILY. 30 tablet 3  . valsartan (DIOVAN) 320 MG tablet TAKE 1 TABLET BY MOUTH  EVERY DAY 30 tablet 4   No current facility-administered medications for this visit.    Allergies:   Percocet; Hydrochlorothiazide; Indomethacin; Paroxetine hcl; Penicillins; Pravachol; Quinine derivatives; Wellbutrin; and Zocor    Social History:  The patient  reports that she has been smoking Cigarettes.  She has a 30 pack-year smoking history. She has never used smokeless tobacco. She reports that she does not drink alcohol or use illicit drugs.   Family History:  The patient's family history includes Cancer in her sister; Heart attack in her father; Heart disease in her father, sister, and sister.    ROS:  Please see the history of present illness.   Otherwise, review of systems are positive for none.   All other systems are reviewed and negative.    PHYSICAL EXAM: VS:  BP 150/78 mmHg  Pulse 68  Ht 5\' 5"  (1.651 m)  Wt 131 lb 3.2 oz (59.512 kg)  BMI 21.83 kg/m2 , BMI Body mass index is 21.83 kg/(m^2). GEN: Well nourished, well developed, in no acute distress HEENT: normal Neck: no JVD, carotid bruits, or masses Cardiac: RRR; no murmurs, rubs, or gallops,no edema  Respiratory:  clear to auscultation bilaterally, normal work of breathing GI: soft, nontender, nondistended, + BS MS: no deformity or atrophy Skin: warm and dry, no rash Neuro:  Strength and sensation are intact Psych: euthymic mood, full affect   EKG:  EKG is not ordered today.    Recent Labs: 05/23/2014: ALT 13; BUN 14; Creatinine 0.75; Potassium 3.9; Sodium 142    Lipid Panel    Component Value Date/Time   CHOL 147 05/23/2014 1129   TRIG 309.0* 05/23/2014 1129   HDL 33.90* 05/23/2014 1129   CHOLHDL 4 05/23/2014 1129   VLDL 61.8* 05/23/2014 1129   LDLCALC 77 07/18/2013 1123   LDLDIRECT 70.0 05/23/2014 1129      Wt Readings from Last 3 Encounters:  09/20/14 131 lb 3.2 oz (59.512 kg)  05/23/14 136 lb (61.689 kg)  11/16/13 145 lb (65.772 kg)         ASSESSMENT AND PLAN:  1. three-vessel  nonocclusive coronary artery disease by cardiac catheterization in 1994. Normal adenosine Cardiolite stress test in 2009 2. history of TIA in December 2013 and underwent left carotid endarterectomy by Dr. Arbie CookeyEarly 3. chronic low back pain and leg pain, now under the care of pain management.  She has an implanted spinal nerve stimulator. 4. Hypercholesterolemia 5. essential hypertension without heart failure 6. Peripheral neuropathy   Current medicines are reviewed at length with the patient today. The patient does not have concerns regarding medicines.    Current  medicines are reviewed at length with the patient today.  The patient has concerns regarding medicines.  The following changes have been made:  no change  Labs/ tests ordered today include:   Orders Placed This Encounter  Procedures  . Lipid panel  . Hepatic function panel  . Basic metabolic panel  . TSH  . CBC with Differential/Platelet     Continue same medication.  Try not to lose any more weight.  Recheck in 4 months for office visit CBC lipid panel hepatic function panel basal metabolic panel and TSH  Signed, Cassell Clement MD 09/20/2014 11:36 AM    [facility] [address] [place], [place], [place]  [address] Phone: [phone]; Fax: [phone]

## 2014-09-23 NOTE — Progress Notes (Signed)
Quick Note:  Please report to patient. The recent labs are stable. Continue same medication and careful diet. ______ 

## 2014-09-25 ENCOUNTER — Telehealth: Payer: Self-pay | Admitting: General Practice

## 2014-09-25 MED ORDER — SERTRALINE HCL 25 MG PO TABS: ORAL_TABLET | ORAL | Status: DC

## 2014-09-25 NOTE — Telephone Encounter (Signed)
Ok for #30, no refills.  Needs to schedule appt

## 2014-09-25 NOTE — Telephone Encounter (Signed)
Last OV 09/12/13 (back pain) Sertraline last filled 05/31/14 #30 with 3  No upcoming appts, letter mailed to make appt.

## 2014-09-25 NOTE — Telephone Encounter (Signed)
Med filled and letter mailed to pt.  

## 2014-11-04 ENCOUNTER — Other Ambulatory Visit: Payer: Self-pay | Admitting: Cardiology

## 2015-01-04 ENCOUNTER — Other Ambulatory Visit: Payer: Self-pay | Admitting: Cardiology

## 2015-01-10 ENCOUNTER — Other Ambulatory Visit: Payer: Self-pay | Admitting: Cardiology

## 2015-01-10 DIAGNOSIS — F419 Anxiety disorder, unspecified: Secondary | ICD-10-CM

## 2015-01-20 NOTE — Telephone Encounter (Signed)
Okay to refill? 

## 2015-02-07 ENCOUNTER — Encounter (HOSPITAL_COMMUNITY): Payer: Self-pay

## 2015-02-07 ENCOUNTER — Inpatient Hospital Stay (HOSPITAL_COMMUNITY)
Admission: EM | Admit: 2015-02-07 | Discharge: 2015-02-10 | DRG: 482 | Disposition: A | Discharge status: Home Health | Payer: Medicare Other | Attending: Internal Medicine | Admitting: Internal Medicine

## 2015-02-07 ENCOUNTER — Emergency Department (HOSPITAL_COMMUNITY): Payer: Medicare Other

## 2015-02-07 DIAGNOSIS — I739 Peripheral vascular disease, unspecified: Secondary | ICD-10-CM | POA: Diagnosis present

## 2015-02-07 DIAGNOSIS — Y92003 Bedroom of unspecified non-institutional (private) residence as the place of occurrence of the external cause: Secondary | ICD-10-CM

## 2015-02-07 DIAGNOSIS — K219 Gastro-esophageal reflux disease without esophagitis: Secondary | ICD-10-CM | POA: Diagnosis not present

## 2015-02-07 DIAGNOSIS — M25552 Pain in left hip: Secondary | ICD-10-CM | POA: Diagnosis present

## 2015-02-07 DIAGNOSIS — E785 Hyperlipidemia, unspecified: Secondary | ICD-10-CM | POA: Diagnosis not present

## 2015-02-07 DIAGNOSIS — Z8249 Family history of ischemic heart disease and other diseases of the circulatory system: Secondary | ICD-10-CM

## 2015-02-07 DIAGNOSIS — I340 Nonrheumatic mitral (valve) insufficiency: Secondary | ICD-10-CM | POA: Diagnosis present

## 2015-02-07 DIAGNOSIS — Z720 Tobacco use: Secondary | ICD-10-CM | POA: Diagnosis not present

## 2015-02-07 DIAGNOSIS — Z8673 Personal history of transient ischemic attack (TIA), and cerebral infarction without residual deficits: Secondary | ICD-10-CM | POA: Diagnosis not present

## 2015-02-07 DIAGNOSIS — I2510 Atherosclerotic heart disease of native coronary artery without angina pectoris: Secondary | ICD-10-CM | POA: Diagnosis present

## 2015-02-07 DIAGNOSIS — E7800 Pure hypercholesterolemia, unspecified: Secondary | ICD-10-CM | POA: Diagnosis present

## 2015-02-07 DIAGNOSIS — I10 Essential (primary) hypertension: Secondary | ICD-10-CM | POA: Diagnosis present

## 2015-02-07 DIAGNOSIS — F17210 Nicotine dependence, cigarettes, uncomplicated: Secondary | ICD-10-CM | POA: Diagnosis not present

## 2015-02-07 DIAGNOSIS — K449 Diaphragmatic hernia without obstruction or gangrene: Secondary | ICD-10-CM | POA: Diagnosis not present

## 2015-02-07 DIAGNOSIS — W19XXXA Unspecified fall, initial encounter: Secondary | ICD-10-CM | POA: Diagnosis present

## 2015-02-07 DIAGNOSIS — M1990 Unspecified osteoarthritis, unspecified site: Secondary | ICD-10-CM | POA: Diagnosis not present

## 2015-02-07 DIAGNOSIS — Z7982 Long term (current) use of aspirin: Secondary | ICD-10-CM | POA: Diagnosis not present

## 2015-02-07 DIAGNOSIS — Z419 Encounter for procedure for purposes other than remedying health state, unspecified: Secondary | ICD-10-CM

## 2015-02-07 DIAGNOSIS — Z79899 Other long term (current) drug therapy: Secondary | ICD-10-CM | POA: Diagnosis not present

## 2015-02-07 DIAGNOSIS — S72002A Fracture of unspecified part of neck of left femur, initial encounter for closed fracture: Secondary | ICD-10-CM | POA: Diagnosis present

## 2015-02-07 LAB — CBC WITH DIFFERENTIAL/PLATELET
Basophils Absolute: 0 10*3/uL (ref 0.0–0.1)
Basophils Relative: 0 %
Eosinophils Absolute: 0.1 10*3/uL (ref 0.0–0.7)
Eosinophils Relative: 1 %
HEMATOCRIT: 38.7 % (ref 36.0–46.0)
HEMOGLOBIN: 12.3 g/dL (ref 12.0–15.0)
LYMPHS ABS: 2.2 10*3/uL (ref 0.7–4.0)
Lymphocytes Relative: 23 %
MCH: 30.4 pg (ref 26.0–34.0)
MCHC: 31.8 g/dL (ref 30.0–36.0)
MCV: 95.6 fL (ref 78.0–100.0)
MONOS PCT: 8 %
Monocytes Absolute: 0.7 10*3/uL (ref 0.1–1.0)
NEUTROS ABS: 6.3 10*3/uL (ref 1.7–7.7)
NEUTROS PCT: 68 %
Platelets: 289 10*3/uL (ref 150–400)
RBC: 4.05 MIL/uL (ref 3.87–5.11)
RDW: 15.1 % (ref 11.5–15.5)
WBC: 9.3 10*3/uL (ref 4.0–10.5)

## 2015-02-07 LAB — PROTIME-INR
INR: 0.96 (ref 0.00–1.49)
PROTHROMBIN TIME: 13 s (ref 11.6–15.2)

## 2015-02-07 LAB — BASIC METABOLIC PANEL
Anion gap: 6 (ref 5–15)
BUN: 20 mg/dL (ref 6–20)
CHLORIDE: 110 mmol/L (ref 101–111)
CO2: 26 mmol/L (ref 22–32)
CREATININE: 0.6 mg/dL (ref 0.44–1.00)
Calcium: 8.4 mg/dL — ABNORMAL LOW (ref 8.9–10.3)
GFR calc non Af Amer: >60 mL/min (ref >60)
Glucose, Bld: 87 mg/dL (ref 65–99)
POTASSIUM: 4.1 mmol/L (ref 3.5–5.1)
Sodium: 142 mmol/L (ref 135–145)

## 2015-02-07 LAB — SURGICAL PCR SCREEN
MRSA, PCR: NEGATIVE
Staphylococcus aureus: NEGATIVE

## 2015-02-07 MED ORDER — MORPHINE SULFATE (PF) 4 MG/ML IV SOLN
4.0000 mg | Freq: Once | INTRAVENOUS | Status: AC
  Administered 2015-02-07: 4 mg via INTRAVENOUS
  Filled 2015-02-07: qty 1

## 2015-02-07 MED ORDER — ONDANSETRON HCL 4 MG/2ML IJ SOLN
4.0000 mg | Freq: Once | INTRAMUSCULAR | Status: AC
  Administered 2015-02-07: 4 mg via INTRAVENOUS
  Filled 2015-02-07: qty 2

## 2015-02-07 MED ORDER — ONDANSETRON HCL 4 MG/2ML IJ SOLN
4.0000 mg | Freq: Four times a day (QID) | INTRAMUSCULAR | Status: DC | PRN
  Administered 2015-02-08: 4 mg via INTRAVENOUS

## 2015-02-07 MED ORDER — ASPIRIN 81 MG PO TABS
81.0000 mg | ORAL_TABLET | Freq: Every day | ORAL | Status: DC
Start: 2015-02-07 — End: 2015-02-08
  Filled 2015-02-07 (×2): qty 1

## 2015-02-07 MED ORDER — ATORVASTATIN CALCIUM 20 MG PO TABS
20.0000 mg | ORAL_TABLET | Freq: Every day | ORAL | Status: DC
  Administered 2015-02-08 – 2015-02-10 (×3): 20 mg via ORAL
  Filled 2015-02-07 (×5): qty 1

## 2015-02-07 MED ORDER — NICOTINE 14 MG/24HR TD PT24
14.0000 mg | MEDICATED_PATCH | Freq: Every day | TRANSDERMAL | Status: DC
  Administered 2015-02-08 – 2015-02-09 (×2): 14 mg via TRANSDERMAL
  Filled 2015-02-07 (×4): qty 1

## 2015-02-07 MED ORDER — ACETAMINOPHEN 325 MG PO TABS
650.0000 mg | ORAL_TABLET | Freq: Four times a day (QID) | ORAL | Status: DC | PRN
  Administered 2015-02-07: 650 mg via ORAL
  Filled 2015-02-07 (×2): qty 2

## 2015-02-07 MED ORDER — ONDANSETRON HCL 4 MG PO TABS: 4.0000 mg | ORAL_TABLET | Freq: Four times a day (QID) | ORAL | Status: DC | PRN

## 2015-02-07 MED ORDER — ACETAMINOPHEN 650 MG RE SUPP: 650.0000 mg | Freq: Four times a day (QID) | RECTAL | Status: DC | PRN

## 2015-02-07 MED ORDER — GABAPENTIN 300 MG PO CAPS
600.0000 mg | ORAL_CAPSULE | Freq: Every day | ORAL | Status: DC
Start: 2015-02-07 — End: 2015-02-10
  Administered 2015-02-07 – 2015-02-09 (×3): 600 mg via ORAL
  Filled 2015-02-07 (×5): qty 2

## 2015-02-07 MED ORDER — INFLUENZA VAC SPLIT QUAD 0.5 ML IM SUSY
0.5000 mL | PREFILLED_SYRINGE | INTRAMUSCULAR | Status: AC
  Administered 2015-02-08: 0.5 mL via INTRAMUSCULAR
  Filled 2015-02-07 (×2): qty 0.5

## 2015-02-07 MED ORDER — GABAPENTIN 300 MG PO CAPS
300.0000 mg | ORAL_CAPSULE | Freq: Every day | ORAL | Status: DC | PRN
  Filled 2015-02-07: qty 1

## 2015-02-07 MED ORDER — HEPARIN SODIUM (PORCINE) 5000 UNIT/ML IJ SOLN
5000.0000 [IU] | Freq: Three times a day (TID) | INTRAMUSCULAR | Status: DC
  Administered 2015-02-07: 5000 [IU] via SUBCUTANEOUS
  Filled 2015-02-07 (×5): qty 1

## 2015-02-07 MED ORDER — METOPROLOL TARTRATE 25 MG PO TABS
25.0000 mg | ORAL_TABLET | Freq: Two times a day (BID) | ORAL | Status: DC
  Administered 2015-02-07 – 2015-02-10 (×6): 25 mg via ORAL
  Filled 2015-02-07 (×9): qty 1

## 2015-02-07 MED ORDER — SENNOSIDES-DOCUSATE SODIUM 8.6-50 MG PO TABS
1.0000 | ORAL_TABLET | Freq: Every evening | ORAL | Status: DC | PRN
  Administered 2015-02-09: 1 via ORAL
  Filled 2015-02-07: qty 1

## 2015-02-07 MED ORDER — AMLODIPINE BESYLATE 5 MG PO TABS
5.0000 mg | ORAL_TABLET | Freq: Every day | ORAL | Status: DC
  Administered 2015-02-08 – 2015-02-10 (×3): 5 mg via ORAL
  Filled 2015-02-07 (×4): qty 1

## 2015-02-07 MED ORDER — OMEPRAZOLE MAGNESIUM 20 MG PO TBEC
20.0000 mg | DELAYED_RELEASE_TABLET | Freq: Every day | ORAL | Status: DC
  Administered 2015-02-09 – 2015-02-10 (×2): 20 mg via ORAL
  Filled 2015-02-07 (×5): qty 1

## 2015-02-07 NOTE — Consult Note (Signed)
Reason for Consult:  Left hip fracture Referring Physician:   EDP  Sara BrickRebecca S [name] is an 74 [name].o. female.  HPI:   74 yo female who sustained an accidental mechanical fall last night.  With significant left hip pain this am and difficulty ambulating, was brought to the [facility] and found to have a left non-displaced femoral neck/hip fracture.  She did request another ortho group in town who has taken care of her in the past, but that group is apparently unavailable.  Since I am on-call of unassigned consults, I was then contacted and am happy to see the patient.  She denies other injuries and only reports severe left hip pain.  She denies any syncopal episode as well as denies CP or SOB.  Past Medical History  Diagnosis Date  . Mitral valve regurgitation     trace  . Hypercholesteremia   . Arthritis   . H/O hiatal hernia   . Stroke (HCC)   . Coronary artery disease     sees Dr. Patty [name]  . Hypertension     all managed by Dr. Haynes DageBrack [name]  . Anxiety   . Bronchitis     hx of  . GERD (gastroesophageal reflux disease)     hx of  . Carotid artery occlusion     Past Surgical History  Procedure Laterality Date  . Cardiac catheterization  1994  . Hysterotomy    . Carpal tunnel release      left  . Hand surgery      bilateral  . Cholecystectomy    . Tee without cardioversion  04/12/2012    Procedure: TRANSESOPHAGEAL ECHOCARDIOGRAM (TEE);  Surgeon: Sara BuntingBrian S Crenshaw, MD;  [facility] END[facility]COPY;  Service: Cardiovascular;  Laterality: N/A;  . Back surgery      x 3 Dr Juliene PinaGeoffrey, [facility] ~ 2005, rods, fusion, four surgery  . Endarterectomy  04/24/2012    Procedure: ENDARTERECTOMY CAROTID;  Surgeon: Sara Earthlyodd F Early, MD;  [facility];  Service: Vascular;  Laterality: Left;  . Carotid endarterectomy    . Spinal cord stimulator insertion      Family History  Problem Relation Age of Onset  . Heart attack Father   . Heart disease Father     before age 76  . Heart disease  Sister   . Cancer Sister   . Heart disease Sister     Social History:  reports that she has been smoking Cigarettes.  She has a 30 pack-year smoking history. She has never used smokeless tobacco. She reports that she does not drink alcohol or use illicit drugs.  Allergies:  Allergies  Allergen Reactions  . Percocet [Oxycodone-Acetaminophen] Rash  . Hydrochlorothiazide     Pt does not remember  . Indomethacin     Pt does not remember  . Paroxetine Hcl     Pt does not remember  . Penicillins     Has patient had a PCN reaction causing immediate rash, facial/tongue/throat swelling, SOB or lightheadedness with hypotension: Yes Has patient had a PCN reaction causing severe rash involving mucus membranes or skin necrosis: No Has patient had a PCN reaction that required hospitalization No Has patient had a PCN reaction occurring within the last 10 years: No If all of the above answers are "NO", then may proceed with Cephalosporin use.   . Pravachol     Pt does not remember  . Quinine Derivatives     Pt does not remember  . Wellbutrin [  Bupropion]     agitation  . Zocor [Simvastatin - High Dose] Other (See Comments)    Weakness/no energy    Medications: I have reviewed the patient's current medications.  No results found for this or any previous visit (from the past 48 hour(s)).  Dg Hip Unilat With Pelvis 2-3 Views Left  02/07/2015  CLINICAL DATA:  Fall 1 day ago with left hip pain. EXAM: DG HIP (WITH OR WITHOUT PELVIS) 2-3V LEFT COMPARISON:  None. FINDINGS: There is a subtle nondisplaced left femoral neck fracture noted with slight valgus angulation. Slight impaction laterally. No subluxation or dislocation. IMPRESSION: Subtle nondisplaced left femoral neck fracture with slight valgus angulation. Electronically Signed   By: Sara [name] M.D.   On: 02/07/2015 13:34    Review of Systems  Musculoskeletal: Positive for back pain.  All other systems reviewed and are negative.  Blood  pressure 153/90, pulse 82, temperature 98.2 F (36.8 C), temperature source Oral, resp. rate 18, height  (1.651 m), weight 56.7 kg (125 lb), SpO2 96 %. Physical Exam  Constitutional: She is oriented to person, place, and time. She appears well-developed and well-nourished.  HENT:  Head: Normocephalic and atraumatic.  Eyes: EOM are normal. Pupils are equal, round, and reactive to light.  Neck: Normal range of motion. Neck supple.  Cardiovascular: Normal rate and regular rhythm.   Respiratory: Effort normal and breath sounds normal.  GI: Soft. Bowel sounds are normal.  Musculoskeletal:       Left hip: She exhibits decreased range of motion, decreased strength, tenderness and bony tenderness.  Neurological: She is alert and oriented to person, place, and time.  Skin: Skin is warm and dry.  Psychiatric: She has a normal mood and affect.    Assessment/Plan: Left hip with non-displaced hip fracture of the femoral neck 1)  Will need surgery to address her left hip fracture.  I have spoken to her in length about the need for surgery and the surgical options.  Will plan to proceed to the OR first thing in the am tomorrow morning for cannulated screws to fix her left hip.  I feel that is the best option given her fracture and the risks and benefits involved including the risk of non-union and the need for further surgery down the road.  Sara [name] 02/07/2015, 3:02 PM

## 2015-02-07 NOTE — ED Provider Notes (Signed)
CSN: [id number]     Arrival date & time 02/07/15  1153 History   First MD Initiated Contact with Patient 02/07/15 1208     Chief Complaint  Patient presents with  . Fall     (Consider location/radiation/quality/duration/timing/severity/associated sxs/prior Treatment) HPI Comments: Patient presents to the ER for evaluation of left hip injury*fall. Patient fell yesterday evening. She reports that she lost her balance while trying to get into bed, causing the fall. She landed directly on her left hip, complaining of pain on the outside portion of the hip that is constant and worsens with movement. Patient reports that she did have immediate pain but was able to get herself off the floor and into bed last night, but awakened with increased pain this morning. She did not hit her head or lose consciousness. No neck or back pain.  Patient is a 74 y.o. female presenting with fall.  Fall    Past Medical History  Diagnosis Date  . Mitral valve regurgitation     trace  . Hypercholesteremia   . Arthritis   . H/O hiatal hernia   . Stroke (HCC)   . Coronary artery disease     sees Dr. Patty Sermons  . Hypertension     all managed by Dr. Haynes Dage bill  . Anxiety   . Bronchitis     hx of  . GERD (gastroesophageal reflux disease)     hx of  . Carotid artery occlusion    Past Surgical History  Procedure Laterality Date  . Cardiac catheterization  1994  . Hysterotomy    . Carpal tunnel release      left  . Hand surgery      bilateral  . Cholecystectomy    . Tee without cardioversion  04/12/2012    Procedure: TRANSESOPHAGEAL ECHOCARDIOGRAM (TEE);  Surgeon: Lewayne Bunting, MD;  Location: [facility] ENDOSCOPY;  Service: Cardiovascular;  Laterality: N/A;  . Back surgery      x 3 Dr Juliene Pina, last @ [facility] ~ 2005, rods, fusion, four surgery  . Endarterectomy  04/24/2012    Procedure: ENDARTERECTOMY CAROTID;  Surgeon: Larina Earthly, MD;  Location: [facility] OR;  Service: Vascular;  Laterality: Left;  .  Carotid endarterectomy    . Spinal cord stimulator insertion     Family History  Problem Relation Age of Onset  . Heart attack Father   . Heart disease Father     before age 4  . Heart disease Sister   . Cancer Sister   . Heart disease Sister    Social History  Substance Use Topics  . Smoking status: Current Every Day Smoker -- 0.50 packs/day for 60 years    Types: Cigarettes  . Smokeless tobacco: Never Used  . Alcohol Use: No   OB History    No data available     Review of Systems  Musculoskeletal: Positive for arthralgias.  All other systems reviewed and are negative.     Allergies  Percocet; Hydrochlorothiazide; Indomethacin; Paroxetine hcl; Penicillins; Pravachol; Quinine derivatives; Wellbutrin; and Zocor  Home Medications   Prior to Admission medications   Medication Sig Start Date End Date Taking? Authorizing Provider  amLODipine (NORVASC) 10 MG tablet TAKE 1 TABLET (10 MG TOTAL) BY MOUTH DAILY. Patient taking differently: take 5 mgs once daily 11/04/14  Yes Cassell Clement, MD  aspirin 81 MG tablet Take 81 mg by mouth daily.   Yes Historical Provider, MD  atorvastatin (LIPITOR) 40 MG tablet TAKE 1/2 TABLET BY MOUTH DAILY  01/06/15  Yes Cassell Clement, MD  diazepam (VALIUM) 5 MG tablet TAKE 1 TABLET BY MOUTH EVERY DAY AS NEEDED FOR ANXIETY Patient taking differently: TAKE 2.5 mg TABLET BY MOUTH EVERY DAY AS NEEDED FOR ANXIETY 01/21/15  Yes Cassell Clement, MD  gabapentin (NEURONTIN) 300 MG capsule 300-600 mg. take 2 tablets every night, and take 1 tablet as needed in the morning 04/24/13  Yes Cassell Clement, MD  HYDROcodone-acetaminophen (NORCO) 10-325 MG per tablet Take 1 tablet by mouth every 6 (six) hours as needed for moderate pain (for pain).  05/10/14  Yes Historical Provider, MD  metoprolol (LOPRESSOR) 50 MG tablet TAKE 1/2 TABLET BY MOUTH TWICE A DAY 01/06/15  Yes Cassell Clement, MD  nitroGLYCERIN (NITROSTAT) 0.4 MG SL tablet Place 0.4 mg under the  tongue every 5 (five) minutes as needed (for chest pain).    Yes Historical Provider, MD  omeprazole (PRILOSEC OTC) 20 MG tablet Take 20 mg by mouth daily.   Yes Historical Provider, MD  sertraline (ZOLOFT) 25 MG tablet TAKE 1 TABLET (25 MG TOTAL) BY MOUTH DAILY. 09/25/14   Sheliah Hatch, MD  valsartan (DIOVAN) 320 MG tablet TAKE 1 TABLET BY MOUTH EVERY DAY 04/18/13   Rollene Rotunda, MD   BP 143/55 mmHg  Pulse 79  Temp(Src) 98.2 F (36.8 C) (Oral)  Resp 19  Ht  (1.651 m)  Wt 125 lb (56.7 kg)  BMI 20.80 kg/m2  SpO2 98% Physical Exam  Constitutional: She is oriented to person, place, and time. She appears well-developed and well-nourished. No distress.  HENT:  Head: Normocephalic and atraumatic.  Right Ear: Hearing normal.  Left Ear: Hearing normal.  Nose: Nose normal.  Mouth/Throat: Oropharynx is clear and moist and mucous membranes are normal.  Eyes: Conjunctivae and EOM are normal. Pupils are equal, round, and reactive to light.  Neck: Normal range of motion. Neck supple.  Cardiovascular: Regular rhythm, S1 normal and S2 normal.  Exam reveals no gallop and no friction rub.   No murmur heard. Pulmonary/Chest: Effort normal and breath sounds normal. No respiratory distress. She exhibits no tenderness.  Abdominal: Soft. Normal appearance and bowel sounds are normal. There is no hepatosplenomegaly. There is no tenderness. There is no rebound, no guarding, no tenderness at McBurney's point and negative Murphy's sign. No hernia.  Musculoskeletal:       Left hip: She exhibits decreased range of motion, decreased strength and tenderness.  Neurological: She is alert and oriented to person, place, and time. She has normal strength. No cranial nerve deficit or sensory deficit. Coordination normal. GCS eye subscore is 4. GCS verbal subscore is 5. GCS motor subscore is 6.  Skin: Skin is warm, dry and intact. No rash noted. No cyanosis.  Psychiatric: She has a normal mood and affect. Her  speech is normal and behavior is normal. Thought content normal.  Nursing note and vitals reviewed.   ED Course  Procedures (including critical care time) Labs Review Labs Reviewed - No data to display  Imaging Review No results found. I have personally reviewed and evaluated these images and lab results as part of my medical decision-making.   EKG Interpretation None      MDM   Final diagnoses:  None   left femoral neck fracture  Patient presents to the ER with isolated left hip injury. Patient fell last night. Initially she was able to get up and get back into bed, but this morning she was unable to get out of bed because  of pain. Workup reveals evidence of femoral neck fracture. There is no other evidence of injury elsewhere. Patient will be hospitalized for surgical repair.  Patient reportedly [facility]. I did try to contact [facility], but the office called be back in told me that the patient would need to be seen by the backup orthopedic surgeon here at [facility] because their doctor was on call at [facility]. I contacted Dr. Magnus IvanBlackman, on [facility] for [facility] and he has agreed to see the patient. Patient will not have surgery until tomorrow, can be fed today.    Gilda Creasehristopher J Pollina, MD 02/07/15 1435

## 2015-02-07 NOTE — Progress Notes (Signed)
EDCM spoke  To patient at bedside.  Patient presents to Ed post fall at home with injury to left hip.  Patient reports she would like to return home post discharge.  Patient reports she lives at home with her husband who is well per patient.  Patient's daughters at bedside Nelva Bushina Thomas [phone].  Patient reports she has a walker, cane, wheelchair and shower bench at home.  She does not have a bedside commode.  Patient reports she has never had home health services before.  Patient reports her pcp is Dr. Drue NovelPaz.  EDCM provided patient with list of home health agencies [place], explained services.  EDCM provided patient with list of private duty nursing services and explained it would be an out of pocket expense.  Patient and family thankful for services.  Patient requesting pain medicine, EDRN made aware.  No further EDCM needs at this time.

## 2015-02-07 NOTE — ED Notes (Addendum)
RN drawing Labs

## 2015-02-07 NOTE — ED Notes (Signed)
Nurse drawing labs. 

## 2015-02-07 NOTE — ED Notes (Addendum)
Pt transported by [facility] from home.  Pt fell last evening after losing her balance.  Pt got into bed without assistance and woke up this morning with increased pain and EMS was called.  Pt c/o left hip pain.  Obvious shortening of left leg on exam.

## 2015-02-07 NOTE — ED Notes (Signed)
Bed: WA06 Expected date:  Expected time:  Means of arrival:  Comments: EMS- 74yo F, hip pain/fracture?

## 2015-02-07 NOTE — H&P (Signed)
[facility] History and Physical  Sara [name] YPP:[id number] DOB: 12/18/1940 DOA: 02/07/2015  Referring physician: Emergency Department PCP: Neena RhymesKatherine Tabori, MD  Specialists:   Chief Complaint: L hip pain,Sara Brick difficulty ambulating  HPI: Sara BrickRebecca S [name] is a 74 y.o. female  With a hx of HTN, CAD, HTN, prior CVA who presented to ED with difficulty ambulating and L sided hip pain after mechanical fall the day prior. No LOC. In the ED, Pt noted to have nondisplaced L femoral neck fracture. Orthopedic surgery was consulted with recs for surgery on 10/15. Hospitalist consulted for admission.  Review of Systems:  Review of Systems  Constitutional: Negative for fever and chills.  HENT: Negative for ear pain and tinnitus.   Eyes: Negative for pain and discharge.  Respiratory: Negative for shortness of breath and wheezing.   Cardiovascular: Negative for chest pain and palpitations.  Gastrointestinal: Negative for vomiting and abdominal pain.  Genitourinary: Negative for urgency and frequency.  Musculoskeletal: Positive for joint pain and falls.  Skin: Negative for itching and rash.  Neurological: Negative for tingling, tremors and loss of consciousness.  Psychiatric/Behavioral: Negative for hallucinations and memory loss.     Past Medical History  Diagnosis Date  . Mitral valve regurgitation     trace  . Hypercholesteremia   . Arthritis   . H/O hiatal hernia   . Stroke (HCC)   . Coronary artery disease     sees Dr. Patty SermonsBrackbill  . Hypertension     all managed by Dr. Haynes DageBrack bill  . Anxiety   . Bronchitis     hx of  . GERD (gastroesophageal reflux disease)     hx of  . Carotid artery occlusion    Past Surgical History  Procedure Laterality Date  . Cardiac catheterization  1994  . Hysterotomy    . Carpal tunnel release      left  . Hand surgery      bilateral  . Cholecystectomy    . Tee without cardioversion  04/12/2012    Procedure: TRANSESOPHAGEAL ECHOCARDIOGRAM  (TEE);  Surgeon: Lewayne BuntingBrian S Crenshaw, MD;  [facility];  Service: Cardiovascular;  Laterality: N/A;  . Back surgery      x 3 Dr Juliene PinaGeoffrey, [facility], rods, fusion, four surgery  . Endarterectomy  04/24/2012    Procedure: ENDARTERECTOMY CAROTID;  Surgeon: Larina Earthlyodd F Early, MD;  [facility];  Service: Vascular;  Laterality: Left;  . Carotid endarterectomy    . Spinal cord stimulator insertion     Social History:  reports that she has been smoking Cigarettes.  She has a 30 pack-year smoking history. She has never used smokeless tobacco. She reports that she does not drink alcohol or use illicit drugs.  where does patient live--home, ALF, SNF? and with whom if at home?  Can patient participate in ADLs?  Allergies  Allergen Reactions  . Percocet [Oxycodone-Acetaminophen] Rash  . Hydrochlorothiazide     Pt does not remember  . Indomethacin     Pt does not remember  . Paroxetine Hcl     Pt does not remember  . Penicillins     Has patient had a PCN reaction causing immediate rash, facial/tongue/throat swelling, SOB or lightheadedness with hypotension: Yes Has patient had a PCN reaction causing severe rash involving mucus membranes or skin necrosis: No Has patient had a PCN reaction that required hospitalization No Has patient had a PCN reaction occurring within the last 10 years: No If all of the  above answers are "NO", then may proceed with Cephalosporin use.   . Pravachol     Pt does not remember  . Quinine Derivatives     Pt does not remember  . Wellbutrin [Bupropion]     agitation  . Zocor [Simvastatin - High Dose] Other (See Comments)    Weakness/no energy    Family History  Problem Relation Age of Onset  . Heart attack Father   . Heart disease Father     before age 35  . Heart disease Sister   . Cancer Sister   . Heart disease Sister     (be sure to complete)  Prior to Admission medications   Medication Sig Start Date End Date Taking? Authorizing  Provider  amLODipine (NORVASC) 10 MG tablet TAKE 1 TABLET (10 MG TOTAL) BY MOUTH DAILY. Patient taking differently: take 5 mgs once daily 11/04/14  Yes Cassell Clement, MD  aspirin 81 MG tablet Take 81 mg by mouth daily.   Yes Historical Provider, MD  atorvastatin (LIPITOR) 40 MG tablet TAKE 1/2 TABLET BY MOUTH DAILY 01/06/15  Yes Cassell Clement, MD  diazepam (VALIUM) 5 MG tablet TAKE 1 TABLET BY MOUTH EVERY DAY AS NEEDED FOR ANXIETY Patient taking differently: TAKE 2.5 mg TABLET BY MOUTH EVERY DAY AS NEEDED FOR ANXIETY 01/21/15  Yes Cassell Clement, MD  gabapentin (NEURONTIN) 300 MG capsule 300-600 mg. take 2 tablets every night, and take 1 tablet as needed in the morning 04/24/13  Yes Cassell Clement, MD  HYDROcodone-acetaminophen (NORCO) 10-325 MG per tablet Take 1 tablet by mouth every 6 (six) hours as needed for moderate pain (for pain).  05/10/14  Yes Historical Provider, MD  metoprolol (LOPRESSOR) 50 MG tablet TAKE 1/2 TABLET BY MOUTH TWICE A DAY 01/06/15  Yes Cassell Clement, MD  nitroGLYCERIN (NITROSTAT) 0.4 MG SL tablet Place 0.4 mg under the tongue every 5 (five) minutes as needed (for chest pain).    Yes Historical Provider, MD  omeprazole (PRILOSEC OTC) 20 MG tablet Take 20 mg by mouth daily.   Yes Historical Provider, MD  sertraline (ZOLOFT) 25 MG tablet TAKE 1 TABLET (25 MG TOTAL) BY MOUTH DAILY. 09/25/14   Sheliah Hatch, MD  valsartan (DIOVAN) 320 MG tablet TAKE 1 TABLET BY MOUTH EVERY DAY 04/18/13   Rollene Rotunda, MD   Physical Exam: Filed Vitals:   02/07/15 1210 02/07/15 1224 02/07/15 1434 02/07/15 1639  BP: 143/55 143/55 153/90 123/57  Pulse: 78 79 82 81  Temp: 98.2 F (36.8 C)     TempSrc: Oral     Resp: Height:   (1.651 m)    Weight:  56.7 kg (125 lb)    SpO2: 95% 98% 96% 95%     General:  Awake, in nad  Eyes: PERRL B   ENT: membranes moist, dentition fair  Neck: trachea midline, neck supple  Cardiovascular: regular, s1,  s2  Respiratory: normal resp effort, no wheezing  Abdomen: soft,nondistended  Skin: normal skin turgor, no abnormal skin lesions seen  Musculoskeletal: perfused, no clubbing  Psychiatric: mood/affect normal// no auditory/visual hallucinations  Neurologic: cn2-12 grossly intact, strength/sensstion intact  Labs on Admission:  Basic Metabolic Panel:  Recent Labs Lab 02/07/15 1436  NA 142  K 4.1  CL 110  CO2 26  GLUCOSE 87  BUN 20  CREATININE 0.60  CALCIUM 8.4*   Liver Function Tests: No results for input(s): AST, ALT, ALKPHOS, BILITOT, PROT, ALBUMIN in the last 168 hours. No  results for input(s): LIPASE, AMYLASE in the last 168 hours. No results for input(s): AMMONIA in the last 168 hours. CBC:  Recent Labs Lab 02/07/15 1436  WBC 9.3  NEUTROABS 6.3  HGB 12.3  HCT 38.7  MCV 95.6  PLT 289   Cardiac Enzymes: No results for input(s): CKTOTAL, CKMB, CKMBINDEX, TROPONINI in the last 168 hours.  BNP (last 3 results) No results for input(s): BNP in the last 8760 hours.  ProBNP (last 3 results) No results for input(s): PROBNP in the last 8760 hours.  CBG: No results for input(s): GLUCAP in the last 168 hours.  Radiological Exams on Admission: Dg Chest 1 View  02/07/2015  CLINICAL DATA:  Fall today.  Preop for hip surgery. EXAM: CHEST  1 VIEW COMPARISON:  Two-view chest x-ray 04/14/2012. FINDINGS: Heart is mildly enlarged. Atherosclerotic changes are seen in the aortic arch. Lungs are clear. There is no edema or effusion to suggest failure. The visualized soft tissues and bony thorax are unremarkable. IMPRESSION: 1. Borderline cardiomegaly without failure. 2. No acute cardiopulmonary disease. Electronically Signed   By: Marin Roberts M.D.   On: 02/07/2015 15:13   Dg Hip Unilat With Pelvis 2-3 Views Left  02/07/2015  CLINICAL DATA:  Fall 1 day ago with left hip pain. EXAM: DG HIP (WITH OR WITHOUT PELVIS) 2-3V LEFT COMPARISON:  None. FINDINGS: There is a subtle  nondisplaced left femoral neck fracture noted with slight valgus angulation. Slight impaction laterally. No subluxation or dislocation. IMPRESSION: Subtle nondisplaced left femoral neck fracture with slight valgus angulation. Electronically Signed   By: Charlett Nose M.D.   On: 02/07/2015 13:34    EKG: Independently reviewed. NSR  Assessment/Plan Principal Problem:   Hip fracture (HCC) Active Problems:   Mitral valve regurgitation   Hypertension   Hypercholesteremia   Coronary artery disease   Tobacco abuse   1. L hip fracture 1. S/p mechanical fall 2. Orthopedic surgery consulted with plans for surgery on 10/15. 3. Will keep NPO after midnight 2. CAD 1. hx 3 vessel disease seen on 1994 cath , not needing stenting, with normal adenosine stress in 2009 2. Denies chest pain or sob 3. EKG is unremarkable 4. Followed by Dr. Patty Sermons 3. HTN 1. BP stable 2. Cont home regimen 4. Mild MVR 1. Noted on 2013 2d echo 5. HLD 1. Cont on statin per home regimen 6. Tobacco abuse 1. Cessation done 7. DVT prophylaxis 1. Heparin subQ  Code Status: Full (must indicate code status--if unknown or must be presumed, indicate so) Family Communication: Pt in room (indicate person spoken with, if applicable, with phone number if by telephone) Disposition Plan: Admit to med-surg (indicate anticipated LOS)   CHIU, STEPHEN K [facility] Pager [phone]  If 7PM-7AM, please contact night-coverage [email or web address] Password [email or web address] 02/07/2015, 4:44 PM

## 2015-02-07 NOTE — ED Notes (Signed)
Attempted to call report and was told by secretary they needed [facility]  Was told to call back

## 2015-02-08 ENCOUNTER — Encounter (HOSPITAL_COMMUNITY)
Admission: EM | Disposition: A | Discharge status: Home Health | Payer: Self-pay | Source: Home / Self Care | Attending: Internal Medicine

## 2015-02-08 ENCOUNTER — Inpatient Hospital Stay (HOSPITAL_COMMUNITY): Payer: Medicare Other

## 2015-02-08 ENCOUNTER — Inpatient Hospital Stay (HOSPITAL_COMMUNITY): Payer: Medicare Other | Admitting: Anesthesiology

## 2015-02-08 DIAGNOSIS — S72002A Fracture of unspecified part of neck of left femur, initial encounter for closed fracture: Secondary | ICD-10-CM | POA: Diagnosis not present

## 2015-02-08 HISTORY — PX: HIP PINNING,CANNULATED: SHX1758

## 2015-02-08 LAB — COMPREHENSIVE METABOLIC PANEL WITH GFR
ALT: 35 U/L (ref 14–54)
AST: 24 U/L (ref 15–41)
Albumin: 2.7 g/dL — ABNORMAL LOW (ref 3.5–5.0)
Alkaline Phosphatase: 84 U/L (ref 38–126)
Anion gap: 6 (ref 5–15)
BUN: 18 mg/dL (ref 6–20)
CO2: 25 mmol/L (ref 22–32)
Calcium: 8.5 mg/dL — ABNORMAL LOW (ref 8.9–10.3)
Chloride: 109 mmol/L (ref 101–111)
Creatinine, Ser: 0.64 mg/dL (ref 0.44–1.00)
GFR calc Af Amer: >60 mL/min
GFR calc non Af Amer: >60 mL/min
Glucose, Bld: 114 mg/dL — ABNORMAL HIGH (ref 65–99)
Potassium: 4 mmol/L (ref 3.5–5.1)
Sodium: 140 mmol/L (ref 135–145)
Total Bilirubin: 0.7 mg/dL (ref 0.3–1.2)
Total Protein: 6 g/dL — ABNORMAL LOW (ref 6.5–8.1)

## 2015-02-08 LAB — CBC
HEMATOCRIT: 35.1 % — AB (ref 36.0–46.0)
HEMOGLOBIN: 11.5 g/dL — AB (ref 12.0–15.0)
MCH: 30.6 pg (ref 26.0–34.0)
MCHC: 32.8 g/dL (ref 30.0–36.0)
MCV: 93.4 fL (ref 78.0–100.0)
Platelets: 246 10*3/uL (ref 150–400)
RBC: 3.76 MIL/uL — AB (ref 3.87–5.11)
RDW: 14.9 % (ref 11.5–15.5)
WBC: 8.9 10*3/uL (ref 4.0–10.5)

## 2015-02-08 SURGERY — FIXATION, FEMUR, NECK, PERCUTANEOUS, USING SCREW
Anesthesia: General | Site: Hip | Laterality: Left

## 2015-02-08 MED ORDER — PROPOFOL 10 MG/ML IV BOLUS
INTRAVENOUS | Status: AC
  Filled 2015-02-08: qty 20

## 2015-02-08 MED ORDER — ONDANSETRON HCL 4 MG/2ML IJ SOLN: 4.0000 mg | Freq: Four times a day (QID) | INTRAMUSCULAR | Status: DC | PRN

## 2015-02-08 MED ORDER — PHENOL 1.4 % MT LIQD
1.0000 | OROMUCOSAL | Status: DC | PRN
  Filled 2015-02-08: qty 177

## 2015-02-08 MED ORDER — METOCLOPRAMIDE HCL 5 MG/ML IJ SOLN: 5.0000 mg | Freq: Three times a day (TID) | INTRAMUSCULAR | Status: DC | PRN

## 2015-02-08 MED ORDER — HYDROMORPHONE HCL 1 MG/ML IJ SOLN
INTRAMUSCULAR | Status: AC
  Administered 2015-02-09: 0.5 mg via INTRAVENOUS
  Filled 2015-02-08: qty 1

## 2015-02-08 MED ORDER — ROCURONIUM BROMIDE 100 MG/10ML IV SOLN
INTRAVENOUS | Status: DC | PRN
  Administered 2015-02-08: 30 mg via INTRAVENOUS

## 2015-02-08 MED ORDER — MORPHINE SULFATE (PF) 2 MG/ML IV SOLN: 0.5000 mg | INTRAVENOUS | Status: DC | PRN

## 2015-02-08 MED ORDER — MENTHOL 3 MG MT LOZG: 1.0000 | LOZENGE | OROMUCOSAL | Status: DC | PRN

## 2015-02-08 MED ORDER — GLYCOPYRROLATE 0.2 MG/ML IJ SOLN
INTRAMUSCULAR | Status: DC | PRN
  Administered 2015-02-08: 0.3 mg via INTRAVENOUS

## 2015-02-08 MED ORDER — HYDROMORPHONE HCL 1 MG/ML IJ SOLN
INTRAMUSCULAR | Status: AC
  Administered 2015-02-08: 0.5 mg via INTRAVENOUS
  Filled 2015-02-08: qty 1

## 2015-02-08 MED ORDER — HYDROMORPHONE HCL 1 MG/ML IJ SOLN
0.5000 mg | INTRAMUSCULAR | Status: DC | PRN
  Administered 2015-02-08 – 2015-02-09 (×5): 0.5 mg via INTRAVENOUS
  Filled 2015-02-08 (×5): qty 1

## 2015-02-08 MED ORDER — CEFAZOLIN SODIUM-DEXTROSE 2-3 GM-% IV SOLR
INTRAVENOUS | Status: AC
  Filled 2015-02-08: qty 50

## 2015-02-08 MED ORDER — METHOCARBAMOL 500 MG PO TABS
500.0000 mg | ORAL_TABLET | Freq: Four times a day (QID) | ORAL | Status: DC | PRN
  Administered 2015-02-09 – 2015-02-10 (×2): 500 mg via ORAL
  Filled 2015-02-08 (×2): qty 1

## 2015-02-08 MED ORDER — CEFAZOLIN SODIUM-DEXTROSE 2-3 GM-% IV SOLR
2.0000 g | Freq: Four times a day (QID) | INTRAVENOUS | Status: AC
  Administered 2015-02-08 (×2): 2 g via INTRAVENOUS
  Filled 2015-02-08 (×2): qty 50

## 2015-02-08 MED ORDER — LIDOCAINE HCL (CARDIAC) 20 MG/ML IV SOLN
INTRAVENOUS | Status: AC
  Filled 2015-02-08: qty 5

## 2015-02-08 MED ORDER — FENTANYL CITRATE (PF) 100 MCG/2ML IJ SOLN
INTRAMUSCULAR | Status: DC | PRN
  Administered 2015-02-08 (×2): 75 ug via INTRAVENOUS
  Administered 2015-02-08: 25 ug via INTRAVENOUS

## 2015-02-08 MED ORDER — ONDANSETRON HCL 4 MG PO TABS: 4.0000 mg | ORAL_TABLET | Freq: Four times a day (QID) | ORAL | Status: DC | PRN

## 2015-02-08 MED ORDER — OXYCODONE HCL 5 MG PO TABS: 5.0000 mg | ORAL_TABLET | ORAL | Status: DC | PRN

## 2015-02-08 MED ORDER — FENTANYL CITRATE (PF) 250 MCG/5ML IJ SOLN
INTRAMUSCULAR | Status: AC
  Filled 2015-02-08: qty 25

## 2015-02-08 MED ORDER — METHOCARBAMOL 1000 MG/10ML IJ SOLN
500.0000 mg | Freq: Four times a day (QID) | INTRAVENOUS | Status: DC | PRN
  Administered 2015-02-08: 500 mg via INTRAVENOUS
  Filled 2015-02-08 (×3): qty 5

## 2015-02-08 MED ORDER — CEFAZOLIN SODIUM-DEXTROSE 2-3 GM-% IV SOLR
2.0000 g | INTRAVENOUS | Status: AC
  Administered 2015-02-08: 2 g via INTRAVENOUS

## 2015-02-08 MED ORDER — METOCLOPRAMIDE HCL 10 MG PO TABS: 5.0000 mg | ORAL_TABLET | Freq: Three times a day (TID) | ORAL | Status: DC | PRN

## 2015-02-08 MED ORDER — PROPOFOL 10 MG/ML IV BOLUS
INTRAVENOUS | Status: DC | PRN
  Administered 2015-02-08: 80 mg via INTRAVENOUS

## 2015-02-08 MED ORDER — PHENYLEPHRINE 40 MCG/ML (10ML) SYRINGE FOR IV PUSH (FOR BLOOD PRESSURE SUPPORT)
PREFILLED_SYRINGE | INTRAVENOUS | Status: AC
  Filled 2015-02-08: qty 10

## 2015-02-08 MED ORDER — NEOSTIGMINE METHYLSULFATE 10 MG/10ML IV SOLN
INTRAVENOUS | Status: DC | PRN
  Administered 2015-02-08: 2 mg via INTRAVENOUS

## 2015-02-08 MED ORDER — ROCURONIUM BROMIDE 100 MG/10ML IV SOLN
INTRAVENOUS | Status: AC
  Filled 2015-02-08: qty 1

## 2015-02-08 MED ORDER — LIDOCAINE HCL (CARDIAC) 20 MG/ML IV SOLN
INTRAVENOUS | Status: DC | PRN
  Administered 2015-02-08: 60 mg via INTRAVENOUS
  Administered 2015-02-08: 40 mg via INTRAVENOUS

## 2015-02-08 MED ORDER — HYDROMORPHONE HCL 1 MG/ML IJ SOLN
0.2500 mg | INTRAMUSCULAR | Status: DC | PRN
  Administered 2015-02-08 (×5): 0.5 mg via INTRAVENOUS

## 2015-02-08 MED ORDER — HYDROCODONE-ACETAMINOPHEN 5-325 MG PO TABS
1.0000 | ORAL_TABLET | Freq: Four times a day (QID) | ORAL | Status: DC | PRN
  Administered 2015-02-08 – 2015-02-09 (×4): 2 via ORAL
  Filled 2015-02-08 (×4): qty 2

## 2015-02-08 MED ORDER — SODIUM CHLORIDE 0.9 % IJ SOLN
INTRAMUSCULAR | Status: AC
  Filled 2015-02-08: qty 10

## 2015-02-08 MED ORDER — ACETAMINOPHEN 650 MG RE SUPP: 650.0000 mg | Freq: Four times a day (QID) | RECTAL | Status: DC | PRN

## 2015-02-08 MED ORDER — EPHEDRINE SULFATE 50 MG/ML IJ SOLN
INTRAMUSCULAR | Status: AC
  Filled 2015-02-08: qty 1

## 2015-02-08 MED ORDER — 0.9 % SODIUM CHLORIDE (POUR BTL) OPTIME
TOPICAL | Status: DC | PRN
  Administered 2015-02-08: 1000 mL

## 2015-02-08 MED ORDER — ACETAMINOPHEN 325 MG PO TABS
650.0000 mg | ORAL_TABLET | Freq: Four times a day (QID) | ORAL | Status: DC | PRN
Start: 2015-02-08 — End: 2015-02-08

## 2015-02-08 MED ORDER — ONDANSETRON HCL 4 MG/2ML IJ SOLN
INTRAMUSCULAR | Status: AC
  Filled 2015-02-08: qty 2

## 2015-02-08 MED ORDER — ASPIRIN EC 325 MG PO TBEC
325.0000 mg | DELAYED_RELEASE_TABLET | Freq: Every day | ORAL | Status: DC
  Administered 2015-02-09 – 2015-02-10 (×2): 325 mg via ORAL
  Filled 2015-02-08 (×3): qty 1

## 2015-02-08 MED ORDER — PHENYLEPHRINE HCL 10 MG/ML IJ SOLN
INTRAMUSCULAR | Status: DC | PRN
  Administered 2015-02-08 (×2): 40 ug via INTRAVENOUS

## 2015-02-08 MED ORDER — LACTATED RINGERS IV SOLN
INTRAVENOUS | Status: DC | PRN
  Administered 2015-02-08: 08:00:00 via INTRAVENOUS

## 2015-02-08 SURGICAL SUPPLY — 68 items
BAG ZIPLOCK 12X15 (MISCELLANEOUS) ×4 IMPLANT
BANDAGE ELASTIC 6 VELCRO ST LF (GAUZE/BANDAGES/DRESSINGS) IMPLANT
BANDAGE ESMARK 6X9 LF (GAUZE/BANDAGES/DRESSINGS) IMPLANT
BIT DRILL 4.8X200 CANN (BIT) ×4 IMPLANT
BLADE SURG 15 STRL LF DISP TIS (BLADE) ×2 IMPLANT
BLADE SURG 15 STRL SS (BLADE) ×2
BNDG COHESIVE 6X5 TAN STRL LF (GAUZE/BANDAGES/DRESSINGS) IMPLANT
BNDG ESMARK 6X9 LF (GAUZE/BANDAGES/DRESSINGS)
BNDG GAUZE ELAST 4 BULKY (GAUZE/BANDAGES/DRESSINGS) ×8 IMPLANT
CLOSURE STERI-STRIP 1/4X4 (GAUZE/BANDAGES/DRESSINGS) ×4 IMPLANT
COVER PERINEAL POST (MISCELLANEOUS) ×4 IMPLANT
COVER SURGICAL LIGHT HANDLE (MISCELLANEOUS) IMPLANT
CUFF TOURN SGL QUICK 34 (TOURNIQUET CUFF)
CUFF TRNQT CYL 34X4X40X1 (TOURNIQUET CUFF) IMPLANT
DECANTER SPIKE VIAL GLASS SM (MISCELLANEOUS) IMPLANT
DRAPE PROXIMA HALF (DRAPES) IMPLANT
DRAPE STERI IOBAN 125X83 (DRAPES) ×4 IMPLANT
DRAPE U-SHAPE 47X51 STRL (DRAPES) ×4 IMPLANT
DRSG AQUACEL AG ADV 3.5X 4 (GAUZE/BANDAGES/DRESSINGS) ×4 IMPLANT
DRSG EMULSION OIL 3X16 NADH (GAUZE/BANDAGES/DRESSINGS) IMPLANT
DRSG MEPILEX BORDER 4X4 (GAUZE/BANDAGES/DRESSINGS) IMPLANT
DRSG MEPILEX BORDER 4X8 (GAUZE/BANDAGES/DRESSINGS) IMPLANT
DRSG PAD ABDOMINAL 8X10 ST (GAUZE/BANDAGES/DRESSINGS) ×12 IMPLANT
DURAPREP 26ML APPLICATOR (WOUND CARE) ×4 IMPLANT
ELECT REM PT RETURN 9FT ADLT (ELECTROSURGICAL) ×4
ELECTRODE REM PT RTRN 9FT ADLT (ELECTROSURGICAL) ×2 IMPLANT
FACESHIELD WRAPAROUND (MASK) ×4 IMPLANT
GAUZE SPONGE 4X4 12PLY STRL (GAUZE/BANDAGES/DRESSINGS) IMPLANT
GAUZE XEROFORM 5X9 LF (GAUZE/BANDAGES/DRESSINGS) IMPLANT
GLOVE BIO SURGEON STRL SZ7.5 (GLOVE) ×4 IMPLANT
GLOVE BIOGEL PI IND STRL 8 (GLOVE) ×2 IMPLANT
GLOVE BIOGEL PI INDICATOR 8 (GLOVE) ×2
GLOVE ECLIPSE 8.0 STRL XLNG CF (GLOVE) ×8 IMPLANT
GLOVE ORTHO TXT STRL SZ7.5 (GLOVE) ×4 IMPLANT
GOWN STRL REUS W/ TWL XL LVL3 (GOWN DISPOSABLE) IMPLANT
GOWN STRL REUS W/TWL LRG LVL3 (GOWN DISPOSABLE) ×4 IMPLANT
GOWN STRL REUS W/TWL XL LVL3 (GOWN DISPOSABLE) ×4 IMPLANT
IMMOBILIZER KNEE 20 (SOFTGOODS)
IMMOBILIZER KNEE 20 THIGH 36 (SOFTGOODS) IMPLANT
KIT BASIN OR (CUSTOM PROCEDURE TRAY) ×4 IMPLANT
KIT ROOM TURNOVER OR (KITS) ×4 IMPLANT
MANIFOLD NEPTUNE II (INSTRUMENTS) IMPLANT
NS IRRIG 1000ML POUR BTL (IV SOLUTION) ×4 IMPLANT
PACK GENERAL/GYN (CUSTOM PROCEDURE TRAY) ×4 IMPLANT
PACK ORTHO EXTREMITY (CUSTOM PROCEDURE TRAY) IMPLANT
PAD ARMBOARD 7.5X6 YLW CONV (MISCELLANEOUS) ×8 IMPLANT
PAD CAST 4YDX4 CTTN HI CHSV (CAST SUPPLIES) ×4 IMPLANT
PADDING CAST COTTON 4X4 STRL (CAST SUPPLIES) ×4
PADDING CAST COTTON 6X4 STRL (CAST SUPPLIES) IMPLANT
PASSER SUT SWANSON 36MM LOOP (INSTRUMENTS) IMPLANT
PIN GUIDE DRILL TIP 2.8X300 (DRILL) ×12 IMPLANT
POSITIONER SURGICAL ARM (MISCELLANEOUS) ×4 IMPLANT
SCREW CANN THRD 6.5X80 (Wire) ×12 IMPLANT
SCREW CANNULATED 6.5X75MM (Screw) ×4 IMPLANT
SPONGE LAP 18X18 X RAY DECT (DISPOSABLE) IMPLANT
SPONGE LAP 4X18 X RAY DECT (DISPOSABLE) IMPLANT
STAPLER VISISTAT 35W (STAPLE) IMPLANT
SUT ETHIBOND NAB CT1 #1 30IN (SUTURE) IMPLANT
SUT VIC AB 0 CT1 27 (SUTURE) ×6
SUT VIC AB 0 CT1 27XBRD ANBCTR (SUTURE) ×4 IMPLANT
SUT VIC AB 0 CT1 27XBRD ANTBC (SUTURE) ×2 IMPLANT
SUT VIC AB 1 CT1 27 (SUTURE) ×2
SUT VIC AB 1 CT1 27XBRD ANTBC (SUTURE) ×2 IMPLANT
SUT VIC AB 2-0 CT1 27 (SUTURE) ×6
SUT VIC AB 2-0 CT1 TAPERPNT 27 (SUTURE) ×6 IMPLANT
TOWEL OR 17X24 6PK STRL BLUE (TOWEL DISPOSABLE) ×4 IMPLANT
TOWEL OR 17X26 10 PK STRL BLUE (TOWEL DISPOSABLE) ×8 IMPLANT
WASHER FLAT 6.5MM (Washer) ×4 IMPLANT

## 2015-02-08 NOTE — Transfer of Care (Signed)
Immediate Anesthesia Transfer of Care Note  Patient: Sara [name]  Procedure(s) Performed: Procedure(s): CANNULATED HIP PINNING (Left)  Patient Location: PACU  Anesthesia Type:General  Level of Consciousness: awake, alert  and oriented  Airway & Oxygen Therapy: Patient Spontanous Breathing and Patient connected to face mask oxygen  Post-op Assessment: Report given to RN and Post -op Vital signs reviewed and stable  Post vital signs: Reviewed and stable  Last Vitals:  Filed Vitals:   02/08/15 0505  BP: 171/75  Pulse: 79  Temp: 36.6 C  Resp: 18    Complications: No apparent anesthesia complications

## 2015-02-08 NOTE — Brief Op Note (Signed)
02/07/2015 - 02/08/2015  9:16 AM  PATIENT:  Sara [name]  74 y.o. female  PRE-OPERATIVE DIAGNOSIS:  Left Hip Fracture  POST-OPERATIVE DIAGNOSIS:  Left Hip Fracture  PROCEDURE:  Procedure(s): CANNULATED HIP PINNING (Left)  SURGEON:  Surgeon(s) and Role:    * Kathryne Hitchhristopher Y Julea Hutto, MD - Primary  ANESTHESIA:   general  EBL:  Total I/O In: 700 [I.V.:700] Out: 50 [Blood:50]  COUNTS:  YES  TOURNIQUET:  * No tourniquets in log *  DICTATION: .Other Dictation: Dictation Number [id number]  PLAN OF CARE: Admit to inpatient   PATIENT DISPOSITION:  PACU - hemodynamically stable.   Delay start of Pharmacological VTE agent (>24hrs) due to surgical blood loss or risk of bleeding: no

## 2015-02-08 NOTE — Anesthesia Preprocedure Evaluation (Addendum)
Anesthesia Evaluation  Patient identified by MRN, date of birth, ID band Patient awake    Reviewed: Allergy & Precautions, NPO status , Patient's Chart, lab work & pertinent test results  Airway Mallampati: II   Neck ROM: full    Dental   Pulmonary Current Smoker,    breath sounds clear to auscultation       Cardiovascular hypertension, + CAD and + Peripheral Vascular Disease   Rhythm:regular Rate:Normal  Mild CAD found on cath in 1994.  Negative stress test.   Neuro/Psych PSYCHIATRIC DISORDERS Anxiety CVA    GI/Hepatic hiatal hernia, GERD  ,  Endo/Other    Renal/GU      Musculoskeletal  (+) Arthritis , Osteoarthritis,  Pt has chronic back and leg pain.  Spinal cord stimulator in place.   Abdominal   Peds  Hematology   Anesthesia Other Findings   Reproductive/Obstetrics                            Anesthesia Physical Anesthesia Plan  ASA: III  Anesthesia Plan: General   Post-op Pain Management:    Induction: Intravenous  Airway Management Planned: Oral ETT  Additional Equipment:   Intra-op Plan:   Post-operative Plan: Extubation in OR  Informed Consent: I have reviewed the patients History and Physical, chart, labs and discussed the procedure including the risks, benefits and alternatives for the proposed anesthesia with the patient or authorized representative who has indicated his/her understanding and acceptance.     Plan Discussed with: CRNA, Anesthesiologist and Surgeon  Anesthesia Plan Comments:         Anesthesia Quick Evaluation

## 2015-02-08 NOTE — Anesthesia Procedure Notes (Signed)
Procedure Name: Intubation Date/Time: 02/08/2015 8:04 AM Performed by: Thornell MuleSTUBBLEFIELD, Arika Mainer G Pre-anesthesia Checklist: Patient identified, Emergency Drugs available, Suction available and Patient being monitored Patient Re-evaluated:Patient Re-evaluated prior to inductionOxygen Delivery Method: Circle System Utilized Preoxygenation: Pre-oxygenation with 100% oxygen Intubation Type: IV induction Ventilation: Mask ventilation without difficulty Laryngoscope Size: Miller and 3 Grade View: Grade I Tube type: Oral Number of attempts: 1 Airway Equipment and Method: Stylet and Oral airway Placement Confirmation: ETT inserted through vocal cords under direct vision,  positive ETCO2 and breath sounds checked- equal and bilateral Secured at: 19 cm Tube secured with: Tape Dental Injury: Teeth and Oropharynx as per pre-operative assessment

## 2015-02-08 NOTE — Op Note (Signed)
Sara [name], Sara [name]               ACCOUNT NO.:  [id number]  MEDICAL RECORD NO.:  [id number]  LOCATION:  1617                         FACILITY:  [facility]  PHYSICIAN:  Vanita Panda. Magnus Ivan, M.D.DATE OF BIRTH:  [birth year]  DATE OF PROCEDURE:  02/08/2015 DATE OF DISCHARGE:                              OPERATIVE REPORT   PREOPERATIVE DIAGNOSIS:  Nondisplaced left hip femoral neck fracture.  POSTOPERATIVE DIAGNOSIS:  Nondisplaced left hip femoral neck fracture.  PROCEDURE:  Cannulated screw placed on the left hip.  IMPLANTS:  Biomet 6.5-mm partially threaded cannulated screws x3.  SURGEON:  Kathryne Hitch, MD.  ANESTHESIA:  General.  ANTIBIOTICS:  2 g IV Ancef.  BLOOD LOSS:  Less than 100 mL.  COMPLICATIONS:  None.  INDICATIONS:  [name] is a 74 year old who sustained a mechanical fall the day before yesterday landing on her left hip.  She had difficulty with ambulating and pain in her hip and she was seen in the [facility] Emergency Room and found to have a nondisplaced femoral neck fracture. Given the nondisplaced nature of this fracture and no previous knee arthritis in her hip, we elected to proceed with cannulated screw placement.  The risks and benefits of this were explained thoroughly including the risk of nonunion and developing avascular necrosis, which would lead to a hip replacement.  She understands this fully and understands that we will have her only partially weightbearing after this surgery.  PROCEDURE DESCRIPTION:  After informed consent was obtained, appropriate left hip was marked, she was brought to the operating room, and placed supine on the fracture table.  General anesthesia was then obtained. Her left leg was placed in in-line skeletal traction device but no traction was applied.  Perineal post was in place.  The left hip was abducted and placed in appropriate well leg holder with appropriate padding in the popliteal area.  We then  assessed the fracture under fluoroscopy and it remained nondisplaced.  We prepped the left hip with DuraPrep and sterile drapes.  Time-out was called to identify correct patient, correct left hip.  I then made an incision at the level of the lesser trochanter, carried this down just slightly distally and proximally.  Dissected down to the IT band and divided this.  I then placed 3 temporary guide pins under direct fluoroscopy in an inverted triangle format.  We took measurements off this and then placed 3 partially threaded cannulated screws, the superior posterior screw, and also placed a washer.  We then removed all guide pins, and put the hip through internal and external rotation and all the screws were in place. The femoral head and the fracture moved as a unit.  We then irrigated the wound with normal saline solution, closed the deep tissue with #1 Vicryl, followed by 2-0 Vicryl in subcutaneous tissue, and 4-0 Monocryl subcuticular stitch.  Steri-Strips and Aquacel dressing was applied.  She was taken off the fracture table, awakened, extubated, and taken to recovery room in stable condition.  All final counts were correct.  There were no complications noted.     Vanita Panda. Magnus Ivan, M.D.     CYB/MEDQ  D:  02/08/2015  T:  02/08/2015  Job:  [id number]

## 2015-02-08 NOTE — Care Management Note (Signed)
Case Management Note  Patient Details  Name: Sara [name] MRN: [id number] Date of Birth: 12/16/1940  Subjective/Objective:         Cannulated Hip Pinning         Action/Plan: NCM spoke to pt and gave permission to speak to dtr, Sara [name] #[name] [phone]. Dtr has HH list but [facility] a [facility]. Pt has RW, cane and  wheelchair and shower bench at home Waiting final recommendations for home.   Expected Discharge Date:  02/10/2015              Expected Discharge Plan:  Home w Home Health Services  In-House Referral:     Discharge planning Services  CM Consult  Post Acute Care Choice:  Home Health Choice offered to:  Patient, Adult Children  HH Arranged:  PT HH Agency:     Status of Service:  In process, will continue to follow  Medicare Important Message Given:    Date Medicare IM Given:    Medicare IM give by:    Date Additional Medicare IM Given:    Additional Medicare Important Message give by:     If discussed at Long Length of Stay Meetings, dates discussed:    Additional Comments:  Elliot CousinShavis, Djuna Frechette Ellen, RN 02/08/2015, 11:19 AM

## 2015-02-08 NOTE — Progress Notes (Signed)
[facility] PROGRESS NOTE  Sara [name]:[id number] DOB: 04-10-1941 DOA: 02/07/2015 PCP: Neena Rhymes, MD  HPI/Brief narrative 74 y.o. female with a hx of HTN, CAD, HTN, prior CVA who presented to ED with difficulty ambulating and L sided hip pain after mechanical fall the day prior. No LOC. In the ED, Pt noted to have nondisplaced L femoral neck fracture. Orthopedic surgery was consulted with recs for surgery on 10/15. Hospitalist consulted for admission.  Assessment/Plan: 1. L hip fracture 1. S/p mechanical fall 2. Orthopedic surgery was consulted and patient underwent surgery on 10/15. 3. Ortho recs to start pharmacologic DVT prophylaxis in >24hrs postop 4. PT/OT. Will likely need SNF on d/c 2. CAD 1. hx 3 vessel disease seen on 1994 cath , not needing stenting, with normal adenosine stress in 2009 2. Continues to deny chest pain or sob 3. Presenting EKG was unremarkable 4. Followed by Dr. Patty Sermons as outpatient 3. HTN 1. BP remains stable 2. Cont home regimen 4. Mild MVR 1. Noted on 2013 2d echo 5. HLD 1. Cont on statin per home regimen 6. Tobacco abuse 1. Cessation was done 2. Pt on nicotine patch 7. DVT prophylaxis 1. SCD's  Code Status: Full Family Communication: Pt in room Disposition Plan: Pending   Consultants:  Orthopedic Surgery  Procedures:  L hip surgery 10/15  Antibiotics: Anti-infectives    Start     Dose/Rate Route Frequency Ordered Stop   02/08/15 1400  ceFAZolin (ANCEF) IVPB 2 g/50 mL premix     2 g 100 mL/hr over 30 Minutes Intravenous Every 6 hours 02/08/15 1057 02/09/15 0159   02/08/15 0752  ceFAZolin (ANCEF) IVPB 2 g/50 mL premix     2 g 100 mL/hr over 30 Minutes Intravenous 60 min pre-op 02/08/15 0751 02/08/15 0806      HPI/Subjective: Denies sob or chest pain. Notes only minor residual post-op hip pain  Objective: Filed Vitals:   02/08/15 1000 02/08/15 1015 02/08/15 1131 02/08/15 1400  BP: 144/49 136/61 139/51  132/68  Pulse: 69 69 72 76  Temp:  98.9 F (37.2 C) 98.1 F (36.7 C) 98.2 F (36.8 C)  TempSrc:   Oral Oral  Resp: Height:      Weight:      SpO2: 97% 98% 99% 99%    Intake/Output Summary (Last 24 hours) at 02/08/15 1700 Last data filed at 02/08/15 1120  Gross per 24 hour  Intake   1435 ml  Output     50 ml  Net   1385 ml   Filed Weights   02/07/15 1224  Weight: 56.7 kg (125 lb)    Exam:   General:  Awake, laying in bed, in nad  Cardiovascular: regular, s1, s2  Respiratory: normal resp effort, no wheezing  Abdomen: soft,nondistended  Musculoskeletal: perfused, no clubbing, bandage over post-op site  Data Reviewed: Basic Metabolic Panel:  Recent Labs Lab 02/07/15 1436 02/08/15 0443  NA 142 140  K 4.1 4.0  CL 110 109  CO2 26 25  GLUCOSE 87 114*  BUN 20 18  CREATININE 0.60 0.64  CALCIUM 8.4* 8.5*   Liver Function Tests:  Recent Labs Lab 02/08/15 0443  AST 24  ALT 35  ALKPHOS 84  BILITOT 0.7  PROT 6.0*  ALBUMIN 2.7*   No results for input(s): LIPASE, AMYLASE in the last 168 hours. No results for input(s): AMMONIA in the last 168 hours. CBC:  Recent Labs Lab 02/07/15 1436 02/08/15 0443  WBC  9.3 8.9  NEUTROABS 6.3  --   HGB 12.3 11.5*  HCT 38.7 35.1*  MCV 95.6 93.4  PLT 289 246   Cardiac Enzymes: No results for input(s): CKTOTAL, CKMB, CKMBINDEX, TROPONINI in the last 168 hours. BNP (last 3 results) No results for input(s): BNP in the last 8760 hours.  ProBNP (last 3 results) No results for input(s): PROBNP in the last 8760 hours.  CBG: No results for input(s): GLUCAP in the last 168 hours.  Recent Results (from the past 240 hour(s))  Surgical pcr screen     Status: None   Collection Time: 02/07/15  8:26 PM  Result Value Ref Range Status   MRSA, PCR NEGATIVE NEGATIVE Final   Staphylococcus aureus NEGATIVE NEGATIVE Final    Comment:        The Xpert SA Assay (FDA approved for NASAL specimens in patients over 221  years of age), is one component of a comprehensive surveillance program.  Test performance has been validated by [facility] for patients greater than or equal to 68102 year old. It is not intended to diagnose infection nor to guide or monitor treatment.      Studies: Dg Chest 1 View  02/07/2015  CLINICAL DATA:  Fall today.  Preop for hip surgery. EXAM: CHEST  1 VIEW COMPARISON:  Two-view chest x-ray 04/14/2012. FINDINGS: Heart is mildly enlarged. Atherosclerotic changes are seen in the aortic arch. Lungs are clear. There is no edema or effusion to suggest failure. The visualized soft tissues and bony thorax are unremarkable. IMPRESSION: 1. Borderline cardiomegaly without failure. 2. No acute cardiopulmonary disease. Electronically Signed   By: Marin Robertshristopher  Mattern M.D.   On: 02/07/2015 15:13   Dg C-arm 1-60 Min-no Report  02/08/2015  CLINICAL DATA: surgery C-ARM 1-60 MINUTES Fluoroscopy was utilized by the requesting physician.  No radiographic interpretation.   Dg Hip Operative Unilat With Pelvis Left  02/08/2015  CLINICAL DATA:  Left hip pinning EXAM: OPERATIVE LEFT HIP (WITH PELVIS IF PERFORMED) 3 VIEWS TECHNIQUE: Fluoroscopic spot image(s) were submitted for interpretation post-operatively. FLUOROSCOPY TIME:  1 min 43 seconds COMPARISON:  Left hip radiographs dated 02/07/2015 FINDINGS: Intraoperative fluoroscopic radiographs during ORIF of a nondisplaced left femoral neck fracture in satisfactory position. IMPRESSION: Intraoperative fluoroscopic radiographs during ORIF of a left hip fracture, as above. Electronically Signed   By: Charline BillsSriyesh  Krishnan M.D.   On: 02/08/2015 09:56   Dg Hip Unilat With Pelvis 2-3 Views Left  02/07/2015  CLINICAL DATA:  Fall 1 day ago with left hip pain. EXAM: DG HIP (WITH OR WITHOUT PELVIS) 2-3V LEFT COMPARISON:  None. FINDINGS: There is a subtle nondisplaced left femoral neck fracture noted with slight valgus angulation. Slight impaction laterally. No  subluxation or dislocation. IMPRESSION: Subtle nondisplaced left femoral neck fracture with slight valgus angulation. Electronically Signed   By: Charlett NoseKevin  Dover M.D.   On: 02/07/2015 13:34    Scheduled Meds: . amLODipine  5 mg Oral Daily  . [START ON 02/09/2015] aspirin EC  325 mg Oral Q breakfast  . atorvastatin  20 mg Oral Daily  .  ceFAZolin (ANCEF) IV  2 g Intravenous Q6H  . gabapentin  600 mg Oral QHS  . HYDROmorphone      . HYDROmorphone      . metoprolol  25 mg Oral BID  . nicotine  14 mg Transdermal Daily  . omeprazole  20 mg Oral Daily   Continuous Infusions:   Principal Problem:   Hip fracture (HCC) Active Problems:  Mitral valve regurgitation   Hypertension   Hypercholesteremia   Coronary artery disease   Tobacco abuse   Closed left hip fracture (HCC)   Syrena Burges K  [facility] Pager [phone]. If 7PM-7AM, please contact night-coverage at [email or web address], password [facility] 02/08/2015, 5:00 PM  LOS: 1 day

## 2015-02-09 MED ORDER — HYDROCODONE-ACETAMINOPHEN 10-325 MG PO TABS: 1.0000 | ORAL_TABLET | Freq: Four times a day (QID) | ORAL | Status: DC | PRN

## 2015-02-09 MED ORDER — HYDROCODONE-ACETAMINOPHEN 5-325 MG PO TABS
1.0000 | ORAL_TABLET | ORAL | Status: DC | PRN
  Administered 2015-02-09 – 2015-02-10 (×6): 2 via ORAL
  Filled 2015-02-09 (×6): qty 2

## 2015-02-09 MED ORDER — ASPIRIN 325 MG PO TBEC: 325.0000 mg | DELAYED_RELEASE_TABLET | Freq: Every day | ORAL | Status: DC

## 2015-02-09 NOTE — Progress Notes (Signed)
Utilization review completed.  

## 2015-02-09 NOTE — Progress Notes (Signed)
[facility] PROGRESS NOTE  Sara [name] WUJ:[id number]:[id number] DOB: 11/02/1940 DOA: 02/07/2015 PCP: Neena RhymesKatherine Tabori, MD  HPI/Brief narrative 74 y.o. female with a hx of HTN, CAD, HTN, prior CVA who presented to ED with difficulty ambulating and L sided hip pain after mechanical fall the day prior. No LOC. In the ED, Pt noted to have nondisplaced L femoral neck fracture. Orthopedic surgery was consulted with recs for surgery on 10/15. Hospitalist consulted for admission.  Assessment/Plan: 1. L hip fracture 1. S/p mechanical fall 2. Orthopedic surgery was consulted and patient underwent surgery on 10/15. 3. Ortho recs to start pharmacologic DVT prophylaxis in >24hrs postop 4. PT/OT with recs for home health PT/OT 5. Patient continues with residual post-op pain still requiring IV narcotics 6. Will narrow frequency of Norco from q6prn dosing to q4hr prn 2. CAD 1. hx 3 vessel disease seen on 1994 cath , not needing stenting, with normal adenosine stress in 2009 2. Remains without chest pain or sob 3. Presenting EKG was unremarkable 4. Followed by Dr. Patty SermonsBrackbill as outpatient 3. HTN 1. BP remains stable 2. Cont home regimen 4. Mild MVR 1. Noted on 2013 2d echo 5. HLD 1. Cont on statin per home regimen 6. Tobacco abuse 1. Cessation was done 2. Pt on nicotine patch 7. DVT prophylaxis 1. SCD's while inpatient  Code Status: Full Family Communication: Pt in room Disposition Plan: Pending   Consultants:  Orthopedic Surgery  Procedures:  L hip surgery 10/15  Antibiotics: Anti-infectives    Start     Dose/Rate Route Frequency Ordered Stop   02/08/15 1400  ceFAZolin (ANCEF) IVPB 2 g/50 mL premix     2 g 100 mL/hr over 30 Minutes Intravenous Every 6 hours 02/08/15 1057 02/08/15 2020   02/08/15 0752  ceFAZolin (ANCEF) IVPB 2 g/50 mL premix     2 g 100 mL/hr over 30 Minutes Intravenous 60 min pre-op 02/08/15 0751 02/08/15 0806      HPI/Subjective: Patient complains of  continued post-operative L hip pain  Objective: Filed Vitals:   02/08/15 1400 02/08/15 2022 02/09/15 0214 02/09/15 0558  BP: 132/68 136/61 137/51 166/67  Pulse: 76 78 73 74  Temp: 98.2 F (36.8 C) 98.3 F (36.8 C) 98.3 F (36.8 C) 98.1 F (36.7 C)  TempSrc: Oral Oral Oral Oral  Resp: 16 16 16 18   Height:      Weight:      SpO2: 99% 95% 97% 97%    Intake/Output Summary (Last 24 hours) at 02/09/15 1412 Last data filed at 02/09/15 0900  Gross per 24 hour  Intake    400 ml  Output    350 ml  Net     50 ml   Filed Weights   02/07/15 1224  Weight: 56.7 kg (125 lb)    Exam:   General:  Awake, sitting in bed eating, in nad  Cardiovascular: regular, s1, s2  Respiratory: normal resp effort, no wheezing  Abdomen: soft,nondistended  Musculoskeletal: perfused, no clubbing, bandage over post-op site  Data Reviewed: Basic Metabolic Panel:  Recent Labs Lab 02/07/15 1436 02/08/15 0443  NA 142 140  K 4.1 4.0  CL 110 109  CO2 26 25  GLUCOSE 87 114*  BUN 20 18  CREATININE 0.60 0.64  CALCIUM 8.4* 8.5*   Liver Function Tests:  Recent Labs Lab 02/08/15 0443  AST 24  ALT 35  ALKPHOS 84  BILITOT 0.7  PROT 6.0*  ALBUMIN 2.7*   No results for input(s): LIPASE, AMYLASE  in the last 168 hours. No results for input(s): AMMONIA in the last 168 hours. CBC:  Recent Labs Lab 02/07/15 1436 02/08/15 0443  WBC 9.3 8.9  NEUTROABS 6.3  --   HGB 12.3 11.5*  HCT 38.7 35.1*  MCV 95.6 93.4  PLT 289 246   Cardiac Enzymes: No results for input(s): CKTOTAL, CKMB, CKMBINDEX, TROPONINI in the last 168 hours. BNP (last 3 results) No results for input(s): BNP in the last 8760 hours.  ProBNP (last 3 results) No results for input(s): PROBNP in the last 8760 hours.  CBG: No results for input(s): GLUCAP in the last 168 hours.  Recent Results (from the past 240 hour(s))  Surgical pcr screen     Status: None   Collection Time: 02/07/15  8:26 PM  Result Value Ref Range  Status   MRSA, PCR NEGATIVE NEGATIVE Final   Staphylococcus aureus NEGATIVE NEGATIVE Final    Comment:        The Xpert SA Assay (FDA approved for NASAL specimens in patients over 63 years of age), is one component of a comprehensive surveillance program.  Test performance has been validated by [facility] for patients greater than or equal to 31 year old. It is not intended to diagnose infection nor to guide or monitor treatment.      Studies: Dg Chest 1 View  02/07/2015  CLINICAL DATA:  Fall today.  Preop for hip surgery. EXAM: CHEST  1 VIEW COMPARISON:  Two-view chest x-ray 04/14/2012. FINDINGS: Heart is mildly enlarged. Atherosclerotic changes are seen in the aortic arch. Lungs are clear. There is no edema or effusion to suggest failure. The visualized soft tissues and bony thorax are unremarkable. IMPRESSION: 1. Borderline cardiomegaly without failure. 2. No acute cardiopulmonary disease. Electronically Signed   By: Marin Roberts M.D.   On: 02/07/2015 15:13   Dg C-arm 1-60 Min-no Report  02/08/2015  CLINICAL DATA: surgery C-ARM 1-60 MINUTES Fluoroscopy was utilized by the requesting physician.  No radiographic interpretation.   Dg Hip Operative Unilat With Pelvis Left  02/08/2015  CLINICAL DATA:  Left hip pinning EXAM: OPERATIVE LEFT HIP (WITH PELVIS IF PERFORMED) 3 VIEWS TECHNIQUE: Fluoroscopic spot image(s) were submitted for interpretation post-operatively. FLUOROSCOPY TIME:  1 min 43 seconds COMPARISON:  Left hip radiographs dated 02/07/2015 FINDINGS: Intraoperative fluoroscopic radiographs during ORIF of a nondisplaced left femoral neck fracture in satisfactory position. IMPRESSION: Intraoperative fluoroscopic radiographs during ORIF of a left hip fracture, as above. Electronically Signed   By: Charline Bills M.D.   On: 02/08/2015 09:56    Scheduled Meds: . amLODipine  5 mg Oral Daily  . aspirin EC  325 mg Oral Q breakfast  . atorvastatin  20 mg Oral Daily  .  gabapentin  600 mg Oral QHS  . metoprolol  25 mg Oral BID  . nicotine  14 mg Transdermal Daily  . omeprazole  20 mg Oral Daily   Continuous Infusions:   Principal Problem:   Hip fracture (HCC) Active Problems:   Mitral valve regurgitation   Hypertension   Hypercholesteremia   Coronary artery disease   Tobacco abuse   Closed left hip fracture (HCC)   Cindy Brindisi K  [facility] Pager [phone]. If 7PM-7AM, please contact night-coverage at [email or web address], password [facility] 02/09/2015, 2:12 PM  LOS: 2 days

## 2015-02-09 NOTE — Progress Notes (Signed)
Physical Therapy Treatment Patient Details Name: Sara [name] MRN: [id number] DOB: 05/26/1940 Today's Date: 02/09/2015    History of Present Illness s/p L cannulated hip pinning of L hip fx due to mechanical fall    PT Comments    Pt motivated and progressing well with mobility.  Follow Up Recommendations  Home health PT     Equipment Recommendations  None recommended by PT    Recommendations for Other Services OT consult     Precautions / Restrictions Precautions Precautions: Fall Restrictions Weight Bearing Restrictions: Yes LLE Weight Bearing: Partial weight bearing LLE Partial Weight Bearing Percentage or Pounds: 50%    Mobility  Bed Mobility Overal bed mobility: Needs Assistance Bed Mobility: Supine to Sit     Supine to sit: Min assist     General bed mobility comments: cues for sequence and use of R LE to self assist  Transfers Overall transfer level: Needs assistance Equipment used: Rolling walker (2 wheeled) Transfers: Sit to/from Stand Sit to Stand: Min assist;From elevated surface         General transfer comment: cues for hand placement, technique  Ambulation/Gait Ambulation/Gait assistance: Min assist Ambulation Distance (Feet): 63 Feet Assistive device: Rolling walker (2 wheeled) Gait Pattern/deviations: Step-to pattern;Decreased step length - right;Decreased step length - left;Shuffle;Trunk flexed Gait velocity: decr   General Gait Details: cues for sequence, posture and position from [employer]   Stairs            Wheelchair Mobility    Modified Rankin (Stroke Patients Only)       Balance                                    Cognition Arousal/Alertness: Awake/alert Behavior During Therapy: WFL for tasks assessed/performed Overall Cognitive Status: Within Functional Limits for tasks assessed                      Exercises      General Comments        Pertinent Vitals/Pain Pain Assessment:  0-10 Pain Score: 5  Pain Location: L hip Pain Descriptors / Indicators: Aching;Sore Pain Intervention(s): Limited activity within patient's tolerance;Monitored during session;Premedicated before session;Ice applied    Home Living                      Prior Function            PT Goals (current goals can now be found in the care plan section) Acute Rehab PT Goals Patient Stated Goal: to go home PT Goal Formulation: With patient Time For Goal Achievement: 02/15/15 Potential to Achieve Goals: Good Progress towards PT goals: Progressing toward goals    Frequency  7X/week    PT Plan Current plan remains appropriate    Co-evaluation             End of Session Equipment Utilized During Treatment: Gait belt Activity Tolerance: Patient tolerated treatment well Patient left: in bed;with call bell/phone within reach;with family/visitor present     Time: [id number] PT Time Calculation (min) (ACUTE ONLY): 15 min  Charges:  $Gait Training: 8-22 mins                    G Codes:      Sara [name] 02/09/2015, 4:50 PM

## 2015-02-09 NOTE — Progress Notes (Signed)
Subjective: 1 Day Post-Op Procedure(s) (LRB): CANNULATED HIP PINNING (Left) Patient reports pain as mild.    Objective: Vital signs in last 24 hours: Temp:  [98.1 F (36.7 C)-98.9 F (37.2 C)] 98.1 F (36.7 C) (10/16 0558) Pulse Rate:  [69-78] 74 (10/16 0558) Resp:  [11-18] 18 (10/16 0558) BP: (132-175)/(42-68) 166/67 mmHg (10/16 0558) SpO2:  [95 %-100 %] 97 % (10/16 0558)  Intake/Output from previous day: 10/15 0701 - 10/16 0700 In: 1115 [P.O.:60; I.V.:900; IV Piggyback:155] Out: 50 [Blood:50] Intake/Output this shift:     Recent Labs  02/07/15 1436 02/08/15 0443  HGB 12.3 11.5*    Recent Labs  02/07/15 1436 02/08/15 0443  WBC 9.3 8.9  RBC 4.05 3.76*  HCT 38.7 35.1*  PLT 289 246    Recent Labs  02/07/15 1436 02/08/15 0443  NA 142 140  K 4.1 4.0  CL 110 109  CO2 26 25  BUN 20 18  CREATININE 0.60 0.64  GLUCOSE 87 114*  CALCIUM 8.4* 8.5*    Recent Labs  02/07/15 1436  INR 0.96    Sensation intact distally Intact pulses distally Dorsiflexion/Plantar flexion intact Incision: dressing C/D/I  Assessment/Plan: 1 Day Post-Op Procedure(s) (LRB): CANNULATED HIP PINNING (Left) Up with therapy - 50% weight on left hip/leg only for next 4 weeks. Discharge when clears therapy.  [place]orne Day Y 02/09/2015, 9:03 AM

## 2015-02-09 NOTE — Evaluation (Addendum)
Physical Therapy Evaluation Patient Details Name: Sara [name] MRN: [id number] DOB: 05/25/1940 Today's Date: 02/09/2015   History of Present Illness  s/p L cannulated hip pinning of L hip fx due to mechanical fall  Clinical Impression  Pt s/p L hip fx presents with decreased L LE strength/ROM, post op pain and PWB limiting functional mobility.  Pt very motivated to return home with family.  Pt's dtr present and states that 24/7 assist will be available at time of dc.  Pt would benefit from follow up HHPT.    Follow Up Recommendations Home health PT    Equipment Recommendations  None recommended by PT    Recommendations for Other Services OT consult     Precautions / Restrictions Precautions Precautions: Fall Restrictions Weight Bearing Restrictions: Yes LLE Weight Bearing: Partial weight bearing LLE Partial Weight Bearing Percentage or Pounds: 50%      Mobility  Bed Mobility Overal bed mobility: Needs Assistance Bed Mobility: Supine to Sit     Supine to sit: Min assist (increased time)        Transfers Overall transfer level: Needs assistance Equipment used: Rolling walker (2 wheeled) Transfers: Sit to/from Stand Sit to Stand: Min assist;From elevated surface         General transfer comment: cues for hand placement, technique  Ambulation/Gait Ambulation/Gait assistance: Min assist;Mod assist Ambulation Distance (Feet): 30 Feet Assistive device: Rolling walker (2 wheeled) Gait Pattern/deviations: Step-to pattern;Decreased step length - right;Decreased step length - left;Shuffle;Trunk flexed Gait velocity: decr   General Gait Details: cues for sequence, posture and position from [employer]  Stairs            Wheelchair Mobility    Modified Rankin (Stroke Patients Only)       Balance                                             Pertinent Vitals/Pain Pain Assessment: 0-10 Pain Score: 5  Pain Location: L hip Pain Descriptors /  Indicators: Aching;Sore Pain Intervention(s): Limited activity within patient's tolerance;Monitored during session;Premedicated before session;Ice applied    Home Living Family/patient expects to be discharged to:: Private residence Living Arrangements: Spouse/significant other Available Help at Discharge: Family;Available 24 hours/day Type of Home: House Home Access: Stairs to enter1+1+1      Home Layout: One level Home Equipment: Environmental consultantWalker - 2 wheels;Walker - 4 wheels;Shower seat;Hand held shower head      Prior Function Level of Independence: Independent               Hand Dominance   Dominant Hand: Right    Extremity/Trunk Assessment   Upper Extremity Assessment: Overall WFL for tasks assessed           Lower Extremity Assessment: LLE deficits/detail   LLE Deficits / Details: 2+/5 strength at hip with AAROM at hip to 80 flex and 15 abd  Cervical / Trunk Assessment: Normal  Communication   Communication: No difficulties  Cognition Arousal/Alertness: Awake/alert Behavior During Therapy: WFL for tasks assessed/performed Overall Cognitive Status: Within Functional Limits for tasks assessed                      General Comments      Exercises General Exercises - Lower Extremity Ankle Circles/Pumps: AROM;Both;15 reps;Supine Quad Sets: AROM;Both;10 reps;Supine Heel Slides: AAROM;15 reps;Supine;Left Hip ABduction/ADduction: AAROM;Left;10 reps;Supine  Assessment/Plan    PT Assessment Patient needs continued PT services  PT Diagnosis Difficulty walking   PT Problem List Decreased strength;Decreased range of motion;Decreased activity tolerance;Decreased mobility;Pain;Decreased knowledge of use of DME  PT Treatment Interventions DME instruction;Gait training;Stair training;Functional mobility training;Therapeutic activities;Therapeutic exercise;Patient/family education   PT Goals (Current goals can be found in the Care Plan section) Acute Rehab PT  Goals Patient Stated Goal: to go home PT Goal Formulation: With patient Time For Goal Achievement: 02/15/15 Potential to Achieve Goals: Good    Frequency 7X/week   Barriers to discharge        Co-evaluation   Reason for Co-Treatment: Complexity of the patient's impairments (multi-system involvement);For patient/therapist safety PT goals addressed during session: Mobility/safety with mobility OT goals addressed during session: ADL's and self-care       End of Session Equipment Utilized During Treatment: Gait belt Activity Tolerance: Patient tolerated treatment well Patient left: in chair;with call bell/phone within reach;with family/visitor present Nurse Communication: Mobility status         Time: 6045-4098 PT Time Calculation (min) (ACUTE ONLY): 31 min   Charges:   PT Evaluation $Initial PT Evaluation Tier I: 1 Procedure     PT G Codes:        Hannia Matchett Feb 11, 2015, 12:56 PM

## 2015-02-09 NOTE — Discharge Instructions (Signed)
Can get your dressing wet daily in the shower. Leave your current dressing in place until your outpatient Orthopedic follow-up in 2 weeks. Only 50% of your weight on your left hip until further notice.

## 2015-02-09 NOTE — Progress Notes (Signed)
Occupational Therapy Evaluation Patient Details Name: KARLEY PHO MRN: [id number] DOB: [birth year] Today's Date: 02/09/2015    History of Present Illness s/p L cannulated hip pinning of L hip fx due to mechanical fall   Clinical Impression   Patient presents to OT with decreased ADL independence and safety due to the functional deficits listed below. She will benefit from skilled OT to maximize independence and to facilitate a safe discharge plan. OT will follow.    Follow Up Recommendations  Home health OT;Supervision/Assistance - 24 hour    Equipment Recommendations  3 in 1 bedside comode    Recommendations for Other Services PT consult     Precautions / Restrictions Precautions Precautions: Fall Restrictions Weight Bearing Restrictions: Yes LLE Weight Bearing: Partial weight bearing LLE Partial Weight Bearing Percentage or Pounds: 50      Mobility Bed Mobility Overal bed mobility: Needs Assistance Bed Mobility: Supine to Sit     Supine to sit: Min assist (increased time)        Transfers Overall transfer level: Needs assistance Equipment used: Rolling walker (2 wheeled) Transfers: Sit to/from Stand Sit to Stand: Min assist;From elevated surface         General transfer comment: cues for hand placement, technique    Balance                                            ADL Overall ADL's : Needs assistance/impaired Eating/Feeding: Set up;Sitting   Grooming: Brushing hair;Set up;Sitting   Upper Body Bathing: Set up;Sitting   Lower Body Bathing: Moderate assistance;Sit to/from stand;Maximal assistance   Upper Body Dressing : Minimal assistance;Sitting   Lower Body Dressing: Maximal assistance;Sit to/from stand   Toilet Transfer: Minimal assistance;BSC           Functional mobility during ADLs: Minimal assistance;Rolling walker;+2 for safety/equipment General ADL Comments: Patient educated on PWB status for mobility. She  did very well with maintaining PWB with cues for ambulation sequence. From a seated position she can perform UB ADLs, grooming, feeding. OT will go over LB self-care next session, as well as educate on walk-in shower transfer technique. Patient has been getting up with nursing to [facility] Recommend 3 in 1 for home.      Vision     Perception     Praxis      Pertinent Vitals/Pain Pain Assessment: 0-10 Pain Score: 5  Pain Location: L hip Pain Descriptors / Indicators: Aching;Sore Pain Intervention(s): Limited activity within patient's tolerance;Monitored during session;Premedicated before session     Hand Dominance Right   Extremity/Trunk Assessment Upper Extremity Assessment Upper Extremity Assessment: Overall WFL for tasks assessed   Lower Extremity Assessment Lower Extremity Assessment: Defer to PT evaluation       Communication Communication Communication: No difficulties   Cognition Arousal/Alertness: Awake/alert Behavior During Therapy: WFL for tasks assessed/performed Overall Cognitive Status: Within Functional Limits for tasks assessed                     General Comments       Exercises       Shoulder Instructions      Home Living Family/patient expects to be discharged to:: Private residence Living Arrangements: Spouse/significant other Available Help at Discharge: Family;Available 24 hours/day Type of Home: House       Home Layout: One level     Bathroom  Shower/Tub: Producer, television/film/videoWalk-in shower   Bathroom Toilet: Standard     Home Equipment: Environmental consultantWalker - 2 wheels;Walker - 4 wheels;Shower seat;Hand held shower head          Prior Functioning/Environment Level of Independence: Independent             OT Diagnosis: Acute pain   OT Problem List: Decreased strength;Decreased activity tolerance;Decreased knowledge of use of DME or AE;Decreased knowledge of precautions;Pain   OT Treatment/Interventions: Self-care/ADL training;Therapeutic exercise;DME  and/or AE instruction;Therapeutic activities;Patient/family education    OT Goals(Current goals can be found in the care plan section) Acute Rehab OT Goals Patient Stated Goal: to go home OT Goal Formulation: With patient Time For Goal Achievement: 02/23/15 Potential to Achieve Goals: Good  OT Frequency: Min 2X/week   Barriers to D/C:            Co-evaluation PT/OT/SLP Co-Evaluation/Treatment: Yes Reason for Co-Treatment: Complexity of the patient's impairments (multi-system involvement);For patient/therapist safety PT goals addressed during session: Mobility/safety with mobility OT goals addressed during session: ADL's and self-care      End of Session Equipment Utilized During Treatment: Rolling walker Nurse Communication: Mobility status  Activity Tolerance: Patient tolerated treatment well Patient left: in chair;with call bell/phone within reach;with family/visitor present   Time: [id number] OT Time Calculation (min): 16 min Charges:  OT General Charges $OT Visit: 1 Procedure OT Evaluation $Initial OT Evaluation Tier I: 1 Procedure G-Codes:    [place] Alfredo A 02/09/2015, 12:12 PM

## 2015-02-09 NOTE — Clinical Social Work Note (Signed)
Clinical Social Work Assessment  Patient Details  Name: Sara [name] MRN: [id number] Date of Birth: 16-Sep-1940  Date of referral:  02/09/15               Reason for consult:  Facility Placement                Permission sought to share information with:  Chartered certified accountant granted to share information::  Yes, Verbal Permission Granted  Name::        Agency::   (SNFs pending PT recommendations as patient's first choice is to return home)  Relationship::     Contact Information:     Housing/Transportation Living arrangements for the past 2 months:  Single Family Home Source of Information:  Patient Patient Interpreter Needed:  None Criminal Activity/Legal Involvement Pertinent to Current Situation/Hospitalization:  No - Comment as needed Significant Relationships:    Lives with:  Spouse Do you feel safe going back to the place where you live?  Yes Need for family participation in patient care:  No (Coment)  Care giving concerns:  No caregiver present at time of this assessment   Social Worker assessment / plan:  CSW met with patient at bedside to review disposition plans.  Of note, PT has not evaluated patient's needs and determination for needed level of care is not yet available.  SNF is anticipated by medical staff per consult placed to CSW.  Patient states that her goal is to return home with husband, but is agreeable to SNF placement if PT thinks "it is best".  Patient states she is agreeable to home health therapy if this is an option.  CSW educated patient on need for PT to assist with this determination.  Patient was advised that she would work with therapy today.  CSW will continue to follow and assist with discharge needs.    Employment status:  Retired Nurse, adult PT Recommendations:  Not assessed at this time Information / Referral to community resources:     Patient/Family's Response to care:  Patient's first choice  is home with home health therapy, but is agreeable to SNF if PT determines "it is best"  Patient/Family's Understanding of and Emotional Response to Diagnosis, Current Treatment, and Prognosis:  Patient is realistic regarding her quality of life and anticipated need for therapy at time of discharge.  Emotional Assessment Appearance:  Appears stated age Attitude/Demeanor/Rapport:   (appropriate) Affect (typically observed):  Calm, Stable, Accepting, Adaptable, Pleasant, Hopeful, Appropriate Orientation:  Oriented to Self, Oriented to Place, Oriented to  Time, Oriented to Situation Alcohol / Substance use:  Not Applicable Psych involvement (Current and /or in the community):  No (Comment)  Discharge Needs  Concerns to be addressed:  No discharge needs identified Readmission within the last 30 days:  No Current discharge risk:  None Barriers to Discharge:  Continued Medical Work up   [employer], LCSW 02/09/2015, 10:47 AM

## 2015-02-10 ENCOUNTER — Encounter (HOSPITAL_COMMUNITY): Payer: Self-pay | Admitting: Orthopaedic Surgery

## 2015-02-10 NOTE — Progress Notes (Signed)
Doing well.  Dressing left hip clean and dry.  Can be discharged to home from ortho standpoint with home health.

## 2015-02-10 NOTE — Discharge Summary (Signed)
Patient ID: TAYLON LOUISON MRN: [id number] DOB/AGE: 1940/12/28 74 y.o.  Admit date: 02/07/2015 Discharge date: 02/10/2015  Admission Diagnoses:  Principal Problem:   Hip fracture [facility]) Active Problems:   Mitral valve regurgitation   Hypertension   Hypercholesteremia   Coronary artery disease   Tobacco abuse   Closed left hip fracture [facility])   Discharge Diagnoses:  Same  Past Medical History  Diagnosis Date  . Mitral valve regurgitation     trace  . Hypercholesteremia   . Arthritis   . H/O hiatal hernia   . Stroke (HCC)   . Coronary artery disease     sees Dr. Patty Sermons  . Hypertension     all managed by Dr. Haynes Dage bill  . Anxiety   . Bronchitis     hx of  . GERD (gastroesophageal reflux disease)     hx of  . Carotid artery occlusion     Surgeries: Procedure(s): CANNULATED HIP PINNING on 02/07/2015 - 02/08/2015   Consultants: Treatment Team:  Kathryne Hitch, MD  Discharged Condition: Improved  Hospital Course: ROTHA CASSELS is an 74 y.o. female who was admitted 02/07/2015 for operative treatment ofHip fracture (HCC). Patient has severe unremitting pain that affects sleep, daily activities, and work/hobbies. After pre-op clearance the patient was taken to the operating room on 02/07/2015 - 02/08/2015 and underwent  Procedure(s): CANNULATED HIP PINNING.    Patient was given perioperative antibiotics: Anti-infectives    Start     Dose/Rate Route Frequency Ordered Stop   02/08/15 1400  ceFAZolin (ANCEF) IVPB 2 g/50 mL premix     2 g 100 mL/hr over 30 Minutes Intravenous Every 6 hours 02/08/15 1057 02/08/15 2020   02/08/15 0752  ceFAZolin (ANCEF) IVPB 2 g/50 mL premix     2 g 100 mL/hr over 30 Minutes Intravenous 60 min pre-op 02/08/15 0751 02/08/15 0806       Patient was given sequential compression devices, early ambulation, and chemoprophylaxis to prevent DVT.  Patient benefited maximally from hospital stay and there were no complications.     Recent vital signs: Patient Vitals for the past 24 hrs:  BP Temp Temp src Pulse Resp SpO2  02/10/15 0905 - - - 72 - -  02/10/15 0859 (!) 154/60 mmHg - - - - -  02/10/15 0507 130/60 mmHg 98.1 F (36.7 C) Oral 77 14 97 %  02/09/15 2026 (!) 145/62 mmHg 98 F (36.7 C) Oral 84 16 100 %  02/09/15 1400 (!) 150/59 mmHg 98.4 F (36.9 C) Tympanic 74 16 98 %     Recent laboratory studies:  Recent Labs  02/07/15 1436 02/08/15 0443  WBC 9.3 8.9  HGB 12.3 11.5*  HCT 38.7 35.1*  PLT 289 246  NA 142 140  K 4.1 4.0  CL 110 109  CO2 26 25  BUN 20 18  CREATININE 0.60 0.64  GLUCOSE 87 114*  INR 0.96  --   CALCIUM 8.4* 8.5*     Discharge Medications:     Medication List    STOP taking these medications        aspirin 81 MG tablet  Replaced by:  aspirin 325 MG EC tablet     sertraline 25 MG tablet  Commonly known as:  ZOLOFT     valsartan 320 MG tablet  Commonly known as:  DIOVAN      TAKE these medications        amLODipine 10 MG tablet  Commonly known as:  NORVASC  TAKE 1 TABLET (10 MG TOTAL) BY MOUTH DAILY.     aspirin 325 MG EC tablet  Take 1 tablet (325 mg total) by mouth daily with breakfast.     atorvastatin 40 MG tablet  Commonly known as:  LIPITOR  TAKE 1/2 TABLET BY MOUTH DAILY     diazepam 5 MG tablet  Commonly known as:  VALIUM  TAKE 1 TABLET BY MOUTH EVERY DAY AS NEEDED FOR ANXIETY     gabapentin 300 MG capsule  Commonly known as:  NEURONTIN  300-600 mg. take 2 tablets every night, and take 1 tablet as needed in the morning     HYDROcodone-acetaminophen 10-325 MG tablet  Commonly known as:  NORCO  Take 1 tablet by mouth every 6 (six) hours as needed for moderate pain (for pain).     metoprolol 50 MG tablet  Commonly known as:  LOPRESSOR  TAKE 1/2 TABLET BY MOUTH TWICE A DAY     nitroGLYCERIN 0.4 MG SL tablet  Commonly known as:  NITROSTAT  Place 0.4 mg under the tongue every 5 (five) minutes as needed (for chest pain).     omeprazole 20 MG  tablet  Commonly known as:  PRILOSEC OTC  Take 20 mg by mouth daily.        Diagnostic Studies: Dg Chest 1 View  02/07/2015  CLINICAL DATA:  Fall today.  Preop for hip surgery. EXAM: CHEST  1 VIEW COMPARISON:  Two-view chest x-ray 04/14/2012. FINDINGS: Heart is mildly enlarged. Atherosclerotic changes are seen in the aortic arch. Lungs are clear. There is no edema or effusion to suggest failure. The visualized soft tissues and bony thorax are unremarkable. IMPRESSION: 1. Borderline cardiomegaly without failure. 2. No acute cardiopulmonary disease. Electronically Signed   By: Marin Roberts M.D.   On: 02/07/2015 15:13   Dg C-arm 1-60 Min-no Report  02/08/2015  CLINICAL DATA: surgery C-ARM 1-60 MINUTES Fluoroscopy was utilized by the requesting physician.  No radiographic interpretation.   Dg Hip Operative Unilat With Pelvis Left  02/08/2015  CLINICAL DATA:  Left hip pinning EXAM: OPERATIVE LEFT HIP (WITH PELVIS IF PERFORMED) 3 VIEWS TECHNIQUE: Fluoroscopic spot image(s) were submitted for interpretation post-operatively. FLUOROSCOPY TIME:  1 min 43 seconds COMPARISON:  Left hip radiographs dated 02/07/2015 FINDINGS: Intraoperative fluoroscopic radiographs during ORIF of a nondisplaced left femoral neck fracture in satisfactory position. IMPRESSION: Intraoperative fluoroscopic radiographs during ORIF of a left hip fracture, as above. Electronically Signed   By: Charline Bills M.D.   On: 02/08/2015 09:56   Dg Hip Unilat With Pelvis 2-3 Views Left  02/07/2015  CLINICAL DATA:  Fall 1 day ago with left hip pain. EXAM: DG HIP (WITH OR WITHOUT PELVIS) 2-3V LEFT COMPARISON:  None. FINDINGS: There is a subtle nondisplaced left femoral neck fracture noted with slight valgus angulation. Slight impaction laterally. No subluxation or dislocation. IMPRESSION: Subtle nondisplaced left femoral neck fracture with slight valgus angulation. Electronically Signed   By: Charlett Nose M.D.   On: 02/07/2015  13:34    Disposition: 01-Home or Self Care      Discharge Instructions    Partial weight bearing    Complete by:  As directed   % Body Weight:  50%  Laterality:  left  Extremity:  Lower           Follow-up Information    Follow up with Kathryne Hitch, MD. Schedule an appointment as soon as possible for a visit in 2 weeks.   Specialty:  Orthopedic  Surgery   Contact information:   [address] [place] [address] [phone]       Follow up with Neena RhymesKatherine Tabori, MD. Schedule an appointment as soon as possible for a visit in 1 week.   Specialty:  Family Medicine   Why:  Hospital follow up   Contact information:   [address] [place] [address] [phone]       Follow up with Kathryne HitchBLACKMAN,Ruthanna Macchia Y, MD. Schedule an appointment as soon as possible for a visit in 2 weeks.   Specialty:  Orthopedic Surgery   Contact information:   [address] [place] [address] [phone]       [facility]   Contact information:   [address] [place] [address] [phone]        Signed: Kathryne HitchBLACKMAN,[place] Y 02/10/2015, 1:21 PM

## 2015-02-10 NOTE — Care Management Note (Signed)
Case Management Note  Patient Details  Name: Sara [name] MRN: [id number] Date of Birth: 04/26/1940  Subjective/Objective:   Nondisplaced left hip femoral neck fracture, Cannulated screw placed on the left hip                 Action/Plan: Discharge planning, spoke with patient at bedside. Eager to d/c home today. [facility], has all needed DME at home. [facility]   Expected Discharge Date:   (unknown)               Expected Discharge Plan:  Home w Home Health Services  In-House Referral:  NA  Discharge planning Services  CM Consult  Post Acute Care Choice:  Home Health Choice offered to:  Patient  DME Arranged:  N/A DME Agency:  NA  HH Arranged:  PT HH Agency[facility]  [facility]  Status of Service:  Completed, signed off  Medicare Important Message Given:    Date Medicare IM Given:    Medicare IM give by:    Date Additional Medicare IM Given:    Additional Medicare Important Message give by:     If discussed at Long Length of Stay Meetings, dates discussed:    Additional Comments:  Alexis Goodelleele, Kelci Petrella K, RN 02/10/2015, 10:36 AM

## 2015-02-10 NOTE — Progress Notes (Signed)
[facility] PROGRESS NOTE  Sara [name] ZOX:[id number] DOB: 09/13/1940 DOA: 02/07/2015 PCP: Neena RhymesKatherine Tabori, MD  HPI/Brief narrative 74 y.o. female with a hx of HTN, CAD, HTN, prior CVA who presented to ED with difficulty ambulating and L sided hip pain after mechanical fall the day prior. No LOC. In the ED, Pt noted to have nondisplaced L femoral neck fracture. Orthopedic surgery was consulted with recs for surgery on 10/15. Hospitalist consulted for admission.  Assessment/Plan: 1. L hip fracture 1. S/p mechanical fall 2. Orthopedic surgery was consulted and patient underwent surgery on 10/15. 3. PT/OT with recs for home health PT/OT 4. Continued on Norco for pain mgt 5. Cleared for d/c today per Orthopedic Surgery 2. CAD 1. hx 3 vessel disease seen on 1994 cath , not needing stenting, with normal adenosine stress in 2009 2. Remains without chest pain or sob 3. Presenting EKG was unremarkable 4. Followed by Dr. Patty SermonsBrackbill as outpatient 3. HTN 1. BP remains stable 2. Cont home regimen 4. Mild MVR 1. Noted on 2013 2d echo 5. HLD 1. Cont on statin per home regimen 6. Tobacco abuse 1. Cessation was done 2. Pt on nicotine patch 7. DVT prophylaxis 1. SCD's while inpatient  Code Status: Full Family Communication: Pt in room Disposition Plan: discharge today to home with home health PT/OT   Consultants:  Orthopedic Surgery  Procedures:  L hip surgery 10/15  Antibiotics: Anti-infectives    Start     Dose/Rate Route Frequency Ordered Stop   02/08/15 1400  ceFAZolin (ANCEF) IVPB 2 g/50 mL premix     2 g 100 mL/hr over 30 Minutes Intravenous Every 6 hours 02/08/15 1057 02/08/15 2020   02/08/15 0752  ceFAZolin (ANCEF) IVPB 2 g/50 mL premix     2 g 100 mL/hr over 30 Minutes Intravenous 60 min pre-op 02/08/15 0751 02/08/15 0806      HPI/Subjective: Eager to go home  Objective: Filed Vitals:   02/09/15 2026 02/10/15 0507 02/10/15 0859 02/10/15 0905  BP:  145/62 130/60 154/60   Pulse: 84 77  72  Temp: 98 F (36.7 C) 98.1 F (36.7 C)    TempSrc: Oral Oral    Resp: 16 14    Height:      Weight:      SpO2: 100% 97%      Intake/Output Summary (Last 24 hours) at 02/10/15 1748 Last data filed at 02/10/15 0849  Gross per 24 hour  Intake    480 ml  Output    150 ml  Net    330 ml   Filed Weights   02/07/15 1224  Weight: 56.7 kg (125 lb)    Exam:   General:  Awake, laying in bed, in nad  Cardiovascular: regular, s1, s2  Respiratory: normal resp effort, no wheezing  Abdomen: soft,nondistended, pos BS  Musculoskeletal: perfused, no clubbing, bandage over post-op site  Data Reviewed: Basic Metabolic Panel:  Recent Labs Lab 02/07/15 1436 02/08/15 0443  NA 142 140  [name] 4.1 4.0  CL 110 109  CO2 26 25  GLUCOSE 87 114*  BUN 20 18  CREATININE 0.60 0.64  CALCIUM 8.4* 8.5*   Liver Function Tests:  Recent Labs Lab 02/08/15 0443  AST 24  ALT 35  ALKPHOS 84  BILITOT 0.7  PROT 6.0*  ALBUMIN 2.7*   No results for input(s): LIPASE, AMYLASE in the last 168 hours. No results for input(s): AMMONIA in the last 168 hours. CBC:  Recent Labs Lab 02/07/15 1436  02/08/15 0443  WBC 9.3 8.9  NEUTROABS 6.3  --   HGB 12.3 11.5*  HCT 38.7 35.1*  MCV 95.6 93.4  PLT 289 246   Cardiac Enzymes: No results for input(s): CKTOTAL, CKMB, CKMBINDEX, TROPONINI in the last 168 hours. BNP (last 3 results) No results for input(s): BNP in the last 8760 hours.  ProBNP (last 3 results) No results for input(s): PROBNP in the last 8760 hours.  CBG: No results for input(s): GLUCAP in the last 168 hours.  Recent Results (from the past 240 hour(s))  Surgical pcr screen     Status: None   Collection Time: 02/07/15  8:26 PM  Result Value Ref Range Status   MRSA, PCR NEGATIVE NEGATIVE Final   Staphylococcus aureus NEGATIVE NEGATIVE Final    Comment:        The Xpert SA Assay (FDA approved for NASAL specimens in patients over 21 years  of age), is one component of a comprehensive surveillance program.  Test performance has been validated by [facility] for patients greater than or equal to 57 year old. It is not intended to diagnose infection nor to guide or monitor treatment.      Studies: No results found.  Scheduled Meds:  Continuous Infusions:  Principal Problem:   Hip fracture (HCC) Active Problems:   Mitral valve regurgitation   Hypertension   Hypercholesteremia   Coronary artery disease   Tobacco abuse   Closed left hip fracture (HCC)   Sara [name]  [facility] Pager [phone]. If 7PM-7AM, please contact night-coverage at [email or web address], password [facility] 02/10/2015, 5:48 PM  LOS: 3 days

## 2015-02-10 NOTE — Progress Notes (Signed)
Occupational Therapy Treatment Patient Details Name: Denton BrickRebecca S Tiley MRN: [id number] DOB: 03/05/1941 Today's Date: 02/10/2015    History of present illness s/p L cannulated hip pinning of L hip fx due to mechanical fall   OT comments  Pts family will A as needed.    Follow Up Recommendations  Home health OT;Supervision/Assistance - 24 hour    Equipment Recommendations  3 in 1 bedside comode       Precautions / Restrictions Precautions Precautions: Fall Restrictions Weight Bearing Restrictions: Yes LLE Weight Bearing: Partial weight bearing LLE Partial Weight Bearing Percentage or Pounds: 50%       Mobility Bed Mobility Overal bed mobility: Needs Assistance Bed Mobility: Supine to Sit     Supine to sit: Min assist        Transfers Overall transfer level: Needs assistance Equipment used: Rolling walker (2 wheeled) Transfers: Sit to/from Stand Sit to Stand: Min assist         General transfer comment: cues for hand placement, technique        ADL                       Lower Body Dressing: Sit to/from stand;Cueing for sequencing;Cueing for safety;Minimal assistance   Toilet Transfer: RW;Min guard;BSC             General ADL Comments: Patient educated on PWB status for mobility. She did very well with maintaining PWB with cues for ambulation sequence.  Family will A as needed.                Cognition   Behavior During Therapy: WFL for tasks assessed/performed Overall Cognitive Status: Within Functional Limits for tasks assessed                               General Comments      Pertinent Vitals/ Pain       Pain Location: L hip Pain Descriptors / Indicators: Sore Pain Intervention(s): Limited activity within patient's tolerance;Monitored during session         Frequency Min 2X/week     Progress Toward Goals  OT Goals(current goals can now be found in the care plan section)    progressing     Plan Discharge  plan remains appropriate       End of Session Equipment Utilized During Treatment: Rolling walker   Activity Tolerance Patient tolerated treatment well   Patient Left in chair;with call bell/phone within reach;with family/visitor present   Nurse Communication          Time: [id number] OT Time Calculation (min): 8 min  Charges: OT General Charges $OT Visit: 1 Procedure OT Treatments $Self Care/Home Management : 8-22 mins  Coty Larsh, Karin GoldenLorraine D 02/10/2015, 12:10 PM

## 2015-02-10 NOTE — Progress Notes (Signed)
Physical Therapy Treatment Patient Details Name: Sara [name] MRN: [id number] DOB: 05/25/1940 Today's Date: 02/10/2015    History of Present Illness s/p L cannulated hip pinning of L hip fx due to mechanical fall    PT Comments    Assisted OOB to bathroom then amb in hallway a limited distance. Pt aware she is PWB.  Practiced one step twice, up [facility]  Returned to room and performed some hip TE's followed by ICE.     Follow Up Recommendations  Home health PT     Equipment Recommendations       Recommendations for Other Services       Precautions / Restrictions Precautions Precautions: Fall Restrictions Weight Bearing Restrictions: Yes LLE Weight Bearing: Partial weight bearing LLE Partial Weight Bearing Percentage or Pounds: 50%    Mobility  Bed Mobility Overal bed mobility: Needs Assistance Bed Mobility: Supine to Sit     Supine to sit: Min guard     General bed mobility comments: cues for sequence and use of R LE to self assist  Transfers Overall transfer level: Needs assistance Equipment used: Rolling walker (2 wheeled) Transfers: Sit to/from Stand Sit to Stand: Supervision         General transfer comment: cues for hand placement, technique  Ambulation/Gait Ambulation/Gait assistance: Supervision;Min guard Ambulation Distance (Feet): 45 Feet Assistive device: Rolling walker (2 wheeled) Gait Pattern/deviations: Step-to pattern;Decreased stance time - left Gait velocity: decr   General Gait Details: cues for sequence, posture and position from RW   Stairs Stairs: Yes Stairs assistance: Min assist Stair Management: No rails;Step to pattern;Backwards Number of Stairs: 1 General stair comments: performed twice.  Up backward due to PWB.  50% VC's on proper sequencing and safety.   Wheelchair Mobility    Modified Rankin (Stroke Patients Only)       Balance                                    Cognition  Arousal/Alertness: Awake/alert Behavior During Therapy: WFL for tasks assessed/performed Overall Cognitive Status: Within Functional Limits for tasks assessed                      Exercises   Surgical  Hip TE's 10 reps ankle pumps 10 reps knee presses 10 reps heel slides 10 reps SAQ's 10 reps ABD Followed by ICE     General Comments        Pertinent Vitals/Pain Pain Assessment: 0-10 Pain Score: 5  Pain Location: L hip Pain Descriptors / Indicators: Sore;Discomfort Pain Intervention(s): Monitored during session;Premedicated before session;Repositioned;Ice applied    Home Living                      Prior Function            PT Goals (current goals can now be found in the care plan section) Progress towards PT goals: Progressing toward goals    Frequency  7X/week    PT Plan Current plan remains appropriate    Co-evaluation             End of Session Equipment Utilized During Treatment: Gait belt Activity Tolerance: Patient tolerated treatment well Patient left: in chair;with call bell/phone within reach     Time: 0950-1020 PT Time Calculation (min) (ACUTE ONLY): 30 min  Charges:  $Gait Training: 8-22 mins $Therapeutic Exercise: 8-22  mins                    G Codes:      Rica Koyanagi  PTA [facility]  Acute  Rehab Pager      [phone]

## 2015-02-11 ENCOUNTER — Telehealth: Payer: Self-pay

## 2015-02-11 NOTE — Telephone Encounter (Signed)
Admit date: 02/07/2015 Discharge date: 02/10/2015  Reason for admission:  Principal Problem:  Hip fracture (HCC)-----CANNULATED HIP PINNING Active Problems:  Mitral valve regurgitation  Hypertension  Hypercholesteremia  Coronary artery disease  Tobacco abuse  Closed left hip fracture [facility](HCC)  Hospital follow up appt scheduled: 02/19/15 at 11:00 am with Dr. Beverely Lowabori.    Information documented below was provided by patient through her daughter  Transition Care Management Follow-up Telephone Call  How have you been since you were released from the hospital?  Recovering well.  Able to ambulate with walker.  Family very supportive and assists with care.  No complaints at this time.  Has hydrocodone for pain.    Do you understand why you were in the hospital? yes   Do you understand the discharge instructions? yes  Items Reviewed:  Medications reviewed: yes  Allergies reviewed: yes  Dietary changes reviewed: n/a  Referrals reviewed: yes, appt with Dr. Rayburn MaBlackmon scheduled (F/u appt 02/20/15 @ 4:15pm). Home health-PT has already been in home to evaluate and treat.   Functional Questionnaire:   Activities of Daily Living (ADLs):    Able to get around well.  Mostly able to care for self.  Family there for support.     Any transportation issues/concerns?: no, daughter will be bring patient to her appt.     Any patient concerns? no   Confirmed importance and date/time of follow-up visits scheduled: yes   Confirmed with patient if condition begins to worsen call PCP or go to the ER: yes

## 2015-02-12 NOTE — Anesthesia Postprocedure Evaluation (Signed)
Anesthesia Post Note  Patient: Sara BrickRebecca S [name]  Procedure(s) Performed: Procedure(s) (LRB): CANNULATED HIP PINNING (Left)  Anesthesia type: General  Patient location: PACU  Post pain: Pain level controlled and Adequate analgesia  Post assessment: Post-op Vital signs reviewed, Patient's Cardiovascular Status Stable, Respiratory Function Stable, Patent Airway and Pain level controlled  Last Vitals:  Filed Vitals:   02/10/15 0905  BP:   Pulse: 72  Temp:   Resp:     Post vital signs: Reviewed and stable  Level of consciousness: awake, alert  and oriented  Complications: No apparent anesthesia complications

## 2015-02-19 ENCOUNTER — Encounter: Payer: Self-pay | Admitting: Family Medicine

## 2015-02-19 ENCOUNTER — Ambulatory Visit (INDEPENDENT_AMBULATORY_CARE_PROVIDER_SITE_OTHER): Payer: Medicare Other | Admitting: Family Medicine

## 2015-02-19 VITALS — BP 130/70 | HR 91 | Temp 97.9°F | Resp 17 | Ht 65.0 in | Wt 126.1 lb

## 2015-02-19 DIAGNOSIS — Z636 Dependent relative needing care at home: Secondary | ICD-10-CM | POA: Diagnosis not present

## 2015-02-19 DIAGNOSIS — I10 Essential (primary) hypertension: Secondary | ICD-10-CM | POA: Diagnosis not present

## 2015-02-19 DIAGNOSIS — S72002D Fracture of unspecified part of neck of left femur, subsequent encounter for closed fracture with routine healing: Secondary | ICD-10-CM

## 2015-02-19 DIAGNOSIS — Z23 Encounter for immunization: Secondary | ICD-10-CM

## 2015-02-19 NOTE — Patient Instructions (Signed)
Schedule your complete physical w/ Dr Drue NovelPaz No med changes at this time If your mood changes, or you feel depressed or overwhelmed, call and we can restart the Zoloft Keep up the good work in physical therapy- you're doing great!!! Call with any questions or concerns Hang in there!!!

## 2015-02-19 NOTE — Assessment & Plan Note (Signed)
Pt's husband has passed away and she stopped the Zoloft.  She does not feel that she needs this medication at this time.  Will follow and restart as needed.

## 2015-02-19 NOTE — Progress Notes (Signed)
Pre visit review using our clinic review tool, if applicable. No additional management support is needed unless otherwise documented below in the visit note. 

## 2015-02-19 NOTE — Assessment & Plan Note (Signed)
New to provider.  Pt had surgical pinning done by Dr Rayburn MaBlackmon w/ f/u scheduled tomorrow.  She rates her current pain at 5/10.  Stressed that she should discuss pain control w/ ortho tomorrow.  Pt is doing well with her PT- 3x/week.  Will follow along and assist as able.

## 2015-02-19 NOTE — Progress Notes (Signed)
   Subjective:    Patient ID: Sara [name], female    DOB: 11/13/1940, 74 y.o.   MRN: [id number]  HPI Hospital f/u- pt was admitted 10/14-17 for L hip fx.  Pt reports she was getting into bed and 'lost my balance and went kaboom'.  Pt had surgery w/ Dr Rayburn MaBlackmon.  Currently doing PT- 3x/week.  Pt was told to stop Diovan and Zoloft but no reason was given at time of d/c.  Pt has stopped meds- daughter reports that no reason was given to them either.  Pt reports 'they say i'm doing good'.  Daughter and pt report that she hasn't felt well the last 2 days.  Pt has lost 5 lbs since May.  Daughter reports she's not eating much.  OT discussed increased protein intake w/ pt.   Review of Systems For ROS see HPI     Objective:   Physical Exam  Constitutional: She is oriented to person, place, and time. She appears well-developed and well-nourished. No distress.  HENT:  Head: Normocephalic and atraumatic.  Eyes: Conjunctivae and EOM are normal. Pupils are equal, round, and reactive to light.  Neck: Normal range of motion. Neck supple. No thyromegaly present.  Cardiovascular: Normal rate, regular rhythm, normal heart sounds and intact distal pulses.   No murmur heard. Pulmonary/Chest: Effort normal and breath sounds normal. No respiratory distress.  Abdominal: Soft. She exhibits no distension. There is no tenderness.  Musculoskeletal: She exhibits no edema.  Lymphadenopathy:    She has no cervical adenopathy.  Neurological: She is alert and oriented to person, place, and time.  Skin: Skin is warm and dry.  Psychiatric: She has a normal mood and affect. Her behavior is normal.  Vitals reviewed.         Assessment & Plan:

## 2015-02-19 NOTE — Assessment & Plan Note (Signed)
Chronic problem.  Reviewed pt's d/c summary and inpt notes and could not find reason for why Valsartan was stopped.  Pt's BP is adequately controlled today so no need for it at this time.  Pt does not know why med was dc'd.  Will continue to follow BP and restart prn.  Pt expressed understanding and is in agreement w/ plan.

## 2015-03-19 ENCOUNTER — Other Ambulatory Visit (INDEPENDENT_AMBULATORY_CARE_PROVIDER_SITE_OTHER): Payer: Medicare Other | Admitting: *Deleted

## 2015-03-19 ENCOUNTER — Encounter: Payer: Self-pay | Admitting: Cardiology

## 2015-03-19 ENCOUNTER — Ambulatory Visit (INDEPENDENT_AMBULATORY_CARE_PROVIDER_SITE_OTHER): Payer: Medicare Other | Admitting: Cardiology

## 2015-03-19 VITALS — BP 128/70 | HR 70 | Ht 65.0 in | Wt 124.0 lb

## 2015-03-19 DIAGNOSIS — I2583 Coronary atherosclerosis due to lipid rich plaque: Secondary | ICD-10-CM

## 2015-03-19 DIAGNOSIS — E7800 Pure hypercholesterolemia, unspecified: Secondary | ICD-10-CM

## 2015-03-19 DIAGNOSIS — I10 Essential (primary) hypertension: Secondary | ICD-10-CM

## 2015-03-19 DIAGNOSIS — I259 Chronic ischemic heart disease, unspecified: Secondary | ICD-10-CM | POA: Diagnosis not present

## 2015-03-19 DIAGNOSIS — I340 Nonrheumatic mitral (valve) insufficiency: Secondary | ICD-10-CM

## 2015-03-19 DIAGNOSIS — I2510 Atherosclerotic heart disease of native coronary artery without angina pectoris: Secondary | ICD-10-CM

## 2015-03-19 LAB — CBC WITH DIFFERENTIAL/PLATELET
BASOS PCT: 0 % (ref 0–1)
Basophils Absolute: 0 10*3/uL (ref 0.0–0.1)
EOS ABS: 0.2 10*3/uL (ref 0.0–0.7)
Eosinophils Relative: 2 % (ref 0–5)
HCT: 40.1 % (ref 36.0–46.0)
HEMOGLOBIN: 13.6 g/dL (ref 12.0–15.0)
LYMPHS ABS: 2.3 10*3/uL (ref 0.7–4.0)
Lymphocytes Relative: 21 % (ref 12–46)
MCH: 30.9 pg (ref 26.0–34.0)
MCHC: 33.9 g/dL (ref 30.0–36.0)
MCV: 91.1 fL (ref 78.0–100.0)
MONO ABS: 0.9 10*3/uL (ref 0.1–1.0)
MONOS PCT: 8 % (ref 3–12)
MPV: 9.8 fL (ref 8.6–12.4)
NEUTROS ABS: 7.5 10*3/uL (ref 1.7–7.7)
NEUTROS PCT: 69 % (ref 43–77)
Platelets: 325 10*3/uL (ref 150–400)
RBC: 4.4 MIL/uL (ref 3.87–5.11)
RDW: 13.9 % (ref 11.5–15.5)
WBC: 10.9 10*3/uL — ABNORMAL HIGH (ref 4.0–10.5)

## 2015-03-19 LAB — LIPID PANEL
CHOL/HDL RATIO: 3.1 ratio (ref <=5.0)
Cholesterol: 119 mg/dL — ABNORMAL LOW (ref 125–200)
HDL: 39 mg/dL — AB (ref >=46)
LDL CALC: 47 mg/dL (ref <130)
Triglycerides: 165 mg/dL — ABNORMAL HIGH (ref <150)
VLDL: 33 mg/dL — ABNORMAL HIGH (ref <30)

## 2015-03-19 LAB — TSH: TSH: 1.684 u[IU]/mL (ref 0.350–4.500)

## 2015-03-19 LAB — HEPATIC FUNCTION PANEL
ALBUMIN: 3.5 g/dL — AB (ref 3.6–5.1)
ALT: 10 U/L (ref 6–29)
AST: 14 U/L (ref 10–35)
Alkaline Phosphatase: 95 U/L (ref 33–130)
BILIRUBIN TOTAL: 0.6 mg/dL (ref 0.2–1.2)
Bilirubin, Direct: 0.1 mg/dL (ref <=0.2)
Indirect Bilirubin: 0.5 mg/dL (ref 0.2–1.2)
Total Protein: 5.9 g/dL — ABNORMAL LOW (ref 6.1–8.1)

## 2015-03-19 NOTE — Progress Notes (Signed)
Cardiology Office Note   Date:  03/19/2015   ID:  Sara BrickRebecca S Fedor, DOB 07/10/1940, MRN [id number]  PCP:  Willow OraJose Paz, MD  Cardiologist: Cassell Clementhomas Analina Filla MD  Chief Complaint  Patient presents with  . Hypertension      History of Present Illness: Sara [name] is a 74 y.o. female who presents for  Scheduled 6 month follow-up visit  This pleasant 74 year old woman is seen for a scheduled followup office visit. She has a history of essential hypertension and a history of hypercholesterolemia. Cardiac catheterization in 1994 showed mild three-vessel coronary atherosclerosis without obstructing lesions. She had a normal adenosine Cardiolite stress test in 2009. On 04/12/2012 the patient developed some tingling and clumsiness of her right hand. [facility]. She underwent a TEE on 04/12/12 which showed no intracardiac clot or mass but she did have severe aortic plaque. She was discharged and then returned on 04/24/12 for a left carotid endarterectomy and background patch angioplasty by Dr. Arbie CookeyEarly.   The patient is having more problem with back and leg pain and is now going to a pain management clinic. As we last saw her she underwent implantation of a nerve stimulator device. It was done by the physicians at the pain center and it was done at the [place] facility. The nerve stimulator has helped her back pain but has not really helped her leg pain. She has been told that the leg pain may be more related to her peripheral neuropathy. The patient has not been experiencing any recent chest pain or angina. She has had a poor appetite because of her chronic pain.  She has been losing weight. She complains of poor appetite and she attributes this to being in chronic pain from her back and her legs.  on 02/07/2015 the patient lost her balance and fell fracturing her left hip.  Dr. Magnus IvanBlackman and her hip. She underwent physical therapy at home.  She is now walking with a  cane. She made it through the orthopedic surgery without any cardiac complications.  Past Medical History  Diagnosis Date  . Mitral valve regurgitation     trace  . Hypercholesteremia   . Arthritis   . H/O hiatal hernia   . Stroke (HCC)   . Coronary artery disease     sees Dr. Patty SermonsBrackbill  . Hypertension     all managed by Dr. Haynes DageBrack bill  . Anxiety   . Bronchitis     hx of  . GERD (gastroesophageal reflux disease)     hx of  . Carotid artery occlusion     Past Surgical History  Procedure Laterality Date  . Cardiac catheterization  1994  . Hysterotomy    . Carpal tunnel release      left  . Hand surgery      bilateral  . Cholecystectomy    . Tee without cardioversion  04/12/2012    Procedure: TRANSESOPHAGEAL ECHOCARDIOGRAM (TEE);  Surgeon: Lewayne BuntingBrian S Crenshaw, MD;  [facility];  Service: Cardiovascular;  Laterality: N/A;  . Back surgery      x 3 Dr Juliene PinaGeoffrey, [facility] 2005, rods, fusion, four surgery  . Endarterectomy  04/24/2012    Procedure: ENDARTERECTOMY CAROTID;  Surgeon: Larina Earthlyodd F Early, MD;  [facility];  Service: Vascular;  Laterality: Left;  . Carotid endarterectomy    . Spinal cord stimulator insertion    . Hip pinning,cannulated Left 02/08/2015    Procedure: CANNULATED HIP PINNING;  Surgeon: Kathryne Hitch, MD;  Location: [facility] ORS;  Service: Orthopedics;  Laterality: Left;     Current Outpatient Prescriptions  Medication Sig Dispense Refill  . amLODipine (NORVASC) 10 MG tablet Take 5 mg by mouth daily.  2  . aspirin EC 325 MG EC tablet Take 1 tablet (325 mg total) by mouth daily with breakfast. 30 tablet 0  . atorvastatin (LIPITOR) 40 MG tablet TAKE 1/2 TABLET BY MOUTH DAILY 30 tablet 2  . diazepam (VALIUM) 5 MG tablet Take 2.5 mg by mouth daily as needed for anxiety (anxiety).    . gabapentin (NEURONTIN) 300 MG capsule Take 300-600 mg by mouth daily. take 2 tablets by mouth every night, and take 1 tablet by mouth as needed in the  morning for neuropathy    . HYDROcodone-acetaminophen (NORCO) 10-325 MG tablet Take 1 tablet by mouth every 6 (six) hours as needed for moderate pain (for pain). 60 tablet 0  . metoprolol (LOPRESSOR) 50 MG tablet TAKE 1/2 TABLET BY MOUTH TWICE A DAY 30 tablet 2  . nitroGLYCERIN (NITROSTAT) 0.4 MG SL tablet Place 0.4 mg under the tongue every 5 (five) minutes as needed (for chest pain).     Marland Kitchen omeprazole (PRILOSEC OTC) 20 MG tablet Take 20 mg by mouth daily.     No current facility-administered medications for this visit.    Allergies:   Percocet; Hydrochlorothiazide; Indomethacin; Paroxetine hcl; Penicillins; Pravachol; Quinine derivatives; Wellbutrin; and Zocor    Social History:  The patient  reports that she has been smoking Cigarettes.  She has a 30 pack-year smoking history. She has never used smokeless tobacco. She reports that she does not drink alcohol or use illicit drugs.   Family History:  The patient's family history includes Cancer in her sister; Heart attack in her father; Heart disease in her father, sister, and sister.    ROS:  Please see the history of present illness.   Otherwise, review of systems are positive for none.   All other systems are reviewed and negative.    PHYSICAL EXAM: VS:  BP 128/70 mmHg  Pulse 70  Ht  (1.651 m)  Wt 124 lb (56.246 kg)  BMI 20.63 kg/m2  SpO2 98% , BMI Body mass index is 20.63 kg/(m^2). GEN: Well nourished, well developed, in no acute distress HEENT: normal Neck: no JVD, carotid bruits, or masses Cardiac: RRR; no murmurs, rubs, or gallops,no edema  Respiratory:  clear to auscultation bilaterally, normal work of breathing GI: soft, nontender, nondistended, + BS MS: no deformity or atrophy Skin: warm and dry, no rash Neuro:  Strength and sensation are intact Psych: euthymic mood, full affect   EKG:  EKG is not ordered today.    Recent Labs: 02/08/2015: ALT 35; BUN 18; Creatinine, Ser 0.64; Hemoglobin 11.5*; Platelets 246;  Potassium 4.0; Sodium 140    Lipid Panel    Component Value Date/Time   CHOL 135 09/20/2014 1037   TRIG 193.0* 09/20/2014 1037   HDL 39.10 09/20/2014 1037   CHOLHDL 3 09/20/2014 1037   VLDL 38.6 09/20/2014 1037   LDLCALC 57 09/20/2014 1037   LDLDIRECT 70.0 05/23/2014 1129      Wt Readings from Last 3 Encounters:  03/19/15 124 lb (56.246 kg)  02/19/15 126 lb 2 oz (57.21 kg)  02/07/15 125 lb (56.7 kg)         ASSESSMENT AND PLAN:  1. three-vessel nonocclusive coronary artery disease by cardiac catheterization in 1994. Normal adenosine Cardiolite stress test in 2009  2. history of TIA in December 2013 and underwent left carotid endarterectomy by Dr. Arbie Cookey 3. chronic low back pain and leg pain, now under the care of pain management. She has an implanted spinal nerve stimulator. 4. Hypercholesterolemia 5. essential hypertension without heart failure 6. Peripheral neuropathy 7.  Fracture left hip secondary to mechanical fall 02/07/2015. Dr. Magnus Ivan is her orthopedist.  Current medicines are reviewed at length with the patient today.  The patient does not have concerns regarding medicines.  The following changes have been made:  no change  Labs/ tests ordered today include:  No orders of the defined types were placed in this encounter.     disposition: she will continue current medication. Recheck in 6 months for follow-up office visit and fasting lipid panel hepatic function panel and basal metabolic panel with Dr. Anne Fu  Signed, Cassell Clement MD 03/19/2015 11:09 AM    [facility] [address] [place], [place], [place]  [address] Phone: [phone]; Fax: [phone]

## 2015-03-19 NOTE — Addendum Note (Signed)
Addended by: Tonita PhoenixBOWDEN, ROBIN K on: 03/19/2015 09:54 AM   Modules accepted: Orders

## 2015-03-19 NOTE — Patient Instructions (Signed)
Medication Instructions:  Your physician recommends that you continue on your current medications as directed. Please refer to the Current Medication list given to you today.  Labwork: none  Testing/Procedures: none  Follow-Up: Your physician wants you to follow-up in: 6 months with fasting labs (lp/bmet/hfp) with Dr Skains  You will receive a reminder letter in the mail two months in advance. If you don't receive a letter, please call our office to schedule the follow-up appointment.  If you need a refill on your cardiac medications before your next appointment, please call your pharmacy.  

## 2015-03-21 NOTE — Progress Notes (Signed)
Quick Note:  Please report to patient. The recent labs are stable. Continue same medication and careful diet. Thyroid okay ______ 

## 2015-04-10 ENCOUNTER — Other Ambulatory Visit: Payer: Self-pay | Admitting: Cardiology

## 2015-05-21 ENCOUNTER — Other Ambulatory Visit: Payer: Self-pay | Admitting: Cardiology

## 2015-05-23 ENCOUNTER — Telehealth: Payer: Self-pay | Admitting: *Deleted

## 2015-05-23 NOTE — Telephone Encounter (Signed)
Patient left a voicemail requesting a refill on gabapentin. Please advise. Thanks, MI

## 2015-05-26 ENCOUNTER — Telehealth: Payer: Self-pay | Admitting: Cardiology

## 2015-05-26 NOTE — Telephone Encounter (Signed)
Refilled as requested, ok per  Dr. Brackbill  

## 2015-05-26 NOTE — Telephone Encounter (Signed)
Pt calling requesting a refill on gabapentin 300 mg tablet. Please advise.

## 2015-06-02 ENCOUNTER — Encounter: Payer: Self-pay | Admitting: *Deleted

## 2015-06-02 ENCOUNTER — Telehealth: Payer: Self-pay | Admitting: *Deleted

## 2015-06-02 NOTE — Telephone Encounter (Signed)
Pre-Visit Call completed with patient and chart updated.   Pre-Visit Info documented in Specialty Comments under [employer].    

## 2015-06-02 NOTE — Telephone Encounter (Signed)
Dr. Drue Novel, [place]. Pt is scheduled for new pt appt 06/02/14.  To Discuss with Provider:   "I think I have a UTI." Pt endorses burning w/ urination, foul smelling urine, continuous 5/10 low back aching and lower abdominal pain that is increased w/ urination x10 days. Denies fever, chills, severe pain. Has tried to increase water intake at home.   Pt also has a "spot" on her L back "right above her bra line" that is tender and sore to palpation. Denies redness, mole, and cysts. States it is something more "internally." The "spot" has been there "quite a while" and has not been evaluated by a physician before.

## 2015-06-03 ENCOUNTER — Ambulatory Visit (HOSPITAL_BASED_OUTPATIENT_CLINIC_OR_DEPARTMENT_OTHER)
Admission: RE | Admit: 2015-06-03 | Discharge: 2015-06-03 | Disposition: A | Discharge status: Home / Self Care | Payer: Medicare Other | Source: Ambulatory Visit | Attending: Internal Medicine | Admitting: Internal Medicine

## 2015-06-03 ENCOUNTER — Ambulatory Visit (INDEPENDENT_AMBULATORY_CARE_PROVIDER_SITE_OTHER): Payer: Medicare Other | Admitting: Internal Medicine

## 2015-06-03 ENCOUNTER — Encounter: Payer: Self-pay | Admitting: Internal Medicine

## 2015-06-03 VITALS — BP 124/82 | HR 83 | Temp 97.9°F | Ht 65.0 in | Wt 125.2 lb

## 2015-06-03 DIAGNOSIS — A419 Sepsis, unspecified organism: Secondary | ICD-10-CM | POA: Diagnosis not present

## 2015-06-03 DIAGNOSIS — R8290 Unspecified abnormal findings in urine: Secondary | ICD-10-CM

## 2015-06-03 DIAGNOSIS — I2510 Atherosclerotic heart disease of native coronary artery without angina pectoris: Secondary | ICD-10-CM

## 2015-06-03 DIAGNOSIS — M546 Pain in thoracic spine: Secondary | ICD-10-CM | POA: Diagnosis not present

## 2015-06-03 DIAGNOSIS — M5134 Other intervertebral disc degeneration, thoracic region: Secondary | ICD-10-CM | POA: Insufficient documentation

## 2015-06-03 DIAGNOSIS — R509 Fever, unspecified: Secondary | ICD-10-CM | POA: Diagnosis not present

## 2015-06-03 DIAGNOSIS — Z7189 Other specified counseling: Secondary | ICD-10-CM | POA: Diagnosis not present

## 2015-06-03 DIAGNOSIS — R399 Unspecified symptoms and signs involving the genitourinary system: Secondary | ICD-10-CM | POA: Diagnosis not present

## 2015-06-03 DIAGNOSIS — N390 Urinary tract infection, site not specified: Secondary | ICD-10-CM

## 2015-06-03 DIAGNOSIS — Z7689 Persons encountering health services in other specified circumstances: Secondary | ICD-10-CM

## 2015-06-03 LAB — POCT URINALYSIS DIPSTICK
Bilirubin, UA: NEGATIVE
Glucose, UA: NEGATIVE
Ketones, UA: NEGATIVE
NITRITE UA: POSITIVE
SPEC GRAV UA: 1.025
UROBILINOGEN UA: 0.2
pH, UA: 6

## 2015-06-03 MED ORDER — ATORVASTATIN CALCIUM 40 MG PO TABS: 20.0000 mg | ORAL_TABLET | Freq: Every day | ORAL | Status: DC

## 2015-06-03 MED ORDER — AMLODIPINE BESYLATE 10 MG PO TABS: 5.0000 mg | ORAL_TABLET | Freq: Every day | ORAL | Status: DC

## 2015-06-03 MED ORDER — GABAPENTIN 300 MG PO CAPS: 900.0000 mg | ORAL_CAPSULE | Freq: Every day | ORAL | Status: DC

## 2015-06-03 MED ORDER — CIPROFLOXACIN HCL 500 MG PO TABS: 500.0000 mg | ORAL_TABLET | Freq: Two times a day (BID) | ORAL | Status: DC

## 2015-06-03 MED ORDER — METOPROLOL TARTRATE 50 MG PO TABS: 25.0000 mg | ORAL_TABLET | Freq: Two times a day (BID) | ORAL | Status: DC

## 2015-06-03 MED ORDER — OMEPRAZOLE MAGNESIUM 20 MG PO TBEC: 20.0000 mg | DELAYED_RELEASE_TABLET | Freq: Every day | ORAL | Status: DC

## 2015-06-03 NOTE — Progress Notes (Signed)
Pre visit review using our clinic review tool, if applicable. No additional management support is needed unless otherwise documented below in the visit note. 

## 2015-06-03 NOTE — Patient Instructions (Signed)
GO TO THE FRONT DESK Schedule a routine office visit or check up to be done in  3 months  No  fasting   Front desk:    30   STOP BY THE FIRST FLOOR:  get the XR   ----------------------------------------------------------------- Take the antibiotic, ciprofloxacin for 3 days, drink plenty of fluids; if your urinary symptoms are not better let us know  Take the medications  As prescribed  including a medication called metoprolol.

## 2015-06-03 NOTE — Progress Notes (Signed)
Subjective:    Patient ID: Sara [name], female    DOB: Sep 14, 1940, 75 y.o.   MRN: [id number]  DOS:  06/03/2015 Type of visit - description : To get established Interval history: New patient to me, records reviewed and summarized below. Complaining of dysuria, dark urine with some " odor" for the last 10 days associated with mild discomfort at the lower abdomen. She has chronic low back pain however for about a year has noted a left from the distal T-spine pain triggered by sitting and  applying pressure to that area.  She stopped tobacco!   Review of Systems Denies fever chills No chest pain or difficulty breathing No nausea, vomiting, diarrhea. No vaginal discharge or genital rash.  Past Medical History  Diagnosis Date  . Mitral valve regurgitation     trace  . Hypercholesteremia   . Arthritis   . H/O hiatal hernia   . Stroke (HCC)   . Coronary artery disease     sees Dr. Patty Sermons  . Hypertension     all managed by Dr. Haynes Dage bill  . Anxiety   . Bronchitis     hx of  . GERD (gastroesophageal reflux disease)     hx of  . Carotid artery occlusion     Past Surgical History  Procedure Laterality Date  . Cardiac catheterization  1994  . Hysterotomy    . Carpal tunnel release      left  . Hand surgery      bilateral  . Cholecystectomy    . Tee without cardioversion  04/12/2012    Procedure: TRANSESOPHAGEAL ECHOCARDIOGRAM (TEE);  Surgeon: Lewayne Bunting, MD;  Location: [facility] ENDOSCOPY;  Service: Cardiovascular;  Laterality: N/A;  . Back surgery      x 3 Dr Juliene Pina, last @ [facility] ~ 2005, rods, fusion, four surgery  . Endarterectomy  04/24/2012    Procedure: ENDARTERECTOMY CAROTID;  Surgeon: Larina Earthly, MD;  Location: [facility] OR;  Service: Vascular;  Laterality: Left;  . Carotid endarterectomy    . Spinal cord stimulator insertion    . Hip pinning,cannulated Left 02/08/2015    Procedure: CANNULATED HIP PINNING;  Surgeon: Kathryne Hitch, MD;  Location: [facility] ORS;   Service: Orthopedics;  Laterality: Left;  . Hip fracture surgery Left     Social History   Social History  . Marital Status: Married    Spouse Name: N/A  . Number of Children: 4  . Years of Education: N/A   Occupational History  . retired     Social History Main Topics  . Smoking status: Former Smoker -- 0.50 packs/day for 60 years    Types: Cigarettes  . Smokeless tobacco: Never Used     Comment: quit 04-2015  . Alcohol Use: No  . Drug Use: No  . Sexual Activity: Not on file   Other Topics Concern  . Not on file   Social History Narrative   Lives w/ husband   4 daughters , 2 in [place]        Medication List       This list is accurate as of: 06/03/15  8:39 PM.  Always use your most recent med list.               amLODipine 10 MG tablet  Commonly known as:  NORVASC  Take 0.5 tablets (5 mg total) by mouth daily.     aspirin 325 MG EC tablet  Take 1 tablet (325 mg total)  by mouth daily with breakfast.     atorvastatin 40 MG tablet  Commonly known as:  LIPITOR  Take 0.5 tablets (20 mg total) by mouth daily.     ciprofloxacin 500 MG tablet  Commonly known as:  CIPRO  Take 1 tablet (500 mg total) by mouth 2 (two) times daily.     diazepam 5 MG tablet  Commonly known as:  VALIUM  Take 2.5 mg by mouth daily as needed for anxiety (anxiety).     gabapentin 300 MG capsule  Commonly known as:  NEURONTIN  Take 3 capsules (900 mg total) by mouth at bedtime. Or as directed Additional refills from PCP     HYDROcodone-acetaminophen 10-325 MG tablet  Commonly known as:  NORCO  Take 1 tablet by mouth every 6 (six) hours as needed for moderate pain (for pain).     metoprolol 50 MG tablet  Commonly known as:  LOPRESSOR  Take 0.5 tablets (25 mg total) by mouth 2 (two) times daily.     nitroGLYCERIN 0.4 MG SL tablet  Commonly known as:  NITROSTAT  Place 0.4 mg under the tongue every 5 (five) minutes as needed (for chest pain).     omeprazole 20 MG tablet  Commonly  known as:  PRILOSEC OTC  Take 1 tablet (20 mg total) by mouth daily.           Objective:   Physical Exam BP 124/82 mmHg  Pulse 83  Temp(Src) 97.9 F (36.6 C) (Oral)  Ht  (1.651 m)  Wt 125 lb 4 oz (56.813 kg)  BMI 20.84 kg/m2  SpO2 98% General:   Well developed, well nourished . NAD.  HEENT:  Normocephalic . Face symmetric, atraumatic Lungs:  CTA B Normal respiratory effort, no intercostal retractions, no accessory muscle use. Heart: RRR,  + systolic murmur.  Abdome: Minimal tenderness at the lower abdomen mostly in the suprapubic area, no mass or rebound. No CVA tenderness MSK: not particularly TTP at the distal thoracic spine No pretibin: al edema bilaterally  Skin: Not pale. Not jaundice Neurologic:  alert & oriented X3.  Speech normal, gait appropriate for age and unassisted Psych--  Cognition and judgment appear intact.  Cooperative with normal attention span and concentration.  Behavior appropriate. No anxious or depressed appearing.      Assessment & Plan:   Assessment HTN Hyperlipidemia Anxiety-- rarely uses diazepam CV: --CAD, Dr. Patty Sermons --H/o Stroke 2013:non hemorrhagic infarcts scattered throughout the [place] Hem[place] . 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  RTC  3 months

## 2015-06-04 ENCOUNTER — Emergency Department (HOSPITAL_COMMUNITY): Payer: Medicare Other

## 2015-06-04 ENCOUNTER — Inpatient Hospital Stay (HOSPITAL_COMMUNITY)
Admission: EM | Admit: 2015-06-04 | Discharge: 2015-06-07 | DRG: 872 | Disposition: A | Discharge status: Home / Self Care | Payer: Medicare Other | Attending: Internal Medicine | Admitting: Internal Medicine

## 2015-06-04 ENCOUNTER — Encounter (HOSPITAL_COMMUNITY): Payer: Self-pay

## 2015-06-04 DIAGNOSIS — E870 Hyperosmolality and hypernatremia: Secondary | ICD-10-CM | POA: Diagnosis present

## 2015-06-04 DIAGNOSIS — I10 Essential (primary) hypertension: Secondary | ICD-10-CM | POA: Diagnosis present

## 2015-06-04 DIAGNOSIS — R945 Abnormal results of liver function studies: Secondary | ICD-10-CM

## 2015-06-04 DIAGNOSIS — Z87891 Personal history of nicotine dependence: Secondary | ICD-10-CM | POA: Diagnosis not present

## 2015-06-04 DIAGNOSIS — R740 Nonspecific elevation of levels of transaminase and lactic acid dehydrogenase [LDH]: Secondary | ICD-10-CM | POA: Diagnosis not present

## 2015-06-04 DIAGNOSIS — E876 Hypokalemia: Secondary | ICD-10-CM | POA: Diagnosis present

## 2015-06-04 DIAGNOSIS — R509 Fever, unspecified: Secondary | ICD-10-CM

## 2015-06-04 DIAGNOSIS — N390 Urinary tract infection, site not specified: Secondary | ICD-10-CM | POA: Diagnosis not present

## 2015-06-04 DIAGNOSIS — A419 Sepsis, unspecified organism: Secondary | ICD-10-CM | POA: Diagnosis present

## 2015-06-04 DIAGNOSIS — Y92019 Unspecified place in single-family (private) house as the place of occurrence of the external cause: Secondary | ICD-10-CM | POA: Diagnosis not present

## 2015-06-04 DIAGNOSIS — B9620 Unspecified Escherichia coli [E. coli] as the cause of diseases classified elsewhere: Secondary | ICD-10-CM | POA: Diagnosis present

## 2015-06-04 DIAGNOSIS — N136 Pyonephrosis: Secondary | ICD-10-CM | POA: Diagnosis present

## 2015-06-04 DIAGNOSIS — Z90710 Acquired absence of both cervix and uterus: Secondary | ICD-10-CM

## 2015-06-04 DIAGNOSIS — I2510 Atherosclerotic heart disease of native coronary artery without angina pectoris: Secondary | ICD-10-CM | POA: Diagnosis present

## 2015-06-04 DIAGNOSIS — A4151 Sepsis due to Escherichia coli [E. coli]: Secondary | ICD-10-CM | POA: Diagnosis not present

## 2015-06-04 DIAGNOSIS — I9589 Other hypotension: Secondary | ICD-10-CM | POA: Diagnosis not present

## 2015-06-04 DIAGNOSIS — Z7982 Long term (current) use of aspirin: Secondary | ICD-10-CM | POA: Diagnosis not present

## 2015-06-04 DIAGNOSIS — Z981 Arthrodesis status: Secondary | ICD-10-CM

## 2015-06-04 DIAGNOSIS — N179 Acute kidney failure, unspecified: Secondary | ICD-10-CM | POA: Diagnosis present

## 2015-06-04 DIAGNOSIS — W19XXXA Unspecified fall, initial encounter: Secondary | ICD-10-CM

## 2015-06-04 DIAGNOSIS — R55 Syncope and collapse: Secondary | ICD-10-CM | POA: Diagnosis present

## 2015-06-04 DIAGNOSIS — Z8673 Personal history of transient ischemic attack (TIA), and cerebral infarction without residual deficits: Secondary | ICD-10-CM

## 2015-06-04 DIAGNOSIS — E440 Moderate protein-calorie malnutrition: Secondary | ICD-10-CM | POA: Diagnosis not present

## 2015-06-04 DIAGNOSIS — Z8249 Family history of ischemic heart disease and other diseases of the circulatory system: Secondary | ICD-10-CM

## 2015-06-04 DIAGNOSIS — R269 Unspecified abnormalities of gait and mobility: Secondary | ICD-10-CM | POA: Diagnosis present

## 2015-06-04 DIAGNOSIS — R7401 Elevation of levels of liver transaminase levels: Secondary | ICD-10-CM | POA: Diagnosis present

## 2015-06-04 DIAGNOSIS — Z79899 Other long term (current) drug therapy: Secondary | ICD-10-CM | POA: Diagnosis not present

## 2015-06-04 DIAGNOSIS — I959 Hypotension, unspecified: Secondary | ICD-10-CM | POA: Insufficient documentation

## 2015-06-04 DIAGNOSIS — R7989 Other specified abnormal findings of blood chemistry: Secondary | ICD-10-CM

## 2015-06-04 LAB — CBC WITH DIFFERENTIAL/PLATELET
BASOS ABS: 0 10*3/uL (ref 0.0–0.1)
Basophils Relative: 0 %
Eosinophils Absolute: 0 10*3/uL (ref 0.0–0.7)
Eosinophils Relative: 0 %
HCT: 37.8 % (ref 36.0–46.0)
HEMOGLOBIN: 11.9 g/dL — AB (ref 12.0–15.0)
LYMPHS PCT: 5 %
Lymphs Abs: 0.8 10*3/uL (ref 0.7–4.0)
MCH: 30.3 pg (ref 26.0–34.0)
MCHC: 31.5 g/dL (ref 30.0–36.0)
MCV: 96.2 fL (ref 78.0–100.0)
MONOS PCT: 5 %
Monocytes Absolute: 0.8 10*3/uL (ref 0.1–1.0)
NEUTROS ABS: 15.2 10*3/uL — AB (ref 1.7–7.7)
Neutrophils Relative %: 90 %
PLATELETS: 199 10*3/uL (ref 150–400)
RBC: 3.93 MIL/uL (ref 3.87–5.11)
RDW: 14.4 % (ref 11.5–15.5)
WBC MORPHOLOGY: INCREASED
WBC: 16.8 10*3/uL — ABNORMAL HIGH (ref 4.0–10.5)

## 2015-06-04 LAB — URINALYSIS, ROUTINE W REFLEX MICROSCOPIC
BILIRUBIN URINE: NEGATIVE
BILIRUBIN URINE: NEGATIVE
GLUCOSE, UA: NEGATIVE mg/dL
KETONES UR: NEGATIVE
KETONES UR: NEGATIVE mg/dL
Nitrite: POSITIVE — AB
Nitrite: NEGATIVE
PH: 6 (ref 5.0–8.0)
PROTEIN: 30 mg/dL — AB
Specific Gravity, Urine: 1.02 (ref 1.000–1.030)
Specific Gravity, Urine: 1.014 (ref 1.005–1.030)
TOTAL PROTEIN, URINE-UPE24: 100 — AB
UROBILINOGEN UA: 0.2 (ref 0.0–1.0)
Urine Glucose: NEGATIVE
pH: 6 (ref 5.0–8.0)

## 2015-06-04 LAB — I-STAT TROPONIN, ED: Troponin i, poc: 0 ng/mL (ref 0.00–0.08)

## 2015-06-04 LAB — COMPREHENSIVE METABOLIC PANEL
ALK PHOS: 194 U/L — AB (ref 38–126)
ALT: 273 U/L — ABNORMAL HIGH (ref 14–54)
ANION GAP: 12 (ref 5–15)
AST: 307 U/L — ABNORMAL HIGH (ref 15–41)
Albumin: 2.8 g/dL — ABNORMAL LOW (ref 3.5–5.0)
BILIRUBIN TOTAL: 0.4 mg/dL (ref 0.3–1.2)
BUN: 22 mg/dL — ABNORMAL HIGH (ref 6–20)
CALCIUM: 8.3 mg/dL — AB (ref 8.9–10.3)
CO2: 20 mmol/L — ABNORMAL LOW (ref 22–32)
Chloride: 111 mmol/L (ref 101–111)
Creatinine, Ser: 1.14 mg/dL — ABNORMAL HIGH (ref 0.44–1.00)
GFR, EST AFRICAN AMERICAN: 52 mL/min — AB (ref >60)
GFR, EST NON AFRICAN AMERICAN: 45 mL/min — AB (ref >60)
GLUCOSE: 114 mg/dL — AB (ref 65–99)
Potassium: 3.1 mmol/L — ABNORMAL LOW (ref 3.5–5.1)
Sodium: 143 mmol/L (ref 135–145)
TOTAL PROTEIN: 5.6 g/dL — AB (ref 6.5–8.1)

## 2015-06-04 LAB — GLUCOSE, CAPILLARY: Glucose-Capillary: 94 mg/dL (ref 65–99)

## 2015-06-04 LAB — I-STAT CG4 LACTIC ACID, ED
LACTIC ACID, VENOUS: 3.84 mmol/L — AB (ref 0.5–2.0)
LACTIC ACID, VENOUS: 2.75 mmol/L — AB (ref 0.5–2.0)

## 2015-06-04 LAB — URINE MICROSCOPIC-ADD ON

## 2015-06-04 MED ORDER — AZTREONAM 2 G IJ SOLR
2.0000 g | Freq: Once | INTRAMUSCULAR | Status: AC
  Administered 2015-06-04: 2 g via INTRAVENOUS
  Filled 2015-06-04: qty 2

## 2015-06-04 MED ORDER — ONDANSETRON HCL 4 MG/2ML IJ SOLN
4.0000 mg | Freq: Four times a day (QID) | INTRAMUSCULAR | Status: DC | PRN
  Administered 2015-06-06: 4 mg via INTRAVENOUS
  Filled 2015-06-04: qty 2

## 2015-06-04 MED ORDER — ENOXAPARIN SODIUM 30 MG/0.3ML ~~LOC~~ SOLN
30.0000 mg | Freq: Every day | SUBCUTANEOUS | Status: DC
  Administered 2015-06-05: 30 mg via SUBCUTANEOUS
  Filled 2015-06-04: qty 0.3

## 2015-06-04 MED ORDER — OMEPRAZOLE 20 MG PO CPDR
20.0000 mg | DELAYED_RELEASE_CAPSULE | Freq: Every day | ORAL | Status: DC
  Administered 2015-06-05 – 2015-06-07 (×3): 20 mg via ORAL
  Filled 2015-06-04 (×3): qty 1

## 2015-06-04 MED ORDER — ACETAMINOPHEN 325 MG PO TABS
650.0000 mg | ORAL_TABLET | Freq: Four times a day (QID) | ORAL | Status: DC | PRN
  Administered 2015-06-05 – 2015-06-06 (×3): 650 mg via ORAL
  Filled 2015-06-04 (×4): qty 2

## 2015-06-04 MED ORDER — SODIUM CHLORIDE 0.9 % IV SOLN
INTRAVENOUS | Status: AC
  Administered 2015-06-04: via INTRAVENOUS

## 2015-06-04 MED ORDER — ACETAMINOPHEN 650 MG RE SUPP
650.0000 mg | Freq: Four times a day (QID) | RECTAL | Status: DC | PRN
Start: 2015-06-04 — End: 2015-06-07

## 2015-06-04 MED ORDER — ONDANSETRON HCL 4 MG PO TABS: 4.0000 mg | ORAL_TABLET | Freq: Four times a day (QID) | ORAL | Status: DC | PRN

## 2015-06-04 MED ORDER — DIAZEPAM 5 MG PO TABS
2.5000 mg | ORAL_TABLET | Freq: Every day | ORAL | Status: DC | PRN
  Administered 2015-06-05: 2.5 mg via ORAL
  Filled 2015-06-04: qty 1

## 2015-06-04 MED ORDER — HYDROMORPHONE HCL 1 MG/ML IJ SOLN
0.5000 mg | INTRAMUSCULAR | Status: DC | PRN
  Administered 2015-06-05 (×2): 1 mg via INTRAVENOUS
  Filled 2015-06-04 (×2): qty 1

## 2015-06-04 MED ORDER — DEXTROSE 5 % IV SOLN
1.0000 g | Freq: Three times a day (TID) | INTRAVENOUS | Status: DC
  Administered 2015-06-05 – 2015-06-06 (×4): 1 g via INTRAVENOUS
  Filled 2015-06-04 (×5): qty 1

## 2015-06-04 MED ORDER — IOHEXOL 300 MG/ML  SOLN
100.0000 mL | Freq: Once | INTRAMUSCULAR | Status: AC | PRN
  Administered 2015-06-04: 80 mL via INTRAVENOUS

## 2015-06-04 MED ORDER — SODIUM CHLORIDE 0.9 % IV BOLUS (SEPSIS)
1000.0000 mL | INTRAVENOUS | Status: AC
  Administered 2015-06-04 (×2): 1000 mL via INTRAVENOUS

## 2015-06-04 MED ORDER — LEVOFLOXACIN IN D5W 750 MG/150ML IV SOLN: 750.0000 mg | INTRAVENOUS | Status: DC

## 2015-06-04 MED ORDER — ASPIRIN EC 325 MG PO TBEC
325.0000 mg | DELAYED_RELEASE_TABLET | Freq: Every day | ORAL | Status: DC
  Administered 2015-06-06 – 2015-06-07 (×2): 325 mg via ORAL
  Filled 2015-06-04 (×3): qty 1

## 2015-06-04 MED ORDER — GABAPENTIN 300 MG PO CAPS
900.0000 mg | ORAL_CAPSULE | Freq: Every day | ORAL | Status: DC
Start: 2015-06-04 — End: 2015-06-07
  Administered 2015-06-05 – 2015-06-06 (×3): 900 mg via ORAL
  Filled 2015-06-04 (×3): qty 3

## 2015-06-04 MED ORDER — ALUM & MAG HYDROXIDE-SIMETH 200-200-20 MG/5ML PO SUSP: 30.0000 mL | Freq: Four times a day (QID) | ORAL | Status: DC | PRN

## 2015-06-04 MED ORDER — AZTREONAM 1 G IJ SOLR
1.0000 g | Freq: Three times a day (TID) | INTRAMUSCULAR | Status: DC
  Filled 2015-06-04: qty 1

## 2015-06-04 MED ORDER — LEVOFLOXACIN IN D5W 750 MG/150ML IV SOLN
750.0000 mg | Freq: Once | INTRAVENOUS | Status: AC
  Administered 2015-06-04: 750 mg via INTRAVENOUS
  Filled 2015-06-04: qty 150

## 2015-06-04 MED ORDER — SODIUM CHLORIDE 0.9% FLUSH
3.0000 mL | Freq: Two times a day (BID) | INTRAVENOUS | Status: DC
  Administered 2015-06-04 – 2015-06-07 (×4): 3 mL via INTRAVENOUS

## 2015-06-04 NOTE — ED Notes (Signed)
Pt stated that she cannot urinate.  RN kelly will do the in and out.

## 2015-06-04 NOTE — ED Notes (Signed)
Per [facility]- Pt recent DX with UTI- RX started last night. Weakness today. Possible fall. Pt awoke on the floor. Unknown amount of time on the floor. Family member also DX with the flu. C collar intact for spinal support. Tylenol  PO given. 750 ml NS IVP given in route. Pt alert and oriented at present. Temp. 101.7. Orthostatic changes. Hypotensive on scene. Recovered with fluids

## 2015-06-04 NOTE — H&P (Addendum)
[facility] Admission History and Physical       Sara [name] ZOX:[id number] DOB: 1940/12/28 DOA: 06/04/2015  Referring physician: EDP PCP: [name], MD  Specialists:   Chief Complaint: Fever Chills Weakness  HPI: Sara [name] is a 75 y.o. female with a history of a previous CVA, CAD, HTN who had been seen by her PCP yesterday and diagnosed with a UTI and placed on Cipro.   She had taken her first dose last night and a second dose this AM.  In the afternoon, she suffered a fall in her house, and had increased weakness.  She had  Fevers and Chills and a temperature of 101.7. Per family who is at the bedside she had almost fainted.   EMS was called and she was found to have hypotension, and was given a bolus of IV fluids on route.   In the ED, she was further evaluated and found to have an elevated Lactic Acid level of 3.84, and a UTI, and she was placed on IV Levaquin and Aztreonam for Sepsis and UTI.    Her blood pressures improved with IVFs, and she was referred for admission.      Review of Systems:   Constitutional: No Weight Loss, No Weight Gain, Night Sweats, +Fevers, +Chills, Dizziness, Light Headedness, Fatigue, +Generalized Weakness HEENT: No Headaches, Difficulty Swallowing,Tooth/Dental Problems,Sore Throat,  No Sneezing, Rhinitis, Ear Ache, Nasal Congestion, or Post Nasal Drip,  Cardio-vascular:  No Chest pain, Orthopnea, PND, Edema in Lower Extremities, Anasarca, Dizziness, Palpitations  Resp: No Dyspnea, No DOE, No Productive Cough, No Non-Productive Cough, No Hemoptysis, No Wheezing.    GI: No Heartburn, Indigestion, Abdominal Pain, Nausea, Vomiting, Diarrhea, Constipation, Hematemesis, Hematochezia, Melena, Change in Bowel Habits,  Loss of Appetite  GU: No Dysuria, No Change in Color of Urine, No Urgency or Urinary Frequency, No Flank pain.  Musculoskeletal: No Joint Pain or Swelling, No Decreased Range of Motion, No Back Pain.  Neurologic: No Syncope, No Seizures,  Muscle Weakness, Paresthesia, Vision Disturbance or Loss, No Diplopia, No Vertigo, No Difficulty Walking,  Skin: No Rash or Lesions. Psych: No Change in Mood or Affect, No Depression or Anxiety, No Memory loss, No Confusion, or Hallucinations   Past Medical History  Diagnosis Date  . Mitral valve regurgitation     trace  . Hypercholesteremia   . Arthritis   . H/O hiatal hernia   . Stroke (HCC)   . Coronary artery disease     sees Dr. Patty Sermons  . Hypertension     all managed by Dr. Haynes Dage bill  . Anxiety   . Bronchitis     hx of  . GERD (gastroesophageal reflux disease)     hx of  . Carotid artery occlusion      Past Surgical History  Procedure Laterality Date  . Cardiac catheterization  1994  . Hysterotomy    . Carpal tunnel release      left  . Hand surgery      bilateral  . Cholecystectomy    . Tee without cardioversion  04/12/2012    Procedure: TRANSESOPHAGEAL ECHOCARDIOGRAM (TEE);  Surgeon: Lewayne Bunting, MD;  Location[facility] ENDOSCOPY;  Service: Cardiovascular;  Laterality: N/A;  . Back surgery      x 3 Dr Juliene Pina, last @ [facility] ~ 2005, rods, fusion, four surgery  . Endarterectomy  04/24/2012    Procedure: ENDARTERECTOMY CAROTID;  Surgeon: Larina Earthly, MD;  Location: [facility] OR;  Service: Vascular;  Laterality: Left;  .  Carotid endarterectomy    . Spinal cord stimulator insertion    . Hip pinning,cannulated Left 02/08/2015    Procedure: CANNULATED HIP PINNING;  Surgeon: Kathryne Hitch, MD;  Location: [facility] ORS;  Service: Orthopedics;  Laterality: Left;  . Hip fracture surgery Left       Prior to Admission medications   Medication Sig Start Date End Date Taking? Authorizing Provider  amLODipine (NORVASC) 10 MG tablet Take 0.5 tablets (5 mg total) by mouth daily. 06/03/15  Yes Wanda Plump, MD  aspirin EC 325 MG EC tablet Take 1 tablet (325 mg total) by mouth daily with breakfast. 02/09/15  Yes Kathryne Hitch, MD  aspirin EC 81 MG tablet Take 81 mg by  mouth daily.   Yes Historical Provider, MD  atorvastatin (LIPITOR) 40 MG tablet Take 0.5 tablets (20 mg total) by mouth daily. 06/03/15  Yes Wanda Plump, MD  ciprofloxacin (CIPRO) 500 MG tablet Take 1 tablet (500 mg total) by mouth 2 (two) times daily. 06/03/15  Yes Wanda Plump, MD  diazepam (VALIUM) 5 MG tablet Take 2.5 mg by mouth daily as needed for anxiety (anxiety).   Yes Historical Provider, MD  gabapentin (NEURONTIN) 300 MG capsule Take 3 capsules (900 mg total) by mouth at bedtime. Or as directed Additional refills from PCP 06/03/15  Yes Wanda Plump, MD  HYDROcodone-acetaminophen [facility]) 10-325 MG tablet Take 1 tablet by mouth every 6 (six) hours as needed for moderate pain (for pain). 02/09/15  Yes Kathryne Hitch, MD  metoprolol (LOPRESSOR) 50 MG tablet Take 0.5 tablets (25 mg total) by mouth 2 (two) times daily. 06/03/15  Yes Wanda Plump, MD  omeprazole (PRILOSEC OTC) 20 MG tablet Take 1 tablet (20 mg total) by mouth daily. 06/03/15  Yes Wanda Plump, MD  nitroGLYCERIN (NITROSTAT) 0.4 MG SL tablet Place 0.4 mg under the tongue every 5 (five) minutes as needed (for chest pain).     Historical Provider, MD     Allergies  Allergen Reactions  . Percocet [Oxycodone-Acetaminophen] Rash  . Hydrochlorothiazide     Pt does not remember  . Indomethacin     Pt does not remember  . Paroxetine Hcl     Pt does not remember  . Penicillins     Has patient had a PCN reaction causing immediate rash, facial/tongue/throat swelling, SOB or lightheadedness with hypotension: Yes Has patient had a PCN reaction causing severe rash involving mucus membranes or skin necrosis: No Has patient had a PCN reaction that required hospitalization No Has patient had a PCN reaction occurring within the last 10 years: No If all of the above answers are "NO", then may proceed with Cephalosporin use.   . Pravachol     Pt does not remember  . Quinine Derivatives     Pt does not remember  . Wellbutrin [Bupropion]      agitation  . Zocor [Simvastatin - High Dose] Other (See Comments)    Weakness/no energy    Social History:  reports that she has quit smoking. Her smoking use included Cigarettes. She has a 30 pack-year smoking history. She has never used smokeless tobacco. She reports that she does not drink alcohol or use illicit drugs.    Family History  Problem Relation Age of Onset  . Heart attack Father   . Heart disease Father     before age 67  . Heart disease Sister   . Cancer Sister   . Heart disease Sister  Physical Exam:  GEN:  Pleasant Elderly well Nourished and Well Developed 76 y.o. Caucasian female examined and in no acute distress; cooperative with exam Filed Vitals:   06/04/15 1925 06/04/15 2027 06/04/15 2100 06/04/15 2130  BP: 91/62 118/50 103/55 119/59  Pulse: 86 73 75 78  Temp:      TempSrc:      Resp: 16 16 11 14   Height:      Weight:      SpO2: 92% 97% 97% 94%   Blood pressure 119/59, pulse 78, temperature 101.8 F (38.8 C), temperature source Rectal, resp. rate 14, height 5\' 5"  (1.651 m), weight 56.7 kg (125 lb), SpO2 94 %. PSYCH: She is alert and oriented x4; does not appear anxious does not appear depressed; affect is normal HEENT: Normocephalic and Atraumatic, Mucous membranes pink; PERRLA; EOM intact; Fundi:  Benign;  No scleral icterus, Nares: Patent, Oropharynx: Clear, Edentulous with Upper Denture Present,     Neck:  FROM, No Cervical Lymphadenopathy nor Thyromegaly or Carotid Bruit; No JVD; Breasts:: Not examined CHEST WALL: No tenderness CHEST: Normal respiration, clear to auscultation bilaterally HEART: Regular rate and rhythm; no murmurs rubs or gallops BACK: No kyphosis or scoliosis; No CVA tenderness ABDOMEN: Positive Bowel Sounds, Soft Non-Tender, No Rebound or Guarding; No Masses, No Organomegaly. Rectal Exam: Not done EXTREMITIES: No Cyanosis, Clubbing, or Edema; No Ulcerations. Genitalia: not examined PULSES: 2+ and symmetric SKIN: Normal  hydration no rash or ulceration CNS:  Alert and Oriented x 4, No Focal Deficits Vascular: pulses palpable throughout    Labs on Admission:  Basic Metabolic Panel:  Recent Labs Lab 06/04/15 1907  NA 143  K 3.1*  CL 111  CO2 20*  GLUCOSE 114*  BUN 22*  CREATININE 1.14*  CALCIUM 8.3*   Liver Function Tests:  Recent Labs Lab 06/04/15 1907  AST 307*  ALT 273*  ALKPHOS 194*  BILITOT 0.4  PROT 5.6*  ALBUMIN 2.8*   No results for input(s): LIPASE, AMYLASE in the last 168 hours. No results for input(s): AMMONIA in the last 168 hours. CBC:  Recent Labs Lab 06/04/15 1907  WBC 16.8*  NEUTROABS 15.2*  HGB 11.9*  HCT 37.8  MCV 96.2  PLT 199   Cardiac Enzymes: No results for input(s): CKTOTAL, CKMB, CKMBINDEX, TROPONINI in the last 168 hours.  BNP (last 3 results) No results for input(s): BNP in the last 8760 hours.  ProBNP (last 3 results) No results for input(s): PROBNP in the last 8760 hours.  CBG: No results for input(s): GLUCAP in the last 168 hours.  Radiological Exams on Admission: Dg Chest 1 View  06/04/2015  CLINICAL DATA:  Chest soreness, bronchitis EXAM: CHEST 1 VIEW COMPARISON:  02/07/2015 FINDINGS: The heart size and mediastinal contours are within normal limits. Both lungs are clear. The visualized skeletal structures are unremarkable. IMPRESSION: No active disease. Electronically Signed   By: Elige Ko   On: 06/04/2015 19:15   Dg Thoracic Spine W/swimmers  06/04/2015  CLINICAL DATA:  Left-sided thoracic back pain for a few months. No known injury. EXAM: THORACIC SPINE - 3 VIEWS COMPARISON:  AP chest radiograph 02/07/2015. PA and lateral chest radiographs 04/14/2012. FINDINGS: Spinal cord stimulator leads terminate at T7, unchanged. There is minimal S-shaped curvature of the thoracolumbar spine. There is no thoracic listhesis. Vertebral body heights are preserved. Mild disc space narrowing and endplate osteophyte formation are noted at T11-12, asymmetric  to the right. Limited visualization of the cervical spine demonstrates slight anterolisthesis of C4  on C5. Posterior lumbar fusion is partially visualized at L3-4. IMPRESSION: Mild thoracic disc degeneration, most notable at T11-12. Electronically Signed   By: Sebastian Ache M.D.   On: 06/04/2015 08:18   Ct Head Wo Contrast  06/04/2015  CLINICAL DATA:  UTI, weakness, possible fall EXAM: CT HEAD WITHOUT CONTRAST TECHNIQUE: Contiguous axial images were obtained from the base of the skull through the vertex without intravenous contrast. COMPARISON:  MRI brain dated 04/11/2016 FINDINGS: No evidence of parenchymal hemorrhage or extra-axial fluid collection. No mass lesion, mass effect, or midline shift. No CT evidence of acute infarction. Subcortical white matter and periventricular small vessel ischemic changes. , including in the subcortical left frontal lobe. Intracranial atherosclerosis. Global cortical atrophy.  No ventriculomegaly. The visualized paranasal sinuses are essentially clear. The mastoid air cells are unopacified. No evidence of calvarial fracture. IMPRESSION: No evidence of acute intracranial abnormality. Atrophy with small vessel ischemic changes. Electronically Signed   By: Charline Bills M.D.   On: 06/04/2015 19:08          Assessment/Plan:      75 y.o. female with  Principal Problem:    1.   Sepsis (HCC)- Source is the Urine    IV Levaquin and Aztreonam    IVFs    Urine C+S  pending   Active Problems:    2.         UTI (lower urinary tract infection)- same as #1      3.         Fall- due to #1 and #5         4.         AKI (acute kidney injury) (HCC)    IVFs     5.         Hypotension    IVFs    Monitor BPs    Hold Anti-Hypertensives           6.        Pre-syncope- due to #5    Cardiac Monitoring       7.         Hypokalemia    Replace K+    Check Magensium      8.   Elevated transaminase level    ABD CT scan ordered    Hold Atorvastatin Rx    Monitor  LFTs      9.   Hypertension    Hold Lopressor and Amlodipine while Hypotensive      10.   Coronary artery disease    stable           10 DVT Prophylaxis    Lovenox    Code Status:     FULL CODE     Family Communication:   Daughters at Bedside     Disposition Plan:    Inpatient Status        Time spent:  63 Minutes      Ron Parker [facility] Pager [phone]   If 7AM -7PM Please Contact the Day Rounding Team MD for [facility]  If 7PM-7AM, Please Contact Night-Floor Coverage  [email or web address] Password [facility] 06/04/2015, 9:41 PM     ADDENDUM:   Patient was seen and examined on 06/04/2015

## 2015-06-04 NOTE — ED Notes (Signed)
Bed: NF62 Expected date:  Expected time:  Means of arrival:  Comments: EMS- Fall- orthostatic, code sepsis

## 2015-06-04 NOTE — ED Notes (Signed)
MD at bedside. 

## 2015-06-04 NOTE — Progress Notes (Signed)
Pharmacy Antibiotic Note  Sara [name] is a 75 y.o. female admitted on 06/04/2015 with sepsis secondary to UTI.  Patient was seen by PCP yesterday and prescribed Cipro for UTI.  Pharmacy has been consulted for Aztreonam and Levaquin dosing.  AKI, CrCl~32 ml/min.  Aztreonam 2g and Levaquin 750 mg 1x doses given in ED.  Plan: Aztreonam 1g IV q8h. Levaquin 750 mg IV q48h dose for sepsis indication.   Height:  (165.1 cm) Weight: 125 lb (56.7 kg) IBW/kg (Calculated) : 57  Temp (24hrs), Avg:101.8 F (38.8 C), Min:101.8 F (38.8 C), Max:101.8 F (38.8 C)  No results for input(s): WBC, CREATININE, LATICACIDVEN, VANCOTROUGH, VANCOPEAK, VANCORANDOM, GENTTROUGH, GENTPEAK, GENTRANDOM, TOBRATROUGH, TOBRAPEAK, TOBRARND, AMIKACINPEAK, AMIKACINTROU, AMIKACIN in the last 168 hours.  CrCl cannot be calculated (Patient has no serum creatinine result on file.).    Allergies  Allergen Reactions  . Percocet [Oxycodone-Acetaminophen] Rash  . Hydrochlorothiazide     Pt does not remember  . Indomethacin     Pt does not remember  . Paroxetine Hcl     Pt does not remember  . Penicillins     Has patient had a PCN reaction causing immediate rash, facial/tongue/throat swelling, SOB or lightheadedness with hypotension: Yes Has patient had a PCN reaction causing severe rash involving mucus membranes or skin necrosis: No Has patient had a PCN reaction that required hospitalization No Has patient had a PCN reaction occurring within the last 10 years: No If all of the above answers are "NO", then may proceed with Cephalosporin use.   . Pravachol     Pt does not remember  . Quinine Derivatives     Pt does not remember  . Wellbutrin [Bupropion]     agitation  . Zocor [Simvastatin - High Dose] Other (See Comments)    Weakness/no energy    Antimicrobials this admission: 2/8 Aztreonam >>  2/8 Levaquin >>   Dose adjustments this admission: -  Microbiology results: 2/8 BCx: collected 2/8 UCx:  ordered  2/8 UA: positive for bacteria, WBC, nitrites   Thank you for allowing pharmacy to be a part of this patient's care.  Clance Boll 06/04/2015 6:36 PM

## 2015-06-04 NOTE — ED Notes (Signed)
Patient transported to CT 

## 2015-06-04 NOTE — ED Notes (Signed)
MD made aware of ISTAT

## 2015-06-04 NOTE — ED Provider Notes (Signed)
CSN: [id number]     Arrival date & time 06/04/15  1758 History   First MD Initiated Contact with Patient 06/04/15 1812     Chief Complaint  Patient presents with  . Fever  . Weakness  . Urinary Tract Infection  . Nausea  . Emesis   HPI Patient recently noticed urinary frequency or discolored urine and a foul odor to her urine. She went to her primary doctor yesterday and was diagnosed with urinary tract infection. She was given a prescription for antibiotics which she started last evening. When the patient woke up today she started to feel generalized weakness. She went to get out of bed this evening and was unable to stand and fell to the floor. It's unclear how long she lost consciousness for.  EMS was called. They noted that she had a temperature of 101.7. She was also given 750 mL of normal saline IV. Past Medical History  Diagnosis Date  . Mitral valve regurgitation     trace  . Hypercholesteremia   . Arthritis   . H/O hiatal hernia   . Stroke (HCC)   . Coronary artery disease     sees Dr. Patty Sermons  . Hypertension     all managed by Dr. Haynes Dage bill  . Anxiety   . Bronchitis     hx of  . GERD (gastroesophageal reflux disease)     hx of  . Carotid artery occlusion    Past Surgical History  Procedure Laterality Date  . Cardiac catheterization  1994  . Hysterotomy    . Carpal tunnel release      left  . Hand surgery      bilateral  . Cholecystectomy    . Tee without cardioversion  04/12/2012    Procedure: TRANSESOPHAGEAL ECHOCARDIOGRAM (TEE);  Surgeon: Lewayne Bunting, MD;  Location: [facility]COPY;  Service: Cardiovascular;  Laterality: N/A;  . Back surgery      x 3 Dr Juliene Pina, last @ [facility] ~ 2005, rods, fusion, four surgery  . Endarterectomy  04/24/2012    Procedure: ENDARTERECTOMY CAROTID;  Surgeon: Larina Earthly, MD;  Location: [facility] OR;  Service: Vascular;  Laterality: Left;  . Carotid endarterectomy    . Spinal cord stimulator insertion    . Hip pinning,cannulated  Left 02/08/2015    Procedure: CANNULATED HIP PINNING;  Surgeon: Kathryne Hitch, MD;  Location: [facility] ORS;  Service: Orthopedics;  Laterality: Left;  . Hip fracture surgery Left    Family History  Problem Relation Age of Onset  . Heart attack Father   . Heart disease Father     before age 59  . Heart disease Sister   . Cancer Sister   . Heart disease Sister    Social History  Substance Use Topics  . Smoking status: Former Smoker -- 0.50 packs/day for 60 years    Types: Cigarettes  . Smokeless tobacco: Never Used     Comment: quit 04-2015  . Alcohol Use: No   OB History    No data available     Review of Systems  All other systems reviewed and are negative.     Allergies  Percocet; Hydrochlorothiazide; Indomethacin; Paroxetine hcl; Penicillins; Pravachol; Quinine derivatives; Wellbutrin; and Zocor  Home Medications   Prior to Admission medications   Medication Sig Start Date End Date Taking? Authorizing Provider  amLODipine (NORVASC) 10 MG tablet Take 0.5 tablets (5 mg total) by mouth daily. 06/03/15  Yes Wanda Plump, MD  aspirin EC  325 MG EC tablet Take 1 tablet (325 mg total) by mouth daily with breakfast. 02/09/15  Yes Kathryne Hitch, MD  aspirin EC 81 MG tablet Take 81 mg by mouth daily.   Yes Historical Provider, MD  atorvastatin (LIPITOR) 40 MG tablet Take 0.5 tablets (20 mg total) by mouth daily. 06/03/15  Yes Wanda Plump, MD  ciprofloxacin (CIPRO) 500 MG tablet Take 1 tablet (500 mg total) by mouth 2 (two) times daily. 06/03/15  Yes Wanda Plump, MD  diazepam (VALIUM) 5 MG tablet Take 2.5 mg by mouth daily as needed for anxiety (anxiety).   Yes Historical Provider, MD  gabapentin (NEURONTIN) 300 MG capsule Take 3 capsules (900 mg total) by mouth at bedtime. Or as directed Additional refills from PCP 06/03/15  Yes Wanda Plump, MD  HYDROcodone-acetaminophen [facility]) 10-325 MG tablet Take 1 tablet by mouth every 6 (six) hours as needed for moderate pain (for pain).  02/09/15  Yes Kathryne Hitch, MD  metoprolol (LOPRESSOR) 50 MG tablet Take 0.5 tablets (25 mg total) by mouth 2 (two) times daily. 06/03/15  Yes Wanda Plump, MD  omeprazole (PRILOSEC OTC) 20 MG tablet Take 1 tablet (20 mg total) by mouth daily. 06/03/15  Yes Wanda Plump, MD  nitroGLYCERIN (NITROSTAT) 0.4 MG SL tablet Place 0.4 mg under the tongue every 5 (five) minutes as needed (for chest pain).     Historical Provider, MD   BP 103/55 mmHg  Pulse 75  Temp(Src) 101.8 F (38.8 C) (Rectal)  Resp 11  Ht 5\' 5"  (1.651 m)  Wt 56.7 kg  BMI 20.80 kg/m2  SpO2 97% Physical Exam  Constitutional: No distress.  HENT:  Head: Normocephalic and atraumatic.  Right Ear: External ear normal.  Left Ear: External ear normal.  Birthmark right forehead  Eyes: Conjunctivae are normal. Right eye exhibits no discharge. Left eye exhibits no discharge. No scleral icterus.  Neck: Neck supple. No tracheal deviation present.  Cardiovascular: Normal rate, regular rhythm and intact distal pulses.   Pulmonary/Chest: Effort normal and breath sounds normal. No stridor. No respiratory distress. She has no wheezes. She has no rales.  Abdominal: Soft. Bowel sounds are normal. She exhibits no distension. There is no tenderness. There is no rebound and no guarding.  Musculoskeletal: She exhibits no edema or tenderness.  No cervical thoracic or lumbar spine tenderness  Neurological: She is alert. No cranial nerve deficit (no facial droop, extraocular movements intact, no slurred speech) or sensory deficit. She exhibits normal muscle tone. She displays no seizure activity. Coordination normal.  Generalized weakness although no focal deficits, patient is able to lift both arms and legs off the bed  Skin: Skin is warm and dry. No rash noted. She is not diaphoretic.  Psychiatric: She has a normal mood and affect.  Nursing note and vitals reviewed.   ED Course  Procedures (including critical care time)  CRITICAL  CARE Performed by: WUJWJ,XBJ Total critical care time: 35 minutes Critical care time was exclusive of separately billable procedures and treating other patients. Critical care was necessary to treat or prevent imminent or life-threatening deterioration. Critical care was time spent personally by me on the following activities: development of treatment plan with patient and/or surrogate as well as nursing, discussions with consultants, evaluation of patient's response to treatment, examination of patient, obtaining history from patient or surrogate, ordering and performing treatments and interventions, ordering and review of laboratory studies, ordering and review of radiographic studies, pulse oximetry and re-evaluation  of patient's condition.  Labs Review Labs Reviewed  COMPREHENSIVE METABOLIC PANEL - Abnormal; Notable for the following:    Potassium 3.1 (*)    CO2 20 (*)    Glucose, Bld 114 (*)    BUN 22 (*)    Creatinine, Ser 1.14 (*)    Calcium 8.3 (*)    Total Protein 5.6 (*)    Albumin 2.8 (*)    AST 307 (*)    ALT 273 (*)    Alkaline Phosphatase 194 (*)    GFR calc non Af Amer 45 (*)    GFR calc Af Amer 52 (*)    All other components within normal limits  CBC WITH DIFFERENTIAL/PLATELET - Abnormal; Notable for the following:    WBC 16.8 (*)    Hemoglobin 11.9 (*)    Neutro Abs 15.2 (*)    All other components within normal limits  URINALYSIS, ROUTINE W REFLEX MICROSCOPIC (NOT AT [facility]) - Abnormal; Notable for the following:    Color, Urine AMBER (*)    APPearance CLOUDY (*)    Hgb urine dipstick LARGE (*)    Protein, ur 30 (*)    Leukocytes, UA TRACE (*)    All other components within normal limits  URINE MICROSCOPIC-ADD ON - Abnormal; Notable for the following:    Squamous Epithelial / LPF 0-5 (*)    Bacteria, UA MANY (*)    All other components within normal limits  I-STAT CG4 LACTIC ACID, ED - Abnormal; Notable for the following:    Lactic Acid, Venous 3.84 (*)     All other components within normal limits  CULTURE, BLOOD (ROUTINE X 2)  CULTURE, BLOOD (ROUTINE X 2)  URINE CULTURE  I-STAT TROPOININ, ED  I-STAT CG4 LACTIC ACID, ED    Imaging Review Dg Chest 1 View  06/04/2015  CLINICAL DATA:  Chest soreness, bronchitis EXAM: CHEST 1 VIEW COMPARISON:  02/07/2015 FINDINGS: The heart size and mediastinal contours are within normal limits. Both lungs are clear. The visualized skeletal structures are unremarkable. IMPRESSION: No active disease. Electronically Signed   By: Elige Ko   On: 06/04/2015 19:15   Dg Thoracic Spine W/swimmers  06/04/2015  CLINICAL DATA:  Left-sided thoracic back pain for a few months. No known injury. EXAM: THORACIC SPINE - 3 VIEWS COMPARISON:  AP chest radiograph 02/07/2015. PA and lateral chest radiographs 04/14/2012. FINDINGS: Spinal cord stimulator leads terminate at T7, unchanged. There is minimal S-shaped curvature of the thoracolumbar spine. There is no thoracic listhesis. Vertebral body heights are preserved. Mild disc space narrowing and endplate osteophyte formation are noted at T11-12, asymmetric to the right. Limited visualization of the cervical spine demonstrates slight anterolisthesis of C4 on C5. Posterior lumbar fusion is partially visualized at L3-4. IMPRESSION: Mild thoracic disc degeneration, most notable at T11-12. Electronically Signed   By: Sebastian Ache M.D.   On: 06/04/2015 08:18   Ct Head Wo Contrast  06/04/2015  CLINICAL DATA:  UTI, weakness, possible fall EXAM: CT HEAD WITHOUT CONTRAST TECHNIQUE: Contiguous axial images were obtained from the base of the skull through the vertex without intravenous contrast. COMPARISON:  MRI brain dated 04/11/2016 FINDINGS: No evidence of parenchymal hemorrhage or extra-axial fluid collection. No mass lesion, mass effect, or midline shift. No CT evidence of acute infarction. Subcortical white matter and periventricular small vessel ischemic changes. , including in the subcortical  left frontal lobe. Intracranial atherosclerosis. Global cortical atrophy.  No ventriculomegaly. The visualized paranasal sinuses are essentially clear. The mastoid air  cells are unopacified. No evidence of calvarial fracture. IMPRESSION: No evidence of acute intracranial abnormality. Atrophy with small vessel ischemic changes. Electronically Signed   By: Charline Bills M.D.   On: 06/04/2015 19:08   I have personally reviewed and evaluated these images and lab results as part of my medical decision-making.   EKG Interpretation   Date/Time:  Wednesday June 04 2015 18:40:36 EST Ventricular Rate:  85 PR Interval:  163 QRS Duration: 89 QT Interval:  397 QTC Calculation: 472 R Axis:   64 Text Interpretation:  Sinus rhythm No significant change since last  tracing Confirmed by Bryahna Lesko  MD-J, Zayven Powe ([id number]) on 06/04/2015 9:04:04 PM      MDM   Final diagnoses:  Sepsis, due to unspecified organism [facility])  UTI (lower urinary tract infection)  Elevated LFTs    Patient presented to the emergency room with weakness and fever associated with a recent diagnosis of a urinary tract infection. Patient's symptoms are concerning for sepsis.  Laboratory tests confirm a urinary tract infection. Patient also has renal insufficiency associated with leukocytosis and elevated lactic acid level.  She also has new elevated LFTs. Not certain if this is related to her infection versus some other primary liver or gallbladder issue. She does not have any abdominal tenderness. I have ordered an abdominal pelvic CT to evaluate that further.   Patient was started on IV antibiotics and was given a fluid bolus. Blood pressures improved to 103/55. She is not demonstrating signs of severe sepsis or septic shock.  Plan on admission to the hospital for further treatment.   Linwood Dibbles, MD 06/04/15 2114

## 2015-06-05 DIAGNOSIS — N179 Acute kidney failure, unspecified: Secondary | ICD-10-CM

## 2015-06-05 DIAGNOSIS — A4151 Sepsis due to Escherichia coli [E. coli]: Secondary | ICD-10-CM

## 2015-06-05 DIAGNOSIS — R740 Nonspecific elevation of levels of transaminase and lactic acid dehydrogenase [LDH]: Secondary | ICD-10-CM

## 2015-06-05 DIAGNOSIS — E440 Moderate protein-calorie malnutrition: Secondary | ICD-10-CM | POA: Insufficient documentation

## 2015-06-05 LAB — CBC
HEMATOCRIT: 33.7 % — AB (ref 36.0–46.0)
Hemoglobin: 10.7 g/dL — ABNORMAL LOW (ref 12.0–15.0)
MCH: 30.5 pg (ref 26.0–34.0)
MCHC: 31.8 g/dL (ref 30.0–36.0)
MCV: 96 fL (ref 78.0–100.0)
PLATELETS: 169 10*3/uL (ref 150–400)
RBC: 3.51 MIL/uL — AB (ref 3.87–5.11)
RDW: 14.5 % (ref 11.5–15.5)
WBC: 25 10*3/uL — AB (ref 4.0–10.5)

## 2015-06-05 LAB — BASIC METABOLIC PANEL
Anion gap: 10 (ref 5–15)
BUN: 20 mg/dL (ref 6–20)
CO2: 21 mmol/L — ABNORMAL LOW (ref 22–32)
Calcium: 7.7 mg/dL — ABNORMAL LOW (ref 8.9–10.3)
Chloride: 113 mmol/L — ABNORMAL HIGH (ref 101–111)
Creatinine, Ser: 0.93 mg/dL (ref 0.44–1.00)
GFR, EST NON AFRICAN AMERICAN: 58 mL/min — AB (ref >60)
Glucose, Bld: 118 mg/dL — ABNORMAL HIGH (ref 65–99)
POTASSIUM: 3.3 mmol/L — AB (ref 3.5–5.1)
SODIUM: 144 mmol/L (ref 135–145)

## 2015-06-05 LAB — URINE CULTURE: Colony Count: >=100000

## 2015-06-05 LAB — LACTIC ACID, PLASMA
LACTIC ACID, VENOUS: 1.8 mmol/L (ref 0.5–2.0)
Lactic Acid, Venous: 2.9 mmol/L (ref 0.5–2.0)

## 2015-06-05 LAB — PROTIME-INR
INR: 1.37 (ref 0.00–1.49)
Prothrombin Time: 16.4 seconds — ABNORMAL HIGH (ref 11.6–15.2)

## 2015-06-05 LAB — PROCALCITONIN: Procalcitonin: 36.88 ng/mL

## 2015-06-05 LAB — APTT: APTT: 33 s (ref 24–37)

## 2015-06-05 MED ORDER — HYDROMORPHONE HCL 1 MG/ML IJ SOLN
0.5000 mg | INTRAMUSCULAR | Status: DC | PRN
  Administered 2015-06-05 – 2015-06-06 (×3): 0.5 mg via INTRAVENOUS
  Filled 2015-06-05 (×4): qty 1

## 2015-06-05 MED ORDER — SODIUM CHLORIDE 0.9 % IV SOLN
INTRAVENOUS | Status: DC
  Administered 2015-06-05: 12:00:00 via INTRAVENOUS

## 2015-06-05 MED ORDER — BOOST / RESOURCE BREEZE PO LIQD
1.0000 | ORAL | Status: DC
  Administered 2015-06-06 – 2015-06-07 (×2): 1 via ORAL

## 2015-06-05 MED ORDER — SODIUM CHLORIDE 0.9 % IV BOLUS (SEPSIS)
500.0000 mL | Freq: Once | INTRAVENOUS | Status: AC
  Administered 2015-06-05: 500 mL via INTRAVENOUS

## 2015-06-05 MED ORDER — ENOXAPARIN SODIUM 40 MG/0.4ML ~~LOC~~ SOLN
40.0000 mg | Freq: Every day | SUBCUTANEOUS | Status: DC
  Administered 2015-06-05 – 2015-06-06 (×2): 40 mg via SUBCUTANEOUS
  Filled 2015-06-05 (×2): qty 0.4

## 2015-06-05 MED ORDER — POTASSIUM CHLORIDE CRYS ER 20 MEQ PO TBCR
40.0000 meq | EXTENDED_RELEASE_TABLET | Freq: Once | ORAL | Status: AC
  Administered 2015-06-05: 40 meq via ORAL
  Filled 2015-06-05: qty 2

## 2015-06-05 NOTE — Progress Notes (Signed)
CRITICAL VALUE ALERT  Critical value received: Lactic Acid 2.9  Date of notification:  06/05/15  Time of notification: 0125  Critical value read back:yes  Nurse who received alert: Audria Nine  MD notified (1st page): yes  Time of first page:  0134

## 2015-06-05 NOTE — Evaluation (Signed)
Physical Therapy Evaluation Patient Details Name: Sara [name] MRN: [id number] DOB: 07/25/1940 Today's Date: 06/05/2015   History of Present Illness  Sara [name] is a 75 y.o. female with a history of a previous CVA, CAD, HTN, hip fracturen10/16.    EMS was called and found to have hypotension.fever and had a fall in the home. In the ED,  elevated Lactic Acid level  and a UTI. Admitted with sepsis.  Clinical Impression  Patient  Is participatory to ambulate in the hall with RW. Appears weak, was independent and drove PTA. Patient will benefit from PT to address problems listed in the note below to DC to home.    Follow Up Recommendations No PT follow up (patient declines, has family available.)    Equipment Recommendations  None recommended by PT    Recommendations for Other Services       Precautions / Restrictions Precautions Precautions: Fall Restrictions Weight Bearing Restrictions: No      Mobility  Bed Mobility Overal bed mobility: Needs Assistance Bed Mobility: Rolling;Sidelying to Sit Rolling: Supervision Sidelying to sit: Min assist       General bed mobility comments: assist with trunk to fully sit upright., able to get legs back into bed.  Transfers Overall transfer level: Needs assistance Equipment used: Rolling walker (2 wheeled) Transfers: Sit to/from Stand Sit to Stand: Min assist         General transfer comment: a little boost to stand up  Ambulation/Gait Ambulation/Gait assistance: Min assist Ambulation Distance (Feet): 110 Feet Assistive device: Rolling walker (2 wheeled) Gait Pattern/deviations: Step-through pattern     General Gait Details: gait is steady  and slow with RW, ambulated x 6' with no RW, 1 HHA.  Stairs            Wheelchair Mobility    Modified Rankin (Stroke Patients Only)       Balance Overall balance assessment: History of Falls;Needs assistance Sitting-balance support: Feet supported;No upper extremity  supported Sitting balance-Leahy Scale: Good     Standing balance support: During functional activity;No upper extremity supported Standing balance-Leahy Scale: Fair                               Pertinent Vitals/Pain Pain Assessment: No/denies pain    Home Living Family/patient expects to be discharged to:: Private residence Living Arrangements: Spouse/significant other Available Help at Discharge: Family;Available 24 hours/day Type of Home: House Home Access: Stairs to enter Entrance Stairs-Rails: Doctor, general practice of Steps: 1+1+1 Home Layout: One level Home Equipment: Walker - 2 wheels;Walker - 4 wheels;Shower seat;Hand held shower head      Prior Function Level of Independence: Independent         Comments: drives     Hand Dominance        Extremity/Trunk Assessment   Upper Extremity Assessment: Generalized weakness           Lower Extremity Assessment: Generalized weakness      Cervical / Trunk Assessment: Normal  Communication   Communication: No difficulties  Cognition Arousal/Alertness: Awake/alert Behavior During Therapy: WFL for tasks assessed/performed Overall Cognitive Status: Within Functional Limits for tasks assessed                      General Comments      Exercises        Assessment/Plan    PT Assessment Patient needs continued PT services  PT  Diagnosis Difficulty walking;Generalized weakness   PT Problem List Decreased strength;Decreased activity tolerance;Decreased mobility  PT Treatment Interventions DME instruction;Gait training;Functional mobility training;Therapeutic activities;Patient/family education   PT Goals (Current goals can be found in the Care Plan section) Acute Rehab PT Goals Patient Stated Goal: to go home when I feel better. PT Goal Formulation: With patient/family Time For Goal Achievement: 06/19/15 Potential to Achieve Goals: Good    Frequency Min 3X/week    Barriers to discharge        Co-evaluation               End of Session Equipment Utilized During Treatment: Gait belt Activity Tolerance: Patient tolerated treatment well;Patient limited by fatigue Patient left: in bed;with call bell/phone within reach;with bed alarm set;with family/visitor present Nurse Communication: Mobility status         Time: 1610-9604 PT Time Calculation (min) (ACUTE ONLY): 16 min   Charges:   PT Evaluation $PT Eval Low Complexity: 1 Procedure     PT G CodesRada Hay 06/05/2015, 3:03 PM Blanchard Kelch PT [phone]

## 2015-06-05 NOTE — Care Management Note (Signed)
Case Management Note  Patient Details  Name: Sara [name] MRN: [id number] Date of Birth: [birth year]  Subjective/Objective:              75 yo admitted with Sepsis      Action/Plan: From home with family.  Expected Discharge Date:   (unknown)               Expected Discharge Plan:  Home/Self Care  In-House Referral:     Discharge planning Services  CM Consult  Post Acute Care Choice:    Choice offered to:     DME Arranged:    DME Agency:     HH Arranged:    HH Agency:     Status of Service:  In process, will continue to follow  Medicare Important Message Given:    Date Medicare IM Given:    Medicare IM give by:    Date Additional Medicare IM Given:    Additional Medicare Important Message give by:     If discussed at Long Length of Stay Meetings, dates discussed:    Additional Comments: Chart reviewed and no CM needs identified or communicated at this time. CM will continue to follow. Sandford Craze RN,BSN,NCM [phone] Bartholome Bill, RN 06/05/2015, 3:07 PM

## 2015-06-05 NOTE — Progress Notes (Signed)
Initial Nutrition Assessment  DOCUMENTATION CODES:   Non-severe (moderate) malnutrition in context of chronic illness  INTERVENTION:  - Will order Boost Breeze once/day, this supplement provides 250 kcal and 9 grams of protein - Snacks to be brought in by family members - RD will continue to monitor for needs  NUTRITION DIAGNOSIS:   Inadequate oral intake related to poor appetite as evidenced by per patient/family report.  GOAL:   Patient will meet greater than or equal to 90% of their needs  MONITOR:   PO intake, Supplement acceptance, Weight trends, Labs, I & O's  REASON FOR ASSESSMENT:   Malnutrition Screening Tool  ASSESSMENT:   75 y.o. female with a history of a previous CVA, CAD, HTN who had been seen by her PCP yesterday and diagnosed with a UTI and placed on Cipro. She had taken her first dose last night and a second dose this AM. In the afternoon, she suffered a fall in her house, and had increased weakness. She had Fevers and Chills and a temperature of 101.7. Per family who is at the bedside she had almost fainted. EMS was called and she was found to have hypotension, and was given a bolus of IV fluids on route. In the ED, she was further evaluated and found to have an elevated Lactic Acid level of 3.84, and a UTI, and she was placed on IV Levaquin and Aztreonam for Sepsis and UTI.  Pt seen for MST. BMI indicates normal weight. Pt reports that she was unable to eat yesterday due to acute illness. She ate a half of a [place] sandwich and fruit cocktail in the ED last night around 2330. For breakfast this AM she had 75-100% fruit cocktail, orange juice, and [place] toast. Pt denies abdominal pain or nausea with this meal. She has hx of CVA and denies residual weakness or difficulties with chewing or swallowing related to this. Pt was not drinking nutrition supplements such as Ensure or Boost PTA. Talked with her about Boost Breeze and she is willing to try it; will  order once/day to trial. Pt states that for the past >/= 6 months she has had fluctuating appetite which is usually fair.   Due to this she has been losing weight and daughter, who is at bedside, reports that pt has lost ~20 lbs in the past 6 months. This would equate to ~13% body weight) in 6 months which is significant for time frame. Mild muscle and fat wasting noted to upper body, lower body not assessed at this time, during physical assessment.  Family asks about appropriate snacks for pt on Heart Healthy diet as they would like to bring items in for pt. Discussed this with them. Pt states that she likes trail mix; encouraged this due to protein content of nuts and family plans to bring this to pt today.   Likely not fully meeting needs PTA. Will continue to monitor for additional needs. Medications reviewed. Labs reviewed; K: 3.3 mmol/L, Cl: 113 mmol/L, Ca: 7.7 mg/dL. GFR: 58.   Diet Order:  Diet Heart Room service appropriate?: Yes; Fluid consistency:: Thin  Skin:  Reviewed, no issues  Last BM:  2/8  Height:   Ht Readings from Last 1 Encounters:  06/04/15  (1.651 m)    Weight:   Wt Readings from Last 1 Encounters:  06/04/15 129 lb 3.2 oz (58.605 kg)    Ideal Body Weight:  56.82 kg (kg)  BMI:  Body mass index is 21.5 kg/(m^2).  Estimated  Nutritional Needs:   Kcal:  1500-1700  Protein:  60-70 grams  Fluid:  1.8-2 L/day  EDUCATION NEEDS:   No education needs identified at this time     Trenton Gammon, RD, LDN Inpatient Clinical Dietitian Pager # [phone] After hours/weekend pager # [phone]

## 2015-06-05 NOTE — Progress Notes (Signed)
PVR showing 73 ml. No complaints from patient. MD notified to be made aware.

## 2015-06-05 NOTE — Progress Notes (Signed)
PROGRESS NOTE  Sara [name] ZOX:[id number] DOB: 1941-04-21 DOA: 06/04/2015 PCP: [name], MD  Assessment/Plan: Sepsis [facility])- Source is the Urine- most likely pyelonephritis-- > 100,000 ecoli IV Levaquin and Aztreonam IVFs Urine C+S pending  Fall- PT eval  AKI (acute kidney injury) (HCC) IVFs  Hypotension IVFs Monitor BPs Hold Anti-Hypertensives  Pre-syncope- due to #5 Cardiac Monitoring  Hypokalemia Replace K+  Elevated transaminase level ABD CT scan ordered- multiple results: will need outpatient follow up for abdominal aortic anyesym Hold Atorvastatin Rx Monitor LFTs  Hypertension -hold Lopressor and Amlodipine while Hypotensive  Coronary artery disease stable    Code Status: full Family Communication:patient/family at bedside Disposition Plan:    Consultants:    Procedures:     HPI/Subjective: Feeling some better Still dizzy when gets up  Objective: Filed Vitals:   06/05/15 0505 06/05/15 1018  BP: 133/59 104/55  Pulse: 85 75  Temp: 98.3 F (36.8 C) 97.6 F (36.4 C)  Resp: 18 18    Intake/Output Summary (Last 24 hours) at 06/05/15 1401 Last data filed at 06/05/15 1137  Gross per 24 hour  Intake 1527.5 ml  Output   1300 ml  Net  227.5 ml   Filed Weights   06/04/15 1834 06/04/15 2222  Weight: 56.7 kg (125 lb) 58.605 kg (129 lb 3.2 oz)    Exam:   General: awake, NAD- bruise on right side of face  Cardiovascular: rrr  Respiratory: clear  Abdomen: +BS, soft-- no tender  Musculoskeletal: no edema   Data Reviewed: Basic Metabolic Panel:  Recent Labs Lab 06/04/15 1907 06/05/15 0200  NA 143 144  K 3.1* 3.3*  CL 111 113*  CO2 20* 21*  GLUCOSE 114* 118*  BUN 22* 20  CREATININE 1.14* 0.93  CALCIUM 8.3* 7.7*   Liver Function Tests:  Recent Labs Lab 06/04/15 1907  AST 307*  ALT 273*  ALKPHOS 194*  BILITOT 0.4  PROT 5.6*  ALBUMIN 2.8*   No results for  input(s): LIPASE, AMYLASE in the last 168 hours. No results for input(s): AMMONIA in the last 168 hours. CBC:  Recent Labs Lab 06/04/15 1907 06/05/15 0200  WBC 16.8* 25.0*  NEUTROABS 15.2*  --   HGB 11.9* 10.7*  HCT 37.8 33.7*  MCV 96.2 96.0  PLT 199 169   Cardiac Enzymes: No results for input(s): CKTOTAL, CKMB, CKMBINDEX, TROPONINI in the last 168 hours. BNP (last 3 results) No results for input(s): BNP in the last 8760 hours.  ProBNP (last 3 results) No results for input(s): PROBNP in the last 8760 hours.  CBG:  Recent Labs Lab 06/04/15 2254  GLUCAP 94    Recent Results (from the past 240 hour(s))  Urine Culture     Status: None (Preliminary result)   Collection Time: 06/03/15  4:16 PM  Result Value Ref Range Status   Colony Count >=100,000 COLONIES/ML  Preliminary   Preliminary Report ESCHERICHIA COLI  Preliminary     Studies: Dg Chest 1 View  06/04/2015  CLINICAL DATA:  Chest soreness, bronchitis EXAM: CHEST 1 VIEW COMPARISON:  02/07/2015 FINDINGS: The heart size and mediastinal contours are within normal limits. Both lungs are clear. The visualized skeletal structures are unremarkable. IMPRESSION: No active disease. Electronically Signed   By: Elige Ko   On: 06/04/2015 19:15   Dg Thoracic Spine W/swimmers  06/04/2015  CLINICAL DATA:  Left-sided thoracic back pain for a few months. No known injury. EXAM: THORACIC SPINE - 3 VIEWS COMPARISON:  AP chest radiograph 02/07/2015. PA  and lateral chest radiographs 04/14/2012. FINDINGS: Spinal cord stimulator leads terminate at T7, unchanged. There is minimal S-shaped curvature of the thoracolumbar spine. There is no thoracic listhesis. Vertebral body heights are preserved. Mild disc space narrowing and endplate osteophyte formation are noted at T11-12, asymmetric to the right. Limited visualization of the cervical spine demonstrates slight anterolisthesis of C4 on C5. Posterior lumbar fusion is partially visualized at L3-4.  IMPRESSION: Mild thoracic disc degeneration, most notable at T11-12. Electronically Signed   By: Sebastian Ache M.D.   On: 06/04/2015 08:18   Ct Head Wo Contrast  06/04/2015  CLINICAL DATA:  UTI, weakness, possible fall EXAM: CT HEAD WITHOUT CONTRAST TECHNIQUE: Contiguous axial images were obtained from the base of the skull through the vertex without intravenous contrast. COMPARISON:  MRI brain dated 04/11/2016 FINDINGS: No evidence of parenchymal hemorrhage or extra-axial fluid collection. No mass lesion, mass effect, or midline shift. No CT evidence of acute infarction. Subcortical white matter and periventricular small vessel ischemic changes. , including in the subcortical left frontal lobe. Intracranial atherosclerosis. Global cortical atrophy.  No ventriculomegaly. The visualized paranasal sinuses are essentially clear. The mastoid air cells are unopacified. No evidence of calvarial fracture. IMPRESSION: No evidence of acute intracranial abnormality. Atrophy with small vessel ischemic changes. Electronically Signed   By: Charline Bills M.D.   On: 06/04/2015 19:08   Ct Abdomen Pelvis W Contrast  06/04/2015  CLINICAL DATA:  Acute onset of generalized abdominal pain and orthostatic hypotension. Elevated lactic acid. Generalized weakness. Initial encounter. EXAM: CT ABDOMEN AND PELVIS WITH CONTRAST TECHNIQUE: Multidetector CT imaging of the abdomen and pelvis was performed using the standard protocol following bolus administration of intravenous contrast. CONTRAST:  80mL OMNIPAQUE IOHEXOL 300 MG/ML  SOLN COMPARISON:  MRI of the lumbar spine performed 09/24/2013 FINDINGS: Minimal bibasilar atelectasis is noted. Diffuse coronary artery calcifications are seen. Mitral valve calcification is noted. A tiny hiatal hernia is noted. The liver and spleen are unremarkable in appearance. The gallbladder is within normal limits. The pancreas and left adrenal gland are unremarkable. A 1.3 cm hypodensity is noted within  the right adrenal gland, nonspecific in appearance. A mildly complex 4.3 cm isodense cyst is noted at the lower pole of the left kidney, with minimal layering calcification. This is most likely benign. Additional scattered bilateral renal cysts are seen. Mild left renal atrophy and scarring are seen. There is prominence of both ureters, more prominent on the right, with mild right-sided hydronephrosis and minimal left-sided hydronephrosis. No distal obstructing stone is seen, though there is mildly increased distal right ureteral wall enhancement, which may reflect right-sided ureteritis. Underlying bilateral perinephric stranding is noted. Nonobstructing right renal stones measure up to 5 mm in size. No free fluid is identified. The small bowel is unremarkable in appearance. The stomach is within normal limits. No acute vascular abnormalities are seen. 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 Aneurysmal dilatation arises just distal to the renal arteries, and resolves just above the aortic bifurcation. Diffuse calcification is noted along the abdominal aorta and its branches, with associated mural thrombus but no significant luminal narrowing. The appendix is not well characterized; there is no evidence of appendicitis. The colon is partially filled with liquid stool, and is unremarkable in appearance. A metallic device is noted at the right flank. The bladder is moderately distended and grossly unremarkable in appearance. The patient is status post hysterectomy. No suspicious adnexal masses are identified. The left ovary  is grossly unremarkable in appearance. No inguinal lymphadenopathy is seen. No acute osseous abnormalities are identified. Left femoral screws are grossly unremarkable in appearance. The patient is status post lumbar spinal fusion at L3-L5, with mild associated degenerative change. IMPRESSION: 1. Mild right-sided hydronephrosis and minimal  left-sided hydronephrosis, which may reflect chronic distention of the bladder. Mildly increased distal right ureteral wall enhancement may reflect right-sided ureteritis. 2. Mildly complex 4.3 cm isodense cyst at the lower pole of the left kidney, with minimal layering calcification. Given its appearance, this most likely benign. Additional scattered bilateral renal cysts noted. Mild left renal atrophy and scarring seen. 3. Nonobstructing right renal stones measure up to 5 mm in size. 4. 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 Aneurysmal dilatation arises just distal to the renal arteries, and resolves just above the aortic bifurcation. Diffuse calcification along the abdominal aorta and its branches, with associated mural thrombus but no significant luminal narrowing. Recommend followup by ultrasound in 2 years. This recommendation follows ACR consensus guidelines: White Paper of the ACR Incidental Findings Committee II on Vascular Findings. J Am Coll Radiol 2013; 10:789-794. 5. Tiny hiatal hernia noted. 6. Diffuse coronary artery calcifications seen. 7. 1.3 cm hypodensity within the right adrenal gland, nonspecific in appearance. This could be further assessed on adrenal protocol MRI or CT, on an elective nonemergent basis. Electronically Signed   By: Roanna Raider M.D.   On: 06/04/2015 22:27    Scheduled Meds: . aspirin EC  325 mg Oral Q breakfast  . aztreonam  1 g Intravenous 3 times per day  . enoxaparin (LOVENOX) injection  40 mg Subcutaneous QHS  . feeding supplement  1 Container Oral Q24H  . gabapentin  900 mg Oral QHS  . [START ON 06/06/2015] levofloxacin (LEVAQUIN) IV  750 mg Intravenous Q48H  . omeprazole  20 mg Oral Daily  . sodium chloride flush  3 mL Intravenous Q12H   Continuous Infusions: . sodium chloride 75 mL/hr at 06/05/15 1225   Antibiotics Given (last 72 hours)    Date/Time Action Medication Dose Rate   06/05/15 0527 Given    aztreonam (AZACTAM) 1 g in dextrose 5 % 50 mL IVPB 1 g 100 mL/hr   06/05/15 1327 Given   aztreonam (AZACTAM) 1 g in dextrose 5 % 50 mL IVPB 1 g 100 mL/hr      Principal Problem:   Sepsis (HCC) Active Problems:   Hypertension   Coronary artery disease   UTI (lower urinary tract infection)   Fall   Elevated transaminase level   AKI (acute kidney injury) (HCC)   Pre-syncope   Malnutrition of moderate degree    Time spent: 25 min    Kaleeya Hancock U [facility]  [facility] Pager [phone]. If 7PM-7AM, please contact night-coverage at [email or web address], password [facility] 06/05/2015, 2:01 PM  LOS: 1 day

## 2015-06-06 DIAGNOSIS — N390 Urinary tract infection, site not specified: Secondary | ICD-10-CM

## 2015-06-06 LAB — CBC
HCT: 34.9 % — ABNORMAL LOW (ref 36.0–46.0)
Hemoglobin: 11.1 g/dL — ABNORMAL LOW (ref 12.0–15.0)
MCH: 30.5 pg (ref 26.0–34.0)
MCHC: 31.8 g/dL (ref 30.0–36.0)
MCV: 95.9 fL (ref 78.0–100.0)
PLATELETS: 189 10*3/uL (ref 150–400)
RBC: 3.64 MIL/uL — AB (ref 3.87–5.11)
RDW: 14.7 % (ref 11.5–15.5)
WBC: 19.3 10*3/uL — AB (ref 4.0–10.5)

## 2015-06-06 LAB — COMPREHENSIVE METABOLIC PANEL
ALBUMIN: 2.3 g/dL — AB (ref 3.5–5.0)
ALT: 123 U/L — AB (ref 14–54)
AST: 51 U/L — AB (ref 15–41)
Alkaline Phosphatase: 132 U/L — ABNORMAL HIGH (ref 38–126)
Anion gap: 6 (ref 5–15)
BUN: 13 mg/dL (ref 6–20)
CHLORIDE: 118 mmol/L — AB (ref 101–111)
CO2: 23 mmol/L (ref 22–32)
Calcium: 8.4 mg/dL — ABNORMAL LOW (ref 8.9–10.3)
Creatinine, Ser: 0.6 mg/dL (ref 0.44–1.00)
GFR calc Af Amer: >60 mL/min (ref >60)
GFR calc non Af Amer: >60 mL/min (ref >60)
Glucose, Bld: 109 mg/dL — ABNORMAL HIGH (ref 65–99)
POTASSIUM: 3.7 mmol/L (ref 3.5–5.1)
SODIUM: 147 mmol/L — AB (ref 135–145)
TOTAL PROTEIN: 4.9 g/dL — AB (ref 6.5–8.1)
Total Bilirubin: 0.4 mg/dL (ref 0.3–1.2)

## 2015-06-06 LAB — URINE CULTURE: CULTURE: NO GROWTH

## 2015-06-06 MED ORDER — AMLODIPINE BESYLATE 5 MG PO TABS
5.0000 mg | ORAL_TABLET | Freq: Every day | ORAL | Status: DC
  Administered 2015-06-06 – 2015-06-07 (×2): 5 mg via ORAL
  Filled 2015-06-06 (×2): qty 1

## 2015-06-06 MED ORDER — METOCLOPRAMIDE HCL 5 MG/ML IJ SOLN
10.0000 mg | Freq: Once | INTRAMUSCULAR | Status: AC
  Administered 2015-06-06: 10 mg via INTRAVENOUS
  Filled 2015-06-06: qty 2

## 2015-06-06 MED ORDER — METOPROLOL TARTRATE 25 MG PO TABS
25.0000 mg | ORAL_TABLET | Freq: Two times a day (BID) | ORAL | Status: DC
  Administered 2015-06-06 – 2015-06-07 (×3): 25 mg via ORAL
  Filled 2015-06-06 (×3): qty 1

## 2015-06-06 MED ORDER — KETOROLAC TROMETHAMINE 15 MG/ML IJ SOLN
15.0000 mg | Freq: Once | INTRAMUSCULAR | Status: AC
  Administered 2015-06-06: 15 mg via INTRAVENOUS
  Filled 2015-06-06: qty 1

## 2015-06-06 MED ORDER — DIPHENHYDRAMINE HCL 50 MG/ML IJ SOLN
25.0000 mg | Freq: Once | INTRAMUSCULAR | Status: AC
  Administered 2015-06-06: 25 mg via INTRAVENOUS
  Filled 2015-06-06: qty 1

## 2015-06-06 MED ORDER — LEVOFLOXACIN IN D5W 750 MG/150ML IV SOLN
750.0000 mg | INTRAVENOUS | Status: DC
  Administered 2015-06-06: 750 mg via INTRAVENOUS
  Filled 2015-06-06 (×2): qty 150

## 2015-06-06 MED ORDER — TRAMADOL HCL 50 MG PO TABS
50.0000 mg | ORAL_TABLET | Freq: Four times a day (QID) | ORAL | Status: DC | PRN
Start: 2015-06-06 — End: 2015-06-07
  Administered 2015-06-06 (×2): 50 mg via ORAL
  Filled 2015-06-06 (×2): qty 1

## 2015-06-06 NOTE — Progress Notes (Signed)
Resumed care of patient at this time. Report received from C. Sullivan, RN. Will continue to monitor pt closely and carry out plan of care. Asaro, Nusaiba Guallpa I   

## 2015-06-06 NOTE — Progress Notes (Addendum)
Pharmacy Antibiotic Note  Sara [name] is a 75 y.o. female admitted on 06/04/2015 with sepsis secondary to UTI.  Patient was seen by PCP 1 day PTA and prescribed Cipro for UTI.  Pharmacy consulted for Aztreonam and Levaquin dosing.  Urine culture results available today showing E.coli sensitive to Levaquin so aztreonam has been discontinued.  AKI resolved, CrCl~53 ml/min.  WBC remains elevated.  Plan: Adjust Levaquin to 750 mg IV q24h.  Height:  (165.1 cm) Weight: 129 lb 3.2 oz (58.605 kg) IBW/kg (Calculated) : 57  Temp (24hrs), Avg:97.8 F (36.6 C), Min:97.6 F (36.4 C), Max:98.2 F (36.8 C)   Recent Labs Lab 06/04/15 1856 06/04/15 1907 06/04/15 2149 06/05/15 0030 06/05/15 0156 06/05/15 0200 06/06/15 0457  WBC  --  16.8*  --   --   --  25.0* 19.3*  CREATININE  --  1.14*  --   --   --  0.93 0.60  LATICACIDVEN 3.84*  --  2.75* 2.9* 1.8  --   --     Estimated Creatinine Clearance: 53 mL/min (by C-G formula based on Cr of 0.6).    Allergies  Allergen Reactions  . Percocet [Oxycodone-Acetaminophen] Rash  . Hydrochlorothiazide     Pt does not remember  . Indomethacin     Pt does not remember  . Paroxetine Hcl     Pt does not remember  . Penicillins     Has patient had a PCN reaction causing immediate rash, facial/tongue/throat swelling, SOB or lightheadedness with hypotension: Yes Has patient had a PCN reaction causing severe rash involving mucus membranes or skin necrosis: No Has patient had a PCN reaction that required hospitalization No Has patient had a PCN reaction occurring within the last 10 years: No If all of the above answers are "NO", then may proceed with Cephalosporin use.   . Pravachol     Pt does not remember  . Quinine Derivatives     Pt does not remember  . Wellbutrin [Bupropion]     agitation  . Zocor [Simvastatin - High Dose] Other (See Comments)    Weakness/no energy    Antimicrobials this admission: 2/8 Aztreonam >>  2/10 2/8 Levaquin  >>   Dose adjustments this admission: 2/10 adjust Levaquin from q48h to q24h for improved SCr  Microbiology results: 2/7 UCx: E.coli (R-amp, Augmentin; I-Unasyn, S-rest of panel) 2/8 BCx: collected 2/8 UCx: collected   2/8 UA: positive for bacteria, WBC, nitrites   Thank you for allowing pharmacy to be a part of this patient's care.  Clance Boll 06/06/2015 9:43 AM

## 2015-06-06 NOTE — Progress Notes (Signed)
PROGRESS NOTE  Sara [name] JXB:[id number] DOB: 1941/03/31 DOA: 06/04/2015 PCP: [name], MD  Assessment/Plan: Sepsis [facility])- Source is the Urine- most likely pyelonephritis-- > 100,000 ecoli IV Levaquin : d/c azactam IVFs  Fall- PT eval  AKI (acute kidney injury) (HCC) IVFs  Hypotension IVFs Monitor BPs Hold Anti-Hypertensives  Pre-syncope- due to #5 Cardiac Monitoring  Hypokalemia Replace K+  Elevated transaminase level ABD CT scan ordered- multiple results: will need outpatient follow up for abdominal aortic anuerysm Hold Atorvastatin Rx Monitor LFTs  Hypertension -resume home meds  Coronary artery disease stable  Headache -reglan and toradol x 1  Hypernatremia -encourage PO water intake  Code Status: full Family Communication:patient/family at bedside Disposition Plan: ? Home in AM   Consultants:    Procedures:     HPI/Subjective: C/o headache  Objective: Filed Vitals:   06/06/15 0522 06/06/15 1200  BP: 139/59 157/102  Pulse: 76   Temp: 98.2 F (36.8 C)   Resp: 18     Intake/Output Summary (Last 24 hours) at 06/06/15 1308 Last data filed at 06/06/15 1244  Gross per 24 hour  Intake 933.75 ml  Output   1000 ml  Net -66.25 ml   Filed Weights   06/04/15 1834 06/04/15 2222  Weight: 56.7 kg (125 lb) 58.605 kg (129 lb 3.2 oz)    Exam:   General: awake, NAD- bruise on right side of face  Cardiovascular: rrr  Respiratory: clear  Abdomen: +BS, soft-- no tender  Musculoskeletal: no edema   Data Reviewed: Basic Metabolic Panel:  Recent Labs Lab 06/04/15 1907 06/05/15 0200 06/06/15 0457  NA 143 144 147*  K 3.1* 3.3* 3.7  CL 111 113* 118*  CO2 20* 21* 23  GLUCOSE 114* 118* 109*  BUN 22* 20 13  CREATININE 1.14* 0.93 0.60  CALCIUM 8.3* 7.7* 8.4*   Liver Function Tests:  Recent Labs Lab 06/04/15 1907 06/06/15 0457  AST 307* 51*  ALT 273* 123*  ALKPHOS 194*  132*  BILITOT 0.4 0.4  PROT 5.6* 4.9*  ALBUMIN 2.8* 2.3*   No results for input(s): LIPASE, AMYLASE in the last 168 hours. No results for input(s): AMMONIA in the last 168 hours. CBC:  Recent Labs Lab 06/04/15 1907 06/05/15 0200 06/06/15 0457  WBC 16.8* 25.0* 19.3*  NEUTROABS 15.2*  --   --   HGB 11.9* 10.7* 11.1*  HCT 37.8 33.7* 34.9*  MCV 96.2 96.0 95.9  PLT 199 169 189   Cardiac Enzymes: No results for input(s): CKTOTAL, CKMB, CKMBINDEX, TROPONINI in the last 168 hours. BNP (last 3 results) No results for input(s): BNP in the last 8760 hours.  ProBNP (last 3 results) No results for input(s): PROBNP in the last 8760 hours.  CBG:  Recent Labs Lab 06/04/15 2254  GLUCAP 94    Recent Results (from the past 240 hour(s))  Urine Culture     Status: None   Collection Time: 06/03/15  4:16 PM  Result Value Ref Range Status   Culture ESCHERICHIA COLI  Final   Colony Count >=100,000 COLONIES/ML  Final   Organism ID, Bacteria ESCHERICHIA COLI  Final      Susceptibility   Escherichia coli -  (no method available)    AMPICILLIN >=32 Resistant     AMOX/CLAVULANIC >=32 Resistant     AMPICILLIN/SULBACTAM 16 Intermediate     PIP/TAZO <=4 Sensitive     IMIPENEM <=0.25 Sensitive     CEFAZOLIN 8 Resistant     CEFTRIAXONE <=1 Sensitive  CEFTAZIDIME <=1 Sensitive     CEFEPIME <=1 Sensitive     GENTAMICIN <=1 Sensitive     TOBRAMYCIN <=1 Sensitive     CIPROFLOXACIN <=0.25 Sensitive     LEVOFLOXACIN <=0.12 Sensitive     NITROFURANTOIN <=16 Sensitive     TRIMETH/SULFA* <=20 Sensitive      * NR=NOT REPORTABLE,SEE COMMENTORAL therapy:A cefazolin MIC of <32 predicts susceptibility to the oral agents cefaclor,cefdinir,cefpodoxime,cefprozil,cefuroxime,cephalexin,and loracarbef when used for therapy of uncomplicated UTIs due to E.coli,K.pneumomiae,and P.mirabilis. PARENTERAL therapy: A cefazolinMIC of >8 indicates resistance to parenteralcefazolin. An alternate test method must  beperformed to confirm susceptibility to parenteralcefazolin.     Studies: Dg Chest 1 View  06/04/2015  CLINICAL DATA:  Chest soreness, bronchitis EXAM: CHEST 1 VIEW COMPARISON:  02/07/2015 FINDINGS: The heart size and mediastinal contours are within normal limits. Both lungs are clear. The visualized skeletal structures are unremarkable. IMPRESSION: No active disease. Electronically Signed   By: Elige Ko   On: 06/04/2015 19:15   Ct Head Wo Contrast  06/04/2015  CLINICAL DATA:  UTI, weakness, possible fall EXAM: CT HEAD WITHOUT CONTRAST TECHNIQUE: Contiguous axial images were obtained from the base of the skull through the vertex without intravenous contrast. COMPARISON:  MRI brain dated 04/11/2016 FINDINGS: No evidence of parenchymal hemorrhage or extra-axial fluid collection. No mass lesion, mass effect, or midline shift. No CT evidence of acute infarction. Subcortical white matter and periventricular small vessel ischemic changes. , including in the subcortical left frontal lobe. Intracranial atherosclerosis. Global cortical atrophy.  No ventriculomegaly. The visualized paranasal sinuses are essentially clear. The mastoid air cells are unopacified. No evidence of calvarial fracture. IMPRESSION: No evidence of acute intracranial abnormality. Atrophy with small vessel ischemic changes. Electronically Signed   By: Charline Bills M.D.   On: 06/04/2015 19:08   Ct Abdomen Pelvis W Contrast  06/04/2015  CLINICAL DATA:  Acute onset of generalized abdominal pain and orthostatic hypotension. Elevated lactic acid. Generalized weakness. Initial encounter. EXAM: CT ABDOMEN AND PELVIS WITH CONTRAST TECHNIQUE: Multidetector CT imaging of the abdomen and pelvis was performed using the standard protocol following bolus administration of intravenous contrast. CONTRAST:  80mL OMNIPAQUE IOHEXOL 300 MG/ML  SOLN COMPARISON:  MRI of the lumbar spine performed 09/24/2013 FINDINGS: Minimal bibasilar atelectasis is noted.  Diffuse coronary artery calcifications are seen. Mitral valve calcification is noted. A tiny hiatal hernia is noted. The liver and spleen are unremarkable in appearance. The gallbladder is within normal limits. The pancreas and left adrenal gland are unremarkable. A 1.3 cm hypodensity is noted within the right adrenal gland, nonspecific in appearance. A mildly complex 4.3 cm isodense cyst is noted at the lower pole of the left kidney, with minimal layering calcification. This is most likely benign. Additional scattered bilateral renal cysts are seen. Mild left renal atrophy and scarring are seen. There is prominence of both ureters, more prominent on the right, with mild right-sided hydronephrosis and minimal left-sided hydronephrosis. No distal obstructing stone is seen, though there is mildly increased distal right ureteral wall enhancement, which may reflect right-sided ureteritis. Underlying bilateral perinephric stranding is noted. Nonobstructing right renal stones measure up to 5 mm in size. No free fluid is identified. The small bowel is unremarkable in appearance. The stomach is within normal limits. No acute vascular abnormalities are seen. 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 Aneurysmal dilatation arises just distal to the renal arteries, and resolves just above the aortic bifurcation. Diffuse calcification is  noted along the abdominal aorta and its branches, with associated mural thrombus but no significant luminal narrowing. The appendix is not well characterized; there is no evidence of appendicitis. The colon is partially filled with liquid stool, and is unremarkable in appearance. A metallic device is noted at the right flank. The bladder is moderately distended and grossly unremarkable in appearance. The patient is status post hysterectomy. No suspicious adnexal masses are identified. The left ovary is grossly unremarkable in appearance. No  inguinal lymphadenopathy is seen. No acute osseous abnormalities are identified. Left femoral screws are grossly unremarkable in appearance. The patient is status post lumbar spinal fusion at L3-L5, with mild associated degenerative change. IMPRESSION: 1. Mild right-sided hydronephrosis and minimal left-sided hydronephrosis, which may reflect chronic distention of the bladder. Mildly increased distal right ureteral wall enhancement may reflect right-sided ureteritis. 2. Mildly complex 4.3 cm isodense cyst at the lower pole of the left kidney, with minimal layering calcification. Given its appearance, this most likely benign. Additional scattered bilateral renal cysts noted. Mild left renal atrophy and scarring seen. 3. Nonobstructing right renal stones measure up to 5 mm in size. 4. 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 Aneurysmal dilatation arises just distal to the renal arteries, and resolves just above the aortic bifurcation. Diffuse calcification along the abdominal aorta and its branches, with associated mural thrombus but no significant luminal narrowing. Recommend followup by ultrasound in 2 years. This recommendation follows ACR consensus guidelines: White Paper of the ACR Incidental Findings Committee II on Vascular Findings. J Am Coll Radiol 2013; 10:789-794. 5. Tiny hiatal hernia noted. 6. Diffuse coronary artery calcifications seen. 7. 1.3 cm hypodensity within the right adrenal gland, nonspecific in appearance. This could be further assessed on adrenal protocol MRI or CT, on an elective nonemergent basis. Electronically Signed   By: Roanna Raider M.D.   On: 06/04/2015 22:27    Scheduled Meds: . aspirin EC  325 mg Oral Q breakfast  . enoxaparin (LOVENOX) injection  40 mg Subcutaneous QHS  . feeding supplement  1 Container Oral Q24H  . gabapentin  900 mg Oral QHS  . levofloxacin (LEVAQUIN) IV  750 mg Intravenous Q24H  . omeprazole  20 mg Oral Daily    . sodium chloride flush  3 mL Intravenous Q12H   Continuous Infusions:   Antibiotics Given (last 72 hours)    Date/Time Action Medication Dose Rate   06/05/15 0527 Given   aztreonam (AZACTAM) 1 g in dextrose 5 % 50 mL IVPB 1 g 100 mL/hr   06/05/15 1327 Given   aztreonam (AZACTAM) 1 g in dextrose 5 % 50 mL IVPB 1 g 100 mL/hr   06/05/15 2241 Given   aztreonam (AZACTAM) 1 g in dextrose 5 % 50 mL IVPB 1 g 100 mL/hr   06/06/15 1478 Given   aztreonam (AZACTAM) 1 g in dextrose 5 % 50 mL IVPB 1 g 100 mL/hr   06/06/15 1244 Given   levofloxacin (LEVAQUIN) IVPB 750 mg 750 mg 100 mL/hr      Principal Problem:   Sepsis (HCC) Active Problems:   Hypertension   Coronary artery disease   UTI (lower urinary tract infection)   Fall   Elevated transaminase level   AKI (acute kidney injury) (HCC)   Pre-syncope   Malnutrition of moderate degree    Time spent: 25 min    JESSICA U [facility]  [facility] Pager [phone]. If 7PM-7AM, please contact night-coverage at [email or web address], password [facility]  06/06/2015, 1:08 PM  LOS: 2 days

## 2015-06-06 NOTE — Progress Notes (Signed)
Resumed care of patient. Agree with previous assessment. Will continue to monitor. 

## 2015-06-07 LAB — BASIC METABOLIC PANEL
Anion gap: 8 (ref 5–15)
BUN: 10 mg/dL (ref 6–20)
CALCIUM: 8.3 mg/dL — AB (ref 8.9–10.3)
CO2: 26 mmol/L (ref 22–32)
CREATININE: 0.63 mg/dL (ref 0.44–1.00)
Chloride: 108 mmol/L (ref 101–111)
Glucose, Bld: 101 mg/dL — ABNORMAL HIGH (ref 65–99)
Potassium: 3.1 mmol/L — ABNORMAL LOW (ref 3.5–5.1)
SODIUM: 142 mmol/L (ref 135–145)

## 2015-06-07 LAB — CBC
HCT: 36.9 % (ref 36.0–46.0)
Hemoglobin: 12.2 g/dL (ref 12.0–15.0)
MCH: 30.8 pg (ref 26.0–34.0)
MCHC: 33.1 g/dL (ref 30.0–36.0)
MCV: 93.2 fL (ref 78.0–100.0)
Platelets: 198 10*3/uL (ref 150–400)
RBC: 3.96 MIL/uL (ref 3.87–5.11)
RDW: 14.2 % (ref 11.5–15.5)
WBC: 11.4 10*3/uL — ABNORMAL HIGH (ref 4.0–10.5)

## 2015-06-07 MED ORDER — LEVOFLOXACIN 500 MG PO TABS
500.0000 mg | ORAL_TABLET | Freq: Every day | ORAL | Status: DC
  Administered 2015-06-07: 500 mg via ORAL
  Filled 2015-06-07: qty 1

## 2015-06-07 MED ORDER — POTASSIUM CHLORIDE CRYS ER 20 MEQ PO TBCR
40.0000 meq | EXTENDED_RELEASE_TABLET | Freq: Once | ORAL | Status: AC
  Administered 2015-06-07: 40 meq via ORAL
  Filled 2015-06-07: qty 2

## 2015-06-07 MED ORDER — LEVOFLOXACIN 500 MG PO TABS
500.0000 mg | ORAL_TABLET | Freq: Every day | ORAL | Status: DC
Start: 2015-06-07 — End: 2015-06-20

## 2015-06-07 MED ORDER — BOOST / RESOURCE BREEZE PO LIQD: 1.0000 | ORAL | Status: DC

## 2015-06-07 MED ORDER — HYDROCODONE-ACETAMINOPHEN 10-325 MG PO TABS
1.0000 | ORAL_TABLET | Freq: Four times a day (QID) | ORAL | Status: DC | PRN
  Administered 2015-06-07: 1 via ORAL
  Filled 2015-06-07: qty 1

## 2015-06-07 NOTE — Progress Notes (Signed)
Patient and her daughter given discharge, medication and follow up instructions, verbalized understanding, IVs and telemetry box removed, family to transport home

## 2015-06-07 NOTE — Discharge Summary (Signed)
Physician Discharge Summary  Sara [name] ZOX:[id number] DOB: May 12, 1940 DOA: 06/04/2015  PCP: [name], MD  Admit date: 06/04/2015 Discharge date: 06/07/2015  Time spent: 35 minutes  Recommendations for Outpatient Follow-up:  1. Adrenal protocol CT Scan as outpatient for adrenal follow up on CT 2. ?outpatient referral to urology for chronic bladder distention--- PVR was 73 in hospital 3. Abdominal aorta U/S in 2 years per radiology recommendation for surveillance 4. Have stopped norvasc-- evaluate BP at next visit to see if it is needed   Discharge Diagnoses:  Principal Problem:   Sepsis [facility]) Active Problems:   Hypertension   Coronary artery disease   UTI (lower urinary tract infection)   Fall   Elevated transaminase level   AKI (acute kidney injury) (HCC)   Pre-syncope   Malnutrition of moderate degree   Discharge Condition: improved  Diet recommendation: cardiac  Filed Weights   06/04/15 1834 06/04/15 2222  Weight: 56.7 kg (125 lb) 58.605 kg (129 lb 3.2 oz)    History of present illness:  Sara [name] is a 75 y.o. female with a history of a previous CVA, CAD, HTN who had been seen by her PCP yesterday and diagnosed with a UTI and placed on Cipro. She had taken her first dose last night and a second dose this AM. In the afternoon, she suffered a fall in her house, and had increased weakness. She had Fevers and Chills and a temperature of 101.7. Per family who is at the bedside she had almost fainted. EMS was called and she was found to have hypotension, and was given a bolus of IV fluids on route. In the ED, she was further evaluated and found to have an elevated Lactic Acid level of 3.84, and a UTI, and she was placed on IV Levaquin and Aztreonam for Sepsis and UTI. Her blood pressures improved with IVFs, and she was referred for admission.  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, as well as CT Scan with adrenal protocol Monitor LFTs=- declining due to hypotension  Hypertension -resume home meds minus norvasc  Coronary artery disease stable  Headache -reglan and toradol x 1-- resolved  Hypernatremia -encourage PO water intake  Procedures:    Consultations:    Discharge Exam: Filed Vitals:   06/07/15 0440 06/07/15 0924  BP: 148/63 119/73  Pulse: 66 71  Temp: 98.2 F (36.8 C) 97.6 F (36.4 C)  Resp: 18 16    General: awake-- feeling much better and anxious to go home   Discharge Instructions   Discharge Instructions    Diet - low sodium heart healthy    Complete by:  As directed      Discharge instructions    Complete by:  As directed   Return to ER with fevers Please drink plenty of fluid daily     Increase activity slowly    Complete by:  As directed           Current Discharge Medication List    START taking these medications  Details  feeding supplement (BOOST / RESOURCE BREEZE) LIQD Take 1 Container by mouth daily. Refills: 0    levofloxacin (LEVAQUIN) 500 MG tablet Take 1 tablet (500 mg total) by mouth daily. Qty: 7 tablet, Refills: 0      CONTINUE these medications which have NOT CHANGED   Details  aspirin EC 325 MG EC tablet Take 1 tablet (325 mg total) by mouth daily with breakfast. Qty: 30 tablet, Refills: 0    atorvastatin (LIPITOR) 40 MG tablet Take 0.5 tablets (20 mg total) by mouth daily. Qty: 15 tablet, Refills: 3    diazepam  (VALIUM) 5 MG tablet Take 2.5 mg by mouth daily as needed for anxiety (anxiety).    gabapentin (NEURONTIN) 300 MG capsule Take 3 capsules (900 mg total) by mouth at bedtime. Or as directed Additional refills from PCP Qty: 270 capsule, Refills: 0    HYDROcodone-acetaminophen (NORCO) 10-325 MG tablet Take 1 tablet by mouth every 6 (six) hours as needed for moderate pain (for pain). Qty: 60 tablet, Refills: 0    metoprolol (LOPRESSOR) 50 MG tablet Take 0.5 tablets (25 mg total) by mouth 2 (two) times daily. Qty: 30 tablet, Refills: 3    omeprazole (PRILOSEC OTC) 20 MG tablet Take 1 tablet (20 mg total) by mouth daily. Qty: 30 tablet, Refills: 3      STOP taking these medications     amLODipine (NORVASC) 10 MG tablet      ciprofloxacin (CIPRO) 500 MG tablet      nitroGLYCERIN (NITROSTAT) 0.4 MG SL tablet        Allergies  Allergen Reactions  . Percocet [Oxycodone-Acetaminophen] Rash  . Hydrochlorothiazide     Pt does not remember  . Indomethacin     Pt does not remember  . Paroxetine Hcl     Pt does not remember  . Penicillins     Has patient had a PCN reaction causing immediate rash, facial/tongue/throat swelling, SOB or lightheadedness with hypotension: Yes Has patient had a PCN reaction causing severe rash involving mucus membranes or skin necrosis: No Has patient had a PCN reaction that required hospitalization No Has patient had a PCN reaction occurring within the last 10 years: No If all of the above answers are "NO", then may proceed with Cephalosporin use.   . Pravachol     Pt does not remember  . Quinine Derivatives     Pt does not remember  . Wellbutrin [Bupropion]     agitation  . Zocor [Simvastatin - High Dose] Other (See Comments)    Weakness/no energy   Follow-up Information    Follow up with [name], MD In 1 week.   Specialty:  Internal Medicine   Contact information:   [address] [place] [address] [phone]        The  results of significant diagnostics from this hospitalization (including imaging, microbiology, ancillary and laboratory) are listed below for reference.    Significant Diagnostic Studies: Dg Chest 1 View  06/04/2015  CLINICAL DATA:  Chest soreness, bronchitis EXAM: CHEST 1 VIEW COMPARISON:  02/07/2015 FINDINGS: The heart size and mediastinal contours are within normal limits. Both lungs are clear. The visualized skeletal structures are unremarkable. IMPRESSION: No active disease. Electronically Signed   By: Elige Ko   On: 06/04/2015 19:15   Dg Thoracic Spine W/swimmers  06/04/2015  CLINICAL DATA:  Left-sided thoracic back pain for a few months. No known injury. EXAM: THORACIC SPINE - 3 VIEWS COMPARISON:  AP chest radiograph 02/07/2015. PA and lateral chest radiographs 04/14/2012. FINDINGS: Spinal cord stimulator leads terminate at T7, unchanged. There is minimal S-shaped curvature of the thoracolumbar spine. There is no thoracic listhesis. Vertebral body heights are preserved. Mild disc space narrowing and endplate osteophyte formation are noted at T11-12, asymmetric to the right. Limited visualization of the cervical spine demonstrates slight anterolisthesis of C4 on C5. Posterior lumbar fusion is partially visualized at L3-4. IMPRESSION: Mild thoracic disc degeneration, most notable at T11-12. Electronically Signed   By: Sebastian Ache M.D.   On: 06/04/2015 08:18   Ct Head Wo Contrast  06/04/2015  CLINICAL DATA:  UTI, weakness, possible fall EXAM: CT HEAD WITHOUT CONTRAST TECHNIQUE: Contiguous axial images were obtained from the base of the skull through the vertex without intravenous contrast. COMPARISON:  MRI brain dated 04/11/2016 FINDINGS: No evidence of parenchymal hemorrhage or extra-axial fluid collection. No mass lesion, mass effect, or midline shift. No CT evidence of acute infarction. Subcortical white matter and periventricular small vessel ischemic changes. , including in the subcortical left  frontal lobe. Intracranial atherosclerosis. Global cortical atrophy.  No ventriculomegaly. The visualized paranasal sinuses are essentially clear. The mastoid air cells are unopacified. No evidence of calvarial fracture. IMPRESSION: No evidence of acute intracranial abnormality. Atrophy with small vessel ischemic changes. Electronically Signed   By: Charline Bills M.D.   On: 06/04/2015 19:08   Ct Abdomen Pelvis W Contrast  06/04/2015  CLINICAL DATA:  Acute onset of generalized abdominal pain and orthostatic hypotension. Elevated lactic acid. Generalized weakness. Initial encounter. EXAM: CT ABDOMEN AND PELVIS WITH CONTRAST TECHNIQUE: Multidetector CT imaging of the abdomen and pelvis was performed using the standard protocol following bolus administration of intravenous contrast. CONTRAST:  80mL OMNIPAQUE IOHEXOL 300 MG/ML  SOLN COMPARISON:  MRI of the lumbar spine performed 09/24/2013 FINDINGS: Minimal bibasilar atelectasis is noted. Diffuse coronary artery calcifications are seen. Mitral valve calcification is noted. A tiny hiatal hernia is noted. The liver and spleen are unremarkable in appearance. The gallbladder is within normal limits. The pancreas and left adrenal gland are unremarkable. A 1.3 cm hypodensity is noted within the right adrenal gland, nonspecific in appearance. A mildly complex 4.3 cm isodense cyst is noted at the lower pole of the left kidney, with minimal layering calcification. This is most likely benign. Additional scattered bilateral renal cysts are seen. Mild left renal atrophy and scarring are seen. There is prominence of both ureters, more prominent on the right, with mild right-sided hydronephrosis and minimal left-sided hydronephrosis. No distal obstructing stone is seen, though there is mildly increased distal right ureteral wall enhancement, which may reflect right-sided ureteritis. Underlying bilateral perinephric stranding is noted. Nonobstructing right renal stones measure up  to 5 mm in size. No free fluid is identified. The small bowel is unremarkable in appearance. The stomach is within normal limits. No acute vascular abnormalities are seen. 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 Aneurysmal dilatation arises just distal to the renal arteries, and resolves just above the aortic bifurcation. Diffuse calcification is noted along the abdominal aorta and its branches, with associated mural thrombus but no significant luminal narrowing. The appendix is not well characterized; there is no evidence of appendicitis. The colon is partially filled with liquid stool, and is unremarkable in appearance. A metallic device is noted at the right flank. The bladder is moderately distended and grossly unremarkable in appearance. The patient is status post hysterectomy. No suspicious adnexal masses  are identified. The left ovary is grossly unremarkable in appearance. No inguinal lymphadenopathy is seen. No acute osseous abnormalities are identified. Left femoral screws are grossly unremarkable in appearance. The patient is status post lumbar spinal fusion at L3-L5, with mild associated degenerative change. IMPRESSION: 1. Mild right-sided hydronephrosis and minimal left-sided hydronephrosis, which may reflect chronic distention of the bladder. Mildly increased distal right ureteral wall enhancement may reflect right-sided ureteritis. 2. Mildly complex 4.3 cm isodense cyst at the lower pole of the left kidney, with minimal layering calcification. Given its appearance, this most likely benign. Additional scattered bilateral renal cysts noted. Mild left renal atrophy and scarring seen. 3. Nonobstructing right renal stones measure up to 5 mm in size. 4. 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 Aneurysmal dilatation arises just distal to the renal arteries, and resolves just above the aortic bifurcation. Diffuse  calcification along the abdominal aorta and its branches, with associated mural thrombus but no significant luminal narrowing. Recommend followup by ultrasound in 2 years. This recommendation follows ACR consensus guidelines: White Paper of the ACR Incidental Findings Committee II on Vascular Findings. J Am Coll Radiol 2013; 10:789-794. 5. Tiny hiatal hernia noted. 6. Diffuse coronary artery calcifications seen. 7. 1.3 cm hypodensity within the right adrenal gland, nonspecific in appearance. This could be further assessed on adrenal protocol MRI or CT, on an elective nonemergent basis. Electronically Signed   By: Roanna Raider M.D.   On: 06/04/2015 22:27    Microbiology: Recent Results (from the past 240 hour(s))  Urine Culture     Status: None   Collection Time: 06/03/15  4:16 PM  Result Value Ref Range Status   Culture ESCHERICHIA COLI  Final   Colony Count >=100,000 COLONIES/ML  Final   Organism ID, Bacteria ESCHERICHIA COLI  Final      Susceptibility   Escherichia coli -  (no method available)    AMPICILLIN >=32 Resistant     AMOX/CLAVULANIC >=32 Resistant     AMPICILLIN/SULBACTAM 16 Intermediate     PIP/TAZO <=4 Sensitive     IMIPENEM <=0.25 Sensitive     CEFAZOLIN 8 Resistant     CEFTRIAXONE <=1 Sensitive     CEFTAZIDIME <=1 Sensitive     CEFEPIME <=1 Sensitive     GENTAMICIN <=1 Sensitive     TOBRAMYCIN <=1 Sensitive     CIPROFLOXACIN <=0.25 Sensitive     LEVOFLOXACIN <=0.12 Sensitive     NITROFURANTOIN <=16 Sensitive     TRIMETH/SULFA* <=20 Sensitive      * NR=NOT REPORTABLE,SEE COMMENTORAL therapy:A cefazolin MIC of <32 predicts susceptibility to the oral agents cefaclor,cefdinir,cefpodoxime,cefprozil,cefuroxime,cephalexin,and loracarbef when used for therapy of uncomplicated UTIs due to E.coli,K.pneumomiae,and P.mirabilis. PARENTERAL therapy: A cefazolinMIC of >8 indicates resistance to parenteralcefazolin. An alternate test method must beperformed to confirm susceptibility to  parenteralcefazolin.  Blood Culture (routine x 2)     Status: None (Preliminary result)   Collection Time: 06/04/15  6:43 PM  Result Value Ref Range Status   Specimen Description BLOOD LEFT HAND  Final   Special Requests BOTTLES DRAWN AEROBIC AND ANAEROBIC 5CC EACH  Final   Culture   Final    NO GROWTH 3 DAYS Performed at [facility]    Report Status PENDING  Incomplete  Blood Culture (routine x 2)     Status: None (Preliminary result)   Collection Time: 06/04/15  6:45 PM  Result Value Ref Range Status   Specimen Description BLOOD LEFT ARM  Final   Special Requests BOTTLES DRAWN AEROBIC AND ANAEROBIC 5CC EACH  Final   Culture   Final    NO GROWTH 3 DAYS Performed at [facility]    Report Status PENDING  Incomplete  Urine culture     Status: None   Collection Time: 06/04/15  8:19 PM  Result Value Ref Range Status   Specimen Description URINE, CATHETERIZED  Final   Special Requests NONE  Final   Culture   Final    NO GROWTH 2 DAYS Performed at [facility]    Report Status 06/06/2015 FINAL  Final     Labs: Basic Metabolic Panel:  Recent Labs Lab 06/04/15 1907 06/05/15 0200 06/06/15 0457 06/07/15 0519  NA 143 144 147* 142  K 3.1* 3.3* 3.7 3.1*  CL 111 113* 118* 108  CO2 20* 21* 23 26  GLUCOSE 114* 118* 109* 101*  BUN 22* CREATININE 1.14* 0.93 0.60 0.63  CALCIUM 8.3* 7.7* 8.4* 8.3*   Liver Function Tests:  Recent Labs Lab 06/04/15 1907 06/06/15 0457  AST 307* 51*  ALT 273* 123*  ALKPHOS 194* 132*  BILITOT 0.4 0.4  PROT 5.6* 4.9*  ALBUMIN 2.8* 2.3*   No results for input(s): LIPASE, AMYLASE in the last 168 hours. No results for input(s): AMMONIA in the last 168 hours. CBC:  Recent Labs Lab 06/04/15 1907 06/05/15 0200 06/06/15 0457 06/07/15 0519  WBC 16.8* 25.0* 19.3* 11.4*  NEUTROABS 15.2*  --   --   --   HGB 11.9* 10.7* 11.1* 12.2  HCT 37.8 33.7* 34.9* 36.9  MCV 96.2 96.0 95.9 93.2  PLT 199 169 189 198    Cardiac Enzymes: No results for input(s): CKTOTAL, CKMB, CKMBINDEX, TROPONINI in the last 168 hours. BNP: BNP (last 3 results) No results for input(s): BNP in the last 8760 hours.  ProBNP (last 3 results) No results for input(s): PROBNP in the last 8760 hours.  CBG:  Recent Labs Lab 06/04/15 2254  GLUCAP 94       Signed:  Jasmine Mcbeth U Luther Newhouse DO.  [facility] 06/07/2015, 9:47 AM

## 2015-06-07 NOTE — Care Management Important Message (Signed)
Important Message  Patient Details  Name: Sara [name] MRN: [id number] Date of Birth: 1941-03-12   Medicare Important Message Given:  Yes    Elliot Cousin, RN 06/07/2015, 12:38 PM

## 2015-06-09 ENCOUNTER — Telehealth: Payer: Self-pay | Admitting: *Deleted

## 2015-06-09 LAB — CULTURE, BLOOD (ROUTINE X 2)
CULTURE: NO GROWTH
Culture: NO GROWTH

## 2015-06-09 NOTE — Telephone Encounter (Signed)
Please schedule pt for hospital f/u w/ Dr. Drue Novel in approx 1 week. Pt's appts need to be scheduled after 2pm d/t transportation arrangements.

## 2015-06-09 NOTE — Telephone Encounter (Signed)
Patient scheduled for 06/20/15 d/t transportation request.

## 2015-06-09 NOTE — Telephone Encounter (Signed)
Transition Care Management Follow-up Telephone Call   Date discharged? 06/07/15  Admit date: 06/04/2015 Discharge date: 06/07/2015  Recommendations for Outpatient Follow-up:  2. Adrenal protocol CT Scan as outpatient for adrenal follow up on CT 3. ?outpatient referral to urology for chronic bladder distention--- PVR was 73 in hospital 4. Abdominal aorta U/S in 2 years per radiology recommendation for surveillance 5. Have stopped norvasc-- evaluate BP at next visit to see if it is needed   How have you been since you were released from the hospital? "Fair, I'm still awfully weak but I think I'm doing all right."   Do you understand why you were in the hospital? yes   Do you understand the discharge instructions? yes   Where were you discharged to? Home   Items Reviewed:  Medications reviewed: yes  Allergies reviewed: yes  Dietary changes reviewed: no  Referrals reviewed: no, none made   Functional Questionnaire:   Activities of Daily Living (ADLs):   She states they are independent in the following: ambulation, bathing and hygiene, feeding, continence, grooming, toileting and dressing States they require assistance with the following: None   Any transportation issues/concerns?: yes; pt's daughter brings her to appts. Daughter gets off work at [employer], so pt's appts need to be in the afternoon after 2pm   Any patient concerns? no   Confirmed importance and date/time of follow-up visits scheduled yes  Provider Appointment booked with: deferred to schedulers as slots will need to unblocked  Confirmed with patient if condition begins to worsen call PCP or go to the ER.  Patient was given the office number and encouraged to call back with question or concerns.  : yes

## 2015-06-17 DIAGNOSIS — I959 Hypotension, unspecified: Secondary | ICD-10-CM | POA: Insufficient documentation

## 2015-06-20 ENCOUNTER — Ambulatory Visit (INDEPENDENT_AMBULATORY_CARE_PROVIDER_SITE_OTHER): Payer: Medicare Other | Admitting: Internal Medicine

## 2015-06-20 ENCOUNTER — Encounter: Payer: Self-pay | Admitting: Internal Medicine

## 2015-06-20 VITALS — BP 126/68 | HR 87 | Temp 98.1°F | Ht 65.0 in | Wt 128.0 lb

## 2015-06-20 DIAGNOSIS — R93429 Abnormal radiologic findings on diagnostic imaging of unspecified kidney: Secondary | ICD-10-CM | POA: Diagnosis not present

## 2015-06-20 DIAGNOSIS — Z09 Encounter for follow-up examination after completed treatment for conditions other than malignant neoplasm: Secondary | ICD-10-CM

## 2015-06-20 DIAGNOSIS — I714 Abdominal aortic aneurysm, without rupture, unspecified: Secondary | ICD-10-CM

## 2015-06-20 DIAGNOSIS — N390 Urinary tract infection, site not specified: Secondary | ICD-10-CM | POA: Diagnosis not present

## 2015-06-20 DIAGNOSIS — A419 Sepsis, unspecified organism: Secondary | ICD-10-CM | POA: Diagnosis not present

## 2015-06-20 LAB — CBC WITH DIFFERENTIAL/PLATELET
BASOS ABS: 0 10*3/uL (ref 0.0–0.1)
Basophils Relative: 0 % (ref 0–1)
Eosinophils Absolute: 0.1 10*3/uL (ref 0.0–0.7)
Eosinophils Relative: 1 % (ref 0–5)
HCT: 36.9 % (ref 36.0–46.0)
HEMOGLOBIN: 12.5 g/dL (ref 12.0–15.0)
LYMPHS ABS: 2.9 10*3/uL (ref 0.7–4.0)
LYMPHS PCT: 39 % (ref 12–46)
MCH: 30.8 pg (ref 26.0–34.0)
MCHC: 33.9 g/dL (ref 30.0–36.0)
MCV: 90.9 fL (ref 78.0–100.0)
MONOS PCT: 10 % (ref 3–12)
MPV: 10.2 fL (ref 8.6–12.4)
Monocytes Absolute: 0.7 10*3/uL (ref 0.1–1.0)
NEUTROS ABS: 3.7 10*3/uL (ref 1.7–7.7)
NEUTROS PCT: 50 % (ref 43–77)
Platelets: 392 10*3/uL (ref 150–400)
RBC: 4.06 MIL/uL (ref 3.87–5.11)
RDW: 14.6 % (ref 11.5–15.5)
WBC: 7.4 10*3/uL (ref 4.0–10.5)

## 2015-06-20 LAB — BASIC METABOLIC PANEL
BUN: 17 mg/dL (ref 7–25)
CHLORIDE: 105 mmol/L (ref 98–110)
CO2: 26 mmol/L (ref 20–31)
Calcium: 8.9 mg/dL (ref 8.6–10.4)
Creat: 0.79 mg/dL (ref 0.60–0.93)
Glucose, Bld: 118 mg/dL — ABNORMAL HIGH (ref 65–99)
POTASSIUM: 3.8 mmol/L (ref 3.5–5.3)
SODIUM: 141 mmol/L (ref 135–146)

## 2015-06-20 NOTE — Progress Notes (Signed)
Pre visit review using our clinic review tool, if applicable. No additional management support is needed unless otherwise documented below in the visit note. 

## 2015-06-20 NOTE — Patient Instructions (Signed)
GO TO THE LAB : Get the blood work    GO TO THE FRONT DESK Schedule a routine office visit or check up to be done in  3 months  No fasting   Front desk:   30

## 2015-06-20 NOTE — Progress Notes (Signed)
Subjective:    Patient ID: Sara [name], female    DOB: [birth year], 75 y.o.   MRN: [id number]  DOS:  06/20/2015 Type of visit - description : TCM   Interval history:  Shortly after she was seen at this office with a UTI he was admitted to the hospital 06/04/2015 for 3 days, DX was sepsis. DX with pyelonephritis, receive IV Levaquin; also  acute kidney injury and low BP resolved after IV fluids. Her LFTs were noted to be elevated CT of the abdomen was abnormal, see below  Results reviewed: Last potassium 3.1, last creatinine 0.3. WBC is decreased from 25K to 11.5 K.  IMPRESSION: 1. Mild right-sided hydronephrosis and minimal left-sided hydronephrosis, which may reflect chronic distention of the bladder. Mildly increased distal right ureteral wall enhancement may reflect right-sided ureteritis. 2. Mildly complex 4.3 cm isodense cyst at the lower pole of the left kidney, with minimal layering calcification. Given its appearance, this most likely benign. Additional scattered bilateral renal cysts noted. Mild left renal atrophy and scarring seen. 3. Nonobstructing right renal stones measure up to 5 mm in size. 4. 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 Aneurysmal dilatation arises just distal to the renal arteries, and resolves just above the aortic bifurcation. Diffuse calcification along the abdominal aorta and its branches, with associated mural thrombus but no significant luminal narrowing. Recommend followup by ultrasound in 2 years. This recommendation follows ACR consensus guidelines: White Paper of the ACR Incidental Findings Committee II on Vascular Findings. J Am Coll Radiol 2013; 10:789-794. 5. Tiny hiatal hernia noted. 6. Diffuse coronary artery calcifications seen. 7. 1.3 cm hypodensity within the right adrenal gland, nonspecific in appearance. This could be further assessed on adrenal protocol MRI or CT, on an elective  nonemergent basis.    Review of Systems Since she left the hospital she is doing better. Still weak but recovering. No fever or chills. Good by mouth tolerance. No nausea, vomiting, diarrhea. No abdominal pain. No dysuria or gross hematuria.   Past Medical History  Diagnosis Date  . Mitral valve regurgitation     trace  . Hypercholesteremia   . Arthritis   . H/O hiatal hernia   . Stroke (HCC)   . Coronary artery disease     sees Dr. Patty Sermons  . Hypertension     all managed by Dr. Haynes Dage bill  . Anxiety   . Bronchitis     hx of  . GERD (gastroesophageal reflux disease)     hx of  . Carotid artery occlusion     Past Surgical History  Procedure Laterality Date  . Cardiac catheterization  1994  . Hysterotomy    . Carpal tunnel release      left  . Hand surgery      bilateral  . Cholecystectomy    . Tee without cardioversion  04/12/2012    Procedure: TRANSESOPHAGEAL ECHOCARDIOGRAM (TEE);  Surgeon: Lewayne Bunting, MD;  Location: [facility] ENDOSCOPY;  Service: Cardiovascular;  Laterality: N/A;  . Back surgery      x 3 Dr Juliene Pina, last @ [facility] ~ 2005, rods, fusion, four surgery  . Endarterectomy  04/24/2012    Procedure: ENDARTERECTOMY CAROTID;  Surgeon: Larina Earthly, MD;  Location: [facility] OR;  Service: Vascular;  Laterality: Left;  . Carotid endarterectomy    . Spinal cord stimulator insertion    . Hip pinning,cannulated Left 02/08/2015    Procedure: CANNULATED HIP PINNING;  Surgeon: Vanita Panda  Magnus Ivan, MD;  Location: [facility] ORS;  Service: Orthopedics;  Laterality: Left;  . Hip fracture surgery Left     Social History   Social History  . Marital Status: Married    Spouse Name: N/A  . Number of Children: 4  . Years of Education: N/A   Occupational History  . retired     Social History Main Topics  . Smoking status: Former Smoker -- 0.50 packs/day for 60 years    Types: Cigarettes  . Smokeless tobacco: Never Used     Comment: quit 04-2015  . Alcohol Use: No  .  Drug Use: No  . Sexual Activity: Not on file   Other Topics Concern  . Not on file   Social History Narrative   Lives w/ husband   4 daughters , 2 in [place]        Medication List       This list is accurate as of: 06/20/15 11:59 PM.  Always use your most recent med list.               aspirin 325 MG EC tablet  Take 1 tablet (325 mg total) by mouth daily with breakfast.     atorvastatin 40 MG tablet  Commonly known as:  LIPITOR  Take 0.5 tablets (20 mg total) by mouth daily.     diazepam 5 MG tablet  Commonly known as:  VALIUM  Take 2.5 mg by mouth daily as needed for anxiety (anxiety).     feeding supplement Liqd  Take 1 Container by mouth daily.     gabapentin 300 MG capsule  Commonly known as:  NEURONTIN  Take 3 capsules (900 mg total) by mouth at bedtime. Or as directed Additional refills from PCP     HYDROcodone-acetaminophen 10-325 MG tablet  Commonly known as:  NORCO  Take 1 tablet by mouth every 6 (six) hours as needed for moderate pain (for pain).     metoprolol 50 MG tablet  Commonly known as:  LOPRESSOR  Take 0.5 tablets (25 mg total) by mouth 2 (two) times daily.     omeprazole 20 MG tablet  Commonly known as:  PRILOSEC OTC  Take 1 tablet (20 mg total) by mouth daily.           Objective:   Physical Exam BP 126/68 mmHg  Pulse 87  Temp(Src) 98.1 F (36.7 C) (Oral)  Ht 5\' 5"  (1.651 m)  Wt 128 lb (58.06 kg)  BMI 21.30 kg/m2  SpO2 97% General:   Well developed, well nourished . NAD.  HEENT:  Normocephalic . Face symmetric, atraumatic Lungs:  CTA B Normal respiratory effort, no intercostal retractions, no accessory muscle use. Heart: RRR,  no murmur.  no pretibial edema bilaterally  Abdomen:  Not distended, soft, non-tender. No rebound or rigidity. No mass or bruit. Skin: Not pale. Not jaundice Neurologic:  alert & oriented X3.  Speech normal, gait appropriate for age and unassisted Psych--  Cognition and judgment appear intact.    Cooperative with normal attention span and concentration.  Behavior appropriate. No anxious or depressed appearing.    Assessment & Plan:   Assessment HTN Hyperlipidemia Anxiety-- rarely uses diazepam CV: --CAD, Dr. Patty Sermons --H/o Stroke 2013:non hemorrhagic infarcts scattered throughout the [place] . 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 Check a UA and urine culture, recommend to call me if she does not continue to improve gradually.  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 Will refer to cardiology for this new finding. She is aware of the finding and knows to seek immediate medical attention if she has abdominal or back pain. Adrenal gland hypodensity: Will get elective CT with adrenal protocol. Unable to get an MRI due to cord stimulator. RTC  3months

## 2015-06-21 LAB — URINALYSIS, ROUTINE W REFLEX MICROSCOPIC
BILIRUBIN URINE: NEGATIVE
GLUCOSE, UA: NEGATIVE
Ketones, ur: NEGATIVE
LEUKOCYTES UA: NEGATIVE
Nitrite: NEGATIVE
SPECIFIC GRAVITY, URINE: 1.019 (ref 1.001–1.035)
pH: 6 (ref 5.0–8.0)

## 2015-06-21 LAB — URINALYSIS, MICROSCOPIC ONLY
Bacteria, UA: NONE SEEN [HPF]
CASTS: NONE SEEN [LPF]
Crystals: NONE SEEN [HPF]
RBC / HPF: >60 RBC/HPF — AB (ref <=2)
YEAST: NONE SEEN [HPF]

## 2015-06-22 DIAGNOSIS — Z09 Encounter for follow-up examination after completed treatment for conditions other than malignant neoplasm: Secondary | ICD-10-CM | POA: Insufficient documentation

## 2015-06-22 DIAGNOSIS — I714 Abdominal aortic aneurysm, without rupture, unspecified: Secondary | ICD-10-CM | POA: Insufficient documentation

## 2015-06-22 NOTE — Assessment & Plan Note (Signed)
Urosepsis: Status post admission to hospital with sepsis due to pyelonephritis, recovering well. Check a UA and urine culture, recommend to call me if she does not continue to improve gradually.  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 Will refer to cardiology for this new finding. She is aware of the finding and knows to seek immediate medical attention if she has abdominal or back pain. Adrenal gland hypodensity: Will get elective CT with adrenal protocol. Unable to get an MRI due to cord stimulator. RTC  3months

## 2015-06-23 ENCOUNTER — Other Ambulatory Visit: Payer: Self-pay

## 2015-06-23 DIAGNOSIS — R9389 Abnormal findings on diagnostic imaging of other specified body structures: Secondary | ICD-10-CM

## 2015-06-24 ENCOUNTER — Ambulatory Visit (HOSPITAL_BASED_OUTPATIENT_CLINIC_OR_DEPARTMENT_OTHER)
Admission: RE | Admit: 2015-06-24 | Discharge: 2015-06-24 | Disposition: A | Discharge status: Home / Self Care | Payer: Medicare Other | Source: Ambulatory Visit | Attending: Internal Medicine | Admitting: Internal Medicine

## 2015-06-24 DIAGNOSIS — N200 Calculus of kidney: Secondary | ICD-10-CM | POA: Diagnosis not present

## 2015-06-24 DIAGNOSIS — R9389 Abnormal findings on diagnostic imaging of other specified body structures: Secondary | ICD-10-CM

## 2015-06-24 DIAGNOSIS — I714 Abdominal aortic aneurysm, without rupture: Secondary | ICD-10-CM | POA: Insufficient documentation

## 2015-06-24 DIAGNOSIS — R938 Abnormal findings on diagnostic imaging of other specified body structures: Secondary | ICD-10-CM | POA: Diagnosis present

## 2015-06-25 LAB — URINE CULTURE: Colony Count: 30000

## 2015-06-28 ENCOUNTER — Other Ambulatory Visit: Payer: Self-pay | Admitting: Cardiology

## 2015-06-30 ENCOUNTER — Ambulatory Visit (INDEPENDENT_AMBULATORY_CARE_PROVIDER_SITE_OTHER): Payer: Medicare Other | Admitting: Physician Assistant

## 2015-06-30 ENCOUNTER — Encounter: Payer: Self-pay | Admitting: Physician Assistant

## 2015-06-30 VITALS — BP 108/62 | HR 64 | Ht 65.0 in | Wt 126.0 lb

## 2015-06-30 DIAGNOSIS — I714 Abdominal aortic aneurysm, without rupture, unspecified: Secondary | ICD-10-CM

## 2015-06-30 DIAGNOSIS — I2510 Atherosclerotic heart disease of native coronary artery without angina pectoris: Secondary | ICD-10-CM

## 2015-06-30 DIAGNOSIS — Z720 Tobacco use: Secondary | ICD-10-CM

## 2015-06-30 DIAGNOSIS — I10 Essential (primary) hypertension: Secondary | ICD-10-CM | POA: Diagnosis not present

## 2015-06-30 NOTE — Assessment & Plan Note (Signed)
Stable without angina 

## 2015-06-30 NOTE — Progress Notes (Signed)
Cardiology Office Note   Date:  06/30/2015   ID:  Sara BrickRebecca S Lievanos, DOB 08/19/1940, MRN [id number]  PCP:  Willow OraJose Paz, MD  Cardiologist: Previously Dr. Patty SermonsBrackbill, now Dr. Anne FuSkains  Chief Complaint: Back pain    History of Present Illness: Sara [name] is a 75 y.o. female who presents for referral from primary care after incidental finding of abdominal aortic aneurysm.  She has a history of essential hypertension and a history of hypercholesterolemia. Cardiac catheterization in 1994 showed mild three-vessel coronary atherosclerosis without obstructing lesions. She had a normal adenosine Cardiolite stress test in 2009. On 04/12/2012 the patient developed some tingling and clumsiness of her right hand. [facility] was [facility]. She underwent a TEE on 04/12/12 which showed no intracardiac clot or mass but she did have severe aortic plaque. She was discharged and then returned on 04/24/12 for a left carotid endarterectomy and background patch angioplasty by Dr. Arbie CookeyEarly. She's also had a nerve stimulator device implanted for chronic back pain. She also fractured her hip in 01/2015 when she lost her balance.  Patient had a recent hospitalization with sepsis secondary to pyelonephritis. On CT she was found to have infrarenal abdominal aortic aneurysm of 3.5 cm on CT 06/04/15. Repeat CT scan on 06/24/15 3.4 cm abdominal aortic aneurysm noted stable. Recommend follow-up ultrasound in 2 years. They also know diffuse coronary artery calcification.  Patient comes in today feeling quite well. She is still weak from her recent sepsis but overall has no cardiac complaints. She denies any chest pain, palpitations, dyspnea, dyspnea on exertion, dizziness or presyncope. She quit smoking 2 months ago. She keeps a close eye on her blood pressure and is well-controlled.    Past Medical History  Diagnosis Date  . Mitral valve regurgitation     trace  . Hypercholesteremia   . Arthritis   . H/O hiatal hernia    . Stroke (HCC)   . Coronary artery disease     sees Dr. Patty SermonsBrackbill  . Hypertension     all managed by Dr. Haynes DageBrack bill  . Anxiety   . Bronchitis     hx of  . GERD (gastroesophageal reflux disease)     hx of  . Carotid artery occlusion     Past Surgical History  Procedure Laterality Date  . Cardiac catheterization  1994  . Hysterotomy    . Carpal tunnel release      left  . Hand surgery      bilateral  . Cholecystectomy    . Tee without cardioversion  04/12/2012    Procedure: TRANSESOPHAGEAL ECHOCARDIOGRAM (TEE);  Surgeon: Lewayne BuntingBrian S Crenshaw, MD;  [facility];  Service: Cardiovascular;  Laterality: N/A;  . Back surgery      x 3 Dr Juliene PinaGeoffrey, [facility] ~ 2005, rods, fusion, four surgery  . Endarterectomy  04/24/2012    Procedure: ENDARTERECTOMY CAROTID;  Surgeon: Larina Earthlyodd F Early, MD;  [facility];  Service: Vascular;  Laterality: Left;  . Carotid endarterectomy    . Spinal cord stimulator insertion    . Hip pinning,cannulated Left 02/08/2015    Procedure: CANNULATED HIP PINNING;  Surgeon: Kathryne Hitchhristopher Y Blackman, MD;  Location: [facility] ORS;  Service: Orthopedics;  Laterality: Left;  . Hip fracture surgery Left      Current Outpatient Prescriptions  Medication Sig Dispense Refill  . amLODipine (NORVASC) 10 MG tablet Take 5 mg by mouth daily.    Marland Kitchen. aspirin EC 325 MG EC tablet Take 1  tablet (325 mg total) by mouth daily with breakfast. 30 tablet 0  . atorvastatin (LIPITOR) 40 MG tablet Take 0.5 tablets (20 mg total) by mouth daily. 15 tablet 3  . diazepam (VALIUM) 5 MG tablet Take 2.5 mg by mouth daily as needed for anxiety (anxiety).    . gabapentin (NEURONTIN) 300 MG capsule Take 3 capsules (900 mg total) by mouth at bedtime. Or as directed Additional refills from PCP 270 capsule 0  . HYDROcodone-acetaminophen (NORCO) 10-325 MG tablet Take 1 tablet by mouth every 6 (six) hours as needed for moderate pain (for pain). 60 tablet 0  . metoprolol (LOPRESSOR) 50 MG tablet  Take 0.5 tablets (25 mg total) by mouth 2 (two) times daily. 30 tablet 3  . omeprazole (PRILOSEC OTC) 20 MG tablet Take 1 tablet (20 mg total) by mouth daily. 30 tablet 3   No current facility-administered medications for this visit.    Allergies:   Percocet; Hydrochlorothiazide; Indomethacin; Paroxetine hcl; Penicillins; Pravachol; Quinine derivatives; Wellbutrin; and Zocor    Social History:  The patient  reports that she has quit smoking. Her smoking use included Cigarettes. She has a 30 pack-year smoking history. She has never used smokeless tobacco. She reports that she does not drink alcohol or use illicit drugs.   Family History:  The patient's   family history includes Cancer in her sister; Heart attack in her father; Heart disease in her father, sister, and sister.    ROS:  Please see the history of present illness.   Otherwise, review of systems are positive for back pain still with dysuria.   All other systems are reviewed and negative.    PHYSICAL EXAM: VS:  BP 108/62 mmHg  Pulse 64  Ht  (1.651 m)  Wt 126 lb (57.153 kg)  BMI 20.97 kg/m2 , BMI Body mass index is 20.97 kg/(m^2). GEN: Well nourished, well developed, in no acute distress Neck: no JVD, HJR, carotid bruits, or masses Cardiac: RRR; 2/6 systolic murmur at the left sternal border, no gallop, rubs, thrill or heave,  Respiratory:  clear to auscultation bilaterally, normal work of breathing GI: soft, nontender, nondistended, + BS MS: no deformity or atrophy Extremities: without cyanosis, clubbing, edema, good distal pulses bilaterally.  Skin: warm and dry, no rash Neuro:  Strength and sensation are intact    EKG:  EKG is not ordered today. EKG reviewed from 06/04/15 showed normal sinus rhythm   Recent Labs: 03/19/2015: TSH 1.684 06/06/2015: ALT 123* 06/20/2015: BUN 17; Creat 0.79; Hemoglobin 12.5; Platelets 392; Potassium 3.8; Sodium 141    Lipid Panel    Component Value Date/Time   CHOL 119*  03/19/2015 0954   TRIG 165* 03/19/2015 0954   HDL 39* 03/19/2015 0954   CHOLHDL 3.1 03/19/2015 0954   VLDL 33* 03/19/2015 0954   LDLCALC 47 03/19/2015 0954   LDLDIRECT 70.0 05/23/2014 1129      Wt Readings from Last 3 Encounters:  06/30/15 126 lb (57.153 kg)  06/20/15 128 lb (58.06 kg)  06/04/15 129 lb 3.2 oz (58.605 kg)      Other studies Reviewed: Additional studies/ records that were reviewed today include and review of the records demonstrates:  IMPRESSION: Low-density nodule in the right adrenal gland is most compatible with adenoma.   No change in the bilateral renal cystic lesions. Right lower pole nephrolithiasis.   Stable 3.4 cm abdominal aortic aneurysm.     Electronically Signed   By: Charlett Nose M.D.   On: 06/24/2015 16:19  CT 06/04/15 IMPRESSION: 1. Mild right-sided hydronephrosis and minimal left-sided hydronephrosis, which may reflect chronic distention of the bladder. Mildly increased distal right ureteral wall enhancement may reflect right-sided ureteritis. 2. Mildly complex 4.3 cm isodense cyst at the lower pole of the left kidney, with minimal layering calcification. Given its appearance, this most likely benign. Additional scattered bilateral renal cysts noted. Mild left renal atrophy and scarring seen. 3. Nonobstructing right renal stones measure up to 5 mm in size. 4. 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 Aneurysmal dilatation arises just distal to the renal arteries, and resolves just above the aortic bifurcation. Diffuse calcification along the abdominal aorta and its branches, with associated mural thrombus but no significant luminal narrowing. Recommend followup by ultrasound in 2 years. This recommendation follows ACR consensus guidelines: White Paper of the ACR Incidental Findings Committee II on Vascular Findings. J Am Coll Radiol 2013; 10:789-794. 5. Tiny hiatal hernia noted. 6. Diffuse  coronary artery calcifications seen. 7. 1.3 cm hypodensity within the right adrenal gland, nonspecific in appearance. This could be further assessed on adrenal protocol MRI or CT, on an elective nonemergent basis.      ASSESSMENT AND PLAN: AAA (abdominal aortic aneurysm) without rupture (HCC) Patient was found to have an infrarenal abdominal aortic aneurysm 3.5 cm on CT scan 06/04/15 follow-up CT measured it at 3.4 cm. They recommend two-year follow-up. I discussed this patient with Dr. Anne Fu who says we could refer to the [facility]. I discussed this with the patient and she prefer to hold off at this time. She is seen a lot of doctors recently and does not want to see a Careers adviser. She watches her blood pressure closely and knows to go to the emergency room if she has any significant abdominal pain. Follow-up with Dr. Anne Fu in 6 months. Will need repeat CT scan in one year.  Hypertension Blood pressure well controlled  Coronary artery disease Stable without angina  Tobacco abuse Patient quit smoking 2 months ago.     Elson Clan, PA-C  06/30/2015 12:03 PM    [facility] [address] [place], [place], [place]  [address] Phone: [phone]; Fax: [phone]

## 2015-06-30 NOTE — Assessment & Plan Note (Signed)
Blood pressure well controlled

## 2015-06-30 NOTE — Assessment & Plan Note (Signed)
Patient quit smoking 2 months ago 

## 2015-06-30 NOTE — Patient Instructions (Signed)
Medication Instructions:   Your physician recommends that you continue on your current medications as directed. Please refer to the Current Medication list given to you today.    If you need a refill on your cardiac medications before your next appointment, please call your pharmacy.  Labwork: NONE ORDER TODAY    Testing/Procedures: NONE ORDER TODAY    Follow-Up: Your physician wants you to follow-up in:  IN  6  MONTHS WITH DR  SKAINS You will receive a reminder letter in the mail two months in advance. If you don't receive a letter, please call our office to schedule the follow-up appointment.      Any Other Special Instructions Will Be Listed Below (If Applicable).                                                                                                                                                   

## 2015-06-30 NOTE — Assessment & Plan Note (Signed)
Patient was found to have an infrarenal abdominal aortic aneurysm 3.5 cm on CT scan 06/04/15 follow-up CT measured it at 3.4 cm. They recommend two-year follow-up. I discussed this patient with Dr. Anne FuSkains who says we could refer to the [facility]. I discussed this with the patient and she prefer to hold off at this time. She is seen a lot of doctors recently and does not want to see a Careers advisersurgeon. She watches her blood pressure closely and knows to go to the emergency room if she has any significant abdominal pain. Follow-up with Dr. Anne FuSkains in 6 months. Will need repeat CT scan in one year.

## 2015-07-30 ENCOUNTER — Other Ambulatory Visit: Payer: Self-pay | Admitting: Cardiology

## 2015-07-30 ENCOUNTER — Other Ambulatory Visit: Payer: Self-pay | Admitting: Internal Medicine

## 2015-07-30 NOTE — Telephone Encounter (Signed)
Okay # 30 and 2 RFs

## 2015-07-30 NOTE — Telephone Encounter (Signed)
Pt is requesting refill on Diazepam.  Last OV: 06/20/2015 Last Fill: 01/21/2015 #30 and 5RF by Dr. Patty SermonsBrackbill UDS: None at our office  Please advise.

## 2015-07-31 NOTE — Telephone Encounter (Signed)
Rx printed, awaiting MD signature.  

## 2015-07-31 NOTE — Telephone Encounter (Signed)
Rx faxed to [facility] pharmacy.  

## 2015-08-06 ENCOUNTER — Other Ambulatory Visit: Payer: Self-pay | Admitting: Internal Medicine

## 2015-09-01 ENCOUNTER — Emergency Department (HOSPITAL_COMMUNITY): Payer: Medicare Other

## 2015-09-01 ENCOUNTER — Telehealth: Payer: Self-pay | Admitting: Internal Medicine

## 2015-09-01 ENCOUNTER — Emergency Department (HOSPITAL_COMMUNITY)
Admission: EM | Admit: 2015-09-01 | Discharge: 2015-09-01 | Disposition: A | Discharge status: Home / Self Care | Payer: Medicare Other | Attending: Emergency Medicine | Admitting: Emergency Medicine

## 2015-09-01 ENCOUNTER — Encounter (HOSPITAL_COMMUNITY): Payer: Self-pay

## 2015-09-01 DIAGNOSIS — F419 Anxiety disorder, unspecified: Secondary | ICD-10-CM | POA: Insufficient documentation

## 2015-09-01 DIAGNOSIS — Z9889 Other specified postprocedural states: Secondary | ICD-10-CM | POA: Insufficient documentation

## 2015-09-01 DIAGNOSIS — M1990 Unspecified osteoarthritis, unspecified site: Secondary | ICD-10-CM | POA: Diagnosis not present

## 2015-09-01 DIAGNOSIS — E7800 Pure hypercholesterolemia, unspecified: Secondary | ICD-10-CM | POA: Diagnosis not present

## 2015-09-01 DIAGNOSIS — IMO0002 Reserved for concepts with insufficient information to code with codable children: Secondary | ICD-10-CM

## 2015-09-01 DIAGNOSIS — Z7982 Long term (current) use of aspirin: Secondary | ICD-10-CM | POA: Diagnosis not present

## 2015-09-01 DIAGNOSIS — Z880 Allergy status to penicillin: Secondary | ICD-10-CM | POA: Insufficient documentation

## 2015-09-01 DIAGNOSIS — M8448XA Pathological fracture, other site, initial encounter for fracture: Secondary | ICD-10-CM | POA: Diagnosis not present

## 2015-09-01 DIAGNOSIS — Z87891 Personal history of nicotine dependence: Secondary | ICD-10-CM | POA: Insufficient documentation

## 2015-09-01 DIAGNOSIS — M546 Pain in thoracic spine: Secondary | ICD-10-CM

## 2015-09-01 DIAGNOSIS — Z79899 Other long term (current) drug therapy: Secondary | ICD-10-CM | POA: Diagnosis not present

## 2015-09-01 DIAGNOSIS — K219 Gastro-esophageal reflux disease without esophagitis: Secondary | ICD-10-CM | POA: Insufficient documentation

## 2015-09-01 DIAGNOSIS — R0789 Other chest pain: Secondary | ICD-10-CM

## 2015-09-01 DIAGNOSIS — I2510 Atherosclerotic heart disease of native coronary artery without angina pectoris: Secondary | ICD-10-CM | POA: Diagnosis not present

## 2015-09-01 DIAGNOSIS — Z8673 Personal history of transient ischemic attack (TIA), and cerebral infarction without residual deficits: Secondary | ICD-10-CM | POA: Insufficient documentation

## 2015-09-01 DIAGNOSIS — Z8709 Personal history of other diseases of the respiratory system: Secondary | ICD-10-CM | POA: Diagnosis not present

## 2015-09-01 DIAGNOSIS — R079 Chest pain, unspecified: Secondary | ICD-10-CM | POA: Diagnosis present

## 2015-09-01 DIAGNOSIS — I10 Essential (primary) hypertension: Secondary | ICD-10-CM | POA: Diagnosis not present

## 2015-09-01 LAB — BASIC METABOLIC PANEL
Anion gap: 9 (ref 5–15)
BUN: 21 mg/dL — ABNORMAL HIGH (ref 6–20)
CO2: 28 mmol/L (ref 22–32)
Calcium: 8.8 mg/dL — ABNORMAL LOW (ref 8.9–10.3)
Chloride: 105 mmol/L (ref 101–111)
Creatinine, Ser: 0.83 mg/dL (ref 0.44–1.00)
GFR calc Af Amer: >60 mL/min (ref >60)
GFR calc non Af Amer: >60 mL/min (ref >60)
Glucose, Bld: 99 mg/dL (ref 65–99)
Potassium: 4.3 mmol/L (ref 3.5–5.1)
Sodium: 142 mmol/L (ref 135–145)

## 2015-09-01 LAB — CBC WITH DIFFERENTIAL/PLATELET
Basophils Absolute: 0 10*3/uL (ref 0.0–0.1)
Basophils Relative: 0 %
Eosinophils Absolute: 0.2 10*3/uL (ref 0.0–0.7)
Eosinophils Relative: 3 %
HCT: 36.3 % (ref 36.0–46.0)
Hemoglobin: 11.4 g/dL — ABNORMAL LOW (ref 12.0–15.0)
Lymphocytes Relative: 27 %
Lymphs Abs: 2.1 10*3/uL (ref 0.7–4.0)
MCH: 30 pg (ref 26.0–34.0)
MCHC: 31.4 g/dL (ref 30.0–36.0)
MCV: 95.5 fL (ref 78.0–100.0)
Monocytes Absolute: 0.8 10*3/uL (ref 0.1–1.0)
Monocytes Relative: 10 %
Neutro Abs: 4.5 10*3/uL (ref 1.7–7.7)
Neutrophils Relative %: 60 %
Platelets: 258 10*3/uL (ref 150–400)
RBC: 3.8 MIL/uL — ABNORMAL LOW (ref 3.87–5.11)
RDW: 13.7 % (ref 11.5–15.5)
WBC: 7.6 10*3/uL (ref 4.0–10.5)

## 2015-09-01 LAB — I-STAT TROPONIN, ED
TROPONIN I, POC: 0.01 ng/mL (ref 0.00–0.08)
Troponin i, poc: 0 ng/mL (ref 0.00–0.08)

## 2015-09-01 MED ORDER — MORPHINE SULFATE (PF) 4 MG/ML IV SOLN
4.0000 mg | Freq: Once | INTRAVENOUS | Status: AC
  Administered 2015-09-01: 4 mg via INTRAVENOUS
  Filled 2015-09-01: qty 1

## 2015-09-01 MED ORDER — IOPAMIDOL (ISOVUE-370) INJECTION 76%
INTRAVENOUS | Status: AC
  Administered 2015-09-01: 100 mL via INTRAVENOUS
  Filled 2015-09-01: qty 100

## 2015-09-01 NOTE — Telephone Encounter (Signed)
Caller name: Sara [name] Relationship to patient: self Can be reached: [phone]  Reason for call: Pt called in stating upper back pain that comes thru to her chest under breasts. [facility]

## 2015-09-01 NOTE — ED Notes (Signed)
Pt stats she understands instructions.

## 2015-09-01 NOTE — ED Notes (Signed)
Pt request oob to br. Pt walks with steady gait.

## 2015-09-01 NOTE — Progress Notes (Signed)
Spoke with Sara [name]. He wants CTA for dissection not pulmonary embolus.

## 2015-09-01 NOTE — Telephone Encounter (Signed)
Patient Name: Sara [name] DOB: 03/18/1941 Initial Comment Caller states, upper back pain, hurts in chest and under her breast as well Nurse Assessment Nurse: Yetta BarreJones, RN, Miranda Date/Time (Eastern Time): 09/01/2015 10:42:56 AM Confirm and document reason for call. If symptomatic, describe symptoms. You must click the next button to save text entered. ---Caller states she has been having pain in her chest and in her upper back since Friday. Has the patient traveled out of the country within the last 30 days? ---Not Applicable Does the patient have any new or worsening symptoms? ---Yes Will a triage be completed? ---Yes Related visit to physician within the last 2 weeks? ---No Does the PT have any chronic conditions? (i.e. diabetes, asthma, etc.) ---Yes List chronic conditions. ---Chronic back pain, HTN, High Cholesterol, Angina Is this a behavioral health or substance abuse call? ---No Guidelines Guideline Title Affirmed Question Affirmed Notes Chest Pain [1] Chest pain lasts > 5 minutes AND [2] history of heart disease (i.e., heart attack, bypass surgery, angina, angioplasty, CHF; not just a heart murmur) Final Disposition User Call EMS 911 Now Yetta BarreJones, RN, Miranda Referrals GO TO FACILITY OTHER - SPECIFY - [facility] Disagree/Comply: Comply

## 2015-09-01 NOTE — Discharge Instructions (Signed)
Please follow-up with your primary care provider for reevaluation. Please return to emergency room immediately if he expands any new or worsening signs or symptoms Back Pain, Adult Back pain is very common in adults.The cause of back pain is rarely dangerous and the pain often gets better over time.The cause of your back pain may not be known. Some common causes of back pain include:  Strain of the muscles or ligaments supporting the spine.  Wear and tear (degeneration) of the spinal disks.  Arthritis.  Direct injury to the back. For many people, back pain may return. Since back pain is rarely dangerous, most people can learn to manage this condition on their own. HOME CARE INSTRUCTIONS Watch your back pain for any changes. The following actions may help to lessen any discomfort you are feeling:  Remain active. It is stressful on your back to sit or stand in one place for long periods of time. Do not sit, drive, or stand in one place for more than 30 minutes at a time. Take short walks on even surfaces as soon as you are able.Try to increase the length of time you walk each day.  Exercise regularly as directed by your health care provider. Exercise helps your back heal faster. It also helps avoid future injury by keeping your muscles strong and flexible.  Do not stay in bed.Resting more than 1-2 days can delay your recovery.  Pay attention to your body when you bend and lift. The most comfortable positions are those that put less stress on your recovering back. Always use proper lifting techniques, including:  Bending your knees.  Keeping the load close to your body.  Avoiding twisting.  Find a comfortable position to sleep. Use a firm mattress and lie on your side with your knees slightly bent. If you lie on your back, put a pillow under your knees.  Avoid feeling anxious or stressed.Stress increases muscle tension and can worsen back pain.It is important to recognize when you  are anxious or stressed and learn ways to manage it, such as with exercise.  Take medicines only as directed by your health care provider. Over-the-counter medicines to reduce pain and inflammation are often the most helpful.Your health care provider may prescribe muscle relaxant drugs.These medicines help dull your pain so you can more quickly return to your normal activities and healthy exercise.  Apply ice to the injured area:  Put ice in a plastic bag.  Place a towel between your skin and the bag.  Leave the ice on for 20 minutes, 2-3 times a day for the first 2-3 days. After that, ice and heat may be alternated to reduce pain and spasms.  Maintain a healthy weight. Excess weight puts extra stress on your back and makes it difficult to maintain good posture. SEEK MEDICAL CARE IF:  You have pain that is not relieved with rest or medicine.  You have increasing pain going down into the legs or buttocks.  You have pain that does not improve in one week.  You have night pain.  You lose weight.  You have a fever or chills. SEEK IMMEDIATE MEDICAL CARE IF:   You develop new bowel or bladder control problems.  You have unusual weakness or numbness in your arms or legs.  You develop nausea or vomiting.  You develop abdominal pain.  You feel faint.   This information is not intended to replace advice given to you by your health care provider. Make sure you discuss any  questions you have with your health care provider.   Document Released: 04/12/2005 Document Revised: 05/03/2014 Document Reviewed: 08/14/2013 Elsevier Interactive Patient Education [employer].

## 2015-09-01 NOTE — ED Provider Notes (Signed)
CSN: [id number]     Arrival date & time 09/01/15  1204 History   First MD Initiated Contact with Patient 09/01/15 1212     Chief Complaint  Patient presents with  . Chest Pain    HPI   Chest Pain  75 year old female presents today with complaints of chest pain. Patient reports that on Friday she started experiencing chest discomfort. She reports this is located underneath her breasts bilaterally, symptoms radiate around to her back. She reports pain is worsened with movement of the upper torso, described as sharp. She denies any associated shortness breath, cough, lotion swelling or edema, nausea vomiting or diarrhea, abdominal pain, indigestion. Patient denies any history of the same. Patient reports a 60 pack year history smoking, hyperlipidemia. 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  Patient most recently saw Dr. Patty Sermons of cardiology, but notes that she does not have a cardiologist since Dr. Patty Sermons.    Past Medical History  Diagnosis Date  . Mitral valve regurgitation     trace  . Hypercholesteremia   . Arthritis   . H/O hiatal hernia   . Stroke (HCC)   . Coronary artery disease     sees Dr. Patty Sermons  . Hypertension     all managed by Dr. Haynes Dage bill  . Anxiety   . Bronchitis     hx of  . GERD (gastroesophageal reflux disease)     hx of  . Carotid artery occlusion    Past Surgical History  Procedure Laterality Date  . Cardiac catheterization  1994  . Hysterotomy    . Carpal tunnel release      left  . Hand surgery      bilateral  . Cholecystectomy    . Tee without cardioversion  04/12/2012    Procedure: TRANSESOPHAGEAL ECHOCARDIOGRAM (TEE);  Surgeon: Lewayne Bunting, MD;  Location: [facility] ENDOSCOPY;  Service: Cardiovascular;  Laterality: N/A;  . Back surgery      x 3 Dr Juliene Pina, last @ [facility] ~ 2005, rods, fusion, four surgery  . Endarterectomy  04/24/2012    Procedure: ENDARTERECTOMY CAROTID;  Surgeon: Larina Earthly, MD;   Location: [facility] OR;  Service: Vascular;  Laterality: Left;  . Carotid endarterectomy    . Spinal cord stimulator insertion    . Hip pinning,cannulated Left 02/08/2015    Procedure: CANNULATED HIP PINNING;  Surgeon: Kathryne Hitch, MD;  Location: [facility] ORS;  Service: Orthopedics;  Laterality: Left;  . Hip fracture surgery Left    Family History  Problem Relation Age of Onset  . Heart attack Father   . Heart disease Father     before age 32  . Heart disease Sister   . Cancer Sister   . Heart disease Sister    Social History  Substance Use Topics  . Smoking status: Former Smoker -- 0.50 packs/day for 60 years    Types: Cigarettes  . Smokeless tobacco: Never Used     Comment: quit 04-2015  . Alcohol Use: No   OB History    No data available     Review of Systems  All other systems reviewed and are negative.   Allergies  Percocet; Hydrochlorothiazide; Indomethacin; Paroxetine hcl; Penicillins; Pravachol; Quinine derivatives; Wellbutrin; and Zocor  Home Medications   Prior to Admission medications   Medication Sig Start Date End Date Taking? Authorizing Provider  amLODipine (NORVASC) 10 MG tablet TAKE 1 TABLET BY MOUTH EVERY DAY Patient taking differently: TAKE 1/2 TABLET  BY MOUTH EVERY DAY 07/01/15  Yes Jake Bathe, MD  aspirin EC 81 MG tablet Take 81 mg by mouth daily.   Yes Historical Provider, MD  atorvastatin (LIPITOR) 40 MG tablet Take 0.5 tablets (20 mg total) by mouth daily. Patient taking differently: Take 20 mg by mouth daily with supper.  06/03/15  Yes Wanda Plump, MD  diazepam (VALIUM) 5 MG tablet Take 1 tablet (5 mg total) by mouth daily as needed. Patient taking differently: Take 2.5 mg by mouth at bedtime as needed (sleep).  07/31/15  Yes Wanda Plump, MD  gabapentin (NEURONTIN) 300 MG capsule Take 3 capsules (900 mg total) by mouth at bedtime. Or as directed Additional refills from PCP Patient taking differently: Take 300-600 mg by mouth 2 (two) times daily. Take 1  capsule (300 mg) by mouth every morning and 2 capsules (600 mg) with supper 06/03/15  Yes Wanda Plump, MD  HYDROcodone-acetaminophen [facility]) 10-325 MG tablet Take 1 tablet by mouth every 6 (six) hours as needed for moderate pain (for pain). 02/09/15  Yes Kathryne Hitch, MD  metoprolol (LOPRESSOR) 50 MG tablet Take 0.5 tablets (25 mg total) by mouth 2 (two) times daily. 06/03/15  Yes Wanda Plump, MD  omeprazole (PRILOSEC OTC) 20 MG tablet Take 1 tablet (20 mg total) by mouth daily. 06/03/15  Yes Wanda Plump, MD  vitamin B-12 (CYANOCOBALAMIN) 1000 MCG tablet Take 1,000 mcg by mouth daily.   Yes Historical Provider, MD  aspirin EC 325 MG EC tablet Take 1 tablet (325 mg total) by mouth daily with breakfast. Patient not taking: Reported on 09/01/2015 02/09/15   Kathryne Hitch, MD   BP 147/69 mmHg  Pulse 72  Temp(Src) 97.9 F (36.6 C) (Oral)  Resp 11  Ht 5\' 5"  (1.651 m)  Wt 56.7 kg  BMI 20.80 kg/m2  SpO2 97%   Physical Exam  Constitutional: She is oriented to person, place, and time. She appears well-developed and well-nourished.  HENT:  Head: Normocephalic and atraumatic.  Eyes: Conjunctivae are normal. Pupils are equal, round, and reactive to light. Right eye exhibits no discharge. Left eye exhibits no discharge. No scleral icterus.  Neck: Normal range of motion. No JVD present. No tracheal deviation present.  Cardiovascular: Normal rate, regular rhythm and normal heart sounds.   Pulmonary/Chest: Breath sounds normal. No stridor. No respiratory distress. She has no wheezes. She has no rales. She exhibits no tenderness.  Abdominal: Soft. She exhibits no distension and no mass. There is no tenderness.  Musculoskeletal: She exhibits no edema or tenderness.  Back is nontender to palpation, patient has stimulator leads and unit palpable with no pain  Neurological: She is alert and oriented to person, place, and time. Coordination normal.  Skin: Skin is warm and dry. No rash noted. No  erythema. No pallor.  Psychiatric: She has a normal mood and affect. Her behavior is normal. Judgment and thought content normal.  Nursing note and vitals reviewed.   ED Course  Procedures (including critical care time) Labs Review Labs Reviewed  CBC WITH DIFFERENTIAL/PLATELET - Abnormal; Notable for the following:    RBC 3.80 (*)    Hemoglobin 11.4 (*)    All other components within normal limits  BASIC METABOLIC PANEL - Abnormal; Notable for the following:    BUN 21 (*)    Calcium 8.8 (*)    All other components within normal limits  I-STAT TROPOININ, ED  Rosezena Sensor, ED    Imaging Review  Dg Chest 2 View  09/01/2015  CLINICAL DATA:  Chest pain EXAM: CHEST  2 VIEW COMPARISON:  06/04/2015 FINDINGS: Normal heart size. Aortic atherosclerosis identified. No pleural effusion or edema. No airspace consolidation identified. The bones appear diffusely osteopenic. There is a compression fracture within the mid thoracic spine with loss of approximately 15% of the superior endplate noted. This is a new finding when compared with 06/03/2015. IMPRESSION: 1. No active cardiopulmonary abnormalities. 2. Aortic atherosclerosis 3. Mid thoracic spine compression deformity is new since 06/03/2015. Electronically Signed   By: Signa Kellaylor  Stroud M.D.   On: 09/01/2015 13:37   Ct Angio Chest/abd/pel For Dissection W And/or W/wo  09/01/2015  CLINICAL DATA:  Chest pain EXAM: CT ANGIOGRAPHY CHEST, ABDOMEN AND PELVIS TECHNIQUE: Multidetector CT imaging through the chest, abdomen and pelvis was performed using the standard protocol during bolus administration of intravenous contrast. Multiplanar reconstructed images and MIPs were obtained and reviewed to evaluate the vascular anatomy. CONTRAST:  100 cc Isovue 370 COMPARISON:  06/24/2015.  06/04/2015. FINDINGS: CTA CHEST FINDINGS There is no evidence of aortic dissection or intramural hematoma. Maximal diameter of the ascending aorta is 3.1 cm. Maximal diameter of the  proximal descending aorta is 3.7 cm. There is extensive calcified aneurysm other chronic mural plaque throughout the aortic arch and descending aorta. There is calcified plaque at the origin of the innominate and right subclavian arteries. There is some plaque in the right common carotid artery. There is chronic smooth plaque at the origin of the right vertebral artery. No significant focal narrowing in these vessels. There is extensive calcified plaque at the origin of the left common carotid artery. Significant narrowing is difficult to quantify. 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 Maximal diameter at the level of the ulcer is 11 mm. There is calcified plaque at the origin of the left vertebral artery. There are no filling defects in the pulmonary arterial tree to suggest acute pulmonary thromboembolism. No pericardial effusion.  Small mediastinal nodes. Minimal 3 vessel coronary artery calcification. Lungs are clear. Calcified scarring at the right apex. Healing anterior right eighth rib fracture on image 89. Superior endplate compression fracture is present at T8. Acute fracture lines may be present. There is 20% loss of height anteriorly. Spinal canal stimulator is in place within the posterior central canal at T7, T8, and T9. Leads enter the spinal canal at the T10-T11 level. Review of the MIP images confirms the above findings. CTA ABDOMEN AND PELVIS FINDINGS There is no evidence of dissection. There is extensive circumferential irregular plaque with calcification within the abdominal aorta. An infrarenal abdominal aortic aneurysm measures 3.5 cm in maximal AP diameter and previously measured 3.4 cm. There is a right posterior-lateral penetrating atherosclerotic ulcer which is not significantly changed on image 102. Celiac is patent with atherosclerotic calcification at its origin. Branch vessels are patent. 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 The mid SMA reconstitutes via pancreatic collateral vessels. There is a 6 mm aneurysm within 1 of these collateral vessels on image 103 of series 5. This is located just to the left of the mid SMA. There is a second small aneurysm measuring 5 mm within the pancreatic head. See image 117. There is irregular plaque at the origin of the single right renal artery. Significant narrowing cannot be excluded. The left renal artery is diminutive. Severe narrowing at its origin is suspected There is moderate disease at the  origin of the IMA. The vessel is diminutive. Diffuse atherosclerotic plaque in the right common iliac artery without significant focal narrowing. Right external iliac artery is diminutive without significant focal narrowing. Right internal iliac artery is patent There is diffuse atherosclerotic plaque at the origin of the left common iliac artery. There is a web-like area of narrowing best seen on coronal reformats. Diffuse atherosclerotic calcification throughout the remainder of the left common iliac artery is noted. The left external iliac artery is diffusely disease without definitive significant focal narrowing. It is a diminutive vessel. Left internal iliac artery is diffusely diseased but without significant focal narrowing. Postcholecystectomy Liver, spleen, pancreas, and left adrenal gland are within normal limits. Small adenoma in the right adrenal gland is stable. Simple in the right upper pole and left lower polar stable. Other hypodensities are too small to characterize. 5 mm right lower pole calculus. No obvious focal lesion in the colon. No evidence of small-bowel obstruction. Diverticulosis of the descending and sigmoid colon. Bladder is within normal limits. Uterus is absent. Calcification in the right adnexa within the right ovary is stable in appearance. Left adnexa is within normal limits. There is no free fluid. There is no evidence of intraperitoneal or  retroperitoneal hemorrhage. 11 mm short axis diameter mesenteric node is present on image 138. In retrospect, this is stable. No evidence of abnormal retroperitoneal adenopathy. L3, L4, and L5 fusion with pedicle screws is present. Review of the MIP images confirms the above findings. IMPRESSION: There is no evidence of intramural hematoma or aortic dissection in the chest or abdomen. Maximal diameter of the thoracic aorta is 3.7 cm in the proximal descending aorta. There is extensive calcified plaque at the origin of the left common carotid artery. 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 There is no significant narrowing of the lumen Healing right antral lateral eighth rib fracture T8 wedge compression deformity in the thoracic spine. Acute compression fracture is not excluded. Bone scan can be performed as clinically indicated. Maximal diameter of the infrarenal abdominal aortic aneurysm is 3.5 cm. Recommend followup by ultrasound in 2 years. This recommendation follows ACR consensus guidelines: White Paper of the ACR Incidental Findings Committee II on Vascular Findings. J Am Coll Radiol 2013; 10:789-794. Significant narrowing at the origin of the left renal artery. 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 It reconstitutes distally through pancreatic collaterals. 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 One measures 6 mm. The other measures 5 mm. Mild mesenteric adenopathy. This is stable compared with February 8th of this year. It may be a chronic finding. Stability over 2 years supports benign etiology. There are no other prior studies for comparison. Lymphoma and indolent infections such as mycobacterium are not excluded. Consider follow-up imaging throughout 2 years to ensure stability. Electronically Signed   By: Jolaine Click M.D.    On: 09/01/2015 15:27   I have personally reviewed and evaluated these images and lab results as part of my medical decision-making.   EKG Interpretation   Date/Time:  Monday Sep 01 2015 12:14:56 EDT Ventricular Rate:  61 PR Interval:  191 QRS Duration: 82 QT Interval:  442 QTC Calculation: 445 R Axis:   62 Text Interpretation:  Sinus rhythm RSR' in V1 or V2, probably normal  variant Confirmed by ZAVITZ MD, JOSHUA [phone]) on 09/01/2015 12:25:03 PM  Also confirmed by ZAVITZ MD, JOSHUA [phone]), editor BREWER, CCT, GLENN  [phone])  on 09/01/2015 1:31:38 PM      MDM   Final diagnoses:  Atypical chest pain  Bilateral thoracic back pain    Labs: I-STAT troponin, CBC, BMP  Imaging: CT scan chest abdomen and pelvis, chest 2 view  Consults:  Therapeutics: Morphine  Discharge Meds:   Assessment/Plan: 75 year old female presents today with complaints of chest and back pain. Patient's atypical presentation does not follow-up apical patterns. Patient had repeat troponin here in the ED with no significant elevation, EKG with no concerning findings. Patient had no signs of infectious etiology, due to her significant smoking history, hyperlipidemia, documented aneurysm patient received CT angiogram chest abdomen pelvis to rule out dissection. Patient had no acute signs of dissection or acute findings. Incidentally patient was noted have a T8 compression fracture which could likely be the source of today's presentation. Patient had other incidental findings that are not significant to her presentation today. She was read the findings and encouraged follow-up with primary care for reevaluation further management of these. Patient's pain was improved while here in the ED, low suspicion for pulmonary embolism in this patient in no significant findings for PE were noted on CT. Patient's presentation is likely musculoskeletal, she will be encouraged to use Tylenol or ibuprofen, follow up with primary  care. Patient is given strict return precautions. Both the patient and her family agreed with today's evaluation and diagnosis. They understood return precautions and assured there follow-up evaluation.         Eyvonne Mechanic, PA-C 09/01/15 1636  Blane Ohara, MD 09/02/15 [phone]

## 2015-09-01 NOTE — ED Notes (Signed)
Pt arrives EMS from home with c/o chest pain since waking on FRiday. Pain increases with deep respiration and is lower chest to bilateral side.

## 2015-09-01 NOTE — ED Notes (Signed)
Patient transported to CT 

## 2015-09-11 ENCOUNTER — Ambulatory Visit (INDEPENDENT_AMBULATORY_CARE_PROVIDER_SITE_OTHER): Payer: Medicare Other | Admitting: Internal Medicine

## 2015-09-11 ENCOUNTER — Encounter: Payer: Self-pay | Admitting: Internal Medicine

## 2015-09-11 VITALS — BP 124/68 | HR 66 | Temp 97.9°F | Ht 65.0 in | Wt 132.0 lb

## 2015-09-11 DIAGNOSIS — R9389 Abnormal findings on diagnostic imaging of other specified body structures: Secondary | ICD-10-CM

## 2015-09-11 DIAGNOSIS — R938 Abnormal findings on diagnostic imaging of other specified body structures: Secondary | ICD-10-CM | POA: Diagnosis not present

## 2015-09-11 DIAGNOSIS — M810 Age-related osteoporosis without current pathological fracture: Secondary | ICD-10-CM | POA: Diagnosis not present

## 2015-09-11 DIAGNOSIS — Z09 Encounter for follow-up examination after completed treatment for conditions other than malignant neoplasm: Secondary | ICD-10-CM

## 2015-09-11 DIAGNOSIS — M8088XA Other osteoporosis with current pathological fracture, vertebra(e), initial encounter for fracture: Secondary | ICD-10-CM

## 2015-09-11 DIAGNOSIS — M8008XA Age-related osteoporosis with current pathological fracture, vertebra(e), initial encounter for fracture: Secondary | ICD-10-CM

## 2015-09-11 NOTE — Progress Notes (Signed)
Pre visit review using our clinic review tool, if applicable. No additional management support is needed unless otherwise documented below in the visit note. 

## 2015-09-11 NOTE — Progress Notes (Signed)
Subjective:    Patient ID: Sara [name], female    DOB: 07-04-1940, 75 y.o.   MRN: [id number]  DOS:  09/11/2015 Type of visit - description :  Interval history:  Went to the ER 05/08/2017Complaining of pain:   troponin negative, EKG with no acute changes, labs and CT angiogram chest abdomen pelvis --->  reviewed.   She developed intense pain on 08/29/2015, from the thoracic back radiating anteriorly to the abdomen. No fall, injury or rash. The pain is now noticeable when she tries to walk or stand up. No pain when she lays down.  At this point Hydrocodone helps some  but she   is unable to ambulate much due to pain      Review of Systems  no fever chills No nausea, vomiting, diarrhea. No abdominal pain Past Medical History  Diagnosis Date  . Mitral valve regurgitation     trace  . Hypercholesteremia   . Arthritis   . H/O hiatal hernia   . Stroke (HCC)   . Coronary artery disease     sees Dr. Patty Sermons  . Hypertension     all managed by Dr. Haynes Dage bill  . Anxiety   . Bronchitis     hx of  . GERD (gastroesophageal reflux disease)     hx of  . Carotid artery occlusion     Past Surgical History  Procedure Laterality Date  . Cardiac catheterization  1994  . Hysterotomy    . Carpal tunnel release      left  . Hand surgery      bilateral  . Cholecystectomy    . Tee without cardioversion  04/12/2012    Procedure: TRANSESOPHAGEAL ECHOCARDIOGRAM (TEE);  Surgeon: Lewayne Bunting, MD;  Location: [facility] ENDOSCOPY;  Service: Cardiovascular;  Laterality: N/A;  . Back surgery      x 3 Dr Juliene Pina, last @ [facility] ~ 2005, rods, fusion, four surgery  . Endarterectomy  04/24/2012    Procedure: ENDARTERECTOMY CAROTID;  Surgeon: Larina Earthly, MD;  Location: [facility] OR;  Service: Vascular;  Laterality: Left;  . Carotid endarterectomy    . Spinal cord stimulator insertion    . Hip pinning,cannulated Left 02/08/2015    Procedure: CANNULATED HIP PINNING;  Surgeon: Kathryne Hitch, MD;   Location: [facility] ORS;  Service: Orthopedics;  Laterality: Left;  . Hip fracture surgery Left     Social History   Social History  . Marital Status: Married    Spouse Name: N/A  . Number of Children: 4  . Years of Education: N/A   Occupational History  . retired     Social History Main Topics  . Smoking status: Former Smoker -- 0.50 packs/day for 60 years    Types: Cigarettes  . Smokeless tobacco: Never Used     Comment: quit 04-2015  . Alcohol Use: No  . Drug Use: No  . Sexual Activity: Not on file   Other Topics Concern  . Not on file   Social History Narrative   Lives w/ husband   4 daughters , 2 in [place]        Medication List       This list is accurate as of: 09/11/15 11:59 PM.  Always use your most recent med list.               amLODipine 10 MG tablet  Commonly known as:  NORVASC  TAKE 1 TABLET BY MOUTH EVERY DAY  aspirin EC 81 MG tablet  Take 81 mg by mouth daily.     atorvastatin 40 MG tablet  Commonly known as:  LIPITOR  Take 0.5 tablets (20 mg total) by mouth daily.     diazepam 5 MG tablet  Commonly known as:  VALIUM  Take 1 tablet (5 mg total) by mouth daily as needed.     gabapentin 300 MG capsule  Commonly known as:  NEURONTIN  Take 3 capsules (900 mg total) by mouth at bedtime. Or as directed Additional refills from PCP     HYDROcodone-acetaminophen 10-325 MG tablet  Commonly known as:  NORCO  Take 1 tablet by mouth every 6 (six) hours as needed for moderate pain (for pain).     metoprolol 50 MG tablet  Commonly known as:  LOPRESSOR  Take 0.5 tablets (25 mg total) by mouth 2 (two) times daily.     omeprazole 20 MG tablet  Commonly known as:  PRILOSEC OTC  Take 1 tablet (20 mg total) by mouth daily.     vitamin B-12 1000 MCG tablet  Commonly known as:  CYANOCOBALAMIN  Take 1,000 mcg by mouth daily.           Objective:   Physical Exam BP 124/68 mmHg  Pulse 66  Temp(Src) 97.9 F (36.6 C) (Oral)  Ht 5\' 5"  (1.651 m)  Wt  132 lb (59.875 kg)  BMI 21.97 kg/m2  SpO2 97% General:   Well developed, well nourished . NAD.  HEENT:  Normocephalic . Face symmetric, atraumatic Lungs:  CTA B Normal respiratory effort, no intercostal retractions, no accessory muscle use. Heart: RRR,  no murmur.  No pretibial edema bilaterally  MSK: Mildly TTP of the thoracic spine Skin: Not pale. Not jaundice Neurologic:  alert & oriented X3.  Speech normal, gait appropriate for age and unassisted Psych--  Cognition and judgment appear intact.  Cooperative with normal attention span and concentration.  Behavior appropriate. No anxious or depressed appearing.      Assessment & Plan:   Assessment Pre-diabetes-- a1c 5.9 2013 HTN Hyperlipidemia Anxiety-- rarely uses diazepam CV: --CAD, Dr. Patty Sermons --H/o Stroke 2013:non hemorrhagic infarcts scattered throughout the L Hemisphere . 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  PLAN: T8 compression fracture: Pain as described above is likely related to compression fracture, she remains quite symptomatic, unable to ambulate well due to pain. sx respond partially to hydrocodone. Recommend to see interventional radiology for possible vertebroplasty. Patient in agreement. Abnormal CT: There are several CTA findings, all of them were discussed with the patient. She does not seem to have symptoms related to peripheral vascular disease or GI vascular disease. Some CT findings are: Atherosclerotic ulcer left subclavian T 8 compression fracture Infrarenal  abdominal aortic aneurysm 3.5 cm, follow-up 2 years Narrow  left renal artery , SMA occluded with pancreatic collaterals Mild mesenteric adenopathy, possibly stable, consider 2 year follow-up  See last OV, had a R distal ureter enhancement per CT: Saw urology 07-2015, Rx a ultrasound Osteoporosis: Last bone density test 2014, T score -3.4. Check a bone density test, most  likely she will need medication. RTC 3 months   Today, I spent more than  35  min with the patient: >50% of the time counseling regards the abnormal findings on the CT, discussing the pros and cons of referring patient for vertebroplasty.

## 2015-09-11 NOTE — Patient Instructions (Signed)
GO TO THE FRONT DESK Schedule your next appointment for a  routine checkup in 3 months. Fasting

## 2015-09-12 NOTE — Assessment & Plan Note (Addendum)
  T8 compression fracture: Pain as described above is likely related to compression fracture, she remains quite symptomatic, unable to ambulate well due to pain. sx respond partially to hydrocodone. Recommend to see interventional radiology for possible vertebroplasty. Patient in agreement. Abnormal CT: There are several CTA findings, all of them were discussed with the patient. She does not seem to have symptoms related to peripheral vascular disease or GI vascular disease. 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 Check a bone density test, most likely she will need medication. RTC 3 months

## 2015-09-15 ENCOUNTER — Ambulatory Visit (HOSPITAL_BASED_OUTPATIENT_CLINIC_OR_DEPARTMENT_OTHER)
Admission: RE | Admit: 2015-09-15 | Discharge: 2015-09-15 | Disposition: A | Discharge status: Home / Self Care | Payer: Medicare Other | Source: Ambulatory Visit | Attending: Internal Medicine | Admitting: Internal Medicine

## 2015-09-15 DIAGNOSIS — M810 Age-related osteoporosis without current pathological fracture: Secondary | ICD-10-CM | POA: Insufficient documentation

## 2015-10-06 ENCOUNTER — Other Ambulatory Visit: Payer: Self-pay | Admitting: Internal Medicine

## 2015-10-07 ENCOUNTER — Other Ambulatory Visit: Payer: Self-pay | Admitting: Internal Medicine

## 2015-10-30 ENCOUNTER — Telehealth (HOSPITAL_COMMUNITY): Payer: Self-pay

## 2015-10-30 NOTE — Telephone Encounter (Signed)
Called to schedule, pt stated that she has already been treated and did not want to schedule. I told her that I would let Dr. Corliss Skainseveshwar know. Pt agreed. AW

## 2015-11-13 ENCOUNTER — Other Ambulatory Visit: Payer: Self-pay | Admitting: Internal Medicine

## 2015-11-30 ENCOUNTER — Other Ambulatory Visit: Payer: Self-pay | Admitting: Internal Medicine

## 2015-12-12 ENCOUNTER — Ambulatory Visit (INDEPENDENT_AMBULATORY_CARE_PROVIDER_SITE_OTHER): Payer: Medicare Other | Admitting: Internal Medicine

## 2015-12-12 ENCOUNTER — Encounter: Payer: Self-pay | Admitting: Internal Medicine

## 2015-12-12 VITALS — BP 122/64 | HR 74 | Temp 97.5°F | Resp 14 | Ht 65.0 in | Wt 143.0 lb

## 2015-12-12 DIAGNOSIS — I739 Peripheral vascular disease, unspecified: Secondary | ICD-10-CM | POA: Diagnosis not present

## 2015-12-12 DIAGNOSIS — A419 Sepsis, unspecified organism: Secondary | ICD-10-CM

## 2015-12-12 DIAGNOSIS — Z23 Encounter for immunization: Secondary | ICD-10-CM | POA: Diagnosis not present

## 2015-12-12 DIAGNOSIS — I10 Essential (primary) hypertension: Secondary | ICD-10-CM

## 2015-12-12 DIAGNOSIS — R739 Hyperglycemia, unspecified: Secondary | ICD-10-CM

## 2015-12-12 DIAGNOSIS — E785 Hyperlipidemia, unspecified: Secondary | ICD-10-CM | POA: Diagnosis not present

## 2015-12-12 DIAGNOSIS — G629 Polyneuropathy, unspecified: Secondary | ICD-10-CM | POA: Diagnosis not present

## 2015-12-12 LAB — LIPID PANEL
CHOL/HDL RATIO: 3
Cholesterol: 178 mg/dL (ref 0–200)
HDL: 64.8 mg/dL (ref >39.00)
LDL CALC: 91 mg/dL (ref 0–99)
NONHDL: 113.32
TRIGLYCERIDES: 114 mg/dL (ref 0.0–149.0)
VLDL: 22.8 mg/dL (ref 0.0–40.0)

## 2015-12-12 LAB — ALT: ALT: 13 U/L (ref 0–35)

## 2015-12-12 LAB — AST: AST: 12 U/L (ref 0–37)

## 2015-12-12 LAB — FOLATE: FOLATE: 11.4 ng/mL (ref >5.9)

## 2015-12-12 LAB — VITAMIN B12: VITAMIN B 12: 349 pg/mL (ref 211–911)

## 2015-12-12 LAB — HEMOGLOBIN A1C: HEMOGLOBIN A1C: 6.1 % (ref 4.6–6.5)

## 2015-12-12 MED ORDER — GABAPENTIN 600 MG PO TABS: 600.0000 mg | ORAL_TABLET | Freq: Three times a day (TID) | ORAL | 6 refills | Status: DC

## 2015-12-12 NOTE — Progress Notes (Signed)
Pre visit review using our clinic review tool, if applicable. No additional management support is needed unless otherwise documented below in the visit note. 

## 2015-12-12 NOTE — Patient Instructions (Addendum)
GO TO THE LAB : Get the blood work     GO TO THE FRONT DESK Schedule your next appointment for a  checkup in 2 months   Increase gabapentin to 600 mg 3 times a day

## 2015-12-12 NOTE — Progress Notes (Signed)
Subjective:    Patient ID: Sara BrickRebecca S [name], female    DOB: 08/02/1940, 75 y.o.   MRN: [id number]  DOS:  12/12/2015 Type of visit - description : Routine office visit Interval history: Her main concern today is neuropathy. Symptoms have been worse for the last few months, described as a deep ache throughout both legs, not particularly worse with walking, worse at night. T8 fracture: Status post vertebroplasty. Better Takes Valium as needed, needs UDS and contract. HTN: Good med compliance, ambulatory BPs within normal  Review of Systems Denies chest or difficulty breathing No nausea, vomiting, diarrhea.   Past Medical History:  Diagnosis Date  . Anxiety   . Arthritis   . Bronchitis    hx of  . Carotid artery occlusion   . Chronic pain syndrome    pain management  . Coronary artery disease    sees Dr. Patty SermonsBrackbill  . GERD (gastroesophageal reflux disease)    hx of  . H/O hiatal hernia   . Hypercholesteremia   . Hypertension    all managed by Dr. Haynes DageBrack bill  . Mitral valve regurgitation    trace  . Non-traumatic compression fracture of thoracic vertebra (HCC)    T9  . Stroke [facility](HCC)     Past Surgical History:  Procedure Laterality Date  . BACK SURGERY     x 3 Dr Juliene PinaGeoffrey, [facility], rods, fusion, four surgery  . CARDIAC CATHETERIZATION  1994  . CAROTID ENDARTERECTOMY    . CARPAL TUNNEL RELEASE     left  . CHOLECYSTECTOMY    . ENDARTERECTOMY  04/24/2012   Procedure: ENDARTERECTOMY CAROTID;  Surgeon: Larina Earthlyodd F Early, MD;  [facility];  Service: Vascular;  Laterality: Left;  . HAND SURGERY     bilateral  . HIP FRACTURE SURGERY Left   . HIP PINNING,CANNULATED Left 02/08/2015   Procedure: CANNULATED HIP PINNING;  Surgeon: Kathryne Hitchhristopher Y Blackman, MD;  Location: [facility] ORS;  Service: Orthopedics;  Laterality: Left;  . HYSTEROTOMY    . SPINAL CORD STIMULATOR INSERTION    . TEE WITHOUT CARDIOVERSION  04/12/2012   Procedure: TRANSESOPHAGEAL ECHOCARDIOGRAM (TEE);   Surgeon: Lewayne BuntingBrian S Crenshaw, MD;  [facility];  Service: Cardiovascular;  Laterality: N/A;    Social History   Social History  . Marital status: Married    Spouse name: N/A  . Number of children: 4  . Years of education: N/A   Occupational History  . retired     Social History Main Topics  . Smoking status: Former Smoker    Packs/day: 0.50    Years: 60.00    Types: Cigarettes  . Smokeless tobacco: Never Used     Comment: quit 04-2015  . Alcohol use No  . Drug use: No  . Sexual activity: Not on file   Other Topics Concern  . Not on file   Social History Narrative   Lives w/ husband   4 daughters , 2 in [place]        Medication List       Accurate as of 12/12/15 11:59 PM. Always use your most recent med list.          amLODipine 10 MG tablet Commonly known as:  NORVASC TAKE 1 TABLET BY MOUTH EVERY DAY   aspirin EC 81 MG tablet Take 81 mg by mouth daily.   atorvastatin 40 MG tablet Commonly known as:  LIPITOR Take 0.5 tablets (20 mg total) by mouth daily.   diazepam  5 MG tablet Commonly known as:  VALIUM Take 1 tablet (5 mg total) by mouth daily as needed.   gabapentin 600 MG tablet Commonly known as:  NEURONTIN Take 1 tablet (600 mg total) by mouth 3 (three) times daily.   HYDROcodone-acetaminophen 10-325 MG tablet Commonly known as:  NORCO Take 1 tablet by mouth every 6 (six) hours as needed for moderate pain (for pain).   metoprolol 50 MG tablet Commonly known as:  LOPRESSOR Take 0.5 tablets (25 mg total) by mouth 2 (two) times daily.   omeprazole 20 MG capsule Commonly known as:  PRILOSEC Take 1 capsule (20 mg total) by mouth daily.   vitamin B-12 1000 MCG tablet Commonly known as:  CYANOCOBALAMIN Take 1,000 mcg by mouth daily.          Objective:   Physical Exam BP 122/64 (BP Location: Left Arm, Patient Position: Sitting, Cuff Size: Small)   Pulse 74   Temp 97.5 F (36.4 C) (Oral)   Resp 14   Ht 5\' 5"  (1.651 m)   Wt 143  lb (64.9 kg)   SpO2 93%   BMI 23.80 kg/m  General:   Well developed, well nourished . NAD.  HEENT:  Normocephalic . Face symmetric, atraumatic Lungs:  Decreased breath sounds Normal respiratory effort, no intercostal retractions, no accessory muscle use. Heart: RRR,  no murmur.  no pretibial edema bilaterally  Absent femoral pulses, decreased pedal pulses, good capillary refills all toes. Abdomen:  No bruit Skin: Not pale. Not jaundice Neurologic:  alert & oriented X3.  Speech normal, gait appropriate for age and unassisted DTRs symmetrically decreased Psych--  Cognition and judgment appear intact.  Cooperative with normal attention span and concentration.  Behavior appropriate. No anxious or depressed appearing.    Assessment & Plan:   Assessment Pre-diabetes-- a1c 5.9 2013 HTN Hyperlipidemia Anxiety-- rarely uses diazepam CV: --CAD, Dr. Patty SermonsBrackbill --H/o Stroke 2013:non hemorrhagic infarcts scattered throughout the L Hemisphere . 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  PLAN: Prediabetes: Recheck a A1c HTN: Well-controlled, continue amlodipine 10 mg half tablet, metoprolol. Last  BMP satisfactory Hyperlipidemia: Continue Lipitor, check a FLP, AST, ALT Anxiety: Diazepam as needed, UDS and contract today Neuropathy: carries  the dx of neuropathy, sx suggestive of such, no previous workup, recently Sx increased.  DDX includes idiopathic or secondary neuropathy (d/t hyperglycemia?) , vascular insufficiency ( absent femoral pulses) , spinal  stenosis d/t significant spinal problems, statin s/e. Plan: B12, folic acid, ABIs, neuro referral ---> Further workup ?Marland Kitchen. Increase gabapentin from 900 daily to 600 3 times a day. T8 compression fracture: Had vertebroplasty, better. Primary care: Pneumonia shot RTC 2 months

## 2015-12-13 NOTE — Assessment & Plan Note (Signed)
Prediabetes: Recheck a A1c HTN: Well-controlled, continue amlodipine 10 mg half tablet, metoprolol. Last  BMP satisfactory Hyperlipidemia: Continue Lipitor, check a FLP, AST, ALT Anxiety: Diazepam as needed, UDS and contract today Neuropathy: carries  the dx of neuropathy, sx suggestive of such, no previous workup, recently Sx increased.  DDX includes idiopathic or secondary neuropathy (d/t hyperglycemia?) , vascular insufficiency ( absent femoral pulses) , spinal stenosis d/t significant spinal problems, statin s/e. Plan: B12, folic acid, ABIs, neuro referral ---> Further workup ?Marland Kitchen. Increase gabapentin from 900 daily to 600 3 times a day. T8 compression fracture: Had vertebroplasty, better. Primary care: Pneumonia shot RTC 2 months

## 2015-12-24 ENCOUNTER — Other Ambulatory Visit: Payer: Self-pay | Admitting: Internal Medicine

## 2015-12-24 DIAGNOSIS — R200 Anesthesia of skin: Secondary | ICD-10-CM

## 2015-12-30 ENCOUNTER — Ambulatory Visit (HOSPITAL_COMMUNITY)
Admission: RE | Admit: 2015-12-30 | Discharge: 2015-12-30 | Disposition: A | Discharge status: Home / Self Care | Payer: Medicare Other | Source: Ambulatory Visit | Attending: Internal Medicine | Admitting: Internal Medicine

## 2015-12-30 ENCOUNTER — Other Ambulatory Visit: Payer: Self-pay | Admitting: Internal Medicine

## 2015-12-30 DIAGNOSIS — I708 Atherosclerosis of other arteries: Secondary | ICD-10-CM | POA: Insufficient documentation

## 2015-12-30 DIAGNOSIS — I739 Peripheral vascular disease, unspecified: Secondary | ICD-10-CM

## 2015-12-30 DIAGNOSIS — I10 Essential (primary) hypertension: Secondary | ICD-10-CM | POA: Diagnosis not present

## 2015-12-30 DIAGNOSIS — I2510 Atherosclerotic heart disease of native coronary artery without angina pectoris: Secondary | ICD-10-CM | POA: Diagnosis not present

## 2015-12-30 DIAGNOSIS — R938 Abnormal findings on diagnostic imaging of other specified body structures: Secondary | ICD-10-CM | POA: Diagnosis not present

## 2015-12-30 DIAGNOSIS — I77811 Abdominal aortic ectasia: Secondary | ICD-10-CM | POA: Insufficient documentation

## 2015-12-30 DIAGNOSIS — E785 Hyperlipidemia, unspecified: Secondary | ICD-10-CM | POA: Insufficient documentation

## 2015-12-30 DIAGNOSIS — Z87891 Personal history of nicotine dependence: Secondary | ICD-10-CM | POA: Diagnosis not present

## 2015-12-30 DIAGNOSIS — R200 Anesthesia of skin: Secondary | ICD-10-CM | POA: Diagnosis not present

## 2015-12-30 DIAGNOSIS — R209 Unspecified disturbances of skin sensation: Secondary | ICD-10-CM | POA: Diagnosis not present

## 2016-01-01 ENCOUNTER — Other Ambulatory Visit: Payer: Self-pay | Admitting: Neurology

## 2016-01-01 ENCOUNTER — Other Ambulatory Visit: Payer: Medicare Other

## 2016-01-01 ENCOUNTER — Encounter: Payer: Self-pay | Admitting: Neurology

## 2016-01-01 ENCOUNTER — Ambulatory Visit (INDEPENDENT_AMBULATORY_CARE_PROVIDER_SITE_OTHER): Payer: Medicare Other | Admitting: Neurology

## 2016-01-01 VITALS — BP 140/90 | HR 85 | Ht 65.0 in | Wt 148.1 lb

## 2016-01-01 DIAGNOSIS — I739 Peripheral vascular disease, unspecified: Secondary | ICD-10-CM | POA: Diagnosis not present

## 2016-01-01 DIAGNOSIS — M792 Neuralgia and neuritis, unspecified: Secondary | ICD-10-CM

## 2016-01-01 DIAGNOSIS — G609 Hereditary and idiopathic neuropathy, unspecified: Secondary | ICD-10-CM

## 2016-01-01 MED ORDER — LIDOCAINE 5 % EX OINT: 1.0000 "application " | TOPICAL_OINTMENT | Freq: Two times a day (BID) | CUTANEOUS | 5 refills | Status: DC | PRN

## 2016-01-01 NOTE — Progress Notes (Signed)
[facility] Clinic Note - Initial Visit   Date: 01/01/16  RONDA RAJKUMAR MRN: [id number] DOB: Aug 25, 1940   Dear Dr. Larose Kells:  Thank you for your kind referral of TIYANNA [name] for consultation of neuropathy. Although her history is well known to you, please allow [name] to reiterate it for the purpose of our medical record. The patient was accompanied to the clinic by daughter who also provides collateral information.     History of Present Illness: Sara [name] is a 75 y.o. right-handed Caucasian female with stroke, hypertension, hyperlipidemia, CAD, stroke with residual right hand weakness, left carotid stenosis s/p L CEA, chronic low back pain s/p lumbar fusion and s/p spinal cord stimulator, and anxiety presenting for evaluation of bilateral feet pain.    Starting around 2012, she began having burning sensation of the feet which is worse at night.  It prevents her from sleeping.  She has been taking gabapentin '600mg'$  in the morning and '1200mg'$  at bedtime which helps.  She endorses imbalance and broke her left hip in November 2016.  She has generalized weakness and difficulty with walking.  Some of this is due to chronic low back pain which is followed by Dr. Vira Blanco in Pain Management.  She has achy pain of the legs with walking, which is improved with rest.  Her arterial studies of the legs show peripheral vascular disease with likely occlusoin of the left common iliac artery as well as > 50% right common iliac and bilateral external iliac arteries.  She takes aspirin '81mg'$  daily.   She quit smoking earlier this year. No history of diabetes, alcohol, or family history of neuropathy.  Out-side paper records, electronic medical record, and images have been reviewed where available and summarized as:  Lab Results  Component Value Date   TSH 1.684 03/19/2015   Lab Results  Component Value Date   HGDJMEQA83 419 12/12/2015   Lab Results  Component Value Date   HGBA1C 6.1 12/12/2015   MRI lumbar spine 09/25/2013:  1. Solid fusion at L3-4 and L4-5 without significant residual or recurrent stenosis. Transitional anatomy at L5. 2. Leftward disc bulging and asymmetric facet hypertrophy at L1-2 with mild left lateral recess narrowing. 3. Leftward disc bulging and mild bilateral facet hypertrophy without significant stenosis.   Past Medical History:  Diagnosis Date  . Anxiety   . Arthritis   . Bronchitis    hx of  . Carotid artery occlusion   . Chronic pain syndrome    pain management  . Coronary artery disease    sees Dr. Mare Ferrari  . GERD (gastroesophageal reflux disease)    hx of  . H/O hiatal hernia   . Hypercholesteremia   . Hypertension    all managed by Dr. Guss Bunde bill  . Mitral valve regurgitation    trace  . Non-traumatic compression fracture of thoracic vertebra (HCC)    T9  . Stroke [facility])     Past Surgical History:  Procedure Laterality Date  . BACK SURGERY     x 3 Dr Cay Schillings, last @ [facility] ~ 2005, rods, fusion, four surgery  . CARDIAC CATHETERIZATION  1994  . CAROTID ENDARTERECTOMY    . CARPAL TUNNEL RELEASE     left  . CHOLECYSTECTOMY    . ENDARTERECTOMY  04/24/2012   Procedure: ENDARTERECTOMY CAROTID;  Surgeon: Rosetta Posner, MD;  Location: [facility];  Service: Vascular;  Laterality: Left;  . HAND SURGERY     bilateral  . HIP  FRACTURE SURGERY Left   . HIP PINNING,CANNULATED Left 02/08/2015   Procedure: CANNULATED HIP PINNING;  Surgeon: Mcarthur Rossetti, MD;  Location: [facility];  Service: Orthopedics;  Laterality: Left;  . HYSTEROTOMY    . SPINAL CORD STIMULATOR INSERTION    . TEE WITHOUT CARDIOVERSION  04/12/2012   Procedure: TRANSESOPHAGEAL ECHOCARDIOGRAM (TEE);  Surgeon: Lelon Perla, MD;  Location: [facility];  Service: Cardiovascular;  Laterality: N/A;     Medications:  Outpatient Encounter Prescriptions as of 01/01/2016  Medication Sig Note  . amLODipine (NORVASC) 10 MG tablet TAKE 1 TABLET BY MOUTH  EVERY DAY (Patient taking differently: TAKE 1/2 TABLET BY MOUTH EVERY DAY)   . aspirin EC 81 MG tablet Take 81 mg by mouth daily.   . diazepam (VALIUM) 5 MG tablet Take 1 tablet (5 mg total) by mouth daily as needed. (Patient taking differently: Take 2.5 mg by mouth at bedtime as needed (sleep). ) 09/11/2015: PRN  . gabapentin (NEURONTIN) 600 MG tablet Take 1 tablet (600 mg total) by mouth 3 (three) times daily.   Marland Kitchen HYDROcodone-acetaminophen (NORCO) 10-325 MG tablet Take 1 tablet by mouth every 6 (six) hours as needed for moderate pain (for pain).   . metoprolol (LOPRESSOR) 50 MG tablet Take 0.5 tablets (25 mg total) by mouth 2 (two) times daily.   Marland Kitchen omeprazole (PRILOSEC) 20 MG capsule Take 1 capsule (20 mg total) by mouth daily.   . vitamin B-12 (CYANOCOBALAMIN) 1000 MCG tablet Take 1,000 mcg by mouth daily.   . [DISCONTINUED] atorvastatin (LIPITOR) 40 MG tablet Take 0.5 tablets (20 mg total) by mouth daily.    No facility-administered encounter medications on file as of 01/01/2016.      Allergies:  Allergies  Allergen Reactions  . Percocet [Oxycodone-Acetaminophen] Rash  . Hydrochlorothiazide     Pt does not remember  . Indomethacin     Pt does not remember  . Paroxetine Hcl     Pt does not remember  . Penicillins     Has patient had a PCN reaction causing immediate rash, facial/tongue/throat swelling, SOB or lightheadedness with hypotension: Yes Has patient had a PCN reaction causing severe rash involving mucus membranes or skin necrosis: No Has patient had a PCN reaction that required hospitalization No Has patient had a PCN reaction occurring within the last 10 years: No If all of the above answers are "NO", then may proceed with Cephalosporin use.   . Pravachol     Pt does not remember  . Quinine Derivatives     Pt does not remember  . Wellbutrin [Bupropion]     agitation  . Zocor [Simvastatin - High Dose] Other (See Comments)    Weakness/no energy  . Morphine And Related  Rash    Family History: Family History  Problem Relation Age of Onset  . Heart attack Father   . Heart disease Father     before age 54  . Heart disease Sister   . Cancer Sister   . Heart disease Sister     Social History: Social History  Substance Use Topics  . Smoking status: Former Smoker    Packs/day: 0.50    Years: 60.00    Types: Cigarettes  . Smokeless tobacco: Never Used     Comment: quit 04-2015  . Alcohol use No   Social History   Social History Narrative   Lives w/ husband in a one story home.     4 daughters , 2 in [place]  Retired: Educational psychologist.   Education: high school    Review of Systems:  CONSTITUTIONAL: No fevers, chills, night sweats, or weight loss.   EYES: No visual changes or eye pain ENT: No hearing changes.  No history of nose bleeds.   RESPIRATORY: No cough, wheezing and shortness of breath.   CARDIOVASCULAR: Negative for chest pain, and palpitations.   GI: Negative for abdominal discomfort, blood in stools or black stools.  No recent change in bowel habits.   GU:  No history of incontinence.   MUSCLOSKELETAL: +history of joint pain or swelling.  No myalgias.   SKIN: Negative for lesions, rash, and itching.   HEMATOLOGY/ONCOLOGY: Negative for prolonged bleeding, bruising easily, and swollen nodes.  No history of cancer.   ENDOCRINE: Negative for cold or heat intolerance, polydipsia or goiter.   PSYCH:  +depression or anxiety symptoms.   NEURO: As Above.   Vital Signs:  BP 140/90   Pulse 85   Ht '5\' 5"'$  (1.651 m)   Wt 148 lb 2 oz (67.2 kg)   SpO2 96%   BMI 24.65 kg/m    General Medical Exam:   General:  Well appearing, comfortable.   Eyes/ENT: see cranial nerve examination.   Neck: No masses appreciated.  Full range of motion without tenderness.  No carotid bruits. Respiratory:  Clear to auscultation, good air entry bilaterally.   Cardiac:  Regular rate and rhythm, no murmur.   Extremities:  No deformities, edema, or skin discoloration.    Skin:  No rashes or lesions.  Neurological Exam: MENTAL STATUS including orientation to time, place, person, recent and remote memory, attention span and concentration, language, and fund of knowledge is normal.  Speech is not dysarthric.  CRANIAL NERVES: II:  No visual field defects.  Unremarkable fundi.   III-IV-VI: Pupils equal round and reactive to light.  Normal conjugate, extra-ocular eye movements in all directions of gaze.  No nystagmus. Left ptosis (old).   V:  Normal facial sensation.  VII:  Normal facial symmetry and movements.   VIII:  Normal hearing and vestibular function.   IX-X:  Normal palatal movement.   XI:  Normal shoulder shrug and head rotation.   XII:  Normal tongue strength and range of motion, no deviation or fasciculation.  MOTOR:  No atrophy, fasciculations or abnormal movements.  No pronator drift.  Tone is normal.    Right Upper Extremity:    Left Upper Extremity:    Deltoid  5/5   Deltoid  5/5   Biceps  5/5   Biceps  5/5   Triceps  5/5   Triceps  5/5   Wrist extensors  5/5   Wrist extensors  5/5   Wrist flexors  5/5   Wrist flexors  5/5   Finger extensors  5/5   Finger extensors  5/5   Finger flexors  5/5   Finger flexors  5/5   Dorsal interossei  5/5   Dorsal interossei  5/5   Abductor pollicis  5/5   Abductor pollicis  5/5   Tone (Ashworth scale)  0  Tone (Ashworth scale)  0   Right Lower Extremity:    Left Lower Extremity:    Hip flexors  5/5   Hip flexors  5/5   Hip extensors  5/5   Hip extensors  5/5   Knee flexors  5/5   Knee flexors  5/5   Knee extensors  5/5   Knee extensors  5/5   Dorsiflexors  5/5  Dorsiflexors  5/5   Plantarflexors  5/5   Plantarflexors  5/5   Toe extensors  5/5   Toe extensors  5/5   Toe flexors  5/5   Toe flexors  5/5   Tone (Ashworth scale)  0  Tone (Ashworth scale)  0   MSRs:  Right                                                                 Left brachioradialis 2+  brachioradialis 2+  biceps 2+  biceps 2+   triceps 2+  triceps 2+  patellar 2+  patellar 2+  ankle jerk 0  ankle jerk 0  Hoffman no  Hoffman no  plantar response down  plantar response down   SENSORY:  Reduced temperature distal to ankles and reduced vibration over the left ankle only. Light touch intact. There is mild sway with Rhomberg testing.  COORDINATION/GAIT: Normal finger-to- nose-finger.  Intact rapid alternating movements bilaterally.  Able to rise from a chair without using arms.  Gait slightly wide-based, slow, and unsteady at times.  She can perform stressed gait, but unable to perform tandem gait.   IMPRESSION: 1.  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  Peripheral vascular disease causing claudication of the lower extremities 3.  Chronic low back pain, followed by Dr. Vira Blanco 4.  Multifactorial gait disorder 5.  Former tobacco use, quit early 2017   PLAN/RECOMMENDATIONS:  1.  NCS/EMG of the legs 2.  Check copper, SPEP with IFE, ESR, SSA/B, MMA 3.  Continue gabapentin '600mg'$  in the morning and increase bedtime gabapentin to '900mg'$  for one week.      If no improvement after one week, increase bedtime gabapentin to '1200mg'$   4.  Start lidocaine ointment to feet 5.  Fall precautions discussed and strongly encouraged to use a cane  Return to clinic in 4 months.   The duration of this appointment visit was 45 minutes of face-to-face time with the patient.  Greater than 50% of this time was spent in counseling, explanation of diagnosis, planning of further management, and coordination of care.   Thank you for allowing me to participate in patient's care.  If I can answer any additional questions, I would be pleased to do so.    Sincerely,    Donika K. Posey Pronto, DO

## 2016-01-01 NOTE — Patient Instructions (Addendum)
1.  NCS/EMG of the legs 2.  Check blood work 3.  Continue gabapentin 600mg  in the morning and increase bedtime gabapentin to 900mg  for one week.      If no improvement after one week, increase your bedtime gabapentin to 1200mg   4.  Start lidocaine ointment to your feet 5.  Please start using a cane or walker for your balance.  Let me know if you would like to start physical therapy  Return to clinic 4 months  [facility]  Preventing Falls in the Home   Falls are common, often dreaded events in the lives of older people. Aside from the obvious injuries and even death that may result, falls can cause wide-ranging consequences including loss of independence, mental decline, decreased activity, and mobility. Younger people are also at risk of falling, especially those with chronic illnesses and fatigue.  Ways to reduce the risk for falling:  * Examine diet and medications. Warm foods and alcohol dilate blood vessels, which can lead to dizziness when standing. Sleep aids, antidepressants, and pain medications can also increase the likelihood of a fall.  * Get a vison exam. Poor vision, cataracts, and glaucoma increase the chances of falling.  * Check foot gear. Shoes should fit snugly and have a sturdy, nonskid sole and broad, low heel.  * Participate in a physician-approved exercise program to build and maintain muscle strength and improve balance and coordination.  * Increase vitamin D intake. Vitamin D improves muscle strength and increases the amount of calcium the body is able to absorb and deposit in bones.  How to prevent falls from common hazards:  * Floors - Remove all loose wires, cords, and throw rugs. Minimize clutter. Make sure rugs are anchored and smooth. Keep furniture in its usual place.  * Chairs - Use chairs with straight backs, armrests, and firm seats. Add firm cushions to existing pieces to add height.  * Bathroom - Install grab bars and non-skid tape in the tub or  shower. Use a bathtub transfer bench or a shower chair with a back support. Use an elevated toilet seat and/or safety rails to assist standing from a low surface. Do not use towel racks or bathroom tissue holders to help you stand.  * Lighting - Make sure halls, stairways, and entrances are well-lit. Install a night light in your bathroom or hallway. Make sure there is a light switch at the top and bottom of the staircase. Turn lights on if you get up in the middle of the night. Make sure lamps or light switches are within reach of the bed if you have to get up during the night.  * Kitchen - Install non-skid rubber mats near the sink and stove. Clean spills immediately. Store frequently used utensils, pots, and pans between waist and eye level. This helps prevent reaching and bending. Sit when getting things out of the lower cupboards.  * Living room / Bedrooms - Place furniture with wide spaces in between, giving enough room to move around. Establish a route through the living room that gives you something to hold onto as you walk.  * Stairs - Make sure treads, rails, and rugs are secure. Install a rail on both sides of the stairs. If stairs are a threat, it might be helpful to arrange most of your activities on the lower level to reduce the number of times you must climb the stairs.  * Entrances and doorways - Install metal handles on the walls adjacent to  the doorknobs of all doors to make it more secure as you travel through the doorway.  Tips for maintaining balance:  * Keep at least one hand free at all times Try using a backpack or fanny pack to hold things rather than carrying them in your hands. Never carry objects in both hands when walking as this interferes with keeping your balance.  * Attempt to swing both arms from front to back while walking. This might require a conscious effort if Parkinson's disease has diminished your movement. It will, however, help you to maintain balance and posture,  and reduce fatigue.  * Consciously lift your feet off the ground when walking. Shuffling and dragging of the feet is a common culprit in losing your balance.  * When trying to navigate turns, use a "U" technique of facing forward and making a wide turn, rather than pivoting sharply.  * Try to stand with your feet shoulder-length apart. When your feet are close together for any length of time, you increase your risk of losing your balance and falling.  * Do one thing at a time. Do not try to walk and accomplish another task, such as reading or looking around. The decrease in your automatic reflexes complicates motor function, so the less distraction, the better.  * Do not wear rubber or gripping soled shoes, they might "catch" on the floor and cause tripping.  * Move slowly when changing positions. Use deliberate, concentrated movements and, if needed, use a grab bar or walking aid. Count fifteen (15) seconds after standing to begin walking.  * If balance is a continuous problem, you might want to consider a walking aid such as a cane, walking stick, or walker. Once you have mastered walking with help, you may be ready to try it again on your own.  This information is provided by [facility] and is not intended to replace the medical advice of your physician or other health care providers. Please consult your physician or other health care providers for advice regarding your specific medical condition.

## 2016-01-02 ENCOUNTER — Telehealth: Payer: Self-pay | Admitting: Family

## 2016-01-02 DIAGNOSIS — I739 Peripheral vascular disease, unspecified: Secondary | ICD-10-CM

## 2016-01-02 LAB — SJOGREN'S SYNDROME ANTIBODS(SSA + SSB)
SSA (Ro) (ENA) Antibody, IgG: <1
SSB (LA) (ENA) ANTIBODY, IGG: NEGATIVE

## 2016-01-02 NOTE — Telephone Encounter (Signed)
Covering for Dr. Drue NovelPaz, reviewed vascular studies. Shows some decreased circulation to her legs. Recommend that she meet with the vascular specialist for further recommendations.

## 2016-01-02 NOTE — Telephone Encounter (Signed)
Advised pt of below recommendation. Pt states she saw neurology yesterday and they are doing additional tests on her legs next week because of her leg weakness. Pt is unsure of what test she is going to have but wanted to make sure vascular referral still needed. Advised her neurology is evaluating from a different standpoint and if there are any further recommendations I will call her back. If no new/different recommendation then she will be contacted by Vascular surgeon. Pt voices understanding. Please advise?

## 2016-01-02 NOTE — Telephone Encounter (Signed)
I still recommend vascular consult.

## 2016-01-02 NOTE — Telephone Encounter (Signed)
Pt aware.

## 2016-01-05 LAB — METHYLMALONIC ACID, SERUM: Methylmalonic Acid, Quant: 143 nmol/L (ref 87–318)

## 2016-01-05 LAB — PROTEIN ELECTROPHORESIS, SERUM
ALBUMIN ELP: 3.9 g/dL (ref 3.8–4.8)
ALPHA-2-GLOBULIN: 1 g/dL — AB (ref 0.5–0.9)
Alpha-1-Globulin: 0.4 g/dL — ABNORMAL HIGH (ref 0.2–0.3)
BETA 2: 0.4 g/dL (ref 0.2–0.5)
BETA GLOBULIN: 0.5 g/dL (ref 0.4–0.6)
Gamma Globulin: 0.8 g/dL (ref 0.8–1.7)
Total Protein, Serum Electrophoresis: 7 g/dL (ref 6.1–8.1)

## 2016-01-05 LAB — IMMUNOFIXATION ELECTROPHORESIS
IGM, SERUM: 100 mg/dL (ref 48–271)
IgA: 190 mg/dL (ref 81–463)
IgG (Immunoglobin G), Serum: 836 mg/dL (ref 694–1618)

## 2016-01-05 LAB — COPPER, SERUM: COPPER: 115 ug/dL (ref 72–166)

## 2016-01-08 ENCOUNTER — Ambulatory Visit (INDEPENDENT_AMBULATORY_CARE_PROVIDER_SITE_OTHER): Payer: Medicare Other | Admitting: Neurology

## 2016-01-08 ENCOUNTER — Telehealth: Payer: Self-pay | Admitting: Neurology

## 2016-01-08 DIAGNOSIS — G609 Hereditary and idiopathic neuropathy, unspecified: Secondary | ICD-10-CM

## 2016-01-08 DIAGNOSIS — M792 Neuralgia and neuritis, unspecified: Secondary | ICD-10-CM

## 2016-01-08 NOTE — Telephone Encounter (Signed)
Results of EMG discussed with patient which shows purely sensory polyneuropathy affecting the legs. Her labs are also within normal limits, except for a poorly restricted band seen on her serum electrophoresis. I will recheck and SPEP and UPEP with IFE at her follow-up visit.  She also mentioned that lidocaine ointment was too expensive and started using over-the-counter Aspercreme.  Maikol Grassia K. Allena KatzPatel, DO

## 2016-01-08 NOTE — Procedures (Signed)
[facility]  [address] [place], [address]  [place], [place] [address] Tel: [phone] Fax:  [phone] Test Date:  01/08/2016  Patient: Sara [name] DOB: 1941-03-06 Physician: Nita Sickle, DO  Sex: Female Height: 5\' 5"  Ref Phys: Nita Sickle, DO  ID#: [id number] Temp: 32.9C Technician: Judie Petit. Dean   Patient Complaints: This is a 75 year old female referred for evaluation of bilateral feet paresthesias.  NCV & EMG Findings: Extensive electrodiagnostic testing of the left lower extremity and additional studies of the right shows: 1. Bilateral sural and superficial sensory responses are absent. 2. Bilateral tibial and peroneal motor responses are within normal limits. 3. Bilateral tibial H reflex studies are within normal limits. 4. There is no evidence of active or chronic motor axon loss changes affecting any of the tested muscles. Motor unit configuration and recruitment pattern is within normal limits.  Impression: The electrophysiologic testing is most consistent with a purely sensory, axonal polyneuropathy affecting the lower extremities.   ___________________________ Nita Sickle, DO    Nerve Conduction Studies Anti Sensory Summary Table   Site NR Peak (ms) Norm Peak (ms) P-T Amp (V) Norm P-T Amp  Left Sup Peroneal Anti Sensory (Ant Lat Mall)  32.9C  12 cm NR  <4.4  >6  Right Sup Peroneal Anti Sensory (Ant Lat Mall)  32.9C  12 cm NR  <4.4  >6  Left Sural Anti Sensory (Lat Mall)  32.9C  Calf NR  <4.4  >6  Right Sural Anti Sensory (Lat Mall)  32.9C  Calf NR  <4.4  >6   Motor Summary Table   Site NR Onset (ms) Norm Onset (ms) O-P Amp (mV) Norm O-P Amp Site1 Site2 Delta-0 (ms) Dist (cm) Vel (m/s) Norm Vel (m/s)  Left Peroneal Motor (Ext Dig Brev)  32.9C  Ankle    3.9 <5.5 3.4 >3 B Fib Ankle 6.6 31.0 47 >41  B Fib    10.5  3.1  Poplt B Fib 1.9 10.0 53 >41  Poplt    12.4  3.1         Right Peroneal Motor (Ext Dig Brev)  32.9C  Ankle    4.8 <5.5 3.3 >3  B Fib Ankle 6.2 29.0 47 >41  B Fib    11.0  3.2  Poplt B Fib 2.2 10.0 45 >41  Poplt    13.2  3.2         Left Tibial Motor (Abd Hall Brev)  32.9C  Ankle    3.8 <5.8 9.2 >8 Knee Ankle 8.4 38.0 45 >41  Knee    12.2  6.5         Right Tibial Motor (Abd Hall Brev)  32.9C  Ankle    3.7 <5.8 9.4 >8 Knee Ankle 8.6 37.0 43 >41  Knee    12.3  7.5          H Reflex Studies   NR H-Lat (ms) Lat Norm (ms) L-R H-Lat (ms) M-Lat (ms) HLat-MLat (ms)  Left Tibial (Gastroc)  32.9C     34.83 <35 1.09 3.81 31.02  Right Tibial (Gastroc)  32.9C     33.74 <35 1.09 3.81 29.93   EMG   Side Muscle Ins Act Fibs Psw Fasc Number Recrt Dur Dur. Amp Amp. Poly Poly. Comment  Right Gastroc Nml Nml Nml Nml Nml Nml Nml Nml Nml Nml Nml Nml N/A  Right AntTibialis Nml Nml Nml Nml Nml Nml Nml Nml Nml Nml Nml Nml N/A  Right Flex Dig Long Nml  Nml Nml Nml Nml Nml Nml Nml Nml Nml Nml Nml N/A  Left AntTibialis Nml Nml Nml Nml Nml Nml Nml Nml Nml Nml Nml Nml N/A  Left Gastroc Nml Nml Nml Nml Nml Nml Nml Nml Nml Nml Nml Nml N/A  Left Flex Dig Long Nml Nml Nml Nml Nml Nml Nml Nml Nml Nml Nml Nml N/A  Left RectFemoris Nml Nml Nml Nml Nml Nml Nml Nml Nml Nml Nml Nml N/A  Left GluteusMed Nml Nml Nml Nml Nml Nml Nml Nml Nml Nml Nml Nml N/A  Left BicepsFemS Nml Nml Nml Nml Nml Nml Nml Nml Nml Nml Nml Nml N/A      Waveforms:

## 2016-01-26 ENCOUNTER — Other Ambulatory Visit: Payer: Self-pay | Admitting: Internal Medicine

## 2016-02-11 ENCOUNTER — Telehealth: Payer: Self-pay

## 2016-02-11 ENCOUNTER — Ambulatory Visit (HOSPITAL_BASED_OUTPATIENT_CLINIC_OR_DEPARTMENT_OTHER)
Admission: RE | Admit: 2016-02-11 | Discharge: 2016-02-11 | Disposition: A | Discharge status: Home / Self Care | Payer: Medicare Other | Source: Ambulatory Visit | Attending: Internal Medicine | Admitting: Internal Medicine

## 2016-02-11 ENCOUNTER — Ambulatory Visit (INDEPENDENT_AMBULATORY_CARE_PROVIDER_SITE_OTHER): Payer: Medicare Other | Admitting: Internal Medicine

## 2016-02-11 ENCOUNTER — Encounter: Payer: Self-pay | Admitting: Internal Medicine

## 2016-02-11 VITALS — BP 124/68 | HR 88 | Temp 97.7°F | Resp 14 | Ht 65.0 in | Wt 150.1 lb

## 2016-02-11 DIAGNOSIS — G629 Polyneuropathy, unspecified: Secondary | ICD-10-CM

## 2016-02-11 DIAGNOSIS — M546 Pain in thoracic spine: Secondary | ICD-10-CM

## 2016-02-11 DIAGNOSIS — E7800 Pure hypercholesterolemia, unspecified: Secondary | ICD-10-CM

## 2016-02-11 DIAGNOSIS — I10 Essential (primary) hypertension: Secondary | ICD-10-CM

## 2016-02-11 DIAGNOSIS — Z23 Encounter for immunization: Secondary | ICD-10-CM

## 2016-02-11 MED ORDER — GABAPENTIN 600 MG PO TABS: 600.0000 mg | ORAL_TABLET | Freq: Four times a day (QID) | ORAL | 6 refills | Status: DC

## 2016-02-11 MED ORDER — LIDOCAINE 5 % EX PTCH: 2.0000 | MEDICATED_PATCH | CUTANEOUS | 6 refills | Status: DC

## 2016-02-11 NOTE — Progress Notes (Signed)
Pre visit review using our clinic review tool, if applicable. No additional management support is needed unless otherwise documented below in the visit note. 

## 2016-02-11 NOTE — Progress Notes (Signed)
Subjective:    Patient ID: Denton BrickRebecca S Prevost, female    DOB: 04/16/1941, 75 y.o.   MRN: [id number]  DOS:  02/11/2016 Type of visit - description : f/u Interval history:  Neuropathy: Note from neurology and nerve conduction study reports reviewed. Overall symptoms and the lower extremities has improved with gabapentin. HTN: Good BP today, good compliance of medication Reports pain at the left mid back radiating to the side and anteriorly. Pain is steady and worse when she moves around. Symptoms started approximately 4 weeks ago, feels that the area may be slightly puffy.  ROS: No fever chills No difficulty breathing with activities of daily living No cough or sputum production Mobility is limited, has a cane and a Rollator but does not use them.  Marland Kitchen. ems   Past Medical History:  Diagnosis Date  . Anxiety   . Arthritis   . Bronchitis    hx of  . Carotid artery occlusion   . Chronic pain syndrome    pain management  . Coronary artery disease    sees Dr. Patty SermonsBrackbill  . GERD (gastroesophageal reflux disease)    hx of  . H/O hiatal hernia   . Hypercholesteremia   . Hypertension    all managed by Dr. Haynes DageBrack bill  . Mitral valve regurgitation    trace  . Non-traumatic compression fracture of thoracic vertebra (HCC)    T9  . Stroke [facility](HCC)     Past Surgical History:  Procedure Laterality Date  . BACK SURGERY     x 3 Dr Juliene PinaGeoffrey, [facility], rods, fusion, four surgery  . CARDIAC CATHETERIZATION  1994  . CAROTID ENDARTERECTOMY    . CARPAL TUNNEL RELEASE     left  . CHOLECYSTECTOMY    . ENDARTERECTOMY  04/24/2012   Procedure: ENDARTERECTOMY CAROTID;  Surgeon: Larina Earthlyodd F Early, MD;  [facility];  Service: Vascular;  Laterality: Left;  . HAND SURGERY     bilateral  . HIP FRACTURE SURGERY Left   . HIP PINNING,CANNULATED Left 02/08/2015   Procedure: CANNULATED HIP PINNING;  Surgeon: Kathryne Hitchhristopher Y Blackman, MD;  Location: [facility] ORS;  Service: Orthopedics;  Laterality:  Left;  . HYSTEROTOMY    . SPINAL CORD STIMULATOR INSERTION    . TEE WITHOUT CARDIOVERSION  04/12/2012   Procedure: TRANSESOPHAGEAL ECHOCARDIOGRAM (TEE);  Surgeon: Lewayne BuntingBrian S Crenshaw, MD;  [facility];  Service: Cardiovascular;  Laterality: N/A;    Social History   Social History  . Marital status: Married    Spouse name: N/A  . Number of children: 4  . Years of education: N/A   Occupational History  . retired     Social History Main Topics  . Smoking status: Former Smoker    Packs/day: 0.50    Years: 60.00    Types: Cigarettes  . Smokeless tobacco: Never Used     Comment: quit 04-2015  . Alcohol use No  . Drug use: No  . Sexual activity: Not on file   Other Topics Concern  . Not on file   Social History Narrative   Lives w/ husband in a one story home.     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   Education: high school        Medication List       Accurate as of 02/11/16  1:58 PM. Always use your most recent med list.          amLODipine 10 MG  tablet Commonly known as:  NORVASC TAKE 1 TABLET BY MOUTH EVERY DAY   aspirin EC 81 MG tablet Take 81 mg by mouth daily.   atorvastatin 40 MG tablet Commonly known as:  LIPITOR Take 0.5 tablets (20 mg total) by mouth daily.   diazepam 5 MG tablet Commonly known as:  VALIUM Take 1 tablet (5 mg total) by mouth daily as needed.   gabapentin 600 MG tablet Commonly known as:  NEURONTIN Take 1 tablet (600 mg total) by mouth 3 (three) times daily.   HYDROcodone-acetaminophen 10-325 MG tablet Commonly known as:  NORCO Take 1 tablet by mouth every 6 (six) hours as needed for moderate pain (for pain).   metoprolol 50 MG tablet Commonly known as:  LOPRESSOR Take 0.5 tablets (25 mg total) by mouth 2 (two) times daily.   omeprazole 20 MG capsule Commonly known as:  PRILOSEC Take 1 capsule (20 mg total) by mouth daily.   vitamin B-12 1000 MCG tablet Commonly known as:  CYANOCOBALAMIN Take 1,000  mcg by mouth daily.          Objective:   Physical Exam BP 124/68 (BP Location: Left Arm, Patient Position: Sitting, Cuff Size: Small)   Pulse 88   Temp 97.7 F (36.5 C) (Oral)   Resp 14   Ht 5\' 5"  (1.651 m)   Wt 150 lb 2 oz (68.1 kg)   SpO2 98%   BMI 24.98 kg/m  General:   Well developed, well nourished . NAD.  HEENT:  Normocephalic . Face symmetric, atraumatic Lungs:  Decreased breath sounds but clear Normal respiratory effort, no intercostal retractions, no accessory muscle use. Heart: RRR,  no murmur.  No pretibial edema bilaterally  MSK: No TTP at the thoracic spine, upper back is palpated, symmetric, no masses. No rash. Skin: Not pale. Not jaundice Neurologic:  alert & oriented X3.  Speech normal, gait and mobility are limited, unassisted. Psych--  Cognition and judgment appear intact.  Cooperative with normal attention span and concentration.  Behavior appropriate. No anxious or depressed appearing.      Assessment & Plan:    Assessment Pre-diabetes-- a1c 5.9 2013 Neuropathy , NCS 12-2015  purely sensory, axonal  polyneuropathy  HTN Hyperlipidemia Anxiety-- rarely uses diazepam CV:Dr. Brackbill --CAD  --H/o Stroke 2013:non hemorrhagic infarcts scattered throughout the [place] . 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 Well-controlled. Creatinine clearance calculated 57. High cholesterol: On Lipitor. Controlled. Neuropathy: Currently on gabapentin 600 mg 3 times a day (and some additional small gabapentin tablets). Neuropathy symptoms are improved however she has developed a left thoracic pain. See next. Left thoracic pain: In a dermatome distribution, related to previous vertebral fracture?Marland Kitchen  Will increase gabapentin to 600 mg 4 times daily. Add a lidocaine patch. If not improving --- rec to d/w Dr Jordan Likes. Get a chest x-ray Gait disorder, fall prevention: Encouraged the use of a cane and later. Risk of fractures discussed Flu shot and tetanus shot today   RTC 3 months.   Today, I spent more than 30   min with the patient: >50% of the time counseling regards for prevention, benefits of gabapentin for the treatment of neuropathy but also for the treatment of left thoracic pain. Reviewing the chart.

## 2016-02-11 NOTE — Patient Instructions (Signed)
   GO TO THE FRONT DESK Schedule your next appointment for a  checkup in 3 months   STOP BY THE FIRST FLOOR:  get the XR   Increase gabapentin 600 mg to 1 tablet 4 times a day  Use one or 2 lidocaine patches at the area of pain as needed

## 2016-02-11 NOTE — Telephone Encounter (Signed)
PA initiated via Covermymeds; KEY: [id number]. Awaiting determination.

## 2016-02-12 DIAGNOSIS — G629 Polyneuropathy, unspecified: Secondary | ICD-10-CM | POA: Insufficient documentation

## 2016-02-12 NOTE — Telephone Encounter (Signed)
Patient called to follow up on the PA for Lidocaine. States that she received a call (recording) stating that it had been denied. Wants to know what Rx can be sent in that will not be as expensive.

## 2016-02-12 NOTE — Assessment & Plan Note (Signed)
HTN: Continue amlodipine 5 mg and metoprolol. Well-controlled. Creatinine clearance calculated 57. High cholesterol: On Lipitor. Controlled. Neuropathy: Currently on gabapentin 600 mg 3 times a day (and some additional small gabapentin tablets). Neuropathy symptoms are improved however she has developed a left thoracic pain. See next. Left thoracic pain: In a dermatome distribution, related to previous vertebral fracture?. Will increase gabapentin to 600 mg 4 times daily. Add a lidocaine patch. If not improving --- rec to d/w Dr Jordan LikesSpivey. Get a chest x-ray Gait disorder, fall prevention: Encouraged the use of a cane and later. Risk of fractures discussed Flu shot and tetanus shot today   RTC 3 months.

## 2016-02-12 NOTE — Telephone Encounter (Signed)
Awaiting PA denial information.

## 2016-02-13 ENCOUNTER — Other Ambulatory Visit: Payer: Self-pay | Admitting: Internal Medicine

## 2016-02-18 ENCOUNTER — Telehealth: Payer: Self-pay

## 2016-02-18 NOTE — Telephone Encounter (Signed)
Received ROI from Dr. Jordan LikesSpivey at [facility]. Requesting x-ray of thoracic spine. X-ray from 06/03/2015. Faxed to [phone]. ROI sent for scanning.

## 2016-02-19 ENCOUNTER — Encounter: Payer: Self-pay | Admitting: Vascular Surgery

## 2016-02-20 ENCOUNTER — Ambulatory Visit: Payer: Medicare Other | Admitting: Neurology

## 2016-02-24 ENCOUNTER — Encounter: Payer: Self-pay | Admitting: Vascular Surgery

## 2016-02-24 ENCOUNTER — Other Ambulatory Visit: Payer: Self-pay | Admitting: *Deleted

## 2016-02-24 ENCOUNTER — Ambulatory Visit (INDEPENDENT_AMBULATORY_CARE_PROVIDER_SITE_OTHER): Payer: Medicare Other | Admitting: Vascular Surgery

## 2016-02-24 VITALS — BP 100/60 | HR 60 | Temp 97.3°F | Resp 18 | Ht 63.5 in | Wt 153.2 lb

## 2016-02-24 DIAGNOSIS — I739 Peripheral vascular disease, unspecified: Secondary | ICD-10-CM | POA: Diagnosis not present

## 2016-02-24 DIAGNOSIS — I6523 Occlusion and stenosis of bilateral carotid arteries: Secondary | ICD-10-CM

## 2016-02-24 NOTE — Progress Notes (Signed)
[facility] [place]  Patient name: Sara [name] MRN: [id number] DOB: 1940-08-29 Sex: female  REASON FOR CONSULT: From a discomfort with peripheral vascular occlusive disease.  HPI: Sara [name] is a 75 y.o. female, who is seen today for evaluation of lower extremity discomfort. She is a very pleasant 75 year old known to me from a prior left carotid endarterectomy in 2013. She has had failing health with her shortly pain and difficulty in walking. She currently walks with a walker. She has been told she has neuropathy and is also has significant degenerative disc disease with prior back surgery and also with currently with the nerve stimulator for pain management. Ports that the discomfort is continuous. It can occur with walking and at rest. She reports that lying and sitting are discomforting as well as standing and walking around her home. She is not able to walk any distance due to lower extremity pain. She has had no tissue loss.  Past Medical History:  Diagnosis Date  . Anxiety   . Arthritis   . Bronchitis    hx of  . Carotid artery occlusion   . Chronic pain syndrome    pain management  . Coronary artery disease    sees Dr. Patty Sermons  . GERD (gastroesophageal reflux disease)    hx of  . H/O hiatal hernia   . Hypercholesteremia   . Hypertension    all managed by Dr. Haynes Dage bill  . Mitral valve regurgitation    trace  . Non-traumatic compression fracture of thoracic vertebra (HCC)    T9  . Stroke [facility])     Family History  Problem Relation Age of Onset  . Heart attack Father   . Heart disease Father     before age 38  . Heart disease Sister   . Cancer Sister   . Heart disease Sister     SOCIAL HISTORY: Social History   Social History  . Marital status: Married    Spouse name: N/A  . Number of children: 4  . Years of education: N/A   Occupational History  . retired     Social History Main Topics    . Smoking status: Former Smoker    Packs/day: 0.50    Years: 60.00    Types: Cigarettes  . Smokeless tobacco: Never Used     Comment: quit 04-2015  . Alcohol use No  . Drug use: No  . Sexual activity: Not on file   Other Topics Concern  . Not on file   Social History Narrative   Lives w/ husband in a one story home.     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   Education: high school    Allergies  Allergen Reactions  . Percocet [Oxycodone-Acetaminophen] Rash  . Hydrochlorothiazide     Pt does not remember  . Indomethacin     Pt does not remember  . Paroxetine Hcl     Pt does not remember  . Penicillins     Has patient had a PCN reaction causing immediate rash, facial/tongue/throat swelling, SOB or lightheadedness with hypotension: Yes Has patient had a PCN reaction causing severe rash involving mucus membranes or skin necrosis: No Has patient had a PCN reaction that required hospitalization No Has patient had a PCN reaction occurring within the last 10 years: No If all of the above answers are "NO", then may proceed with Cephalosporin use.   [place]  Pt does not remember  . Quinine Derivatives     Pt does not remember  . Wellbutrin [Bupropion]     agitation  . Zocor [Simvastatin - High Dose] Other (See Comments)    Weakness/no energy  . Morphine And Related Rash    Current Outpatient Prescriptions  Medication Sig Dispense Refill  . amLODipine (NORVASC) 10 MG tablet TAKE 1 TABLET BY MOUTH EVERY DAY (Patient taking differently: TAKE 1/2 TABLET BY MOUTH EVERY DAY) 30 tablet 6  . aspirin EC 81 MG tablet Take 81 mg by mouth daily.    Marland Kitchen. atorvastatin (LIPITOR) 40 MG tablet Take 0.5 tablets (20 mg total) by mouth daily. 15 tablet 1  . diazepam (VALIUM) 5 MG tablet Take 1 tablet (5 mg total) by mouth daily as needed. (Patient taking differently: Take 2.5 mg by mouth at bedtime as needed (sleep). ) 30 tablet 2  . gabapentin (NEURONTIN) 600 MG tablet Take 1  tablet (600 mg total) by mouth 4 (four) times daily. 120 tablet 6  . HYDROcodone-acetaminophen (NORCO) 10-325 MG tablet Take 1 tablet by mouth every 6 (six) hours as needed for moderate pain (for pain). 60 tablet 0  . metoprolol (LOPRESSOR) 50 MG tablet Take 0.5 tablets (25 mg total) by mouth 2 (two) times daily. 30 tablet 5  . omeprazole (PRILOSEC) 20 MG capsule Take 1 capsule (20 mg total) by mouth daily. 30 capsule 5  . vitamin B-12 (CYANOCOBALAMIN) 1000 MCG tablet Take 1,000 mcg by mouth daily.    Marland Kitchen. lidocaine (LIDODERM) 5 % Place 2 patches onto the skin daily. Remove & Discard patch within 12 hours or as directed by MD (Patient not taking: Reported on 02/24/2016) 30 patch 6   No current facility-administered medications for this visit.     REVIEW OF SYSTEMS:  [X]  denotes positive finding, [ ]  denotes negative finding Cardiac  Comments:  Chest pain or chest pressure: x   Shortness of breath upon exertion: x   Short of breath when lying flat:    Irregular heart rhythm: x       Vascular    Pain in calf, thigh, or hip brought on by ambulation: x   Pain in feet at night that wakes you up from your sleep:  x   Blood clot in your veins:    Leg swelling:         Pulmonary    Oxygen at home:    Productive cough:     Wheezing:         Neurologic    Sudden weakness in arms or legs:     Sudden numbness in arms or legs:     Sudden onset of difficulty speaking or slurred speech:    Temporary loss of vision in one eye:     Problems with dizziness:         Gastrointestinal    Blood in stool:     Vomited blood:         Genitourinary    Burning when urinating:     Blood in urine:        Psychiatric    Major depression:         Hematologic    Bleeding problems:    Problems with blood clotting too easily:        Skin    Rashes or ulcers:        Constitutional    Fever or chills:      PHYSICAL EXAM: Vitals:  02/24/16 1019  BP: 100/60  Pulse: 60  Resp: 18  Temp: 97.3 F  (36.3 C)  TempSrc: Oral  SpO2: 97%  Weight: 153 lb 3.2 oz (69.5 kg)  Height: 5' 3.5" (1.613 m)    GENERAL: The patient is a well-nourished female, in no acute distress. The vital signs are documented above. CARDIOVASCULAR: 2+ radial pulses. She does have diminished femoral pulses bilaterally. She has palpable right and left popliteal pulses. Do not palpate pedal pulses. PULMONARY: There is good air exchange  ABDOMEN: Soft and non-tender  MUSCULOSKELETAL: There are no major deformities or cyanosis. NEUROLOGIC: No focal weakness or paresthesias are detected. SKIN: There are no ulcers or rashes noted. PSYCHIATRIC: The patient has a normal affect.  DATA:  Noninvasive studies from heart care ROM 12/30/2015 was reviewed and discussed with patient. This shows normal ankle arm index on the right at 0.97 on the left mildly diminished at 0.78. Imaging reveals iliac artery occlusive disease.  He did have a CT scan for why evaluation of abdominal pain several months ago and I reviewed this as well. This does show some ectasia of her aorta without true aneurysmal formation and severe irregularity of her aorta and iliac system. There does appear to be high-grade stenosis at the origin of her left common iliac artery.  MEDICAL ISSUES: Had long discussion with patient regarding the significance of these studies. She does have mild arterial insufficiency. She has multiple other causes of lower extremity discomfort which are limiting her walking ability. She is not able to walk to the point of claudication. Explained that this level of arterial insufficiency does not explain her discomfort. Feel that this is more related to her severe degenerative disc disease and neuropathy. Would not recommend any further evaluation since she is not having any symptoms related to her mild to moderate arterial insufficiency. She was comfortable with this discussion will see us again on an as-needed basis   Larina Earthlyodd F. Early, MD  [facility] Tel [phone] Pager [phone]

## 2016-02-25 ENCOUNTER — Ambulatory Visit (HOSPITAL_COMMUNITY)
Admission: RE | Admit: 2016-02-25 | Discharge: 2016-02-25 | Disposition: A | Discharge status: Home / Self Care | Payer: Medicare Other | Source: Ambulatory Visit | Attending: Vascular Surgery | Admitting: Vascular Surgery

## 2016-02-25 DIAGNOSIS — I6523 Occlusion and stenosis of bilateral carotid arteries: Secondary | ICD-10-CM | POA: Diagnosis not present

## 2016-02-25 LAB — VAS US CAROTID
LCCADDIAS: -10 cm/s
LCCADSYS: -38 cm/s
LCCAPDIAS: 12 cm/s
LCCAPSYS: 67 cm/s
LEFT ECA DIAS: -10 cm/s
LEFT VERTEBRAL DIAS: 8 cm/s
LICADDIAS: -27 cm/s
Left ICA dist sys: -91 cm/s
Left ICA prox dias: 11 cm/s
Left ICA prox sys: 35 cm/s
RCCADSYS: -77 cm/s
RCCAPSYS: 68 cm/s
RIGHT CCA MID DIAS: 14 cm/s
RIGHT ECA DIAS: 19 cm/s
RIGHT VERTEBRAL DIAS: 18 cm/s
Right CCA prox dias: 13 cm/s

## 2016-03-11 ENCOUNTER — Ambulatory Visit
Admission: RE | Admit: 2016-03-11 | Discharge: 2016-03-11 | Disposition: A | Discharge status: Home / Self Care | Payer: Medicare Other | Source: Ambulatory Visit | Attending: Physician Assistant | Admitting: Physician Assistant

## 2016-03-11 ENCOUNTER — Other Ambulatory Visit: Payer: Self-pay | Admitting: Physician Assistant

## 2016-03-11 DIAGNOSIS — M546 Pain in thoracic spine: Secondary | ICD-10-CM

## 2016-03-29 ENCOUNTER — Other Ambulatory Visit: Payer: Self-pay | Admitting: Internal Medicine

## 2016-03-29 NOTE — Telephone Encounter (Signed)
Rx faxed to [facility] pharmacy.  

## 2016-03-29 NOTE — Telephone Encounter (Signed)
Ok 30, 2 RF 

## 2016-03-29 NOTE — Telephone Encounter (Signed)
Rx printed, awaiting MD signature.  

## 2016-03-29 NOTE — Telephone Encounter (Signed)
Pt is requesting refill on Diazepam.  Last OV: 02/11/2016 Last Fill: 07/31/2015 #30 and 2RF UDS: None  Please advise.

## 2016-03-30 ENCOUNTER — Inpatient Hospital Stay (HOSPITAL_COMMUNITY)
Admission: EM | Admit: 2016-03-30 | Discharge: 2016-04-02 | DRG: 482 | Disposition: A | Discharge status: Home Health | Payer: Medicare Other | Attending: Internal Medicine | Admitting: Internal Medicine

## 2016-03-30 ENCOUNTER — Encounter (HOSPITAL_COMMUNITY): Payer: Self-pay | Admitting: Emergency Medicine

## 2016-03-30 ENCOUNTER — Emergency Department (HOSPITAL_COMMUNITY): Payer: Medicare Other

## 2016-03-30 DIAGNOSIS — Z8673 Personal history of transient ischemic attack (TIA), and cerebral infarction without residual deficits: Secondary | ICD-10-CM

## 2016-03-30 DIAGNOSIS — I340 Nonrheumatic mitral (valve) insufficiency: Secondary | ICD-10-CM | POA: Diagnosis present

## 2016-03-30 DIAGNOSIS — S72001A Fracture of unspecified part of neck of right femur, initial encounter for closed fracture: Principal | ICD-10-CM

## 2016-03-30 DIAGNOSIS — Z981 Arthrodesis status: Secondary | ICD-10-CM

## 2016-03-30 DIAGNOSIS — Y92002 Bathroom of unspecified non-institutional (private) residence single-family (private) house as the place of occurrence of the external cause: Secondary | ICD-10-CM

## 2016-03-30 DIAGNOSIS — I714 Abdominal aortic aneurysm, without rupture, unspecified: Secondary | ICD-10-CM | POA: Diagnosis present

## 2016-03-30 DIAGNOSIS — D72829 Elevated white blood cell count, unspecified: Secondary | ICD-10-CM | POA: Diagnosis present

## 2016-03-30 DIAGNOSIS — F419 Anxiety disorder, unspecified: Secondary | ICD-10-CM | POA: Diagnosis present

## 2016-03-30 DIAGNOSIS — S72009A Fracture of unspecified part of neck of unspecified femur, initial encounter for closed fracture: Secondary | ICD-10-CM

## 2016-03-30 DIAGNOSIS — Z79899 Other long term (current) drug therapy: Secondary | ICD-10-CM

## 2016-03-30 DIAGNOSIS — Z7982 Long term (current) use of aspirin: Secondary | ICD-10-CM

## 2016-03-30 DIAGNOSIS — K219 Gastro-esophageal reflux disease without esophagitis: Secondary | ICD-10-CM | POA: Diagnosis present

## 2016-03-30 DIAGNOSIS — M25551 Pain in right hip: Secondary | ICD-10-CM

## 2016-03-30 DIAGNOSIS — I2510 Atherosclerotic heart disease of native coronary artery without angina pectoris: Secondary | ICD-10-CM | POA: Diagnosis present

## 2016-03-30 DIAGNOSIS — I712 Thoracic aortic aneurysm, without rupture: Secondary | ICD-10-CM | POA: Diagnosis present

## 2016-03-30 DIAGNOSIS — N289 Disorder of kidney and ureter, unspecified: Secondary | ICD-10-CM

## 2016-03-30 DIAGNOSIS — I10 Essential (primary) hypertension: Secondary | ICD-10-CM | POA: Diagnosis present

## 2016-03-30 DIAGNOSIS — Z87891 Personal history of nicotine dependence: Secondary | ICD-10-CM

## 2016-03-30 DIAGNOSIS — G894 Chronic pain syndrome: Secondary | ICD-10-CM | POA: Diagnosis present

## 2016-03-30 DIAGNOSIS — W182XXA Fall in (into) shower or empty bathtub, initial encounter: Secondary | ICD-10-CM | POA: Diagnosis present

## 2016-03-30 DIAGNOSIS — E7800 Pure hypercholesterolemia, unspecified: Secondary | ICD-10-CM | POA: Diagnosis present

## 2016-03-30 DIAGNOSIS — M810 Age-related osteoporosis without current pathological fracture: Secondary | ICD-10-CM | POA: Diagnosis present

## 2016-03-30 NOTE — ED Notes (Signed)
Bed: ZO10WA16 Expected date:  Expected time:  Means of arrival:  Comments: 75 yo F  Fall, right hip pain

## 2016-03-30 NOTE — ED Provider Notes (Signed)
[facility]-EMERGENCY DEPT Provider Note   CSN: [id number] Arrival date & time: 03/30/16  2223   By signing my name below, I, Sara [name], attest that this documentation has been prepared under the direction and in the presence of Sara Ziesmer, MD . Electronically Signed: Teofilo [name], ED Scribe. 03/31/2016. 12:38 AM.   History   Chief Complaint Chief Complaint  Patient presents with  . Fall    The history is provided by the patient. No language interpreter was used.  Fall  This is a new problem. The current episode started 3 to 5 hours ago. The problem occurs constantly. The problem has not changed since onset.Pertinent negatives include no chest pain. Nothing aggravates the symptoms. Nothing relieves the symptoms. Treatments tried: vicodin. The treatment provided no relief.   HPI Comments:  Sara [name] is a 75 y.o. female with PMHx of CAD and HTN who presents to the Emergency Department s/p fall at 1900 yesterday. Pt states that she had an unwitnessed fall when she was getting out of the tub. Pt reports that she slipped on the floor and her right hip hit the side of the tub. Pt notes pain on the right side of her groin with mild rib pain, and states that she has been unable to walk since the fall. Pt rates her pain at 5/10 at rest. Pt has not eaten since 1800 yesterday. Pt takes baby aspirin daily and tetanus is UTD. Pt notes a known allergy to percocet and morphine. No alleviating factors noted. Pt denies injury elsewhere. Pt denies other associated symptoms.   Past Medical History:  Diagnosis Date  . Anxiety   . Arthritis   . Bronchitis    hx of  . Carotid artery occlusion   . Chronic pain syndrome    pain management  . Coronary artery disease    sees Dr. Patty Sermons  . GERD (gastroesophageal reflux disease)    hx of  . H/O hiatal hernia   . Hypercholesteremia   . Hypertension    all managed by Dr. Haynes Dage bill  . Mitral valve regurgitation    trace  .  Non-traumatic compression fracture of thoracic vertebra (HCC)    T9  . Stroke [facility])     Patient Active Problem List   Diagnosis Date Noted  . Neuropathy (HCC) 02/12/2016  . AAA (abdominal aortic aneurysm) without rupture (HCC) 06/22/2015  . PCP NOTES >>>>>>>>>>>>>>>>>>>>>>>>>>>>>>>>>. 06/22/2015  . Malnutrition of moderate degree 06/05/2015  . Gait disorder 06/04/2015  . Elevated transaminase level 06/04/2015  . AKI (acute kidney injury) (HCC) 06/04/2015  . Pre-syncope 06/04/2015  . Hip fracture (HCC) 02/07/2015  . Closed left hip fracture (HCC) 02/07/2015  . Routine general medical examination at a health care facility 01/22/2013  . Caregiver stress 01/22/2013  . 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 Chronic back pain 08/29/2012  . Occlusion and stenosis of carotid artery without mention of cerebral infarction 05/09/2012  . Carotid stenosis, left 04/13/2012  . 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 CVA (cerebral infarction) 04/11/2012  . Arthritis 08/05/2010  . Tobacco abuse 08/05/2010  . Mitral valve regurgitation   . Hypertension   . Hypercholesteremia   . Coronary artery disease     Past Surgical History:  Procedure Laterality Date  . BACK SURGERY     x 3 Dr Juliene Pina, last @ [facility] ~ 2005, rods, fusion, four surgery  . CARDIAC CATHETERIZATION  1994  . CAROTID ENDARTERECTOMY    . CARPAL TUNNEL RELEASE  left  . CHOLECYSTECTOMY    . ENDARTERECTOMY  04/24/2012   Procedure: ENDARTERECTOMY CAROTID;  Surgeon: Larina Earthly, MD;  Location: [facility] OR;  Service: Vascular;  Laterality: Left;  . HAND SURGERY     bilateral  . HIP FRACTURE SURGERY Left   . HIP PINNING,CANNULATED Left 02/08/2015   Procedure: CANNULATED HIP PINNING;  Surgeon: Kathryne Hitch, MD;  Location: [facility] ORS;  Service: Orthopedics;  Laterality: Left;  . HYSTEROTOMY    . SPINAL CORD STIMULATOR INSERTION    . TEE WITHOUT CARDIOVERSION  04/12/2012   Procedure: TRANSESOPHAGEAL  ECHOCARDIOGRAM (TEE);  Surgeon: Lewayne Bunting, MD;  Location: [facility] ENDOSCOPY;  Service: Cardiovascular;  Laterality: N/A;    OB History    No data available       Home Medications    Prior to Admission medications   Medication Sig Start Date End Date Taking? Authorizing Provider  aspirin EC 81 MG tablet Take 81 mg by mouth daily.   Yes Historical Provider, MD  atorvastatin (LIPITOR) 40 MG tablet Take 0.5 tablets (20 mg total) by mouth daily. 01/26/16  Yes Wanda Plump, MD  diazepam (VALIUM) 5 MG tablet Take 1 tablet (5 mg total) by mouth daily as needed. 03/29/16  Yes Wanda Plump, MD  gabapentin (NEURONTIN) 600 MG tablet Take 1 tablet (600 mg total) by mouth 4 (four) times daily. 02/11/16  Yes Wanda Plump, MD  HYDROcodone-acetaminophen (NORCO) 10-325 MG tablet Take 1 tablet by mouth every 6 (six) hours as needed for moderate pain (for pain). 02/09/15  Yes Kathryne Hitch, MD  metoprolol (LOPRESSOR) 50 MG tablet Take 0.5 tablets (25 mg total) by mouth 2 (two) times daily. 10/07/15  Yes Wanda Plump, MD  omeprazole (PRILOSEC) 20 MG capsule Take 1 capsule (20 mg total) by mouth daily. 10/06/15  Yes Wanda Plump, MD  vitamin B-12 (CYANOCOBALAMIN) 1000 MCG tablet Take 1,000 mcg by mouth daily.   Yes Historical Provider, MD  amLODipine (NORVASC) 10 MG tablet TAKE 1 TABLET BY MOUTH EVERY DAY Patient taking differently: TAKE 1/2 TABLET BY MOUTH EVERY DAY 07/01/15   Jake Bathe, MD  lidocaine (LIDODERM) 5 % Place 2 patches onto the skin daily. Remove & Discard patch within 12 hours or as directed by MD Patient not taking: Reported on 02/24/2016 02/11/16   Wanda Plump, MD    Family History Family History  Problem Relation Age of Onset  . Heart attack Father   . Heart disease Father     before age 5  . Heart disease Sister   . Cancer Sister   . Heart disease Sister     Social History Social History  Substance Use Topics  . Smoking status: Former Smoker    Packs/day: 0.50    Years: 60.00      Types: Cigarettes  . Smokeless tobacco: Never Used     Comment: quit 04-2015  . Alcohol use No     Allergies   Percocet [oxycodone-acetaminophen]; Hydrochlorothiazide; Indomethacin; Paroxetine hcl; Penicillins; Pravachol; Quinine derivatives; Wellbutrin [bupropion]; Zocor [simvastatin - high dose]; and Morphine and related   Review of Systems Review of Systems  Cardiovascular: Negative for chest pain.  Musculoskeletal: Positive for arthralgias.  Neurological: Negative for syncope.  All other systems reviewed and are negative.    Physical Exam Updated Vital Signs BP 139/70 (BP Location: Left Arm)   Pulse 65   Temp 98.2 F (36.8 C) (Oral)   Resp 18   Ht  5\' 4"  (1.626 m)   Wt 140 lb (63.5 kg)   SpO2 90%   BMI 24.03 kg/m   Physical Exam  Constitutional: She is oriented to person, place, and time. She appears well-developed and well-nourished. No distress.  HENT:  Head: Normocephalic and atraumatic.  Mouth/Throat: Oropharynx is clear and moist. No oropharyngeal exudate.  Trachea midline  Eyes: Conjunctivae and EOM are normal. Pupils are equal, round, and reactive to light.  Neck: Trachea normal and normal range of motion. Neck supple. No JVD present. Carotid bruit is not present.  No bruits  Cardiovascular: Normal rate, regular rhythm, normal heart sounds and intact distal pulses.  Exam reveals no gallop and no friction rub.   No murmur heard. Pulmonary/Chest: Effort normal and breath sounds normal. No stridor. She has no wheezes. She has no rales.  Lungs clear  Abdominal: Soft. Bowel sounds are normal. She exhibits no mass. There is no tenderness. There is no rebound and no guarding.  Musculoskeletal: She exhibits no deformity.  Negative anterior and posterior drawer test bilaterally.   Lymphadenopathy:    She has no cervical adenopathy.  Neurological: She is alert and oriented to person, place, and time. She has normal reflexes. She displays normal reflexes. No  cranial nerve deficit. She exhibits normal muscle tone. Coordination normal.  Cranial nerves 2-12 intact  Skin: Skin is warm and dry. Capillary refill takes less than 2 seconds. She is not diaphoretic.  Psychiatric: She has a normal mood and affect. Her behavior is normal.     ED Treatments / Results   Vitals:   03/31/16 0008 03/31/16 0224  BP: 139/70 116/97  Pulse: 65 73  Resp: 18 18  Temp: 98.2 F (36.8 C)     DIAGNOSTIC STUDIES: Results for orders placed or performed during the hospital encounter of 03/30/16  CBC with Differential/Platelet  Result Value Ref Range   WBC 10.9 (H) 4.0 - 10.5 K/uL   RBC 3.68 (L) 3.87 - 5.11 MIL/uL   Hemoglobin 11.3 (L) 12.0 - 15.0 g/dL   HCT 16.134.5 (L) 09.636.0 - 04.546.0 %   MCV 93.8 78.0 - 100.0 fL   MCH 30.7 26.0 - 34.0 pg   MCHC 32.8 30.0 - 36.0 g/dL   RDW 40.913.3 81.111.5 - 91.415.5 %   Platelets 240 150 - 400 K/uL   Neutrophils Relative % 64 %   Neutro Abs 6.9 1.7 - 7.7 K/uL   Lymphocytes Relative 27 %   Lymphs Abs 3.0 0.7 - 4.0 K/uL   Monocytes Relative 7 %   Monocytes Absolute 0.8 0.1 - 1.0 K/uL   Eosinophils Relative 2 %   Eosinophils Absolute 0.3 0.0 - 0.7 K/uL   Basophils Relative 0 %   Basophils Absolute 0.0 0.0 - 0.1 K/uL  I-Stat Chem 8, ED  Result Value Ref Range   Sodium 142 135 - 145 mmol/L   Potassium 3.7 3.5 - 5.1 mmol/L   Chloride 105 101 - 111 mmol/L   BUN 22 (H) 6 - 20 mg/dL   Creatinine, Ser 7.821.10 (H) 0.44 - 1.00 mg/dL   Glucose, Bld 99 65 - 99 mg/dL   Calcium, Ion 9.561.21 2.131.15 - 1.40 mmol/L   TCO2 26 0 - 100 mmol/L   Hemoglobin 11.6 (L) 12.0 - 15.0 g/dL   HCT 08.634.0 (L) 57.836.0 - 46.946.0 %   Dg Chest 2 View  Result Date: 03/31/2016 CLINICAL DATA:  Fall getting out of the bath tub this evening. Right hip pain. EXAM: CHEST  2  VIEW COMPARISON:  Radiographs 02/11/2016 FINDINGS: Lung volumes are low. Unchanged heart size and mediastinal contours. There is atherosclerosis of the thoracic aorta. No pulmonary edema, pleural fluid, pneumothorax or  focal airspace disease. Spinal stimulator in place. Kyphoplasty within midthoracic vertebra. The bones appear under mineralized. No acute osseous abnormality is seen. IMPRESSION: Low lung volumes without evidence of acute abnormality. Electronically Signed   By: Rubye Oaks M.D.   On: 03/31/2016 00:55   Dg Thoracic Spine W/swimmers  Result Date: 03/12/2016 CLINICAL DATA:  Thoracic spine pain / chronic / worsened x 1-3 months / no trauma / pt. states spinal stenosis EXAM: THORACIC SPINE - 3 VIEWS COMPARISON:  Chest radiograph, 02/11/2016 FINDINGS: Moderate compression fracture of T8 has been treated with vertebroplasty. This is stable. No other fractures.  No spondylolisthesis. Bones are diffusely demineralized. No osteoblastic or osteolytic lesions. There are minor degenerative changes along the mid thoracic spine and lower thoracic spine reflected by loss of disc height and small endplate osteophytes. Spine stimulator leads project posterior to the T7 through T9 vertebra, within the posterior spinal canal, stable. Soft tissues are unremarkable. IMPRESSION: 1. No acute findings. 2. Treated compression fracture of T8, stable from the prior study. No other fractures. Electronically Signed   By: Amie Portland M.D.   On: 03/12/2016 08:36   Ct Head Wo Contrast  Result Date: 03/31/2016 CLINICAL DATA:  Slipped and fell getting out of bathtub at 1900 hours. RIGHT hip pain. No head injury or loss of consciousness. History of stroke, hypertension and hyperlipidemia. EXAM: CT HEAD WITHOUT CONTRAST CT CERVICAL SPINE WITHOUT CONTRAST TECHNIQUE: Multidetector CT imaging of the head and cervical spine was performed following the standard protocol without intravenous contrast. Multiplanar CT image reconstructions of the cervical spine were also generated. COMPARISON:  CT HEAD June 04, 2015 FINDINGS: CT HEAD FINDINGS BRAIN: The ventricles and sulci are normal for age. No intraparenchymal hemorrhage, mass effect nor  midline shift. Patchy supratentorial white matter hypodensities less than expected for patient's age, though non-specific are most compatible with chronic small vessel ischemic disease. Small area LEFT frontal encephalomalacia. No acute large vascular territory infarcts. No abnormal extra-axial fluid collections. Basal cisterns are patent. VASCULAR: Moderate calcific atherosclerosis of the carotid siphons. SKULL: No skull fracture. 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 No significant scalp soft tissue swelling. SINUSES/ORBITS: The mastoid air-cells and included paranasal sinuses are well-aerated.The included ocular globes and orbital contents are non-suspicious. OTHER: None. CT CERVICAL SPINE FINDINGS ALIGNMENT: Maintained lordosis. Vertebral bodies in alignment. SKULL BASE AND VERTEBRAE: Cervical vertebral bodies and posterior elements are intact. Intervertebral disc heights preserved. No destructive bony lesions. Severe LEFT facet arthropathy. C1-2 articulation maintained. SOFT TISSUES AND SPINAL CANAL: Nonacute. Moderate calcific atherosclerosis RIGHT carotid bulb. Asymmetrically enlarged LEFT Common carotid artery. Aneurysmal thoracic aorta at at least 4 cm, incompletely imaged. Surgical clips LEFT neck. DISC LEVELS: No significant osseous canal stenosis. Moderate LEFT C3-4 neural foraminal narrowing. UPPER CHEST: Apical calcified pleural plaques. OTHER: None. IMPRESSION: CT HEAD: No acute intracranial process. Old small LEFT frontal lobe infarct, otherwise negative CT HEAD for age. CT CERVICAL SPINE: No acute fracture or malalignment. At least 4 cm aneurysmal thoracic aorta. Recommend CTA chest on a NONEMERGENT basis for further characterization. Electronically Signed   By: Awilda Metro M.D.   On: 03/31/2016 01:14   Ct Cervical Spine Wo Contrast  Result Date: 03/31/2016 CLINICAL DATA:  Slipped and fell getting out of bathtub at 1900 hours. RIGHT hip pain. No head injury or loss of  consciousness. History of stroke, hypertension and hyperlipidemia. EXAM: CT HEAD WITHOUT CONTRAST CT CERVICAL SPINE WITHOUT CONTRAST TECHNIQUE: Multidetector CT imaging of the head and cervical spine was performed following the standard protocol without intravenous contrast. Multiplanar CT image reconstructions of the cervical spine were also generated. COMPARISON:  CT HEAD June 04, 2015 FINDINGS: CT HEAD FINDINGS BRAIN: The ventricles and sulci are normal for age. No intraparenchymal hemorrhage, mass effect nor midline shift. Patchy supratentorial white matter hypodensities less than expected for patient's age, though non-specific are most compatible with chronic small vessel ischemic disease. Small area LEFT frontal encephalomalacia. No acute large vascular territory infarcts. No abnormal extra-axial fluid collections. Basal cisterns are patent. VASCULAR: Moderate calcific atherosclerosis of the carotid siphons. SKULL: No skull fracture. 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 No significant scalp soft tissue swelling. SINUSES/ORBITS: The mastoid air-cells and included paranasal sinuses are well-aerated.The included ocular globes and orbital contents are non-suspicious. OTHER: None. CT CERVICAL SPINE FINDINGS ALIGNMENT: Maintained lordosis. Vertebral bodies in alignment. SKULL BASE AND VERTEBRAE: Cervical vertebral bodies and posterior elements are intact. Intervertebral disc heights preserved. No destructive bony lesions. Severe LEFT facet arthropathy. C1-2 articulation maintained. SOFT TISSUES AND SPINAL CANAL: Nonacute. Moderate calcific atherosclerosis RIGHT carotid bulb. Asymmetrically enlarged LEFT Common carotid artery. Aneurysmal thoracic aorta at at least 4 cm, incompletely imaged. Surgical clips LEFT neck. DISC LEVELS: No significant osseous canal stenosis. Moderate LEFT C3-4 neural foraminal narrowing. UPPER CHEST: Apical calcified pleural plaques. OTHER: None. IMPRESSION: CT HEAD: No  acute intracranial process. Old small LEFT frontal lobe infarct, otherwise negative CT HEAD for age. CT CERVICAL SPINE: No acute fracture or malalignment. At least 4 cm aneurysmal thoracic aorta. Recommend CTA chest on a NONEMERGENT basis for further characterization. Electronically Signed   By: Awilda Metroourtnay  Bloomer M.D.   On: 03/31/2016 01:14   Dg Hip Unilat W Or Wo Pelvis 2-3 Views Right  Result Date: 03/31/2016 CLINICAL DATA:  Larey SeatFell getting out of bathtub this evening. RIGHT hip pain. EXAM: DG HIP (WITH OR WITHOUT PELVIS) 2-3V RIGHT COMPARISON:  None. FINDINGS: Slight cortical irregularity of the femoral neck with faint sclerosis. No dislocation. Status post LEFT femoral neck pinning with intact hardware. PLIF, spinal stimulator battery pack projects in RIGHT abdomen. There is no evidence of arthropathy or other focal bone abnormality. IMPRESSION: Suspected acute nondisplaced RIGHT femoral neck fracture without dislocation. Electronically Signed   By: Awilda Metroourtnay  Bloomer M.D.   On: 03/31/2016 00:56    COORDINATION OF CARE:  12:38 AM Discussed treatment plan with pt at bedside and pt agreed to plan.    Procedures Procedures (including critical care time)  Medications Ordered in ED  Medications  fentaNYL (SUBLIMAZE) injection 50 mcg (50 mcg Intravenous Given 03/31/16 0345)   Case d/w Dr. Otelia SergeantNitka, [facility].  Admit to medicine please  Final Clinical Impressions(s) / ED Diagnoses   Hip fracture: admit to medicine   Edgar Reisz, MD 03/31/16 [id number]

## 2016-03-30 NOTE — ED Notes (Signed)
Pt brought to ED via EMS from home for fall occurring at 1900 this evening.  PT reports slipping as she was attempting to step out of the tub and falling on her R hip.  Pt denies LOC or hitting head as well as dizziness/weakness.  Denies neck or back pain outside of her normal chronic back pain.  No deformity, external rotation, or shortening of R leg noted.  Hx left hip fracture last year.  Denies recent falls.  No unilateral weakness or facial droop noted. VS per EMS: 180/90, 90 bpm, 97%, A&Ox4

## 2016-03-31 ENCOUNTER — Inpatient Hospital Stay (HOSPITAL_COMMUNITY): Payer: Medicare Other | Admitting: Anesthesiology

## 2016-03-31 ENCOUNTER — Inpatient Hospital Stay (HOSPITAL_COMMUNITY): Payer: Medicare Other

## 2016-03-31 ENCOUNTER — Emergency Department (HOSPITAL_COMMUNITY): Payer: Medicare Other

## 2016-03-31 ENCOUNTER — Encounter (HOSPITAL_COMMUNITY)
Admission: EM | Disposition: A | Discharge status: Home Health | Payer: Self-pay | Source: Home / Self Care | Attending: Internal Medicine

## 2016-03-31 DIAGNOSIS — Z8673 Personal history of transient ischemic attack (TIA), and cerebral infarction without residual deficits: Secondary | ICD-10-CM | POA: Diagnosis not present

## 2016-03-31 DIAGNOSIS — Z7982 Long term (current) use of aspirin: Secondary | ICD-10-CM | POA: Diagnosis not present

## 2016-03-31 DIAGNOSIS — D72829 Elevated white blood cell count, unspecified: Secondary | ICD-10-CM | POA: Diagnosis present

## 2016-03-31 DIAGNOSIS — K219 Gastro-esophageal reflux disease without esophagitis: Secondary | ICD-10-CM | POA: Diagnosis present

## 2016-03-31 DIAGNOSIS — Z981 Arthrodesis status: Secondary | ICD-10-CM | POA: Diagnosis not present

## 2016-03-31 DIAGNOSIS — S72001S Fracture of unspecified part of neck of right femur, sequela: Secondary | ICD-10-CM

## 2016-03-31 DIAGNOSIS — I714 Abdominal aortic aneurysm, without rupture: Secondary | ICD-10-CM

## 2016-03-31 DIAGNOSIS — W182XXA Fall in (into) shower or empty bathtub, initial encounter: Secondary | ICD-10-CM | POA: Diagnosis present

## 2016-03-31 DIAGNOSIS — N289 Disorder of kidney and ureter, unspecified: Secondary | ICD-10-CM | POA: Diagnosis present

## 2016-03-31 DIAGNOSIS — S72001A Fracture of unspecified part of neck of right femur, initial encounter for closed fracture: Principal | ICD-10-CM

## 2016-03-31 DIAGNOSIS — Z87891 Personal history of nicotine dependence: Secondary | ICD-10-CM | POA: Diagnosis not present

## 2016-03-31 DIAGNOSIS — Y92002 Bathroom of unspecified non-institutional (private) residence single-family (private) house as the place of occurrence of the external cause: Secondary | ICD-10-CM | POA: Diagnosis not present

## 2016-03-31 DIAGNOSIS — M25551 Pain in right hip: Secondary | ICD-10-CM | POA: Diagnosis present

## 2016-03-31 DIAGNOSIS — F419 Anxiety disorder, unspecified: Secondary | ICD-10-CM | POA: Diagnosis present

## 2016-03-31 DIAGNOSIS — I2510 Atherosclerotic heart disease of native coronary artery without angina pectoris: Secondary | ICD-10-CM | POA: Diagnosis present

## 2016-03-31 DIAGNOSIS — G894 Chronic pain syndrome: Secondary | ICD-10-CM | POA: Diagnosis present

## 2016-03-31 DIAGNOSIS — I340 Nonrheumatic mitral (valve) insufficiency: Secondary | ICD-10-CM | POA: Diagnosis present

## 2016-03-31 DIAGNOSIS — E7800 Pure hypercholesterolemia, unspecified: Secondary | ICD-10-CM | POA: Diagnosis present

## 2016-03-31 DIAGNOSIS — M810 Age-related osteoporosis without current pathological fracture: Secondary | ICD-10-CM | POA: Diagnosis present

## 2016-03-31 DIAGNOSIS — I712 Thoracic aortic aneurysm, without rupture: Secondary | ICD-10-CM | POA: Diagnosis present

## 2016-03-31 DIAGNOSIS — I10 Essential (primary) hypertension: Secondary | ICD-10-CM | POA: Diagnosis present

## 2016-03-31 DIAGNOSIS — Z79899 Other long term (current) drug therapy: Secondary | ICD-10-CM | POA: Diagnosis not present

## 2016-03-31 HISTORY — PX: HIP PINNING,CANNULATED: SHX1758

## 2016-03-31 LAB — I-STAT CHEM 8, ED
BUN: 22 mg/dL — ABNORMAL HIGH (ref 6–20)
CALCIUM ION: 1.21 mmol/L (ref 1.15–1.40)
CHLORIDE: 105 mmol/L (ref 101–111)
Creatinine, Ser: 1.1 mg/dL — ABNORMAL HIGH (ref 0.44–1.00)
GLUCOSE: 99 mg/dL (ref 65–99)
HCT: 34 % — ABNORMAL LOW (ref 36.0–46.0)
Hemoglobin: 11.6 g/dL — ABNORMAL LOW (ref 12.0–15.0)
Potassium: 3.7 mmol/L (ref 3.5–5.1)
Sodium: 142 mmol/L (ref 135–145)
TCO2: 26 mmol/L (ref 0–100)

## 2016-03-31 LAB — CBC WITH DIFFERENTIAL/PLATELET
Basophils Absolute: 0 10*3/uL (ref 0.0–0.1)
Basophils Relative: 0 %
EOS PCT: 2 %
Eosinophils Absolute: 0.3 10*3/uL (ref 0.0–0.7)
HCT: 34.5 % — ABNORMAL LOW (ref 36.0–46.0)
Hemoglobin: 11.3 g/dL — ABNORMAL LOW (ref 12.0–15.0)
LYMPHS ABS: 3 10*3/uL (ref 0.7–4.0)
LYMPHS PCT: 27 %
MCH: 30.7 pg (ref 26.0–34.0)
MCHC: 32.8 g/dL (ref 30.0–36.0)
MCV: 93.8 fL (ref 78.0–100.0)
MONO ABS: 0.8 10*3/uL (ref 0.1–1.0)
Monocytes Relative: 7 %
Neutro Abs: 6.9 10*3/uL (ref 1.7–7.7)
Neutrophils Relative %: 64 %
PLATELETS: 240 10*3/uL (ref 150–400)
RBC: 3.68 MIL/uL — AB (ref 3.87–5.11)
RDW: 13.3 % (ref 11.5–15.5)
WBC: 10.9 10*3/uL — ABNORMAL HIGH (ref 4.0–10.5)

## 2016-03-31 LAB — MRSA PCR SCREENING: MRSA BY PCR: NEGATIVE

## 2016-03-31 LAB — TYPE AND SCREEN
ABO/RH(D): A POS
ANTIBODY SCREEN: NEGATIVE

## 2016-03-31 LAB — ABO/RH: ABO/RH(D): A POS

## 2016-03-31 SURGERY — FIXATION, FEMUR, NECK, PERCUTANEOUS, USING SCREW
Anesthesia: General | Site: Hip | Laterality: Right

## 2016-03-31 MED ORDER — MORPHINE SULFATE (PF) 2 MG/ML IV SOLN
0.5000 mg | INTRAVENOUS | Status: DC | PRN
  Filled 2016-03-31: qty 1

## 2016-03-31 MED ORDER — SUCCINYLCHOLINE CHLORIDE 200 MG/10ML IV SOSY
PREFILLED_SYRINGE | INTRAVENOUS | Status: DC | PRN
  Administered 2016-03-31: 100 mg via INTRAVENOUS

## 2016-03-31 MED ORDER — ROCURONIUM BROMIDE 50 MG/5ML IV SOSY
PREFILLED_SYRINGE | INTRAVENOUS | Status: AC
  Filled 2016-03-31: qty 5

## 2016-03-31 MED ORDER — ONDANSETRON HCL 4 MG/2ML IJ SOLN
INTRAMUSCULAR | Status: DC | PRN
  Administered 2016-03-31: 4 mg via INTRAVENOUS

## 2016-03-31 MED ORDER — MENTHOL 3 MG MT LOZG: 1.0000 | LOZENGE | OROMUCOSAL | Status: DC | PRN

## 2016-03-31 MED ORDER — HYDROCODONE-ACETAMINOPHEN 10-325 MG PO TABS
1.0000 | ORAL_TABLET | Freq: Four times a day (QID) | ORAL | Status: DC | PRN
Start: 2016-03-31 — End: 2016-04-02
  Administered 2016-03-31 (×2): 1 via ORAL
  Filled 2016-03-31 (×3): qty 1

## 2016-03-31 MED ORDER — ACETAMINOPHEN 325 MG PO TABS
650.0000 mg | ORAL_TABLET | Freq: Four times a day (QID) | ORAL | Status: DC | PRN
Start: 2016-03-31 — End: 2016-04-02

## 2016-03-31 MED ORDER — FENTANYL CITRATE (PF) 100 MCG/2ML IJ SOLN
50.0000 ug | Freq: Once | INTRAMUSCULAR | Status: AC
  Administered 2016-03-31: 50 ug via INTRAVENOUS
  Filled 2016-03-31: qty 2

## 2016-03-31 MED ORDER — FENTANYL CITRATE (PF) 100 MCG/2ML IJ SOLN
25.0000 ug | INTRAMUSCULAR | Status: DC | PRN
Start: 2016-03-31 — End: 2016-04-02
  Administered 2016-03-31 – 2016-04-02 (×12): 25 ug via INTRAVENOUS
  Filled 2016-03-31 (×12): qty 2

## 2016-03-31 MED ORDER — LIDOCAINE 2% (20 MG/ML) 5 ML SYRINGE
INTRAMUSCULAR | Status: AC
  Filled 2016-03-31: qty 5

## 2016-03-31 MED ORDER — HYDROMORPHONE HCL 1 MG/ML IJ SOLN
0.2500 mg | INTRAMUSCULAR | Status: DC | PRN
  Administered 2016-03-31 (×3): 0.5 mg via INTRAVENOUS

## 2016-03-31 MED ORDER — LACTATED RINGERS IV SOLN
INTRAVENOUS | Status: DC
  Administered 2016-03-31 (×2): via INTRAVENOUS

## 2016-03-31 MED ORDER — DEXAMETHASONE SODIUM PHOSPHATE 10 MG/ML IJ SOLN
INTRAMUSCULAR | Status: DC | PRN
  Administered 2016-03-31: 10 mg via INTRAVENOUS

## 2016-03-31 MED ORDER — PHENOL 1.4 % MT LIQD: 1.0000 | OROMUCOSAL | Status: DC | PRN

## 2016-03-31 MED ORDER — METOPROLOL TARTRATE 25 MG PO TABS
25.0000 mg | ORAL_TABLET | Freq: Two times a day (BID) | ORAL | Status: DC
  Administered 2016-03-31 – 2016-04-02 (×5): 25 mg via ORAL
  Filled 2016-03-31 (×5): qty 1

## 2016-03-31 MED ORDER — HYDROMORPHONE HCL 1 MG/ML IJ SOLN
INTRAMUSCULAR | Status: AC
  Filled 2016-03-31: qty 1

## 2016-03-31 MED ORDER — SUGAMMADEX SODIUM 200 MG/2ML IV SOLN
INTRAVENOUS | Status: AC
  Filled 2016-03-31: qty 2

## 2016-03-31 MED ORDER — ATORVASTATIN CALCIUM 10 MG PO TABS
20.0000 mg | ORAL_TABLET | Freq: Every day | ORAL | Status: DC
  Administered 2016-04-01: 20 mg via ORAL
  Filled 2016-03-31: qty 1
  Filled 2016-03-31 (×2): qty 2

## 2016-03-31 MED ORDER — PROPOFOL 10 MG/ML IV BOLUS
INTRAVENOUS | Status: DC | PRN
  Administered 2016-03-31: 100 mg via INTRAVENOUS

## 2016-03-31 MED ORDER — METOCLOPRAMIDE HCL 5 MG PO TABS: 5.0000 mg | ORAL_TABLET | Freq: Three times a day (TID) | ORAL | Status: DC | PRN

## 2016-03-31 MED ORDER — PROMETHAZINE HCL 25 MG/ML IJ SOLN: 6.2500 mg | INTRAMUSCULAR | Status: DC | PRN

## 2016-03-31 MED ORDER — CLINDAMYCIN PHOSPHATE 900 MG/50ML IV SOLN
900.0000 mg | INTRAVENOUS | Status: AC
  Administered 2016-03-31: 900 mg via INTRAVENOUS

## 2016-03-31 MED ORDER — ASPIRIN EC 325 MG PO TBEC
325.0000 mg | DELAYED_RELEASE_TABLET | Freq: Every day | ORAL | Status: DC
  Administered 2016-04-01 – 2016-04-02 (×2): 325 mg via ORAL
  Filled 2016-03-31 (×2): qty 1

## 2016-03-31 MED ORDER — FENTANYL CITRATE (PF) 100 MCG/2ML IJ SOLN
INTRAMUSCULAR | Status: AC
  Filled 2016-03-31: qty 2

## 2016-03-31 MED ORDER — CLINDAMYCIN PHOSPHATE 600 MG/50ML IV SOLN
600.0000 mg | Freq: Four times a day (QID) | INTRAVENOUS | Status: AC
  Administered 2016-04-01 (×2): 600 mg via INTRAVENOUS
  Filled 2016-03-31 (×2): qty 50

## 2016-03-31 MED ORDER — SUCCINYLCHOLINE CHLORIDE 200 MG/10ML IV SOSY
PREFILLED_SYRINGE | INTRAVENOUS | Status: AC
  Filled 2016-03-31: qty 10

## 2016-03-31 MED ORDER — SUGAMMADEX SODIUM 200 MG/2ML IV SOLN
INTRAVENOUS | Status: DC | PRN
  Administered 2016-03-31: 150 mg via INTRAVENOUS

## 2016-03-31 MED ORDER — ONDANSETRON HCL 4 MG/2ML IJ SOLN: 4.0000 mg | Freq: Four times a day (QID) | INTRAMUSCULAR | Status: DC | PRN

## 2016-03-31 MED ORDER — GABAPENTIN 300 MG PO CAPS
600.0000 mg | ORAL_CAPSULE | Freq: Four times a day (QID) | ORAL | Status: DC
  Administered 2016-03-31 – 2016-04-02 (×9): 600 mg via ORAL
  Filled 2016-03-31 (×9): qty 2

## 2016-03-31 MED ORDER — HYDROMORPHONE HCL 1 MG/ML IJ SOLN
INTRAMUSCULAR | Status: AC
  Filled 2016-03-31: qty 0.5

## 2016-03-31 MED ORDER — ROCURONIUM BROMIDE 50 MG/5ML IV SOSY
PREFILLED_SYRINGE | INTRAVENOUS | Status: DC | PRN
  Administered 2016-03-31: 20 mg via INTRAVENOUS

## 2016-03-31 MED ORDER — EPHEDRINE SULFATE 50 MG/ML IJ SOLN
INTRAMUSCULAR | Status: DC | PRN
  Administered 2016-03-31: 5 mg via INTRAVENOUS

## 2016-03-31 MED ORDER — FENTANYL CITRATE (PF) 100 MCG/2ML IJ SOLN
INTRAMUSCULAR | Status: DC | PRN
  Administered 2016-03-31 (×2): 50 ug via INTRAVENOUS

## 2016-03-31 MED ORDER — LIDOCAINE 2% (20 MG/ML) 5 ML SYRINGE
INTRAMUSCULAR | Status: DC | PRN
  Administered 2016-03-31: 80 mg via INTRAVENOUS

## 2016-03-31 MED ORDER — SODIUM CHLORIDE 0.9 % IV SOLN
INTRAVENOUS | Status: DC
  Administered 2016-03-31 – 2016-04-01 (×4): via INTRAVENOUS

## 2016-03-31 MED ORDER — METOCLOPRAMIDE HCL 5 MG/ML IJ SOLN: 5.0000 mg | Freq: Three times a day (TID) | INTRAMUSCULAR | Status: DC | PRN

## 2016-03-31 MED ORDER — AMLODIPINE BESYLATE 5 MG PO TABS
5.0000 mg | ORAL_TABLET | Freq: Every day | ORAL | Status: DC
  Administered 2016-03-31 – 2016-04-02 (×3): 5 mg via ORAL
  Filled 2016-03-31 (×3): qty 1

## 2016-03-31 MED ORDER — ONDANSETRON HCL 4 MG PO TABS: 4.0000 mg | ORAL_TABLET | Freq: Four times a day (QID) | ORAL | Status: DC | PRN

## 2016-03-31 MED ORDER — CHLORHEXIDINE GLUCONATE 4 % EX LIQD: 60.0000 mL | Freq: Once | CUTANEOUS | Status: DC

## 2016-03-31 MED ORDER — PROPOFOL 10 MG/ML IV BOLUS
INTRAVENOUS | Status: AC
  Filled 2016-03-31: qty 20

## 2016-03-31 MED ORDER — POVIDONE-IODINE 10 % EX SWAB: 2.0000 "application " | Freq: Once | CUTANEOUS | Status: DC

## 2016-03-31 MED ORDER — HYDROCODONE-ACETAMINOPHEN 5-325 MG PO TABS
1.0000 | ORAL_TABLET | Freq: Four times a day (QID) | ORAL | Status: DC | PRN
  Administered 2016-04-01 – 2016-04-02 (×5): 2 via ORAL
  Filled 2016-03-31 (×5): qty 2

## 2016-03-31 MED ORDER — PHENYLEPHRINE HCL 10 MG/ML IJ SOLN
INTRAMUSCULAR | Status: DC | PRN
  Administered 2016-03-31 (×3): 80 ug via INTRAVENOUS

## 2016-03-31 MED ORDER — PANTOPRAZOLE SODIUM 40 MG PO TBEC
40.0000 mg | DELAYED_RELEASE_TABLET | Freq: Every day | ORAL | Status: DC
Start: 2016-03-31 — End: 2016-04-02
  Administered 2016-03-31 – 2016-04-02 (×3): 40 mg via ORAL
  Filled 2016-03-31 (×3): qty 1

## 2016-03-31 MED ORDER — CLINDAMYCIN PHOSPHATE 900 MG/50ML IV SOLN
INTRAVENOUS | Status: AC
  Filled 2016-03-31: qty 50

## 2016-03-31 MED ORDER — ACETAMINOPHEN 650 MG RE SUPP
650.0000 mg | Freq: Four times a day (QID) | RECTAL | Status: DC | PRN
Start: 2016-03-31 — End: 2016-04-02

## 2016-03-31 MED ORDER — VITAMIN B-12 1000 MCG PO TABS
1000.0000 ug | ORAL_TABLET | Freq: Every day | ORAL | Status: DC
  Administered 2016-03-31 – 2016-04-02 (×3): 1000 ug via ORAL
  Filled 2016-03-31 (×3): qty 1

## 2016-03-31 SURGICAL SUPPLY — 39 items
BAG ZIPLOCK 12X15 (MISCELLANEOUS) ×3 IMPLANT
BIT DRILL 5 ACE CANN QC (BIT) ×3 IMPLANT
BNDG GAUZE ELAST 4 BULKY (GAUZE/BANDAGES/DRESSINGS) ×3 IMPLANT
DRAPE STERI IOBAN 125X83 (DRAPES) ×3 IMPLANT
DRSG MEPILEX BORDER 4X8 (GAUZE/BANDAGES/DRESSINGS) ×3 IMPLANT
DRSG PAD ABDOMINAL 8X10 ST (GAUZE/BANDAGES/DRESSINGS) ×3 IMPLANT
DRSG TEGADERM 4X4.75 (GAUZE/BANDAGES/DRESSINGS) IMPLANT
DURAPREP 26ML APPLICATOR (WOUND CARE) ×3 IMPLANT
ELECT REM PT RETURN 9FT ADLT (ELECTROSURGICAL) ×3
ELECTRODE REM PT RTRN 9FT ADLT (ELECTROSURGICAL) ×1 IMPLANT
FACESHIELD WRAPAROUND (MASK) IMPLANT
GAUZE SPONGE 4X4 12PLY STRL (GAUZE/BANDAGES/DRESSINGS) ×3 IMPLANT
GAUZE XEROFORM 5X9 LF (GAUZE/BANDAGES/DRESSINGS) ×3 IMPLANT
GLOVE BIO SURGEON STRL SZ7.5 (GLOVE) ×3 IMPLANT
GLOVE BIOGEL PI IND STRL 8 (GLOVE) ×1 IMPLANT
GLOVE BIOGEL PI INDICATOR 8 (GLOVE) ×2
GLOVE ECLIPSE 8.0 STRL XLNG CF (GLOVE) ×3 IMPLANT
GOWN STRL REUS W/TWL XL LVL3 (GOWN DISPOSABLE) ×3 IMPLANT
NS IRRIG 1000ML POUR BTL (IV SOLUTION) ×3 IMPLANT
PACK GENERAL/GYN (CUSTOM PROCEDURE TRAY) ×3 IMPLANT
PAD CAST 4YDX4 CTTN HI CHSV (CAST SUPPLIES) ×1 IMPLANT
PADDING CAST COTTON 4X4 STRL (CAST SUPPLIES) ×2
PIN THREADED GUIDE ACE (PIN) ×9 IMPLANT
POSITIONER SURGICAL ARM (MISCELLANEOUS) ×3 IMPLANT
SCREW CANN 6.5 75MM (Screw) ×4 IMPLANT
SCREW CANN 6.5 80MM (Screw) ×2 IMPLANT
SCREW CANN LG 6.5 FLT 75X22 (Screw) ×2 IMPLANT
SCREW CANN LG 6.5 FLT 80X22 (Screw) ×1 IMPLANT
SPONGE LAP 18X18 X RAY DECT (DISPOSABLE) IMPLANT
STAPLER VISISTAT 35W (STAPLE) ×3 IMPLANT
SUT VIC AB 0 CT1 27 (SUTURE) ×2
SUT VIC AB 0 CT1 27XBRD ANTBC (SUTURE) ×1 IMPLANT
SUT VIC AB 1 CT1 27 (SUTURE) ×4
SUT VIC AB 1 CT1 27XBRD ANTBC (SUTURE) ×2 IMPLANT
SUT VIC AB 2-0 CT1 27 (SUTURE) ×2
SUT VIC AB 2-0 CT1 TAPERPNT 27 (SUTURE) ×1 IMPLANT
TOWEL OR 17X26 10 PK STRL BLUE (TOWEL DISPOSABLE) ×6 IMPLANT
TRAY FOLEY W/METER SILVER 16FR (SET/KITS/TRAYS/PACK) IMPLANT
WATER STERILE IRR 1500ML POUR (IV SOLUTION) IMPLANT

## 2016-03-31 NOTE — Anesthesia Procedure Notes (Signed)
Procedure Name: Intubation Date/Time: 03/31/2016 5:41 PM Performed by: Wynonia SoursWALKER, Kyung Muto L Pre-anesthesia Checklist: Patient identified, Emergency Drugs available, Suction available, Patient being monitored and Timeout performed Patient Re-evaluated:Patient Re-evaluated prior to inductionOxygen Delivery Method: Circle system utilized Preoxygenation: Pre-oxygenation with 100% oxygen Intubation Type: IV induction Ventilation: Mask ventilation without difficulty Laryngoscope Size: Mac and 4 Grade View: Grade I Tube type: Oral Tube size: 7.0 mm Number of attempts: 1 Airway Equipment and Method: Stylet Placement Confirmation: ETT inserted through vocal cords under direct vision,  positive ETCO2,  CO2 detector and breath sounds checked- equal and bilateral Secured at: 22 cm Tube secured with: Tape Dental Injury: Teeth and Oropharynx as per pre-operative assessment

## 2016-03-31 NOTE — Transfer of Care (Signed)
Immediate Anesthesia Transfer of Care Note  Patient: Sara [name]  Procedure(s) Performed: Procedure(s): CANNULATED HIP PINNING (Right)  Patient Location: PACU  Anesthesia Type:General  Level of Consciousness: sedated  Airway & Oxygen Therapy: Patient Spontanous Breathing and Patient connected to face mask oxygen  Post-op Assessment: Report given to RN and Post -op Vital signs reviewed and stable  Post vital signs: Reviewed and stable  Last Vitals:  Vitals:   03/31/16 0642 03/31/16 1431  BP: (!) 162/64 (!) 159/57  Pulse: 67 65  Resp: 18 20  Temp: 36.6 C 36.4 C    Last Pain:  Vitals:   03/31/16 1600  TempSrc:   PainSc: Asleep      Patients Stated Pain Goal: 1 (03/31/16 1502)  Complications: No apparent anesthesia complications

## 2016-03-31 NOTE — Consult Note (Signed)
Reason for Consult: Right hip fracture Referring Physician: Dr. Randol KernElgergawy Consulting Physician:James E Otelia SergeantNitka  Orthopedic Diagnosis: Garden 1 valgus impacted nondisplaced right femoral neck fracture. Osteoporosis. History of coronary artery disease history of atherosclerotic carotid artery disease  ION:[id number]:Sara [name] is an 75 y.o. female. She has had a past history of osteoporosis and is on no bone building agents. Reportedly fell last evening getting out of the tub. Landing on her right hip unable to stand or ambulate due to severe right hip pain seen in the emergency room this morning. Radiographs demonstrated a right nondisplaced emerald neck fracture with slight angulation impacted valgus. She's had history of previous left hip pinning by Dr. Allie Bossierhris Blackman October 2016. Also past history of nontraumatic compression fracture of the dorsal spine. She is seen today at request of the emergency room as she has been treated previously by Dr. Allie Bossierhris Blackman at [facility]. No particular leg numbness or paresthesias. No other areas of physical discomfort is primarily right hip. She is admitted to medicine service early this morning.   Past Medical History:  Diagnosis Date  . Anxiety   . Arthritis   . Bronchitis    hx of  . Carotid artery occlusion   . Chronic pain syndrome    pain management  . Coronary artery disease    sees Dr. Patty SermonsBrackbill  . GERD (gastroesophageal reflux disease)    hx of  . H/O hiatal hernia   . Hypercholesteremia   . Hypertension    all managed by Dr. Haynes DageBrack bill  . Mitral valve regurgitation    trace  . Non-traumatic compression fracture of thoracic vertebra (HCC)    T9  . Stroke [facility](HCC)     Past Surgical History:  Procedure Laterality Date  . BACK SURGERY     x 3 Dr Juliene PinaGeoffrey, [facility], rods, fusion, four surgery  . CARDIAC CATHETERIZATION  1994  . CAROTID ENDARTERECTOMY    . CARPAL TUNNEL RELEASE     left  . CHOLECYSTECTOMY    .  ENDARTERECTOMY  04/24/2012   Procedure: ENDARTERECTOMY CAROTID;  Surgeon: Larina Earthlyodd F Early, MD;  [facility];  Service: Vascular;  Laterality: Left;  . HAND SURGERY     bilateral  . HIP FRACTURE SURGERY Left   . HIP PINNING,CANNULATED Left 02/08/2015   Procedure: CANNULATED HIP PINNING;  Surgeon: Kathryne Hitchhristopher Y Blackman, MD;  Location: [facility] ORS;  Service: Orthopedics;  Laterality: Left;  . HYSTEROTOMY    . SPINAL CORD STIMULATOR INSERTION    . TEE WITHOUT CARDIOVERSION  04/12/2012   Procedure: TRANSESOPHAGEAL ECHOCARDIOGRAM (TEE);  Surgeon: Lewayne BuntingBrian S Crenshaw, MD;  [facility];  Service: Cardiovascular;  Laterality: N/A;    Family History  Problem Relation Age of Onset  . Heart attack Father   . Heart disease Father     before age 77  . Heart disease Sister   . Cancer Sister   . Heart disease Sister     Social History:  reports that she has quit smoking. Her smoking use included Cigarettes. She has a 30.00 pack-year smoking history. She has never used smokeless tobacco. She reports that she does not drink alcohol or use drugs.  Allergies:  Allergies  Allergen Reactions  . Percocet [Oxycodone-Acetaminophen] Rash  . Hydrochlorothiazide     Pt does not remember  . Indomethacin     Pt does not remember  . Paroxetine Hcl     Pt does not remember  . Penicillins  Has patient had a PCN reaction causing immediate rash, facial/tongue/throat swelling, SOB or lightheadedness with hypotension: Yes Has patient had a PCN reaction causing severe rash involving mucus membranes or skin necrosis: No Has patient had a PCN reaction that required hospitalization No Has patient had a PCN reaction occurring within the last 10 years: No If all of the above answers are "NO", then may proceed with Cephalosporin use.   . Pravachol     Pt does not remember  . Quinine Derivatives     Pt does not remember  . Wellbutrin [Bupropion]     agitation  . Zocor [Simvastatin - High Dose] Other (See  Comments)    Weakness/no energy  . Morphine And Related Rash    Medications:  Prior to Admission:  Prescriptions Prior to Admission  Medication Sig Dispense Refill Last Dose  . aspirin EC 81 MG tablet Take 81 mg by mouth daily.   03/30/2016 at am   . atorvastatin (LIPITOR) 40 MG tablet Take 0.5 tablets (20 mg total) by mouth daily. 15 tablet 1 03/30/2016 at Unknown time  . diazepam (VALIUM) 5 MG tablet Take 1 tablet (5 mg total) by mouth daily as needed. 30 tablet 2 Past Month at Unknown time  . gabapentin (NEURONTIN) 600 MG tablet Take 1 tablet (600 mg total) by mouth 4 (four) times daily. 120 tablet 6 03/30/2016 at pm  . HYDROcodone-acetaminophen (NORCO) 10-325 MG tablet Take 1 tablet by mouth every 6 (six) hours as needed for moderate pain (for pain). 60 tablet 0 03/30/2016 at Unknown time  . metoprolol (LOPRESSOR) 50 MG tablet Take 0.5 tablets (25 mg total) by mouth 2 (two) times daily. 30 tablet 5 03/30/2016 at 1800  . omeprazole (PRILOSEC) 20 MG capsule Take 1 capsule (20 mg total) by mouth daily. 30 capsule 5 03/30/2016 at am  . vitamin B-12 (CYANOCOBALAMIN) 1000 MCG tablet Take 1,000 mcg by mouth daily.   03/30/2016 at Unknown time  . amLODipine (NORVASC) 10 MG tablet TAKE 1 TABLET BY MOUTH EVERY DAY (Patient taking differently: TAKE 1/2 TABLET BY MOUTH EVERY DAY) 30 tablet 6 1800  . lidocaine (LIDODERM) 5 % Place 2 patches onto the skin daily. Remove & Discard patch within 12 hours or as directed by MD (Patient not taking: Reported on 02/24/2016) 30 patch 6 Not Taking   Scheduled: . amLODipine  5 mg Oral Daily  . atorvastatin  20 mg Oral Daily  . gabapentin  600 mg Oral QID  . metoprolol  25 mg Oral BID  . pantoprazole  40 mg Oral Daily  . vitamin B-12  1,000 mcg Oral Daily   ZOX:WRUEAVWU (SUBLIMAZE) injection, HYDROcodone-acetaminophen  Results for orders placed or performed during the hospital encounter of 03/30/16 (from the past 48 hour(s))  CBC with Differential/Platelet      Status: Abnormal   Collection Time: 03/31/16 12:28 AM  Result Value Ref Range   WBC 10.9 (H) 4.0 - 10.5 K/uL   RBC 3.68 (L) 3.87 - 5.11 MIL/uL   Hemoglobin 11.3 (L) 12.0 - 15.0 g/dL   HCT 98.1 (L) 19.1 - 47.8 %   MCV 93.8 78.0 - 100.0 fL   MCH 30.7 26.0 - 34.0 pg   MCHC 32.8 30.0 - 36.0 g/dL   RDW 29.5 62.1 - 30.8 %   Platelets 240 150 - 400 K/uL   Neutrophils Relative % 64 %   Neutro Abs 6.9 1.7 - 7.7 K/uL   Lymphocytes Relative 27 %   Lymphs Abs 3.0  0.7 - 4.0 K/uL   Monocytes Relative 7 %   Monocytes Absolute 0.8 0.1 - 1.0 K/uL   Eosinophils Relative 2 %   Eosinophils Absolute 0.3 0.0 - 0.7 K/uL   Basophils Relative 0 %   Basophils Absolute 0.0 0.0 - 0.1 K/uL  I-Stat Chem 8, ED     Status: Abnormal   Collection Time: 03/31/16 12:39 AM  Result Value Ref Range   Sodium 142 135 - 145 mmol/L   Potassium 3.7 3.5 - 5.1 mmol/L   Chloride 105 101 - 111 mmol/L   BUN 22 (H) 6 - 20 mg/dL   Creatinine, Ser 1.61 (H) 0.44 - 1.00 mg/dL   Glucose, Bld 99 65 - 99 mg/dL   Calcium, Ion 0.96 0.45 - 1.40 mmol/L   TCO2 26 0 - 100 mmol/L   Hemoglobin 11.6 (L) 12.0 - 15.0 g/dL   HCT 40.9 (L) 81.1 - 91.4 %  Type and screen [facility]     Status: None (Preliminary result)   Collection Time: 03/31/16  7:00 AM  Result Value Ref Range   ABO/RH(D) A POS    Antibody Screen PENDING    Sample Expiration 04/03/2016     Dg Chest 2 View  Result Date: 03/31/2016 CLINICAL DATA:  Fall getting out of the bath tub this evening. Right hip pain. EXAM: CHEST  2 VIEW COMPARISON:  Radiographs 02/11/2016 FINDINGS: Lung volumes are low. Unchanged heart size and mediastinal contours. There is atherosclerosis of the thoracic aorta. No pulmonary edema, pleural fluid, pneumothorax or focal airspace disease. Spinal stimulator in place. Kyphoplasty within midthoracic vertebra. The bones appear under mineralized. No acute osseous abnormality is seen. IMPRESSION: Low lung volumes without evidence of acute  abnormality. Electronically Signed   By: Rubye Oaks M.D.   On: 03/31/2016 00:55   Ct Head Wo Contrast  Result Date: 03/31/2016 CLINICAL DATA:  Slipped and fell getting out of bathtub at 1900 hours. RIGHT hip pain. No head injury or loss of consciousness. History of stroke, hypertension and hyperlipidemia. EXAM: CT HEAD WITHOUT CONTRAST CT CERVICAL SPINE WITHOUT CONTRAST TECHNIQUE: Multidetector CT imaging of the head and cervical spine was performed following the standard protocol without intravenous contrast. Multiplanar CT image reconstructions of the cervical spine were also generated. COMPARISON:  CT HEAD June 04, 2015 FINDINGS: CT HEAD FINDINGS BRAIN: The ventricles and sulci are normal for age. No intraparenchymal hemorrhage, mass effect nor midline shift. Patchy supratentorial white matter hypodensities less than expected for patient's age, though non-specific are most compatible with chronic small vessel ischemic disease. Small area LEFT frontal encephalomalacia. No acute large vascular territory infarcts. No abnormal extra-axial fluid collections. Basal cisterns are patent. VASCULAR: Moderate calcific atherosclerosis of the carotid siphons. SKULL: No skull fracture. 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 No significant scalp soft tissue swelling. SINUSES/ORBITS: The mastoid air-cells and included paranasal sinuses are well-aerated.The included ocular globes and orbital contents are non-suspicious. OTHER: None. CT CERVICAL SPINE FINDINGS ALIGNMENT: Maintained lordosis. Vertebral bodies in alignment. SKULL BASE AND VERTEBRAE: Cervical vertebral bodies and posterior elements are intact. Intervertebral disc heights preserved. No destructive bony lesions. Severe LEFT facet arthropathy. C1-2 articulation maintained. SOFT TISSUES AND SPINAL CANAL: Nonacute. Moderate calcific atherosclerosis RIGHT carotid bulb. Asymmetrically enlarged LEFT Common carotid artery. 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 Surgical clips LEFT neck. DISC LEVELS: No significant osseous canal stenosis. Moderate LEFT C3-4 neural foraminal narrowing. UPPER CHEST: Apical calcified pleural plaques. OTHER: None. IMPRESSION: CT HEAD: No  acute intracranial process. Old small LEFT frontal lobe infarct, otherwise negative CT HEAD for age. CT CERVICAL SPINE: No acute fracture or malalignment. At least 4 cm aneurysmal thoracic aorta. Recommend CTA chest on a NONEMERGENT basis for further characterization. Electronically Signed   By: Awilda Metroourtnay  Bloomer M.D.   On: 03/31/2016 01:14   Ct Cervical Spine Wo Contrast  Result Date: 03/31/2016 CLINICAL DATA:  Slipped and fell getting out of bathtub at 1900 hours. RIGHT hip pain. No head injury or loss of consciousness. History of stroke, hypertension and hyperlipidemia. EXAM: CT HEAD WITHOUT CONTRAST CT CERVICAL SPINE WITHOUT CONTRAST TECHNIQUE: Multidetector CT imaging of the head and cervical spine was performed following the standard protocol without intravenous contrast. Multiplanar CT image reconstructions of the cervical spine were also generated. COMPARISON:  CT HEAD June 04, 2015 FINDINGS: CT HEAD FINDINGS BRAIN: The ventricles and sulci are normal for age. No intraparenchymal hemorrhage, mass effect nor midline shift. Patchy supratentorial white matter hypodensities less than expected for patient's age, though non-specific are most compatible with chronic small vessel ischemic disease. Small area LEFT frontal encephalomalacia. No acute large vascular territory infarcts. No abnormal extra-axial fluid collections. Basal cisterns are patent. VASCULAR: Moderate calcific atherosclerosis of the carotid siphons. SKULL: No skull fracture. 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 No significant scalp soft tissue swelling. SINUSES/ORBITS: The mastoid air-cells and included paranasal sinuses are well-aerated.The included ocular globes and orbital  contents are non-suspicious. OTHER: None. CT CERVICAL SPINE FINDINGS ALIGNMENT: Maintained lordosis. Vertebral bodies in alignment. SKULL BASE AND VERTEBRAE: Cervical vertebral bodies and posterior elements are intact. Intervertebral disc heights preserved. No destructive bony lesions. Severe LEFT facet arthropathy. C1-2 articulation maintained. SOFT TISSUES AND SPINAL CANAL: Nonacute. Moderate calcific atherosclerosis RIGHT carotid bulb. Asymmetrically enlarged LEFT Common carotid artery. Aneurysmal thoracic aorta at at least 4 cm, incompletely imaged. Surgical clips LEFT neck. DISC LEVELS: No significant osseous canal stenosis. Moderate LEFT C3-4 neural foraminal narrowing. UPPER CHEST: Apical calcified pleural plaques. OTHER: None. IMPRESSION: CT HEAD: No acute intracranial process. Old small LEFT frontal lobe infarct, otherwise negative CT HEAD for age. CT CERVICAL SPINE: No acute fracture or malalignment. At least 4 cm aneurysmal thoracic aorta. Recommend CTA chest on a NONEMERGENT basis for further characterization. Electronically Signed   By: Awilda Metroourtnay  Bloomer M.D.   On: 03/31/2016 01:14   Dg Hip Unilat W Or Wo Pelvis 2-3 Views Right  Result Date: 03/31/2016 CLINICAL DATA:  Larey SeatFell getting out of bathtub this evening. RIGHT hip pain. EXAM: DG HIP (WITH OR WITHOUT PELVIS) 2-3V RIGHT COMPARISON:  None. FINDINGS: Slight cortical irregularity of the femoral neck with faint sclerosis. No dislocation. Status post LEFT femoral neck pinning with intact hardware. PLIF, spinal stimulator battery pack projects in RIGHT abdomen. There is no evidence of arthropathy or other focal bone abnormality. IMPRESSION: Suspected acute nondisplaced RIGHT femoral neck fracture without dislocation. Electronically Signed   By: Awilda Metroourtnay  Bloomer M.D.   On: 03/31/2016 00:56    ROS Blood pressure (!) 162/64, pulse 67, temperature 97.9 F (36.6 C), temperature source Oral, resp. rate 18, height 5\' 4"  (1.626 m), weight 140 lb (63.5  kg), SpO2 99 %. Physical Exam she is awake alert and oriented 4 as oxygen 2 L per nasal cannula. Saturations noted to be somewhat low while on narcotic medicines. She is oriented 4 and able to discuss her condition readily. Head and neck is atraumatic. Neck supple with full range of motion. She is breathing easily with 2 L nasal cannula. Abdomen soft lower  extremity leg lengths are equal there is no abnormal rotation about the legs on either side nor shortening noted. Ulcer normal in both lower extremities and motor is normal in both legs. 3 is intact in both lower extremities.  Orthopaedic Exam: Clinical exam shows she is tender over the right lateral anterior right hip complains of persistent pain here. His comfort with any attempts at range of motion of the right hip. No attempts at extreme range per performed. Right lower extremity is warm and sensate with normal motor in the distal right leg noted. Patient's plain radiographs demonstrate a minimally impacted valgus right femoral neck fracture displaced on lateral radiograph T radiograph with minimal valgus relation. Left Amaral neck pinning. Also has radiographic signs of previous hardware in the lumbar spine.  Assessment/Plan: Closed nondisplaced valgus impacted right femoral neck fracture Garden 1. History of osteoporosis and not treated currently. History of previous thoracic compression fracture and left hip fracture. History of atherosclerotic coronary artery disease and carotid artery disease.  Plan: I saw the patient this morning examined her .She is a candidate for a right hip pinning and her management may require skilled nursing facility postoperatively in order to regain status of activities of daily living. Recommend vitamin D level be performed and consideration of beginning bone building agents with DEXA scan when fracture as is nearly healed. Consider Miacalcin during early phase of fracture treatment certainly calcium and vitamin D  supplements. I called and spoke with Dr. Allie Bossier and he will see this patient at noon time. I request that she be held NPOfollowing a clear liquid breakfast this morning and no anticoagulation begun until postoperative status. Expect she will likely have surgery this evening. Discussed with patient.  Kerrin Champagne 03/31/2016, 7:44 AM

## 2016-03-31 NOTE — Anesthesia Postprocedure Evaluation (Signed)
Anesthesia Post Note  Patient: Sara [name]  Procedure(s) Performed: Procedure(s) (LRB): CANNULATED HIP PINNING (Right)  Patient location during evaluation: PACU Anesthesia Type: General Level of consciousness: sedated Pain management: pain level controlled Vital Signs Assessment: post-procedure vital signs reviewed and stable Respiratory status: spontaneous breathing and respiratory function stable Cardiovascular status: stable Anesthetic complications: no    Last Vitals:  Vitals:   03/31/16 1949 03/31/16 2004  BP: (!) 150/97 (!) 142/73  Pulse: 72 71  Resp: 12   Temp: 36.4 C 36.5 C    Last Pain:  Vitals:   03/31/16 2004  TempSrc: Oral  PainSc: 4                  Masako Overall DANIEL

## 2016-03-31 NOTE — Progress Notes (Signed)
LCSWA met with patient and family at bedside, explained reason for CSW consult to assist with SNF placement. Family reports the plan is for patient to go home with HHPT services. Family reports the patient did well with HHPT with previous fracture.  LCSWA informed RNCM about patient and family request.  No needs identified.  CSW signing off at this time. Please reconsult if needed.   Kathrin Greathouse, Latanya Presser, MSW Clinical Social Worker 5E and Psychiatric Service Line [phone] 03/31/2016  11:07 AM

## 2016-03-31 NOTE — Progress Notes (Signed)
Pt arrived to room 1501. Pt A&Ox4. Left IV site WNL, infusing. Pt skin intact. Family at bedside. Oriented to room, call bell and pain scale. Will continue to monitor.

## 2016-03-31 NOTE — Progress Notes (Signed)
Patient ID: [name], female   DOB: 12/18/1940, 75 y.o.   MRN: [id number] I have seen and examined [name].  She is well-known to me and had a similar fracture last year of her left hip.  We have her on the OR schedule this evening for cannulated screw placement (hip pinning) for her right hip non-displaced femoral neck fracture.  She understands this fully as well as the risks and benefits involved.

## 2016-03-31 NOTE — Brief Op Note (Signed)
03/30/2016 - 03/31/2016  6:42 PM  PATIENT:  Sara [name]  75 y.o. female  PRE-OPERATIVE DIAGNOSIS:  RIGHT HIP FEMORAL NECK FRACTURE  POST-OPERATIVE DIAGNOSIS:  RIGHT HIP FEMORAL NECK FRACTURE  PROCEDURE:  Procedure(s): CANNULATED HIP PINNING (Right)  SURGEON:  Surgeon(s) and Role:    * Kathryne Hitchhristopher Y Blackman, MD - Primary  ANESTHESIA:   general  EBL:  Total I/O In: 1940 [I.V.:1940] Out: 50 [Blood:50]  COUNTS:  YES  TOURNIQUET:  * No tourniquets in log *  DICTATION: .Other Dictation: Dictation Number [id number]  PLAN OF CARE: Admit to inpatient   PATIENT DISPOSITION:  PACU - hemodynamically stable.   Delay start of Pharmacological VTE agent (>24hrs) due to surgical blood loss or risk of bleeding: no

## 2016-03-31 NOTE — H&P (Signed)
History and Physical    Sara [name] VWU:[id number] DOB: Jan 05, 1941 DOA: 03/30/2016  Referring MD/NP/PA: Dr. Nicanor Alcon PCP: [name], MD  Patient coming from: Home via EMS  Chief Complaint: Fall  HPI: Sara [name] is a 76 y.o. female with medical history significant of HTN, HLD, CAD, chronic pain, and thoracic aortic aneurysm; who presents after having a fall at home complaining of right hip pain. Patient notes that she was getting out of the bathtub around 7:00 PM when she slipped on the floor and landed on her right side. Denies having any loss of consciousness or trauma to her head. Thereafter she notes immediate onset of right hip pain radiating into her groin. Pain was worsened with any movement of the affected leg. She denies having any dysuria, shortness of breath, chest pain, fever, or chills. Patient also reports that she's been daily with a compound fracture of her back and had broken her left hip one year ago, and that was fixed by Dr. Magnus Ivan.  ED Course: Upon admission to the emergency department patient was evaluated and seen have an acute right femoral neck fracture. Laboratory revealed WBC of 10.9, BUN 22, and creatinine 1.10. Dr. Otelia Sergeant of [facility] orthopedics was consulted and will see patient in a.m.  Review of Systems: As per HPI otherwise 10 point review of systems negative.   Past Medical History:  Diagnosis Date  . Anxiety   . Arthritis   . Bronchitis    hx of  . Carotid artery occlusion   . Chronic pain syndrome    pain management  . Coronary artery disease    sees Dr. Patty Sermons  . GERD (gastroesophageal reflux disease)    hx of  . H/O hiatal hernia   . Hypercholesteremia   . Hypertension    all managed by Dr. Haynes Dage bill  . Mitral valve regurgitation    trace  . Non-traumatic compression fracture of thoracic vertebra (HCC)    T9  . Stroke [facility])     Past Surgical History:  Procedure Laterality Date  . BACK SURGERY     x 3 Dr Juliene Pina, last @  [facility] ~ 2005, rods, fusion, four surgery  . CARDIAC CATHETERIZATION  1994  . CAROTID ENDARTERECTOMY    . CARPAL TUNNEL RELEASE     left  . CHOLECYSTECTOMY    . ENDARTERECTOMY  04/24/2012   Procedure: ENDARTERECTOMY CAROTID;  Surgeon: Larina Earthly, MD;  Location: [facility] OR;  Service: Vascular;  Laterality: Left;  . HAND SURGERY     bilateral  . HIP FRACTURE SURGERY Left   . HIP PINNING,CANNULATED Left 02/08/2015   Procedure: CANNULATED HIP PINNING;  Surgeon: Kathryne Hitch, MD;  Location: [facility] ORS;  Service: Orthopedics;  Laterality: Left;  . HYSTEROTOMY    . SPINAL CORD STIMULATOR INSERTION    . TEE WITHOUT CARDIOVERSION  04/12/2012   Procedure: TRANSESOPHAGEAL ECHOCARDIOGRAM (TEE);  Surgeon: Lewayne Bunting, MD;  Location: [facility] ENDOSCOPY;  Service: Cardiovascular;  Laterality: N/A;     reports that she has quit smoking. Her smoking use included Cigarettes. She has a 30.00 pack-year smoking history. She has never used smokeless tobacco. She reports that she does not drink alcohol or use drugs.  Allergies  Allergen Reactions  . Percocet [Oxycodone-Acetaminophen] Rash  . Hydrochlorothiazide     Pt does not remember  . Indomethacin     Pt does not remember  . Paroxetine Hcl     Pt does not remember  . Penicillins  Has patient had a PCN reaction causing immediate rash, facial/tongue/throat swelling, SOB or lightheadedness with hypotension: Yes Has patient had a PCN reaction causing severe rash involving mucus membranes or skin necrosis: No Has patient had a PCN reaction that required hospitalization No Has patient had a PCN reaction occurring within the last 10 years: No If all of the above answers are "NO", then may proceed with Cephalosporin use.   . Pravachol     Pt does not remember  . Quinine Derivatives     Pt does not remember  . Wellbutrin [Bupropion]     agitation  . Zocor [Simvastatin - High Dose] Other (See Comments)    Weakness/no energy  . Morphine And  Related Rash    Family History  Problem Relation Age of Onset  . Heart attack Father   . Heart disease Father     before age 77  . Heart disease Sister   . Cancer Sister   . Heart disease Sister     Prior to Admission medications   Medication Sig Start Date End Date Taking? Authorizing Provider  aspirin EC 81 MG tablet Take 81 mg by mouth daily.   Yes Historical Provider, MD  atorvastatin (LIPITOR) 40 MG tablet Take 0.5 tablets (20 mg total) by mouth daily. 01/26/16  Yes Wanda PlumpJose E Paz, MD  diazepam (VALIUM) 5 MG tablet Take 1 tablet (5 mg total) by mouth daily as needed. 03/29/16  Yes Wanda PlumpJose E Paz, MD  gabapentin (NEURONTIN) 600 MG tablet Take 1 tablet (600 mg total) by mouth 4 (four) times daily. 02/11/16  Yes Wanda PlumpJose E Paz, MD  HYDROcodone-acetaminophen (NORCO) 10-325 MG tablet Take 1 tablet by mouth every 6 (six) hours as needed for moderate pain (for pain). 02/09/15  Yes Kathryne Hitchhristopher Y Blackman, MD  metoprolol (LOPRESSOR) 50 MG tablet Take 0.5 tablets (25 mg total) by mouth 2 (two) times daily. 10/07/15  Yes Wanda PlumpJose E Paz, MD  omeprazole (PRILOSEC) 20 MG capsule Take 1 capsule (20 mg total) by mouth daily. 10/06/15  Yes Wanda PlumpJose E Paz, MD  vitamin B-12 (CYANOCOBALAMIN) 1000 MCG tablet Take 1,000 mcg by mouth daily.   Yes Historical Provider, MD  amLODipine (NORVASC) 10 MG tablet TAKE 1 TABLET BY MOUTH EVERY DAY Patient taking differently: TAKE 1/2 TABLET BY MOUTH EVERY DAY 07/01/15   Jake BatheMark C Skains, MD  lidocaine (LIDODERM) 5 % Place 2 patches onto the skin daily. Remove & Discard patch within 12 hours or as directed by MD Patient not taking: Reported on 02/24/2016 02/11/16   Wanda PlumpJose E Paz, MD    Physical Exam:   Constitutional:Elderly female who appears in NAD, calm, comfortable Vitals:   03/30/16 2233 03/31/16 0008 03/31/16 0224 03/31/16 0426  BP:  139/70 116/97 166/60  Pulse:  65 73 74  Resp:  18 18 17   Temp:  98.2 F (36.8 C)    TempSrc:  Oral    SpO2:  90% 91% 94%  Weight: 63.5 kg (140 lb)      Height: 5\' 4"  (1.626 m)      Eyes: PERRL, lids and conjunctivae normal ENMT: Mucous membranes are moist. Posterior pharynx clear of any exudate or lesions.Normal dentition.  Neck: normal, supple, no masses, no thyromegaly Respiratory: clear to auscultation bilaterally, no wheezing, no crackles. Normal respiratory effort. No accessory muscle use.  Cardiovascular: Regular rate and rhythm, no murmurs / rubs / gallops. No extremity edema. 2+ pedal pulses. No carotid bruits.  Abdomen: no tenderness, no masses palpated. No hepatosplenomegaly.  Bowel sounds positive.  Musculoskeletal: no clubbing / cyanosis. Significant pain noted with any movement or manipulation of the right leg. Good ROM, no contractures. Normal muscle tone.  Skin: no rashes, lesions, ulcers. No induration Neurologic: CN 2-12 grossly intact. Sensation intact, DTR normal. Strength 5/5 in all 4.  Psychiatric: Normal judgment and insight. Alert and oriented x 3. Normal mood.     Labs on Admission: I have personally reviewed following labs and imaging studies  CBC:  Recent Labs Lab 03/31/16 0028 03/31/16 0039  WBC 10.9*  --   NEUTROABS 6.9  --   HGB 11.3* 11.6*  HCT 34.5* 34.0*  MCV 93.8  --   PLT 240  --    Basic Metabolic Panel:  Recent Labs Lab 03/31/16 0039  NA 142  K 3.7  CL 105  GLUCOSE 99  BUN 22*  CREATININE 1.10*   GFR: Estimated Creatinine Clearance: 37 mL/min (by C-G formula based on SCr of 1.1 mg/dL (H)). Liver Function Tests: No results for input(s): AST, ALT, ALKPHOS, BILITOT, PROT, ALBUMIN in the last 168 hours. No results for input(s): LIPASE, AMYLASE in the last 168 hours. No results for input(s): AMMONIA in the last 168 hours. Coagulation Profile: No results for input(s): INR, PROTIME in the last 168 hours. Cardiac Enzymes: No results for input(s): CKTOTAL, CKMB, CKMBINDEX, TROPONINI in the last 168 hours. BNP (last 3 results) No results for input(s): PROBNP in the last 8760  hours. HbA1C: No results for input(s): HGBA1C in the last 72 hours. CBG: No results for input(s): GLUCAP in the last 168 hours. Lipid Profile: No results for input(s): CHOL, HDL, LDLCALC, TRIG, CHOLHDL, LDLDIRECT in the last 72 hours. Thyroid Function Tests: No results for input(s): TSH, T4TOTAL, FREET4, T3FREE, THYROIDAB in the last 72 hours. Anemia Panel: No results for input(s): VITAMINB12, FOLATE, FERRITIN, TIBC, IRON, RETICCTPCT in the last 72 hours. Urine analysis:    Component Value Date/Time   COLORURINE DARK YELLOW 06/20/2015 1531   APPEARANCEUR CLEAR 06/20/2015 1531   LABSPEC 1.019 06/20/2015 1531   PHURINE 6.0 06/20/2015 1531   GLUCOSEU NEGATIVE 06/20/2015 1531   GLUCOSEU NEGATIVE 06/03/2015 1616   HGBUR 3+ (A) 06/20/2015 1531   BILIRUBINUR NEGATIVE 06/20/2015 1531   BILIRUBINUR Negative 06/03/2015 1559   KETONESUR NEGATIVE 06/20/2015 1531   PROTEINUR TRACE (A) 06/20/2015 1531   UROBILINOGEN 0.2 06/03/2015 1616   NITRITE NEGATIVE 06/20/2015 1531   LEUKOCYTESUR NEGATIVE 06/20/2015 1531   Sepsis Labs: No results found for this or any previous visit (from the past 240 hour(s)).   Radiological Exams on Admission: Dg Chest 2 View  Result Date: 03/31/2016 CLINICAL DATA:  Fall getting out of the bath tub this evening. Right hip pain. EXAM: CHEST  2 VIEW COMPARISON:  Radiographs 02/11/2016 FINDINGS: Lung volumes are low. Unchanged heart size and mediastinal contours. There is atherosclerosis of the thoracic aorta. No pulmonary edema, pleural fluid, pneumothorax or focal airspace disease. Spinal stimulator in place. Kyphoplasty within midthoracic vertebra. The bones appear under mineralized. No acute osseous abnormality is seen. IMPRESSION: Low lung volumes without evidence of acute abnormality. Electronically Signed   By: Rubye OaksMelanie  Ehinger M.D.   On: 03/31/2016 00:55   Ct Head Wo Contrast  Result Date: 03/31/2016 CLINICAL DATA:  Slipped and fell getting out of bathtub at 1900  hours. RIGHT hip pain. No head injury or loss of consciousness. History of stroke, hypertension and hyperlipidemia. EXAM: CT HEAD WITHOUT CONTRAST CT CERVICAL SPINE WITHOUT CONTRAST TECHNIQUE: Multidetector CT imaging of the  head and cervical spine was performed following the standard protocol without intravenous contrast. Multiplanar CT image reconstructions of the cervical spine were also generated. COMPARISON:  CT HEAD June 04, 2015 FINDINGS: CT HEAD FINDINGS BRAIN: The ventricles and sulci are normal for age. No intraparenchymal hemorrhage, mass effect nor midline shift. Patchy supratentorial white matter hypodensities less than expected for patient's age, though non-specific are most compatible with chronic small vessel ischemic disease. Small area LEFT frontal encephalomalacia. No acute large vascular territory infarcts. No abnormal extra-axial fluid collections. Basal cisterns are patent. VASCULAR: Moderate calcific atherosclerosis of the carotid siphons. SKULL: No skull fracture. 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 No significant scalp soft tissue swelling. SINUSES/ORBITS: The mastoid air-cells and included paranasal sinuses are well-aerated.The included ocular globes and orbital contents are non-suspicious. OTHER: None. CT CERVICAL SPINE FINDINGS ALIGNMENT: Maintained lordosis. Vertebral bodies in alignment. SKULL BASE AND VERTEBRAE: Cervical vertebral bodies and posterior elements are intact. Intervertebral disc heights preserved. No destructive bony lesions. Severe LEFT facet arthropathy. C1-2 articulation maintained. SOFT TISSUES AND SPINAL CANAL: Nonacute. Moderate calcific atherosclerosis RIGHT carotid bulb. Asymmetrically enlarged LEFT Common carotid artery. Aneurysmal thoracic aorta at at least 4 cm, incompletely imaged. Surgical clips LEFT neck. DISC LEVELS: No significant osseous canal stenosis. Moderate LEFT C3-4 neural foraminal narrowing. UPPER CHEST: Apical calcified pleural  plaques. OTHER: None. IMPRESSION: CT HEAD: No acute intracranial process. Old small LEFT frontal lobe infarct, otherwise negative CT HEAD for age. CT CERVICAL SPINE: No acute fracture or malalignment. At least 4 cm aneurysmal thoracic aorta. Recommend CTA chest on a NONEMERGENT basis for further characterization. Electronically Signed   By: Awilda Metro M.D.   On: 03/31/2016 01:14   Ct Cervical Spine Wo Contrast  Result Date: 03/31/2016 CLINICAL DATA:  Slipped and fell getting out of bathtub at 1900 hours. RIGHT hip pain. No head injury or loss of consciousness. History of stroke, hypertension and hyperlipidemia. EXAM: CT HEAD WITHOUT CONTRAST CT CERVICAL SPINE WITHOUT CONTRAST TECHNIQUE: Multidetector CT imaging of the head and cervical spine was performed following the standard protocol without intravenous contrast. Multiplanar CT image reconstructions of the cervical spine were also generated. COMPARISON:  CT HEAD June 04, 2015 FINDINGS: CT HEAD FINDINGS BRAIN: The ventricles and sulci are normal for age. No intraparenchymal hemorrhage, mass effect nor midline shift. Patchy supratentorial white matter hypodensities less than expected for patient's age, though non-specific are most compatible with chronic small vessel ischemic disease. Small area LEFT frontal encephalomalacia. No acute large vascular territory infarcts. No abnormal extra-axial fluid collections. Basal cisterns are patent. VASCULAR: Moderate calcific atherosclerosis of the carotid siphons. SKULL: No skull fracture. 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 No significant scalp soft tissue swelling. SINUSES/ORBITS: The mastoid air-cells and included paranasal sinuses are well-aerated.The included ocular globes and orbital contents are non-suspicious. OTHER: None. CT CERVICAL SPINE FINDINGS ALIGNMENT: Maintained lordosis. Vertebral bodies in alignment. SKULL BASE AND VERTEBRAE: Cervical vertebral bodies and posterior elements  are intact. Intervertebral disc heights preserved. No destructive bony lesions. Severe LEFT facet arthropathy. C1-2 articulation maintained. SOFT TISSUES AND SPINAL CANAL: Nonacute. Moderate calcific atherosclerosis RIGHT carotid bulb. Asymmetrically enlarged LEFT Common carotid artery. Aneurysmal thoracic aorta at at least 4 cm, incompletely imaged. Surgical clips LEFT neck. DISC LEVELS: No significant osseous canal stenosis. Moderate LEFT C3-4 neural foraminal narrowing. UPPER CHEST: Apical calcified pleural plaques. OTHER: None. IMPRESSION: CT HEAD: No acute intracranial process. Old small LEFT frontal lobe infarct, otherwise negative CT HEAD for age. CT CERVICAL SPINE: No acute fracture or malalignment. At  least 4 cm aneurysmal thoracic aorta. Recommend CTA chest on a NONEMERGENT basis for further characterization. Electronically Signed   By: Awilda Metro M.D.   On: 03/31/2016 01:14   Dg Hip Unilat W Or Wo Pelvis 2-3 Views Right  Result Date: 03/31/2016 CLINICAL DATA:  Larey Seat getting out of bathtub this evening. RIGHT hip pain. EXAM: DG HIP (WITH OR WITHOUT PELVIS) 2-3V RIGHT COMPARISON:  None. FINDINGS: Slight cortical irregularity of the femoral neck with faint sclerosis. No dislocation. Status post LEFT femoral neck pinning with intact hardware. PLIF, spinal stimulator battery pack projects in RIGHT abdomen. There is no evidence of arthropathy or other focal bone abnormality. IMPRESSION: Suspected acute nondisplaced RIGHT femoral neck fracture without dislocation. Electronically Signed   By: Awilda Metro M.D.   On: 03/31/2016 00:56      Assessment/Plan Fall 2/2 right femoral neck fracture: Acute. - Admit to MedSurg bed - Hip fracture protocol initiated - Appreciate [employer], will follow-up for further recommendations - NPO except meds for possible surgical procedure - Hydrocodone/Fentanyl IV prn Pain  Leukocytosis: WBC elevated at 10.9. Patient denies any other complaints  at this time.  - Continue to monitor  Chronic back pain   - Continue pain medications as seen above  Essential hypertension - Continue metoprolol and amlodipine  Renal insufficiency: Creatinine slightly elevated above the patient's previous baseline which was noted to be 0.6-0.89. - Continue to monitor  Thoracic aortic aneurysm: Stable. Patient being followed as an outpatient by vascular surgery - Continue outpatient follow-up with vascular surgery  Hyperlipidemia  - Continue atorvastatin   GERD - Continue Protonix  DVT prophylaxis: SCDs for pending surgery Code Status: full Family Communication:   Disposition Plan:   Consults called: Orthopedics Admission status:Inpatient  Clydie Braun MD [facility] Pager [phone]  If 7PM-7AM, please contact night-coverage [email or web address] Password [email or web address]  03/31/2016, 5:50 AM

## 2016-03-31 NOTE — Progress Notes (Signed)
PROGRESS NOTE                                                                                                                                                                                                             Patient Demographics:    Sara [name], is a 75 y.o. female, DOB - 09/26/1940, WUJ:[id number]  Admit date - 03/30/2016   Admitting Physician Sara Braunondell A Smith, MD  Outpatient Primary MD for the patient is Sara OraJose Paz, MD  LOS - 0   Chief Complaint  Patient presents with  . Fall       Brief Narrative   75 y.o. female with medical history significant of HTN, HLD, CAD, chronic pain, and thoracic aortic aneurysm; who presents with right hip fracture status post mechanical fall on the bus stop .   Subjective:    Sara [name] today has, No headache, No chest pain, No abdominal pain - No Nausea, Reports her pain significantly improved after receiving IV pain medicine  Assessment  & Plan :    Principal Problem:   Hip fracture (HCC) Active Problems:   Hypertension   AAA (abdominal aortic aneurysm) without rupture (HCC)   Leukocytosis   Renal insufficiency   Fall 2/2 right femoral neck fracture: Acute. - Hip fracture protocol initiated - [name] for [facility] repair at this evening - Hydrocodone/Fentanyl IV prn Pain  Leukocytosis: WBC elevated at 10.9. Patient denies any other complaints at this time.  - Continue to monitor  Chronic back pain   - Continue pain medications as seen above  Essential hypertension - Continue metoprolol and amlodipine  Renal insufficiency: Creatinine slightly elevated above the patient's previous baseline which was noted to be 0.6-0.89. - Continue to monitor  Thoracic aortic aneurysm: Stable. Patient being followed as an outpatient by vascular surgery - Continue outpatient follow-up with vascular surgery  Hyperlipidemia  - Continue atorvastatin   GERD -  Continue Protonix  Code Status : Full  Family Communication  : Daughter and granddaughters at bedside  Disposition Plan  : Pending PT evaluation   Consults  :  Orthopedic  Procedures  : None  DVT Prophylaxis  : SCDs , Will start on Lovenox tomorrow after surgery  Lab Results  Component Value Date   PLT 240 03/31/2016    Antibiotics  :   Anti-infectives  None        Objective:   Vitals:   03/31/16 0008 03/31/16 0224 03/31/16 0426 03/31/16 0642  BP: 139/70 116/97 166/60 (!) 162/64  Pulse: 65 73 74 67  Resp: 18 18 17 18   Temp: 98.2 F (36.8 C)   97.9 F (36.6 C)  TempSrc: Oral   Oral  SpO2: 90% 91% 94% 99%  Weight:      Height:        Wt Readings from Last 3 Encounters:  03/30/16 63.5 kg (140 lb)  02/24/16 69.5 kg (153 lb 3.2 oz)  02/11/16 68.1 kg (150 lb 2 oz)     Intake/Output Summary (Last 24 hours) at 03/31/16 1424 Last data filed at 03/31/16 0650  Gross per 24 hour  Intake           231.25 ml  Output                0 ml  Net           231.25 ml     Physical Exam  Awake Alert, Oriented X 3, No new F.N deficits, Normal affect [place].AT,PERRAL Supple Neck,No JVD Symmetrical Chest wall movement, Good air movement bilaterally, CTAB RRR,No Gallops,Rubs or new Murmurs, No Parasternal Heave +ve B.Sounds, Abd Soft, No tenderness, No rebound - guarding or rigidity. No Cyanosis, Clubbing or edema, No new Rash or bruise      Data Review:    CBC  Recent Labs Lab 03/31/16 0028 03/31/16 0039  WBC 10.9*  --   HGB 11.3* 11.6*  HCT 34.5* 34.0*  PLT 240  --   MCV 93.8  --   MCH 30.7  --   MCHC 32.8  --   RDW 13.3  --   LYMPHSABS 3.0  --   MONOABS 0.8  --   EOSABS 0.3  --   BASOSABS 0.0  --     Chemistries   Recent Labs Lab 03/31/16 0039  NA 142  K 3.7  CL 105  GLUCOSE 99  BUN 22*  CREATININE 1.10*   ------------------------------------------------------------------------------------------------------------------ No results for  input(s): CHOL, HDL, LDLCALC, TRIG, CHOLHDL, LDLDIRECT in the last 72 hours.  Lab Results  Component Value Date   HGBA1C 6.1 12/12/2015   ------------------------------------------------------------------------------------------------------------------ No results for input(s): TSH, T4TOTAL, T3FREE, THYROIDAB in the last 72 hours.  Invalid input(s): FREET3 ------------------------------------------------------------------------------------------------------------------ No results for input(s): VITAMINB12, FOLATE, FERRITIN, TIBC, IRON, RETICCTPCT in the last 72 hours.  Coagulation profile No results for input(s): INR, PROTIME in the last 168 hours.  No results for input(s): DDIMER in the last 72 hours.  Cardiac Enzymes No results for input(s): CKMB, TROPONINI, MYOGLOBIN in the last 168 hours.  Invalid input(s): CK ------------------------------------------------------------------------------------------------------------------ No results found for: BNP  Inpatient Medications  Scheduled Meds: . amLODipine  5 mg Oral Daily  . atorvastatin  20 mg Oral Daily  . gabapentin  600 mg Oral QID  . metoprolol  25 mg Oral BID  . pantoprazole  40 mg Oral Daily  . vitamin B-12  1,000 mcg Oral Daily   Continuous Infusions: . sodium chloride 100 mL/hr at 03/31/16 1314   PRN Meds:.fentaNYL (SUBLIMAZE) injection, HYDROcodone-acetaminophen  Micro Results No results found for this or any previous visit (from the past 240 hour(s)).  Radiology Reports Dg Chest 2 View  Result Date: 03/31/2016 CLINICAL DATA:  Fall getting out of the bath tub this evening. Right hip pain. EXAM: CHEST  2 VIEW COMPARISON:  Radiographs 02/11/2016 FINDINGS: Lung volumes  are low. Unchanged heart size and mediastinal contours. There is atherosclerosis of the thoracic aorta. No pulmonary edema, pleural fluid, pneumothorax or focal airspace disease. Spinal stimulator in place. Kyphoplasty within midthoracic vertebra. The  bones appear under mineralized. No acute osseous abnormality is seen. IMPRESSION: Low lung volumes without evidence of acute abnormality. Electronically Signed   By: Rubye Oaks M.D.   On: 03/31/2016 00:55   Dg Thoracic Spine W/swimmers  Result Date: 03/12/2016 CLINICAL DATA:  Thoracic spine pain / chronic / worsened x 1-3 months / no trauma / pt. states spinal stenosis EXAM: THORACIC SPINE - 3 VIEWS COMPARISON:  Chest radiograph, 02/11/2016 FINDINGS: Moderate compression fracture of T8 has been treated with vertebroplasty. This is stable. No other fractures.  No spondylolisthesis. Bones are diffusely demineralized. No osteoblastic or osteolytic lesions. There are minor degenerative changes along the mid thoracic spine and lower thoracic spine reflected by loss of disc height and small endplate osteophytes. Spine stimulator leads project posterior to the T7 through T9 vertebra, within the posterior spinal canal, stable. Soft tissues are unremarkable. IMPRESSION: 1. No acute findings. 2. Treated compression fracture of T8, stable from the prior study. No other fractures. Electronically Signed   By: Amie Portland M.D.   On: 03/12/2016 08:36   Ct Head Wo Contrast  Result Date: 03/31/2016 CLINICAL DATA:  Slipped and fell getting out of bathtub at 1900 hours. RIGHT hip pain. No head injury or loss of consciousness. History of stroke, hypertension and hyperlipidemia. EXAM: CT HEAD WITHOUT CONTRAST CT CERVICAL SPINE WITHOUT CONTRAST TECHNIQUE: Multidetector CT imaging of the head and cervical spine was performed following the standard protocol without intravenous contrast. Multiplanar CT image reconstructions of the cervical spine were also generated. COMPARISON:  CT HEAD June 04, 2015 FINDINGS: CT HEAD FINDINGS BRAIN: The ventricles and sulci are normal for age. No intraparenchymal hemorrhage, mass effect nor midline shift. Patchy supratentorial white matter hypodensities less than expected for patient's  age, though non-specific are most compatible with chronic small vessel ischemic disease. Small area LEFT frontal encephalomalacia. No acute large vascular territory infarcts. No abnormal extra-axial fluid collections. Basal cisterns are patent. VASCULAR: Moderate calcific atherosclerosis of the carotid siphons. SKULL: No skull fracture. 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 No significant scalp soft tissue swelling. SINUSES/ORBITS: The mastoid air-cells and included paranasal sinuses are well-aerated.The included ocular globes and orbital contents are non-suspicious. OTHER: None. CT CERVICAL SPINE FINDINGS ALIGNMENT: Maintained lordosis. Vertebral bodies in alignment. SKULL BASE AND VERTEBRAE: Cervical vertebral bodies and posterior elements are intact. Intervertebral disc heights preserved. No destructive bony lesions. Severe LEFT facet arthropathy. C1-2 articulation maintained. SOFT TISSUES AND SPINAL CANAL: Nonacute. Moderate calcific atherosclerosis RIGHT carotid bulb. Asymmetrically enlarged LEFT Common carotid artery. Aneurysmal thoracic aorta at at least 4 cm, incompletely imaged. Surgical clips LEFT neck. DISC LEVELS: No significant osseous canal stenosis. Moderate LEFT C3-4 neural foraminal narrowing. UPPER CHEST: Apical calcified pleural plaques. OTHER: None. IMPRESSION: CT HEAD: No acute intracranial process. Old small LEFT frontal lobe infarct, otherwise negative CT HEAD for age. CT CERVICAL SPINE: No acute fracture or malalignment. At least 4 cm aneurysmal thoracic aorta. Recommend CTA chest on a NONEMERGENT basis for further characterization. Electronically Signed   By: Awilda Metro M.D.   On: 03/31/2016 01:14   Ct Cervical Spine Wo Contrast  Result Date: 03/31/2016 CLINICAL DATA:  Slipped and fell getting out of bathtub at 1900 hours. RIGHT hip pain. No head injury or loss of consciousness. History of stroke, hypertension and hyperlipidemia. EXAM:  CT HEAD WITHOUT CONTRAST CT  CERVICAL SPINE WITHOUT CONTRAST TECHNIQUE: Multidetector CT imaging of the head and cervical spine was performed following the standard protocol without intravenous contrast. Multiplanar CT image reconstructions of the cervical spine were also generated. COMPARISON:  CT HEAD June 04, 2015 FINDINGS: CT HEAD FINDINGS BRAIN: The ventricles and sulci are normal for age. No intraparenchymal hemorrhage, mass effect nor midline shift. Patchy supratentorial white matter hypodensities less than expected for patient's age, though non-specific are most compatible with chronic small vessel ischemic disease. Small area LEFT frontal encephalomalacia. No acute large vascular territory infarcts. No abnormal extra-axial fluid collections. Basal cisterns are patent. VASCULAR: Moderate calcific atherosclerosis of the carotid siphons. SKULL: No skull fracture. 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 No significant scalp soft tissue swelling. SINUSES/ORBITS: The mastoid air-cells and included paranasal sinuses are well-aerated.The included ocular globes and orbital contents are non-suspicious. OTHER: None. CT CERVICAL SPINE FINDINGS ALIGNMENT: Maintained lordosis. Vertebral bodies in alignment. SKULL BASE AND VERTEBRAE: Cervical vertebral bodies and posterior elements are intact. Intervertebral disc heights preserved. No destructive bony lesions. Severe LEFT facet arthropathy. C1-2 articulation maintained. SOFT TISSUES AND SPINAL CANAL: Nonacute. Moderate calcific atherosclerosis RIGHT carotid bulb. Asymmetrically enlarged LEFT Common carotid artery. Aneurysmal thoracic aorta at at least 4 cm, incompletely imaged. Surgical clips LEFT neck. DISC LEVELS: No significant osseous canal stenosis. Moderate LEFT C3-4 neural foraminal narrowing. UPPER CHEST: Apical calcified pleural plaques. OTHER: None. IMPRESSION: CT HEAD: No acute intracranial process. Old small LEFT frontal lobe infarct, otherwise negative CT HEAD for age. CT  CERVICAL SPINE: No acute fracture or malalignment. At least 4 cm aneurysmal thoracic aorta. Recommend CTA chest on a NONEMERGENT basis for further characterization. Electronically Signed   By: Awilda Metroourtnay  Bloomer M.D.   On: 03/31/2016 01:14   Dg Hip Unilat W Or Wo Pelvis 2-3 Views Right  Result Date: 03/31/2016 CLINICAL DATA:  Larey SeatFell getting out of bathtub this evening. RIGHT hip pain. EXAM: DG HIP (WITH OR WITHOUT PELVIS) 2-3V RIGHT COMPARISON:  None. FINDINGS: Slight cortical irregularity of the femoral neck with faint sclerosis. No dislocation. Status post LEFT femoral neck pinning with intact hardware. PLIF, spinal stimulator battery pack projects in RIGHT abdomen. There is no evidence of arthropathy or other focal bone abnormality. IMPRESSION: Suspected acute nondisplaced RIGHT femoral neck fracture without dislocation. Electronically Signed   By: Awilda Metroourtnay  Bloomer M.D.   On: 03/31/2016 00:56     Bexley Laubach M.D on 03/31/2016 at 2:24 PM  Between 7am to 7pm - Pager - [phone]  After 7pm go to [email or web address] - password [email or web address]  [facility] -  Office  [phone]

## 2016-03-31 NOTE — Anesthesia Preprocedure Evaluation (Addendum)
Anesthesia Evaluation  Patient identified by MRN, date of birth, ID band Patient awake    Reviewed: Allergy & Precautions, NPO status , Patient's Chart, lab work & pertinent test results  History of Anesthesia Complications Negative for: history of anesthetic complications  Airway Mallampati: II  TM Distance: >3 FB Neck ROM: Full    Dental  (+) Edentulous Upper, Dental Advisory Given   Pulmonary Current Smoker, former smoker,    breath sounds clear to auscultation       Cardiovascular hypertension, + CAD and + Peripheral Vascular Disease  Normal cardiovascular exam  Mild CAD found on cath in 1994.  Negative stress test.   Neuro/Psych PSYCHIATRIC DISORDERS Anxiety CVA    GI/Hepatic Neg liver ROS, hiatal hernia, GERD  ,  Endo/Other    Renal/GU negative Renal ROS     Musculoskeletal  (+) Arthritis , Osteoarthritis,  Pt has chronic back and leg pain.  Spinal cord stimulator in place.   Abdominal   Peds  Hematology   Anesthesia Other Findings   Reproductive/Obstetrics                            Anesthesia Physical  Anesthesia Plan  ASA: III  Anesthesia Plan: General   Post-op Pain Management:    Induction: Intravenous  Airway Management Planned: Oral ETT  Additional Equipment:   Intra-op Plan:   Post-operative Plan: Extubation in OR  Informed Consent: I have reviewed the patients History and Physical, chart, labs and discussed the procedure including the risks, benefits and alternatives for the proposed anesthesia with the patient or authorized representative who has indicated his/her understanding and acceptance.   Dental advisory given  Plan Discussed with: Anesthesiologist and CRNA  Anesthesia Plan Comments:        Anesthesia Quick Evaluation

## 2016-04-01 ENCOUNTER — Encounter (HOSPITAL_COMMUNITY): Payer: Self-pay | Admitting: Orthopaedic Surgery

## 2016-04-01 DIAGNOSIS — I10 Essential (primary) hypertension: Secondary | ICD-10-CM

## 2016-04-01 LAB — CBC
HEMATOCRIT: 35.3 % — AB (ref 36.0–46.0)
Hemoglobin: 11.5 g/dL — ABNORMAL LOW (ref 12.0–15.0)
MCH: 30.2 pg (ref 26.0–34.0)
MCHC: 32.6 g/dL (ref 30.0–36.0)
MCV: 92.7 fL (ref 78.0–100.0)
Platelets: 223 10*3/uL (ref 150–400)
RBC: 3.81 MIL/uL — ABNORMAL LOW (ref 3.87–5.11)
RDW: 13.2 % (ref 11.5–15.5)
WBC: 6.1 10*3/uL (ref 4.0–10.5)

## 2016-04-01 LAB — BASIC METABOLIC PANEL
Anion gap: 9 (ref 5–15)
BUN: 15 mg/dL (ref 6–20)
CALCIUM: 8.1 mg/dL — AB (ref 8.9–10.3)
CO2: 24 mmol/L (ref 22–32)
CREATININE: 0.84 mg/dL (ref 0.44–1.00)
Chloride: 107 mmol/L (ref 101–111)
GFR calc Af Amer: >60 mL/min (ref >60)
GFR calc non Af Amer: >60 mL/min (ref >60)
GLUCOSE: 230 mg/dL — AB (ref 65–99)
Potassium: 3.8 mmol/L (ref 3.5–5.1)
Sodium: 140 mmol/L (ref 135–145)

## 2016-04-01 MED ORDER — HYDROCODONE-ACETAMINOPHEN 10-325 MG PO TABS: 1.0000 | ORAL_TABLET | ORAL | 0 refills | Status: DC | PRN

## 2016-04-01 MED ORDER — ASPIRIN 325 MG PO TBEC: 325.0000 mg | DELAYED_RELEASE_TABLET | Freq: Every day | ORAL | 0 refills | Status: DC

## 2016-04-01 NOTE — Progress Notes (Signed)
PROGRESS NOTE                                                                                                                                                                                                             Patient Demographics:    Sara [name], is a 75 y.o. female, DOB - 1940/09/30, YNW:[id number]  Admit date - 03/30/2016   Admitting Physician Clydie Braun, MD  Outpatient Primary MD for the patient is [name], MD  LOS - 1   Chief Complaint  Patient presents with  . Fall       Brief Narrative   75 y.o. female with medical history significant of HTN, HLD, CAD, chronic pain, and thoracic aortic aneurysm; who presents with right hip fracture status post mechanical fall on the bathtub.   Subjective:    Kylee Umana today has, No headache, No chest pain, No abdominal pain - No Nausea,Pain is well controlled   Principal Problem:   Hip fracture (HCC) Active Problems:   Hypertension   AAA (abdominal aortic aneurysm) without rupture (HCC)   Leukocytosis   Renal insufficiency   Fall 2/2 right femoral neck fracture: Acute. - Hip fracture protocol initiated - Appreciate [facility], status post surgical repair by Dr. Magnus Ivan 12/6 - Opinion with when necessary pain management, on aspirin for DVT prophylaxis  Chronic back pain   - Continue pain medications as seen above  Essential hypertension - Continue metoprolol and amlodipine  Renal insufficiency:  - Creatinine slightly elevated above the patient's previous baseline which was noted to be 0.6-0.89. - Continue to monitor  Thoracic aortic aneurysm:  - Stable. Patient being followed as an outpatient by vascular surgery - Continue outpatient follow-up with vascular surgery  Hyperlipidemia  - Continue atorvastatin   GERD - Continue Protonix  Code Status : Full  Family Communication  :  granddaughters at bedside  Disposition Plan  : home in 1-2  days.  Consults  :  Orthopedic  Procedures  : Right hip fracture surgically repaired by Dr. Magnus Ivan on 12/6  DVT Prophylaxis  : Aspirin 325 mg daily.  Lab Results  Component Value Date   PLT 223 04/01/2016    Antibiotics  :   Anti-infectives    Start     Dose/Rate Route Frequency Ordered Stop   04/01/16 0600  clindamycin (CLEOCIN)  IVPB 900 mg     900 mg 100 mL/hr over 30 Minutes Intravenous On call to O.R. 03/31/16 1725 03/31/16 1820   04/01/16 0000  clindamycin (CLEOCIN) IVPB 600 mg     600 mg 100 mL/hr over 30 Minutes Intravenous Every 6 hours 03/31/16 2053 04/01/16 0538        Objective:   Vitals:   03/31/16 1949 03/31/16 2004 04/01/16 0518 04/01/16 1034  BP: (!) 150/97 (!) 142/73 (!) 155/72 (!) 131/54  Pulse: 72 71 75 69  Resp: 12  18   Temp: 97.6 F (36.4 C) 97.7 F (36.5 C) 98 F (36.7 C)   TempSrc:  Oral Oral   SpO2: 100% 100% 96%   Weight:      Height:        Wt Readings from Last 3 Encounters:  03/30/16 63.5 kg (140 lb)  02/24/16 69.5 kg (153 lb 3.2 oz)  02/11/16 68.1 kg (150 lb 2 oz)     Intake/Output Summary (Last 24 hours) at 04/01/16 1428 Last data filed at 04/01/16 0519  Gross per 24 hour  Intake             3140 ml  Output               52 ml  Net             3088 ml     Physical Exam  Awake Alert, Oriented X 3, No new F.N deficits, Normal affect [place].AT,PERRAL Supple Neck,No JVD Symmetrical Chest wall movement, Good air movement bilaterally, CTAB RRR,No Gallops,Rubs or new Murmurs, No Parasternal Heave +ve B.Sounds, Abd Soft, No tenderness, No rebound - guarding or rigidity. No Cyanosis, Clubbing or edema, No new Rash or bruise      Data Review:    CBC  Recent Labs Lab 03/31/16 0028 03/31/16 0039 04/01/16 0525  WBC 10.9*  --  6.1  HGB 11.3* 11.6* 11.5*  HCT 34.5* 34.0* 35.3*  PLT 240  --  223  MCV 93.8  --  92.7  MCH 30.7  --  30.2  MCHC 32.8  --  32.6  RDW 13.3  --  13.2  LYMPHSABS 3.0  --   --   MONOABS 0.8  --    --   EOSABS 0.3  --   --   BASOSABS 0.0  --   --     Chemistries   Recent Labs Lab 03/31/16 0039 04/01/16 0525  NA 142 140  K 3.7 3.8  CL 105 107  CO2  --  24  GLUCOSE 99 230*  BUN 22* 15  CREATININE 1.10* 0.84  CALCIUM  --  8.1*   ------------------------------------------------------------------------------------------------------------------ No results for input(s): CHOL, HDL, LDLCALC, TRIG, CHOLHDL, LDLDIRECT in the last 72 hours.  Lab Results  Component Value Date   HGBA1C 6.1 12/12/2015   ------------------------------------------------------------------------------------------------------------------ No results for input(s): TSH, T4TOTAL, T3FREE, THYROIDAB in the last 72 hours.  Invalid input(s): FREET3 ------------------------------------------------------------------------------------------------------------------ No results for input(s): VITAMINB12, FOLATE, FERRITIN, TIBC, IRON, RETICCTPCT in the last 72 hours.  Coagulation profile No results for input(s): INR, PROTIME in the last 168 hours.  No results for input(s): DDIMER in the last 72 hours.  Cardiac Enzymes No results for input(s): CKMB, TROPONINI, MYOGLOBIN in the last 168 hours.  Invalid input(s): CK ------------------------------------------------------------------------------------------------------------------ No results found for: BNP  Inpatient Medications  Scheduled Meds: . amLODipine  5 mg Oral Daily  . aspirin EC  325 mg Oral Q breakfast  . atorvastatin  20 mg Oral Daily  . gabapentin  600 mg Oral QID  . metoprolol  25 mg Oral BID  . pantoprazole  40 mg Oral Daily  . vitamin B-12  1,000 mcg Oral Daily   Continuous Infusions: . sodium chloride 100 mL/hr at 04/01/16 1338  . lactated ringers     PRN Meds:.acetaminophen **OR** acetaminophen, fentaNYL (SUBLIMAZE) injection, HYDROcodone-acetaminophen, HYDROcodone-acetaminophen, menthol-cetylpyridinium **OR** phenol, metoCLOPramide  **OR** metoCLOPramide (REGLAN) injection, morphine injection, ondansetron **OR** ondansetron (ZOFRAN) IV  Micro Results Recent Results (from the past 240 hour(s))  MRSA PCR Screening     Status: None   Collection Time: 03/31/16  2:13 PM  Result Value Ref Range Status   MRSA by PCR NEGATIVE NEGATIVE Final    Comment:        The GeneXpert MRSA Assay (FDA approved for NASAL specimens only), is one component of a comprehensive MRSA colonization surveillance program. It is not intended to diagnose MRSA infection nor to guide or monitor treatment for MRSA infections.     Radiology Reports Dg Chest 2 View  Result Date: 03/31/2016 CLINICAL DATA:  Fall getting out of the bath tub this evening. Right hip pain. EXAM: CHEST  2 VIEW COMPARISON:  Radiographs 02/11/2016 FINDINGS: Lung volumes are low. Unchanged heart size and mediastinal contours. There is atherosclerosis of the thoracic aorta. No pulmonary edema, pleural fluid, pneumothorax or focal airspace disease. Spinal stimulator in place. Kyphoplasty within midthoracic vertebra. The bones appear under mineralized. No acute osseous abnormality is seen. IMPRESSION: Low lung volumes without evidence of acute abnormality. Electronically Signed   By: Rubye OaksMelanie  Ehinger M.D.   On: 03/31/2016 00:55   Dg Thoracic Spine W/swimmers  Result Date: 03/12/2016 CLINICAL DATA:  Thoracic spine pain / chronic / worsened x 1-3 months / no trauma / pt. states spinal stenosis EXAM: THORACIC SPINE - 3 VIEWS COMPARISON:  Chest radiograph, 02/11/2016 FINDINGS: Moderate compression fracture of T8 has been treated with vertebroplasty. This is stable. No other fractures.  No spondylolisthesis. Bones are diffusely demineralized. No osteoblastic or osteolytic lesions. There are minor degenerative changes along the mid thoracic spine and lower thoracic spine reflected by loss of disc height and small endplate osteophytes. Spine stimulator leads project posterior to the T7  through T9 vertebra, within the posterior spinal canal, stable. Soft tissues are unremarkable. IMPRESSION: 1. No acute findings. 2. Treated compression fracture of T8, stable from the prior study. No other fractures. Electronically Signed   By: Amie Portlandavid  Ormond M.D.   On: 03/12/2016 08:36   Ct Head Wo Contrast  Result Date: 03/31/2016 CLINICAL DATA:  Slipped and fell getting out of bathtub at 1900 hours. RIGHT hip pain. No head injury or loss of consciousness. History of stroke, hypertension and hyperlipidemia. EXAM: CT HEAD WITHOUT CONTRAST CT CERVICAL SPINE WITHOUT CONTRAST TECHNIQUE: Multidetector CT imaging of the head and cervical spine was performed following the standard protocol without intravenous contrast. Multiplanar CT image reconstructions of the cervical spine were also generated. COMPARISON:  CT HEAD June 04, 2015 FINDINGS: CT HEAD FINDINGS BRAIN: The ventricles and sulci are normal for age. No intraparenchymal hemorrhage, mass effect nor midline shift. Patchy supratentorial white matter hypodensities less than expected for patient's age, though non-specific are most compatible with chronic small vessel ischemic disease. Small area LEFT frontal encephalomalacia. No acute large vascular territory infarcts. No abnormal extra-axial fluid collections. Basal cisterns are patent. VASCULAR: Moderate calcific atherosclerosis of the carotid siphons. SKULL: No skull fracture. 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 No significant scalp soft  tissue swelling. SINUSES/ORBITS: The mastoid air-cells and included paranasal sinuses are well-aerated.The included ocular globes and orbital contents are non-suspicious. OTHER: None. CT CERVICAL SPINE FINDINGS ALIGNMENT: Maintained lordosis. Vertebral bodies in alignment. SKULL BASE AND VERTEBRAE: Cervical vertebral bodies and posterior elements are intact. Intervertebral disc heights preserved. No destructive bony lesions. Severe LEFT facet arthropathy.  C1-2 articulation maintained. SOFT TISSUES AND SPINAL CANAL: Nonacute. Moderate calcific atherosclerosis RIGHT carotid bulb. Asymmetrically enlarged LEFT Common carotid artery. Aneurysmal thoracic aorta at at least 4 cm, incompletely imaged. Surgical clips LEFT neck. DISC LEVELS: No significant osseous canal stenosis. Moderate LEFT C3-4 neural foraminal narrowing. UPPER CHEST: Apical calcified pleural plaques. OTHER: None. IMPRESSION: CT HEAD: No acute intracranial process. Old small LEFT frontal lobe infarct, otherwise negative CT HEAD for age. CT CERVICAL SPINE: No acute fracture or malalignment. At least 4 cm aneurysmal thoracic aorta. Recommend CTA chest on a NONEMERGENT basis for further characterization. Electronically Signed   By: Awilda Metro M.D.   On: 03/31/2016 01:14   Ct Cervical Spine Wo Contrast  Result Date: 03/31/2016 CLINICAL DATA:  Slipped and fell getting out of bathtub at 1900 hours. RIGHT hip pain. No head injury or loss of consciousness. History of stroke, hypertension and hyperlipidemia. EXAM: CT HEAD WITHOUT CONTRAST CT CERVICAL SPINE WITHOUT CONTRAST TECHNIQUE: Multidetector CT imaging of the head and cervical spine was performed following the standard protocol without intravenous contrast. Multiplanar CT image reconstructions of the cervical spine were also generated. COMPARISON:  CT HEAD June 04, 2015 FINDINGS: CT HEAD FINDINGS BRAIN: The ventricles and sulci are normal for age. No intraparenchymal hemorrhage, mass effect nor midline shift. Patchy supratentorial white matter hypodensities less than expected for patient's age, though non-specific are most compatible with chronic small vessel ischemic disease. Small area LEFT frontal encephalomalacia. No acute large vascular territory infarcts. No abnormal extra-axial fluid collections. Basal cisterns are patent. VASCULAR: Moderate calcific atherosclerosis of the carotid siphons. SKULL: No skull fracture. Moderate to severe  temporomandibular osteoarthrosis. No significant scalp soft tissue swelling. SINUSES/ORBITS: The mastoid air-cells and included paranasal sinuses are well-aerated.The included ocular globes and orbital contents are non-suspicious. OTHER: None. CT CERVICAL SPINE FINDINGS ALIGNMENT: Maintained lordosis. Vertebral bodies in alignment. SKULL BASE AND VERTEBRAE: Cervical vertebral bodies and posterior elements are intact. Intervertebral disc heights preserved. No destructive bony lesions. Severe LEFT facet arthropathy. C1-2 articulation maintained. SOFT TISSUES AND SPINAL CANAL: Nonacute. Moderate calcific atherosclerosis RIGHT carotid bulb. Asymmetrically enlarged LEFT Common carotid artery. Aneurysmal thoracic aorta at at least 4 cm, incompletely imaged. Surgical clips LEFT neck. DISC LEVELS: No significant osseous canal stenosis. Moderate LEFT C3-4 neural foraminal narrowing. UPPER CHEST: Apical calcified pleural plaques. OTHER: None. IMPRESSION: CT HEAD: No acute intracranial process. Old small LEFT frontal lobe infarct, otherwise negative CT HEAD for age. CT CERVICAL SPINE: No acute fracture or malalignment. At least 4 cm aneurysmal thoracic aorta. Recommend CTA chest on a NONEMERGENT basis for further characterization. Electronically Signed   By: Awilda Metro M.D.   On: 03/31/2016 01:14   Pelvis Portable  Result Date: 03/31/2016 CLINICAL DATA:  Postop right hip pain mean EXAM: PORTABLE PELVIS 1-2 VIEWS COMPARISON:  CT from 06/04/2015 FINDINGS: Bilateral cannulated screws are noted along the long axis of both femoral necks, new on the right. No hardware failure. Impacted appearing femoral neck fractures upon both femoral heads. The bony pelvis appears intact. There is pseudoarthrosis of L5 with S1 on the right. Partially visualized lower lumbar fusion hardware. IMPRESSION: Placement of 3 new cannulated screws along  the long axis of the right femoral neck. Impacted femoral neck fractures bilaterally, chronic  on the left. Partially visualized lower lumbar fusion hardware. Electronically Signed   By: Tollie Eth M.D.   On: 03/31/2016 19:27   Dg C-arm 1-60 Min-no Report  Result Date: 03/31/2016 There is no report for this exam.  Dg Hip Operative Unilat With Pelvis Right  Result Date: 03/31/2016 CLINICAL DATA:  Right hip pinning for fracture EXAM: OPERATIVE RIGHT HIP (WITH PELVIS IF PERFORMED) 2 VIEWS TECHNIQUE: Fluoroscopic spot image(s) were submitted for interpretation post-operatively. COMPARISON:  03/30/2016 FINDINGS: Three cannulated screws are seen along the long axis of the right femoral neck with there is an impacted femoral neck fracture on the femoral head. Fine bony detail is limited by the C-arm fluoroscopic technique. A total of 1 minutes of fluoroscopic time was utilized. Four mGy of radiation. IMPRESSION: Pinning of right femoral neck impaction fracture with 3 cannulated screws. Electronically Signed   By: Tollie Eth M.D.   On: 03/31/2016 19:30   Dg Hip Unilat W Or Wo Pelvis 2-3 Views Right  Result Date: 03/31/2016 CLINICAL DATA:  Larey Seat getting out of bathtub this evening. RIGHT hip pain. EXAM: DG HIP (WITH OR WITHOUT PELVIS) 2-3V RIGHT COMPARISON:  None. FINDINGS: Slight cortical irregularity of the femoral neck with faint sclerosis. No dislocation. Status post LEFT femoral neck pinning with intact hardware. PLIF, spinal stimulator battery pack projects in RIGHT abdomen. There is no evidence of arthropathy or other focal bone abnormality. IMPRESSION: Suspected acute nondisplaced RIGHT femoral neck fracture without dislocation. Electronically Signed   By: Awilda Metro M.D.   On: 03/31/2016 00:56     Ahliya Glatt M.D on 04/01/2016 at 2:28 PM  Between 7am to 7pm - Pager - [phone]  After 7pm go to [email or web address] - password [facility]  [facility] -  Office  [phone]

## 2016-04-01 NOTE — Evaluation (Signed)
Physical Therapy Evaluation Patient Details Name: Sara BrickRebecca S [name] MRN: [id number] DOB: 10/19/1940 Today's Date: 04/01/2016   History of Present Illness  pt sustained a fall resulting in R femoral neck fx and she is s/p hip pinnning.  H/O CVA, CAD, HTN and hip fx in 10/16  Clinical Impression  The patient tolerated mobility very well. Plans DC to home. Pt admitted with above diagnosis. Pt currently with functional limitations due to the deficits listed below (see PT Problem List).  Pt will benefit from skilled PT to increase their independence and safety with mobility to allow discharge to the venue listed below.       Follow Up Recommendations Home health PT;Supervision/Assistance - 24 hour    Equipment Recommendations  None recommended by PT    Recommendations for Other Services       Precautions / Restrictions Precautions Precautions: Fall Restrictions Weight Bearing Restrictions: Yes RLE Weight Bearing: Touchdown weight bearing      Mobility  Bed Mobility Overal bed mobility: Needs Assistance Bed Mobility: Supine to Sit     Supine to sit: Min assist     General bed mobility comments: assist for RLE  Transfers Overall transfer level: Needs assistance Equipment used: Rolling walker (2 wheeled) Transfers: Sit to/from [employer]Stand;Stand Pivot Transfers Sit to Stand: Min assist;+2 safety/equipment Stand pivot transfers: Min assist;+2 safety/equipment       General transfer comment: cues for UE/LE placement and weight bearing, frequent checks for WBS as patient placing too much weight. Dr. Magnus IvanBlackman in and ok'd TDWB right leg. Stnad/scoot pivot from bed to recliner to bSC and to recliner.  Ambulation/Gait                Stairs            Wheelchair Mobility    Modified Rankin (Stroke Patients Only)       Balance Overall balance assessment: History of Falls;Needs assistance Sitting-balance support: Feet supported;No upper extremity supported Sitting  balance-Leahy Scale: Good     Standing balance support: During functional activity;Bilateral upper extremity supported Standing balance-Leahy Scale: Poor                               Pertinent Vitals/Pain Pain Assessment: Faces Faces Pain Scale: Hurts a little bit Pain Location: R hip Pain Descriptors / Indicators: Aching Pain Intervention(s): Limited activity within patient's tolerance;Premedicated before session;Repositioned;Ice applied    Home Living Family/patient expects to be discharged to:: Private residence Living Arrangements: Spouse/significant other Available Help at Discharge: Family;Available 24 hours/day   Home Access: Stairs to enter Entrance Stairs-Rails: Right;Left Entrance Stairs-Number of Steps: 1=1=1 Home Layout: One level Home Equipment: Wheelchair - manual      Prior Function Level of Independence: Independent with assistive device(s)         Comments: uses cane inside and RW outside     Hand Dominance        Extremity/Trunk Assessment   Upper Extremity Assessment: Overall WFL for tasks assessed           Lower Extremity Assessment: RLE deficits/detail RLE Deficits / Details: MOVES RIGHT LEG TO EDGE    Cervical / Trunk Assessment: Kyphotic  Communication   Communication: No difficulties  Cognition Arousal/Alertness: Awake/alert Behavior During Therapy: WFL for tasks assessed/performed Overall Cognitive Status: Within Functional Limits for tasks assessed  General Comments      Exercises     Assessment/Plan    PT Assessment Patient needs continued PT services  PT Problem List Decreased strength;Decreased range of motion;Decreased activity tolerance;Decreased mobility;Decreased balance;Decreased safety awareness;Decreased knowledge of use of DME;Decreased knowledge of precautions;Pain          PT Treatment Interventions DME instruction;Gait training;Functional mobility  training;Therapeutic activities;Therapeutic exercise;Patient/family education    PT Goals (Current goals can be found in the Care Plan section)  Acute Rehab PT Goals Patient Stated Goal: home PT Goal Formulation: With patient/family Time For Goal Achievement: 04/08/16 Potential to Achieve Goals: Good    Frequency Min 6X/week   Barriers to discharge        Co-evaluation PT/OT/SLP Co-Evaluation/Treatment: Yes Reason for Co-Treatment: For patient/therapist safety PT goals addressed during session: Mobility/safety with mobility OT goals addressed during session: ADL's and self-care       End of Session Equipment Utilized During Treatment: Gait belt Activity Tolerance: Patient tolerated treatment well Patient left: in chair;with call bell/phone within reach;with family/visitor present Nurse Communication: Mobility status         Time: [id number] PT Time Calculation (min) (ACUTE ONLY): 40 min   Charges:   PT Evaluation $PT Eval Low Complexity: 1 Procedure     PT G CodesRada Hay:        [place] Stanger Elizabeth 04/01/2016, 1:24 PM Blanchard KelchKaren Evertt Chouinard PT [phone]

## 2016-04-01 NOTE — Care Management Note (Signed)
Case Management Note  Patient Details  Name: Sara [name] MRN: [id number] Date of Birth: 1940-08-09  Subjective/Objective:                  CANNULATED HIP PINNING (Right) Action/Plan: Discharge planning Expected Discharge Date:   (unknown)               Expected Discharge Plan:  [place]  In-House Referral:     Discharge planning Services  CM Consult  Post Acute Care Choice:  Home Health Choice offered to:  Patient, Adult Children  DME Arranged:  N/A DME Agency:  NA  HH Arranged:  PT [place] Agency:  [facility] (formerly [employer])  Status of Service:  Completed, signed off  If discussed at [employer], dates discussed:    Additional Comments: CM met with pt and family in room to offer choice of home health agency. Pt and family choose [facility].  Referral given to [facility] rep, Tim.  Pt has both rolling walker and 3n1 at home and has wheelchair and tub bench.  CM has requested orders and face to face.  CM will follow for orders and face to face. Dellie Catholic, RN 04/01/2016, 10:25 AM

## 2016-04-01 NOTE — Progress Notes (Signed)
Subjective: 1 Day Post-Op Procedure(s) (LRB): CANNULATED HIP PINNING (Right) Patient reports pain as moderate.  Therapy at the bedside.  NWB difficult for the patient so I let then know that touch down weight will be fine.  Objective: Vital signs in last 24 hours: Temp:  [97.5 F (36.4 C)-98 F (36.7 C)] 98 F (36.7 C) (12/07 0518) Pulse Rate:  [65-89] 75 (12/07 0518) Resp:  [9-20] 18 (12/07 0518) BP: (134-162)/(57-97) 155/72 (12/07 0518) SpO2:  [94 %-100 %] 96 % (12/07 0518)  Intake/Output from previous day: 12/06 0701 - 12/07 0700 In: 3140 [I.V.:3040; IV Piggyback:100] Out: 52 [Urine:2; Blood:50] Intake/Output this shift: No intake/output data recorded.   Recent Labs  03/31/16 0028 03/31/16 0039 04/01/16 0525  HGB 11.3* 11.6* 11.5*    Recent Labs  03/31/16 0028 03/31/16 0039 04/01/16 0525  WBC 10.9*  --  6.1  RBC 3.68*  --  3.81*  HCT 34.5* 34.0* 35.3*  PLT 240  --  223    Recent Labs  03/31/16 0039 04/01/16 0525  NA 142 140  K 3.7 3.8  CL 105 107  CO2  --  24  BUN 22* 15  CREATININE 1.10* 0.84  GLUCOSE 99 230*  CALCIUM  --  8.1*   No results for input(s): LABPT, INR in the last 72 hours.  She lets me rotate her right hip with minimal discomfort  Assessment/Plan: 1 Day Post-Op Procedure(s) (LRB): CANNULATED HIP PINNING (Right) Up with therapy - touch down weight bearing right hip She would like to go home with home health and family support when stable.  Kathryne HitchChristopher Y Kyleah [place] 04/01/2016, 9:17 AM

## 2016-04-01 NOTE — Discharge Instructions (Signed)
Only touch down weight on right hip until further notice. You can get your right hip incision wet in the shower daily and new dry dressing as needed.

## 2016-04-01 NOTE — Evaluation (Signed)
Occupational Therapy Evaluation Patient Details Name: Sara BrickRebecca S [name] MRN: [id number] DOB: 06/11/1940 Today's Date: 04/01/2016    History of Present Illness pt sustained a fall resulting in R femoral neck fx and she is s/p hip pinnning.  H/O CVA, CAD, HTN and hip fx in 10/16   Clinical Impression   This 75 year old female was admitted for the above.  She is very motivated and will benefit from continued OT in acute setting.  Pt needs reinforcement with TDWB.  Goals are for min guard level in acute setting    Follow Up Recommendations  Supervision/Assistance - 24 hour    Equipment Recommendations  None recommended by OT (has 3:1)    Recommendations for Other Services       Precautions / Restrictions Precautions Precautions: Fall Restrictions Weight Bearing Restrictions: Yes RLE Weight Bearing: Touchdown weight bearing (per Dr Magnus IvanBlackman)      Mobility Bed Mobility Overal bed mobility: Needs Assistance Bed Mobility: Supine to Sit     Supine to sit: Min assist     General bed mobility comments: assist for RLE  Transfers Overall transfer level: Needs assistance Equipment used: Rolling walker (2 wheeled) Transfers: Sit to/from [employer]GI CorporationStand;Stand Pivot Transfers Sit to Stand: Min assist;+2 safety/equipment Stand pivot transfers: Min assist;+2 safety/equipment       General transfer comment: cues for UE/LE placement and weight bearing    Balance                                            ADL Overall ADL's : Needs assistance/impaired     Grooming: Wash/dry hands;Wash/dry face;Oral care;Set up;Sitting   Upper Body Bathing: Set up;Sitting   Lower Body Bathing: Moderate assistance;Sit to/from stand   Upper Body Dressing : Set up;Sitting   Lower Body Dressing: Maximal assistance;Sit to/from stand   Toilet Transfer: Minimal assistance;Stand-pivot;BSC;RW;+2 for safety/equipment   Toileting- Clothing Manipulation and Hygiene: Moderate assistance;Sit  to/from stand         General ADL Comments: pt has difficulty with TDWB--Dr Magnus IvanBlackman came in during eval and upgraded her from NWB.  Family present and will arrange 24/7.  Pt is very independent at baseline.  Educated on heel/toe movement to transfer; this is easier for her than hopping     Vision     Perception     Praxis      Pertinent Vitals/Pain Pain Assessment: Faces Faces Pain Scale: Hurts a little bit Pain Location: R hip Pain Descriptors / Indicators: Aching Pain Intervention(s): Limited activity within patient's tolerance;Monitored during session;Premedicated before session;Repositioned;Ice applied     Hand Dominance     Extremity/Trunk Assessment Upper Extremity Assessment Upper Extremity Assessment: Overall WFL for tasks assessed           Communication Communication Communication: No difficulties   Cognition Arousal/Alertness: Awake/alert Behavior During Therapy: WFL for tasks assessed/performed Overall Cognitive Status: Within Functional Limits for tasks assessed                     General Comments       Exercises       Shoulder Instructions      Home Living Family/patient expects to be discharged to:: Private residence Living Arrangements: Spouse/significant other Available Help at Discharge: Family;Available 24 hours/day               Bathroom Shower/Tub: Walk-in shower  Bathroom Toilet: Standard     Home Equipment: Environmental consultantWalker - 2 wheels;Walker - 4 wheels;Shower seat;Hand held shower head          Prior Functioning/Environment Level of Independence: Independent with assistive device(s)        Comments: uses cane inside and RW outside        OT Problem List: Decreased strength;Decreased activity tolerance;Decreased knowledge of precautions;Pain   OT Treatment/Interventions: Self-care/ADL training;Patient/family education;Therapeutic activities;DME and/or AE instruction    OT Goals(Current goals can be found in the  care plan section) Acute Rehab OT Goals Patient Stated Goal: home OT Goal Formulation: With patient Time For Goal Achievement: 04/08/16 Potential to Achieve Goals: Good ADL Goals Pt Will Transfer to Toilet: with min guard assist;bedside commode;stand pivot transfer Pt Will Perform Toileting - Clothing Manipulation and hygiene: with min guard assist;sit to/from stand Additional ADL Goal #1: pt will verbalize vs use AE for adls with set up Additional ADL Goal #2: pt will maintain TDWB with min cues  OT Frequency: Min 2X/week   Barriers to D/C:            Co-evaluation              End of Session Nurse Communication: Mobility status;Weight bearing status  Activity Tolerance: Patient tolerated treatment well Patient left: in chair;with call bell/phone within reach;with chair alarm set;with family/visitor present   Time: [id number] OT Time Calculation (min): 38 min Charges:  OT General Charges $OT Visit: 1 Procedure OT Evaluation $OT Eval Low Complexity: 1 Procedure G-Codes:    Kaisyn Reinhold 04/01/2016, 10:25 AM Marica OtterMaryellen Marjan Rosman, OTR/L [phone] 04/01/2016

## 2016-04-01 NOTE — Op Note (Signed)
NAMIsabella Bowens:  Bhandari, Esbeidy               ACCOUNT NO.:  [id number]  MEDICAL RECORD NO.:  [id number]  LOCATION:  [facility]                         [facility]  PHYSICIAN:  Vanita PandaChristopher Y. Magnus IvanBlackman, M.D.DATE OF BIRTH:  07-16-1940  DATE OF PROCEDURE:  03/31/2016 DATE OF DISCHARGE:                              OPERATIVE REPORT   PREOPERATIVE DIAGNOSIS:  Nondisplaced right hip femoral neck fracture.  POSTOPERATIVE DIAGNOSIS:  Nondisplaced right hip femoral neck fracture.  PROCEDURE:  Cannulated screw fixation/hip pinning, right hip.  IMPLANTS:  Biomet 6.5 mm partially-threaded cannulated screws x3.  SURGEON:  Vanita PandaChristopher Y. Magnus IvanBlackman, M.D.  ASSISTANT:  None.  ANESTHESIA:  General.  BLOOD LOSS:  50 mL.  ANTIBIOTICS:  900 mg of IV clindamycin.  COMPLICATIONS:  None.  INDICATIONS:  Ms. Cleotis LemaSteed is a 75 year old female, well-known to me.  She sustained a mechanical fall late yesterday evening [facility]  She was found to have a nondisplaced right hip femoral neck fracture.  She actually had a similar injury about a year ago after a mechanical fall and had cannulated screw surgery successfully for that hip and then it went on to heal.  We have recommended this for her right hip due to the nondisplaced nature of the fracture.  She still understands the risks of acute blood loss anemia, and mainly fracture, nonunion given this femoral neck fracture and her age.  She understands this fully and does wish to proceed with surgery. She was admitted to the Medicine Service as well.  PROCEDURE DESCRIPTION:  After informed consent was obtained, appropriate right hip was marked.  She was brought down to the operating room and general anesthesia was obtained while she was on her stretcher.  She was then placed on the fracture table with the perineal post in place and her right leg in in-line skeletal traction, but no traction applied. Her left hip was placed in abduction  stirrup and pillow with flexion and abduction with appropriate padding in the popliteal region.  Under direct fluoroscopy, we then assessed the fracture and it was nondisplaced.  We then prepped the right hip with DuraPrep and sterile drapes.  Time-out was called and she was identified as correct patient and correct right hip.  We then made an incision along the lateral aspect of the hip and dissected down to the proximal femur on the lateral cortex.  I then placed 3 guide pins in an inverted triangle and parallel format.  We did this under direct fluoroscopy and I took measurements of this and for inferior screw, we placed an 8 mm partially- threaded screw and then two 75-mm partially-threaded screws on the anterior-superior and posterior-superior screws.  I then put the hip through range of motion after removing the guide pins in the hip ball and removed it as a unit.  We then irrigated the small wound with normal saline solution, closed the deep tissue with 0-Vicryl followed by 2-0 Vicryl in the subcutaneous tissue, interrupted staples on the skin. Xeroform and Mepilex dressing were applied.  She was then awakened, extubated, and taken to the recovery room in a stable condition.  All final counts were correct.  There  were no complications noted. Postoperatively, we will have her nonweightbearing for 4 to 6 weeks on that right hip.     Vanita Pandahristopher Y. Magnus IvanBlackman, M.D.     CYB/MEDQ  D:  03/31/2016  T:  04/01/2016  Job:  [id number]

## 2016-04-01 NOTE — Progress Notes (Signed)
Pm PT Cancellation Note  Patient Details Name: Sara BrickRebecca S Campise MRN: [id number] DOB: 01/22/1941   Cancelled Treatment:     pt back in bed sleeping, room dark.  Comfortable.  No pm PT session.   Felecia ShellingLori Peter Daquila  PTA [facility]  Acute  Rehab Pager      [phone]

## 2016-04-02 LAB — BASIC METABOLIC PANEL
ANION GAP: 9 (ref 5–15)
BUN: 14 mg/dL (ref 6–20)
CALCIUM: 8.5 mg/dL — AB (ref 8.9–10.3)
CO2: 27 mmol/L (ref 22–32)
Chloride: 107 mmol/L (ref 101–111)
Creatinine, Ser: 0.7 mg/dL (ref 0.44–1.00)
GFR calc Af Amer: >60 mL/min (ref >60)
Glucose, Bld: 119 mg/dL — ABNORMAL HIGH (ref 65–99)
POTASSIUM: 3.3 mmol/L — AB (ref 3.5–5.1)
SODIUM: 143 mmol/L (ref 135–145)

## 2016-04-02 LAB — CBC
HCT: 34.4 % — ABNORMAL LOW (ref 36.0–46.0)
Hemoglobin: 11.1 g/dL — ABNORMAL LOW (ref 12.0–15.0)
MCH: 30 pg (ref 26.0–34.0)
MCHC: 32.3 g/dL (ref 30.0–36.0)
MCV: 93 fL (ref 78.0–100.0)
PLATELETS: 248 10*3/uL (ref 150–400)
RBC: 3.7 MIL/uL — AB (ref 3.87–5.11)
RDW: 13.4 % (ref 11.5–15.5)
WBC: 12 10*3/uL — AB (ref 4.0–10.5)

## 2016-04-02 MED ORDER — ASPIRIN 325 MG PO TBEC: 325.0000 mg | DELAYED_RELEASE_TABLET | Freq: Every day | ORAL | 0 refills | Status: DC

## 2016-04-02 NOTE — Progress Notes (Signed)
Physical Therapy Treatment Patient Details Name: Sara [name] MRN: [id number] DOB: 01/04/1941 Today's Date: 04/02/2016    History of Present Illness pt sustained a fall resulting in R femoral neck fx and she is s/p hip pinnning.  H/O CVA, CAD, HTN and hip fx in 10/16    PT Comments    Pt progressing, much improved with TDWB  Follow Up Recommendations  Home health PT;Supervision/Assistance - 24 hour     Equipment Recommendations  None recommended by PT    Recommendations for Other Services       Precautions / Restrictions Precautions Precautions: Fall Restrictions Weight Bearing Restrictions: Yes RLE Weight Bearing: Touchdown weight bearing    Mobility  Bed Mobility Overal bed mobility: Needs Assistance Bed Mobility: Supine to Sit     Supine to sit: Supervision     General bed mobility comments: incr time, close   Transfers Overall transfer level: Needs assistance Equipment used: Rolling walker (2 wheeled) Transfers: Sit to/from Stand Sit to Stand: Min guard Stand pivot transfers: Min assist       General transfer comment: cues for UE/LEplacement and TDWB--compliance greatly increased  Ambulation/Gait Ambulation/Gait assistance: Min guard Ambulation Distance (Feet): 28 Feet (5' more) Assistive device: Rolling walker (2 wheeled)       General Gait Details: cues for sequence, RW position, TDWB; improved compliance; discussed rationale with pt and husband   Careers information officertairs            Wheelchair Mobility    Modified Rankin (Stroke Patients Only)       Balance   Sitting-balance support: Feet supported;No upper extremity supported         Standing balance-Leahy Scale: Poor                      Cognition Arousal/Alertness: Awake/alert Behavior During Therapy: WFL for tasks assessed/performed Overall Cognitive Status: Within Functional Limits for tasks assessed                      Exercises General Exercises - Lower  Extremity Ankle Circles/Pumps: AROM;Left;15 reps Quad Sets: AROM;Strengthening;Both;15 reps Heel Slides: AROM;Right;10 reps    General Comments        Pertinent Vitals/Pain Pain Assessment: No/denies pain Faces Pain Scale: Hurts little more Pain Location: R hip Pain Descriptors / Indicators: Sore Pain Intervention(s): Limited activity within patient's tolerance;Monitored during session;Premedicated before session;Repositioned;Ice applied    Home Living                      Prior Function            PT Goals (current goals can now be found in the care plan section) Acute Rehab PT Goals Patient Stated Goal: home PT Goal Formulation: With patient/family Time For Goal Achievement: 04/08/16 Potential to Achieve Goals: Good Progress towards PT goals: Progressing toward goals    Frequency    Min 6X/week      PT Plan Current plan remains appropriate    Co-evaluation             End of Session Equipment Utilized During Treatment: Gait belt Activity Tolerance: Patient tolerated treatment well Patient left: in chair;with call bell/phone within reach;with family/visitor present;with chair alarm set     Time: [id number] PT Time Calculation (min) (ACUTE ONLY): 13 min  Charges:  $Gait Training: 8-22 mins  G CodesDrucilla Chalet:      Sara [name] 04/02/2016, 11:22 AM

## 2016-04-02 NOTE — Progress Notes (Signed)
Patient given discharge instructions, and verbalized an understanding of all discharge instructions.  Patient agrees with discharge plan, and is being discharged in stable medical condition.  Patient given transportation via wheelchair. Patient instructed to keep aquacel dressing on until seen by Dr. Magnus IvanBlackman.

## 2016-04-02 NOTE — Discharge Summary (Signed)
Sara [name], is a 75 y.o. female  DOB 06/17/1940  MRN [id number].  Admission date:  03/30/2016  Admitting Physician  Clydie Braunondell A Smith, MD  Discharge Date:  04/02/2016   Primary MD  Willow OraJose Paz, MD  Recommendations for primary care physician for things to follow:  - Patient to follow with orthopedic Dr. Magnus IvanBlackman in 2 weeks from discharge   Admission Diagnosis  Closed fracture of right hip, initial encounter [facility](HCC) [S72.001A]   Discharge Diagnosis  Closed fracture of right hip, initial encounter [facility](HCC) [S72.001A]    Principal Problem:   Hip fracture (HCC) Active Problems:   Hypertension   AAA (abdominal aortic aneurysm) without rupture (HCC)   Leukocytosis   Renal insufficiency      Past Medical History:  Diagnosis Date  . Anxiety   . Arthritis   . Bronchitis    hx of  . Carotid artery occlusion   . Chronic pain syndrome    pain management  . Coronary artery disease    sees Dr. Patty SermonsBrackbill  . GERD (gastroesophageal reflux disease)    hx of  . H/O hiatal hernia   . Hypercholesteremia   . Hypertension    all managed by Dr. Haynes DageBrack bill  . Mitral valve regurgitation    trace  . Non-traumatic compression fracture of thoracic vertebra (HCC)    T9  . Stroke [facility](HCC)     Past Surgical History:  Procedure Laterality Date  . BACK SURGERY     x 3 Dr Juliene PinaGeoffrey, [facility], rods, fusion, four surgery  . CARDIAC CATHETERIZATION  1994  . CAROTID ENDARTERECTOMY    . CARPAL TUNNEL RELEASE     left  . CHOLECYSTECTOMY    . ENDARTERECTOMY  04/24/2012   Procedure: ENDARTERECTOMY CAROTID;  Surgeon: Larina Earthlyodd F Early, MD;  [facility];  Service: Vascular;  Laterality: Left;  . HAND SURGERY     bilateral  . HIP FRACTURE SURGERY Left   . HIP PINNING,CANNULATED Left 02/08/2015   Procedure: CANNULATED HIP PINNING;  Surgeon: Kathryne Hitchhristopher Y Blackman, MD;  Location: [facility] ORS;  Service: Orthopedics;   Laterality: Left;  . HIP PINNING,CANNULATED Right 03/31/2016   Procedure: CANNULATED HIP PINNING;  Surgeon: Kathryne Hitchhristopher Y Blackman, MD;  Location: [facility] ORS;  Service: Orthopedics;  Laterality: Right;  . HYSTEROTOMY    . SPINAL CORD STIMULATOR INSERTION    . TEE WITHOUT CARDIOVERSION  04/12/2012   Procedure: TRANSESOPHAGEAL ECHOCARDIOGRAM (TEE);  Surgeon: Lewayne BuntingBrian S Crenshaw, MD;  [facility];  Service: Cardiovascular;  Laterality: N/A;       History of present illness and  Hospital Course:     Kindly see H&P for history of present illness and admission details, please review complete Labs, Consult reports and Test reports for all details in brief  HPI  from the history and physical done on the day of admission 03/31/2016 HPI: Sara BrickRebecca S Burlingame is a 75 y.o. female with medical history significant of HTN, HLD, CAD, chronic pain, and thoracic aortic aneurysm; who  presents after having a fall at home complaining of right hip pain. Patient notes that she was getting out of the bathtub around 7:00 PM when she slipped on the floor and landed on her right side. Denies having any loss of consciousness or trauma to her head. Thereafter she notes immediate onset of right hip pain radiating into her groin. Pain was worsened with any movement of the affected leg. She denies having any dysuria, shortness of breath, chest pain, fever, or chills. Patient also reports that she's been daily with a compound fracture of her back and had broken her left hip one year ago, and that was fixed by Dr. Magnus IvanBlackman.  ED Course: Upon admission to the emergency department patient was evaluated and seen have an acute right femoral neck fracture. Laboratory revealed WBC of 10.9, BUN 22, and creatinine 1.10. Dr. Otelia SergeantNitka of [facility] was consulted and will see patient in a.m.   Hospital Course  75 y.o.femalewith medical history significant of HTN, HLD, CAD, chronic pain, andthoracic aortic aneurysm; who presents  with right hip fracture status post mechanical fall on the bathtub.  Fall 2/2right femoral neck fracture: Acute. - Hip fracture protocol initiated - [facility]iate [facility], status post surgical repair by Dr. Magnus IvanBlackman 12/6 - Opinion with when necessary pain management, on aspirin 324 mg for DVT prophylaxis for next 30 days, thereafter back on aspirin 81 mg oral daily  Chronic back pain  - Continue pain medications as seen above  Essential hypertension - Continue metoprolol and amlodipine  Renal insufficiency:  - Creatinine slightly elevated above the patient's previous baseline which was noted to be 0.6-0.89. - Continue to monitor  Thoracic aortic aneurysm:  - Stable. Patient being followed as an outpatient by vascular surgery - Continue outpatient follow-up with vascular surgery  Hyperlipidemia  - Continue atorvastatin   GERD - Continue Protonix    Discharge Condition:  Stable   Follow UP  Follow-up Information    Kathryne Hitchhristopher Y Blackman, MD. Schedule an appointment as soon as possible for a visit in 2 week(s).   Specialty:  Orthopedic Surgery Contact information: [address] [place] [address] [phone]        [facility] Follow up.   Specialty:  Home Health Services Why:  home health physical therapy Contact information: [address] [place] [address] [phone]             Discharge Instructions  and  Discharge Medications     Discharge Instructions    Diet - low sodium heart healthy    Complete by:  As directed    Discharge instructions    Complete by:  As directed    Follow with Primary MD Willow OraJose Paz, MD in 7 days   Get CBC, CMP,  checked  by Primary MD next visit.    Activity: per PT and ortho   Disposition Home    Diet: Heart Healthy  , with feeding assistance and aspiration precautions.  For Heart failure patients - Check your Weight same time everyday, if you gain over 2  pounds, or you develop in leg swelling, experience more shortness of breath or chest pain, call your Primary MD immediately. Follow Cardiac Low Salt Diet and 1.5 lit/day fluid restriction.   On your next visit with your primary care physician please Get Medicines reviewed and adjusted.   Please request your Prim.MD to go over all Hospital Tests and Procedure/Radiological results at the follow up, please get all  Hospital records sent to your Prim MD by signing hospital release before you go home.   If you experience worsening of your admission symptoms, develop shortness of breath, life threatening emergency, suicidal or homicidal thoughts you must seek medical attention immediately by calling 911 or calling your MD immediately  if symptoms less severe.  You Must read complete instructions/literature along with all the possible adverse reactions/side effects for all the Medicines you take and that have been prescribed to you. Take any new Medicines after you have completely understood and accpet all the possible adverse reactions/side effects.   Do not drive, operating heavy machinery, perform activities at heights, swimming or participation in water activities or provide baby sitting services if your were admitted for syncope or siezures until you have seen by Primary MD or a Neurologist and advised to do so again.  Do not drive when taking Pain medications.    Do not take more than prescribed Pain, Sleep and Anxiety Medications  Special Instructions: If you have smoked or chewed Tobacco  in the last 2 yrs please stop smoking, stop any regular Alcohol  and or any Recreational drug use.  Wear Seat belts while driving.   Please note  You were cared for by a hospitalist during your hospital stay. If you have any questions about your discharge medications or the care you received while you were in the hospital after you are discharged, you can call the unit and asked to speak with the  hospitalist on call if the hospitalist that took care of you is not available. Once you are discharged, your primary care physician will handle any further medical issues. Please note that NO REFILLS for any discharge medications will be authorized once you are discharged, as it is imperative that you return to your primary care physician (or establish a relationship with a primary care physician if you do not have one) for your aftercare needs so that they can reassess your need for medications and monitor your lab values.   Increase activity slowly    Complete by:  As directed    Touch down weight bearing    Complete by:  As directed    Laterality:  right   Extremity:  Lower       Medication List    STOP taking these medications   lidocaine 5 % Commonly known as:  LIDODERM     TAKE these medications   amLODipine 10 MG tablet Commonly known as:  NORVASC TAKE 1 TABLET BY MOUTH EVERY DAY What changed:  See the new instructions.   aspirin 325 MG EC tablet Take 1 tablet (325 mg total) by mouth daily with breakfast. Please take for total of 30 days, then change back to aspirin 81 mg oral daily after 30 days What changed:  medication strength  how much to take  when to take this  additional instructions   atorvastatin 40 MG tablet Commonly known as:  LIPITOR Take 0.5 tablets (20 mg total) by mouth daily.   diazepam 5 MG tablet Commonly known as:  VALIUM Take 1 tablet (5 mg total) by mouth daily as needed.   gabapentin 600 MG tablet Commonly known as:  NEURONTIN Take 1 tablet (600 mg total) by mouth 4 (four) times daily.   HYDROcodone-acetaminophen 10-325 MG tablet Commonly known as:  NORCO Take 1 tablet by mouth every 4 (four) hours as needed for moderate pain (for pain). What changed:  when to take this   metoprolol 50 MG  tablet Commonly known as:  LOPRESSOR Take 0.5 tablets (25 mg total) by mouth 2 (two) times daily.   omeprazole 20 MG capsule Commonly known as:   PRILOSEC Take 1 capsule (20 mg total) by mouth daily.   vitamin B-12 1000 MCG tablet Commonly known as:  CYANOCOBALAMIN Take 1,000 mcg by mouth daily.         Diet and Activity recommendation: See Discharge Instructions above   Consults obtained -  Orthopedic Dr. Magnus Ivan   Major procedures and Radiology Reports - PLEASE review detailed and final reports for all details, in brief -      Dg Chest 2 View  Result Date: 03/31/2016 CLINICAL DATA:  Fall getting out of the bath tub this evening. Right hip pain. EXAM: CHEST  2 VIEW COMPARISON:  Radiographs 02/11/2016 FINDINGS: Lung volumes are low. Unchanged heart size and mediastinal contours. There is atherosclerosis of the thoracic aorta. No pulmonary edema, pleural fluid, pneumothorax or focal airspace disease. Spinal stimulator in place. Kyphoplasty within midthoracic vertebra. The bones appear under mineralized. No acute osseous abnormality is seen. IMPRESSION: Low lung volumes without evidence of acute abnormality. Electronically Signed   By: Rubye Oaks M.D.   On: 03/31/2016 00:55   Dg Thoracic Spine W/swimmers  Result Date: 03/12/2016 CLINICAL DATA:  Thoracic spine pain / chronic / worsened x 1-3 months / no trauma / pt. states spinal stenosis EXAM: THORACIC SPINE - 3 VIEWS COMPARISON:  Chest radiograph, 02/11/2016 FINDINGS: Moderate compression fracture of T8 has been treated with vertebroplasty. This is stable. No other fractures.  No spondylolisthesis. Bones are diffusely demineralized. No osteoblastic or osteolytic lesions. There are minor degenerative changes along the mid thoracic spine and lower thoracic spine reflected by loss of disc height and small endplate osteophytes. Spine stimulator leads project posterior to the T7 through T9 vertebra, within the posterior spinal canal, stable. Soft tissues are unremarkable. IMPRESSION: 1. No acute findings. 2. Treated compression fracture of T8, stable from the prior study. No  other fractures. Electronically Signed   By: Amie Portland M.D.   On: 03/12/2016 08:36   Ct Head Wo Contrast  Result Date: 03/31/2016 CLINICAL DATA:  Slipped and fell getting out of bathtub at 1900 hours. RIGHT hip pain. No head injury or loss of consciousness. History of stroke, hypertension and hyperlipidemia. EXAM: CT HEAD WITHOUT CONTRAST CT CERVICAL SPINE WITHOUT CONTRAST TECHNIQUE: Multidetector CT imaging of the head and cervical spine was performed following the standard protocol without intravenous contrast. Multiplanar CT image reconstructions of the cervical spine were also generated. COMPARISON:  CT HEAD June 04, 2015 FINDINGS: CT HEAD FINDINGS BRAIN: The ventricles and sulci are normal for age. No intraparenchymal hemorrhage, mass effect nor midline shift. Patchy supratentorial white matter hypodensities less than expected for patient's age, though non-specific are most compatible with chronic small vessel ischemic disease. Small area LEFT frontal encephalomalacia. No acute large vascular territory infarcts. No abnormal extra-axial fluid collections. Basal cisterns are patent. VASCULAR: Moderate calcific atherosclerosis of the carotid siphons. SKULL: No skull fracture. 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 No significant scalp soft tissue swelling. SINUSES/ORBITS: The mastoid air-cells and included paranasal sinuses are well-aerated.The included ocular globes and orbital contents are non-suspicious. OTHER: None. CT CERVICAL SPINE FINDINGS ALIGNMENT: Maintained lordosis. Vertebral bodies in alignment. SKULL BASE AND VERTEBRAE: Cervical vertebral bodies and posterior elements are intact. Intervertebral disc heights preserved. No destructive bony lesions. Severe LEFT facet arthropathy. C1-2 articulation maintained. SOFT TISSUES AND SPINAL CANAL: Nonacute. Moderate calcific atherosclerosis RIGHT carotid  bulb. Asymmetrically enlarged LEFT Common carotid artery. 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 Surgical clips LEFT neck. DISC LEVELS: No significant osseous canal stenosis. Moderate LEFT C3-4 neural foraminal narrowing. UPPER CHEST: Apical calcified pleural plaques. OTHER: None. IMPRESSION: CT HEAD: No acute intracranial process. Old small LEFT frontal lobe infarct, otherwise negative CT HEAD for age. CT CERVICAL SPINE: No acute fracture or malalignment. At least 4 cm aneurysmal thoracic aorta. Recommend CTA chest on a NONEMERGENT basis for further characterization. Electronically Signed   By: Awilda Metro M.D.   On: 03/31/2016 01:14   Ct Cervical Spine Wo Contrast  Result Date: 03/31/2016 CLINICAL DATA:  Slipped and fell getting out of bathtub at 1900 hours. RIGHT hip pain. No head injury or loss of consciousness. History of stroke, hypertension and hyperlipidemia. EXAM: CT HEAD WITHOUT CONTRAST CT CERVICAL SPINE WITHOUT CONTRAST TECHNIQUE: Multidetector CT imaging of the head and cervical spine was performed following the standard protocol without intravenous contrast. Multiplanar CT image reconstructions of the cervical spine were also generated. COMPARISON:  CT HEAD June 04, 2015 FINDINGS: CT HEAD FINDINGS BRAIN: The ventricles and sulci are normal for age. No intraparenchymal hemorrhage, mass effect nor midline shift. Patchy supratentorial white matter hypodensities less than expected for patient's age, though non-specific are most compatible with chronic small vessel ischemic disease. Small area LEFT frontal encephalomalacia. No acute large vascular territory infarcts. No abnormal extra-axial fluid collections. Basal cisterns are patent. VASCULAR: Moderate calcific atherosclerosis of the carotid siphons. SKULL: No skull fracture. 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 No significant scalp soft tissue swelling. SINUSES/ORBITS: The mastoid air-cells and included paranasal sinuses are well-aerated.The included ocular globes and orbital  contents are non-suspicious. OTHER: None. CT CERVICAL SPINE FINDINGS ALIGNMENT: Maintained lordosis. Vertebral bodies in alignment. SKULL BASE AND VERTEBRAE: Cervical vertebral bodies and posterior elements are intact. Intervertebral disc heights preserved. No destructive bony lesions. Severe LEFT facet arthropathy. C1-2 articulation maintained. SOFT TISSUES AND SPINAL CANAL: Nonacute. Moderate calcific atherosclerosis RIGHT carotid bulb. Asymmetrically enlarged LEFT Common carotid artery. Aneurysmal thoracic aorta at at least 4 cm, incompletely imaged. Surgical clips LEFT neck. DISC LEVELS: No significant osseous canal stenosis. Moderate LEFT C3-4 neural foraminal narrowing. UPPER CHEST: Apical calcified pleural plaques. OTHER: None. IMPRESSION: CT HEAD: No acute intracranial process. Old small LEFT frontal lobe infarct, otherwise negative CT HEAD for age. CT CERVICAL SPINE: No acute fracture or malalignment. At least 4 cm aneurysmal thoracic aorta. Recommend CTA chest on a NONEMERGENT basis for further characterization. Electronically Signed   By: Awilda Metro M.D.   On: 03/31/2016 01:14   Pelvis Portable  Result Date: 03/31/2016 CLINICAL DATA:  Postop right hip pain mean EXAM: PORTABLE PELVIS 1-2 VIEWS COMPARISON:  CT from 06/04/2015 FINDINGS: Bilateral cannulated screws are noted along the long axis of both femoral necks, new on the right. No hardware failure. Impacted appearing femoral neck fractures upon both femoral heads. The bony pelvis appears intact. There is pseudoarthrosis of L5 with S1 on the right. Partially visualized lower lumbar fusion hardware. IMPRESSION: Placement of 3 new cannulated screws along the long axis of the right femoral neck. Impacted femoral neck fractures bilaterally, chronic on the left. Partially visualized lower lumbar fusion hardware. Electronically Signed   By: Tollie Eth M.D.   On: 03/31/2016 19:27   Dg C-arm 1-60 Min-no Report  Result Date: 03/31/2016 There is  no report for this exam.  Dg Hip Operative Unilat With Pelvis Right  Result Date: 03/31/2016 CLINICAL DATA:  Right hip pinning for fracture EXAM: OPERATIVE RIGHT HIP (WITH PELVIS IF PERFORMED) 2 VIEWS TECHNIQUE: Fluoroscopic spot image(s) were submitted for interpretation post-operatively. COMPARISON:  03/30/2016 FINDINGS: Three cannulated screws are seen along the long axis of the right femoral neck with there is an impacted femoral neck fracture on the femoral head. Fine bony detail is limited by the C-arm fluoroscopic technique. A total of 1 minutes of fluoroscopic time was utilized. Four mGy of radiation. IMPRESSION: Pinning of right femoral neck impaction fracture with 3 cannulated screws. Electronically Signed   By: Tollie Eth M.D.   On: 03/31/2016 19:30   Dg Hip Unilat W Or Wo Pelvis 2-3 Views Right  Result Date: 03/31/2016 CLINICAL DATA:  Larey Seat getting out of bathtub this evening. RIGHT hip pain. EXAM: DG HIP (WITH OR WITHOUT PELVIS) 2-3V RIGHT COMPARISON:  None. FINDINGS: Slight cortical irregularity of the femoral neck with faint sclerosis. No dislocation. Status post LEFT femoral neck pinning with intact hardware. PLIF, spinal stimulator battery pack projects in RIGHT abdomen. There is no evidence of arthropathy or other focal bone abnormality. IMPRESSION: Suspected acute nondisplaced RIGHT femoral neck fracture without dislocation. Electronically Signed   By: Awilda Metro M.D.   On: 03/31/2016 00:56    Micro Results     Recent Results (from the past 240 hour(s))  MRSA PCR Screening     Status: None   Collection Time: 03/31/16  2:13 PM  Result Value Ref Range Status   MRSA by PCR NEGATIVE NEGATIVE Final    Comment:        The GeneXpert MRSA Assay (FDA approved for NASAL specimens only), is one component of a comprehensive MRSA colonization surveillance program. It is not intended to diagnose MRSA infection nor to guide or monitor treatment for MRSA infections.         Today   Subjective:   Talayah Picardi today has no headache,no chest  Or abdominal pain,Ports were hip pain is controlled, feels much better wants to go home today.   Objective:   Blood pressure (!) 119/96, pulse 71, temperature 98.3 F (36.8 C), temperature source Oral, resp. rate 18, height 5\' 4"  (1.626 m), weight 63.5 kg (140 lb), SpO2 94 %.   Intake/Output Summary (Last 24 hours) at 04/02/16 1227 Last data filed at 04/02/16 0930  Gross per 24 hour  Intake          1106.67 ml  Output                0 ml  Net          1106.67 ml    Exam Awake Alert, Oriented x 3, No new F.N deficits, Normal affect [place].AT,PERRAL Supple Neck,No JVD,   Symmetrical Chest wall movement, Good air movement bilaterally, CTAB RRR,No Gallops,Rubs or new Murmurs, No Parasternal Heave +ve B.Sounds, Abd Soft, Non tender, No organomegaly appriciated, No rebound -guarding or rigidity. No Cyanosis, Clubbing or edema, No new Rash or bruise  Data Review   CBC w Diff: Lab Results  Component Value Date   WBC 12.0 (H) 04/02/2016   HGB 11.1 (L) 04/02/2016   HCT 34.4 (L) 04/02/2016   PLT 248 04/02/2016   LYMPHOPCT 27 03/31/2016   MONOPCT 7 03/31/2016   EOSPCT 2 03/31/2016   BASOPCT 0 03/31/2016    CMP: Lab Results  Component Value Date   NA 143 04/02/2016   K 3.3 (L) 04/02/2016   CL 107 04/02/2016   CO2 27 04/02/2016   BUN 14  04/02/2016   CREATININE 0.70 04/02/2016   CREATININE 0.79 06/20/2015   PROT 4.9 (L) 06/06/2015   ALBUMIN 2.3 (L) 06/06/2015   BILITOT 0.4 06/06/2015   ALKPHOS 132 (H) 06/06/2015   AST 12 12/12/2015   ALT 13 12/12/2015  .   Total Time in preparing paper work, data evaluation and todays exam - 35 minutes  ELGERGAWY, DAWOOD M.D on 04/02/2016 at 12:27 PM  [facility]   Office  [phone]

## 2016-04-02 NOTE — Progress Notes (Signed)
Occupational Therapy Treatment Patient Details Name: Sara [name] MRN: [id number] DOB: 01/16/1941 Today's Date: 04/02/2016    History of present illness pt sustained a fall resulting in R femoral neck fx and she is s/p hip pinnning.  H/O CVA, CAD, HTN and hip fx in 10/16   OT comments  Pt is making good progress with OT, following TDWB. Didn't feel great this am  Follow Up Recommendations  Supervision/Assistance - 24 hour    Equipment Recommendations  None recommended by OT    Recommendations for Other Services      Precautions / Restrictions Precautions Precautions: Fall Restrictions Weight Bearing Restrictions: Yes RLE Weight Bearing: Touchdown weight bearing       Mobility Bed Mobility         Supine to sit: Min assist     General bed mobility comments: assist for RLE  Transfers   Equipment used: Rolling walker (2 wheeled)   Sit to Stand: Min assist Stand pivot transfers: Min assist       General transfer comment: cues for UE/LEplacement and TDWB--compliance greatly increased    Balance                                   ADL                           Toilet Transfer: Minimal assistance;Stand-pivot;BSC;RW   Toileting- Clothing Manipulation and Hygiene: Minimal assistance;Sit to/from stand         General ADL Comments: Pt doing much better with TDWB today. Pt has a reacher at home:  Reminded her of ADL uses.  She also has 24/7 assistance. She didn't sleep well and didn't feel great this am. Wanted to return to bed after using commode      Vision                     Perception     Praxis      Cognition   Behavior During Therapy: [facility] for tasks assessed/performed Overall Cognitive Status: Within Functional Limits for tasks assessed                       Extremity/Trunk Assessment               Exercises     Shoulder Instructions       General Comments      Pertinent Vitals/ Pain        Pain Assessment: Faces Faces Pain Scale: Hurts little more Pain Location: R hip Pain Descriptors / Indicators: Sore Pain Intervention(s): Limited activity within patient's tolerance;Monitored during session;Premedicated before session;Repositioned;Ice applied  Home Living                                          Prior Functioning/Environment              Frequency           Progress Toward Goals  OT Goals(current goals can now be found in the care plan section)  Progress towards OT goals: Progressing toward goals     Plan      Co-evaluation                 End of Session  Activity Tolerance Patient tolerated treatment well   Patient Left in bed;with call bell/phone within reach;with bed alarm set;with family/visitor present   Nurse Communication          Time: [id number] OT Time Calculation (min): 15 min  Charges: OT General Charges $OT Visit: 1 Procedure OT Treatments $Self Care/Home Management : 8-22 mins  Tarick Parenteau 04/02/2016, 10:03 AM  Sara [name], OTR/L [phone] 04/02/2016

## 2016-04-02 NOTE — Progress Notes (Signed)
   04/02/16 1123  PT Visit Information  Last PT Received On 04/02/16  Assistance Needed +1  History of Present Illness pt sustained a fall resulting in R femoral neck fx and she is s/p hip pinnning.  H/O CVA, CAD, HTN and hip fx in 10/16  Subjective Data  Patient Stated Goal home  Precautions  Precautions Fall  Restrictions  RLE Weight Bearing TWB  Pain Assessment  Pain Assessment No/denies pain  Cognition  Arousal/Alertness Awake/alert  Behavior During Therapy WFL for tasks assessed/performed  Overall Cognitive Status Within Functional Limits for tasks assessed  Bed Mobility  General bed mobility comments (in chair)  Transfers  Overall transfer level Needs assistance  Equipment used Rolling walker (2 wheeled)  Transfers Sit to/from Stand  Sit to Stand Supervision  General transfer comment cues for UE/LEplacement and TDWB  Ambulation/Gait  Ambulation/Gait assistance Min guard;Supervision  Ambulation Distance (Feet) 6 Feet  Assistive device Rolling walker (2 wheeled)  General Gait Details cues for sequence, RW position, TDWB; improved compliance; discussed rationale with pt and husband  Stairs Yes  Stairs assistance Min assist  Stair Management No rails;Step to pattern;Backwards;With walker  Number of Stairs 1 (x2)  General stair comments cues for sequence adn technique, TDWB; husband and pt able to return demo  PT - End of Session  Equipment Utilized During Treatment Gait belt  Activity Tolerance Patient tolerated treatment well  Patient left in chair;with call bell/phone within reach;with family/visitor present;with chair alarm set  Nurse Communication Mobility status  PT - Assessment/Plan  PT Plan Current plan remains appropriate  PT Frequency (ACUTE ONLY) Min 6X/week  Follow Up Recommendations Home health PT;Supervision/Assistance - 24 hour  PT equipment None recommended by PT  PT Goal Progression  Progress towards PT goals Progressing toward goals  Acute Rehab PT  Goals  PT Goal Formulation With patient/family  Time For Goal Achievement 04/08/16  Potential to Achieve Goals Good  PT Time Calculation  PT Start Time (ACUTE ONLY) 1025  PT Stop Time (ACUTE ONLY) 1035  PT Time Calculation (min) (ACUTE ONLY) 10 min

## 2016-04-05 ENCOUNTER — Telehealth: Payer: Self-pay | Admitting: Internal Medicine

## 2016-04-05 NOTE — Telephone Encounter (Signed)
Joni ReiningNicole with [facility] called to inform Dr. Drue NovelPaz that the patient was recently discharged from the hospital for right hip pinning. They will be starting PT with her today.   Joni Reiningicole phone: [phone]

## 2016-04-05 NOTE — Telephone Encounter (Signed)
FYI

## 2016-04-05 NOTE — Telephone Encounter (Signed)
noted 

## 2016-04-06 ENCOUNTER — Telehealth: Payer: Self-pay

## 2016-04-06 ENCOUNTER — Telehealth (INDEPENDENT_AMBULATORY_CARE_PROVIDER_SITE_OTHER): Payer: Self-pay | Admitting: Orthopaedic Surgery

## 2016-04-06 NOTE — Telephone Encounter (Signed)
Called and left voicemail  Giving verbal okay to [place] with [facility] for patient to have PT 3x week for 3 weeks.

## 2016-04-06 NOTE — Telephone Encounter (Signed)
Transition Care Management Follow-up Telephone Call  ADMISSION DATE: 03/30/16  DISCHARGE DATE: 04/02/16   How have you been since you were released from the hospital? Patient state she is doing ok. States she has family care around the clock.    Do you understand why you were in the hospital? YES   Do you understand the discharge instrcutions? Yes  Items Reviewed:  Medications reviewed:  Reviewed  Allergies reviewed: Reviewed  Dietary changes reviewed:Low Sodium Heart Healthy  Referrals reviewed: Dr. Lanny CrampBlackman [facility] and PCP Dr. Drue NovelPaz.  Patient started Home Health Services on yesterday. Will be seen 3 times weekly.    Functional Questionnaire:   Activities of Daily Living (ADLs):  Patient needs assistance with ADL's at this time.    Any transportation issues/concerns?: No  Any patient concerns? Confirmed importance and date/time of follow-up visits scheduled: Yes CARDS  Confirmed with patient if condition begins to worsen call PCP or go to the ER. Yes    Patient was given the Call-a-Nurse line [phone]: Yes

## 2016-04-06 NOTE — Telephone Encounter (Signed)
Maria from [facility] called to tell us that pt is going to receive physical therapy 3x wk for 3 wks and she needs verbal orders for this. Her number is [phone]

## 2016-04-06 NOTE — Telephone Encounter (Signed)
Can you call and give her "verbal ok"

## 2016-04-13 ENCOUNTER — Encounter: Payer: Self-pay | Admitting: Internal Medicine

## 2016-04-13 ENCOUNTER — Ambulatory Visit (INDEPENDENT_AMBULATORY_CARE_PROVIDER_SITE_OTHER): Payer: Medicare Other | Admitting: Internal Medicine

## 2016-04-13 VITALS — BP 124/78 | HR 87 | Temp 98.7°F | Resp 14 | Ht 64.0 in | Wt 140.0 lb

## 2016-04-13 DIAGNOSIS — F329 Major depressive disorder, single episode, unspecified: Secondary | ICD-10-CM | POA: Diagnosis not present

## 2016-04-13 DIAGNOSIS — S72144D Nondisplaced intertrochanteric fracture of right femur, subsequent encounter for closed fracture with routine healing: Secondary | ICD-10-CM

## 2016-04-13 DIAGNOSIS — Z09 Encounter for follow-up examination after completed treatment for conditions other than malignant neoplasm: Secondary | ICD-10-CM

## 2016-04-13 DIAGNOSIS — F32A Depression, unspecified: Secondary | ICD-10-CM

## 2016-04-13 DIAGNOSIS — M8000XD Age-related osteoporosis with current pathological fracture, unspecified site, subsequent encounter for fracture with routine healing: Secondary | ICD-10-CM

## 2016-04-13 MED ORDER — ESCITALOPRAM OXALATE 5 MG PO TABS: 5.0000 mg | ORAL_TABLET | Freq: Every day | ORAL | 2 refills | Status: DC

## 2016-04-13 NOTE — Progress Notes (Addendum)
Subjective:    Patient ID: Sara [name], female    DOB: 02/21/1941, [age] y.o.   MRN: [id number]  DOS:  04/13/2016 Type of visit - description : TCM 14, here with her daughter Interval history: Admitted to hospital 03/30/2016, discharged 3 days later She went to the hospital after a fall at home, developed a hip fracture, right femoral neck. Had surgery (pin) 03/31/2016. Was recommended aspirin high dose for DVT prophylaxis 30 days, then go back on 81 mg  Review of Systems  Since she left the hospital she is back home, has physical therapy visiting her. Pain continue to be an issue, actually most of the pain is from the T8 vertebral fracture, currently on hydrocodone. When asked, she admits to some depression d/t losing her independenc , no suicidal ideas.   Denies fever chills No chest pain or difficulty breathing No nausea, vomiting, diarrhea or blood in the stools.  Past Medical History:  Diagnosis Date  . Anxiety   . Arthritis   . Bronchitis    hx of  . Carotid artery occlusion   . Chronic pain syndrome    pain management  . Coronary artery disease    sees Dr. Patty SermonsBrackbill  . GERD (gastroesophageal reflux disease)    hx of  . H/O hiatal hernia   . Hypercholesteremia   . Hypertension    all managed by Dr. Haynes DageBrack bill  . Mitral valve regurgitation    trace  . Non-traumatic compression fracture of thoracic vertebra (HCC)    T9  . Stroke [facility](HCC)     Past Surgical History:  Procedure Laterality Date  . BACK SURGERY     x 3 Dr Juliene PinaGeoffrey, [facility], rods, fusion, four surgery  . CARDIAC CATHETERIZATION  1994  . CAROTID ENDARTERECTOMY    . CARPAL TUNNEL RELEASE     left  . CHOLECYSTECTOMY    . ENDARTERECTOMY  04/24/2012   Procedure: ENDARTERECTOMY CAROTID;  Surgeon: Larina Earthlyodd F Early, MD;  [facility];  Service: Vascular;  Laterality: Left;  . HAND SURGERY     bilateral  . HIP FRACTURE SURGERY Left   . HIP PINNING,CANNULATED Left 02/08/2015   Procedure:  CANNULATED HIP PINNING;  Surgeon: Kathryne Hitchhristopher Y Blackman, MD;  Location: [facility] ORS;  Service: Orthopedics;  Laterality: Left;  . HIP PINNING,CANNULATED Right 03/31/2016   Procedure: CANNULATED HIP PINNING;  Surgeon: Kathryne Hitchhristopher Y Blackman, MD;  Location: [facility] ORS;  Service: Orthopedics;  Laterality: Right;  . HYSTEROTOMY    . SPINAL CORD STIMULATOR INSERTION    . TEE WITHOUT CARDIOVERSION  04/12/2012   Procedure: TRANSESOPHAGEAL ECHOCARDIOGRAM (TEE);  Surgeon: Lewayne BuntingBrian S Crenshaw, MD;  [facility];  Service: Cardiovascular;  Laterality: N/A;    Social History   Social History  . Marital status: Married    Spouse name: N/A  . Number of children: 4  . Years of education: N/A   Occupational History  . retired     Social History Main Topics  . Smoking status: Former Smoker    Packs/day: 0.50    Years: 60.00    Types: Cigarettes  . Smokeless tobacco: Never Used     Comment: quit 04-2015  . Alcohol use No  . Drug use: No  . Sexual activity: Not on file   Other Topics Concern  . Not on file   Social History Narrative   Lives w/ husband in a one story home.     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   Education: high school      Allergies as of 04/13/2016      Reactions   Morphine And Related Rash   Percocet [oxycodone-acetaminophen] Rash   Tolerates APAP (including IV), morphine, and Hydrocodone/APAP   Hydrochlorothiazide    Pt does not remember   Indomethacin    Pt does not remember   Paroxetine Hcl    Pt does not remember   Penicillins    Has patient had a PCN reaction causing immediate rash, facial/tongue/throat swelling, SOB or lightheadedness with hypotension: Yes Has patient had a PCN reaction causing severe rash involving mucus membranes or skin necrosis: No Has patient had a PCN reaction that required hospitalization No Has patient had a PCN reaction occurring within the last 10 years: No If all of the above answers are "NO", then may proceed with  Cephalosporin use.   Pravachol    Pt does not remember   Quinine Derivatives    Pt does not remember   Wellbutrin [bupropion]    agitation   Zocor [simvastatin - High Dose] Other (See Comments)   Weakness/no energy      Medication List       Accurate as of 04/13/16 11:59 PM. Always use your most recent med list.          amLODipine 10 MG tablet Commonly known as:  NORVASC TAKE 1 TABLET BY MOUTH EVERY DAY   aspirin 325 MG EC tablet Take 1 tablet (325 mg total) by mouth daily with breakfast. Please take for total of 30 days, then change back to aspirin 81 mg oral daily after 30 days   atorvastatin 40 MG tablet Commonly known as:  LIPITOR Take 0.5 tablets (20 mg total) by mouth daily.   diazepam 5 MG tablet Commonly known as:  VALIUM Take 1 tablet (5 mg total) by mouth daily as needed.   escitalopram 5 MG tablet Commonly known as:  LEXAPRO Take 1 tablet (5 mg total) by mouth daily.   gabapentin 600 MG tablet Commonly known as:  NEURONTIN Take 1 tablet (600 mg total) by mouth 4 (four) times daily.   HYDROcodone-acetaminophen 10-325 MG tablet Commonly known as:  NORCO Take 1 tablet by mouth every 4 (four) hours as needed for moderate pain (for pain).   metoprolol 50 MG tablet Commonly known as:  LOPRESSOR Take 0.5 tablets (25 mg total) by mouth 2 (two) times daily.   omeprazole 20 MG capsule Commonly known as:  PRILOSEC Take 1 capsule (20 mg total) by mouth daily.   vitamin B-12 1000 MCG tablet Commonly known as:  CYANOCOBALAMIN Take 1,000 mcg by mouth daily.          Objective:   Physical Exam BP 124/78 (BP Location: Left Arm, Patient Position: Sitting, Cuff Size: Small)   Pulse 87   Temp 98.7 F (37.1 C) (Oral)   Resp 14   Ht 5\' 4"  (1.626 m)   Wt 140 lb (63.5 kg)   SpO2 93%   BMI 24.03 kg/m  General:   Well developed, well nourished elderly lady, sitting in a wheelchair.Marland Kitchen  HEENT:  Normocephalic . Face symmetric, atraumatic Lungs:  CTA  B Normal respiratory effort, no intercostal retractions, no accessory muscle use. Heart: RRR,  no murmur.  No pretibial edema bilaterally  Skin: Not pale. Not jaundice Neurologic:  alert & oriented X3.  Speech normal  Psych--  Cognition and judgment appear intact.  Cooperative with normal attention span and concentration.  Behavior appropriate. 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 Brackbill --CAD  --H/o Stroke 2013:non hemorrhagic infarcts scattered throughout the [place] . 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 Osteoporosis: Last bone density test 08/2015, see results. Currently on no treatment. She is now willing to start  formal therapy. Will consult with orthopedic surgery first. Addendum: After further investigation,Prolia is a good choice under the  circumstances. Will set up. Depression: Since the last hip fracture, she is feeling depressed but no suicidal. Lost of independence is bothering her significantly. Recommend treatment, she is in agreement, see allergies, will try Lexapro 5 mg. Reassess in 6 weeks Chronic pain-under the care of Dr. Jordan LikesSpivey, currently on hydrocodone. Reportedly he liked to prescribe fentanyl patch but it was very expensive. Recommend to continue working w/pain mngmt .   RTC 6 weeks

## 2016-04-13 NOTE — Progress Notes (Signed)
Pre visit review using our clinic review tool, if applicable. No additional management support is needed unless otherwise documented below in the visit note. 

## 2016-04-13 NOTE — Patient Instructions (Addendum)
Start taking Lexapro  Go back to aspirin 81 mg 05/04/2015  Next visit in 6 weeks  Please see Dr. Donita BrooksSpiveymiley regards pain management

## 2016-04-14 ENCOUNTER — Encounter (INDEPENDENT_AMBULATORY_CARE_PROVIDER_SITE_OTHER): Payer: Self-pay | Admitting: Physician Assistant

## 2016-04-14 ENCOUNTER — Ambulatory Visit (INDEPENDENT_AMBULATORY_CARE_PROVIDER_SITE_OTHER): Payer: Medicare Other

## 2016-04-14 ENCOUNTER — Ambulatory Visit (INDEPENDENT_AMBULATORY_CARE_PROVIDER_SITE_OTHER): Payer: Medicare Other | Admitting: Physician Assistant

## 2016-04-14 DIAGNOSIS — Z9889 Other specified postprocedural states: Secondary | ICD-10-CM

## 2016-04-14 DIAGNOSIS — M25551 Pain in right hip: Secondary | ICD-10-CM

## 2016-04-14 DIAGNOSIS — F419 Anxiety disorder, unspecified: Secondary | ICD-10-CM

## 2016-04-14 DIAGNOSIS — F329 Major depressive disorder, single episode, unspecified: Secondary | ICD-10-CM | POA: Insufficient documentation

## 2016-04-14 MED ORDER — HYDROCODONE-ACETAMINOPHEN 10-325 MG PO TABS: 1.0000 | ORAL_TABLET | ORAL | 0 refills | Status: DC | PRN

## 2016-04-14 NOTE — Progress Notes (Cosign Needed)
Office Visit Note   Patient: Sara [name]           Date of Birth: 08/22/1940           MRN: [id number] Visit Date: 04/14/2016              Requested by: Wanda PlumpJose E Paz, MD [address] [place], [place] [id number] PCP: Willow OraJose Paz, MD   Assessment & Plan: Visit Diagnoses:  1. Pain in right hip   2. Status post hip surgery     Plan: Advance to weightbearing as tolerated right lower extremity. Staples were removed today Steri-Strips applied stable able to get incision wet. Physical therapy for gait balance training  Follow-Up Instructions: Return in about 4 weeks (around 05/12/2016).   Orders:  Orders Placed This Encounter  Procedures  . XR HIP UNILAT W OR W/O PELVIS 2-3 VIEWS RIGHT   Meds ordered this encounter  Medications  . HYDROcodone-acetaminophen (NORCO) 10-325 MG tablet    Sig: Take 1 tablet by mouth every 4 (four) hours as needed for moderate pain (for pain).    Dispense:  60 tablet    Refill:  0      Procedures: No procedures performed   Clinical Data: No additional findings.   Subjective: Chief Complaint  Patient presents with  . Right Hip - Routine Post Op    Patient is here for two week post op visit status post right hip cannulated screws on 03/31/2016. She states that she has been doing ok. Her hip is still bothering her, but it seems to be getting better. She ambulates with a walker. She states that physical therapy is going well. She takes Hydrocodone as needed for pain with relief. She will need a refill.    Review of Systems   Objective: Vital Signs: There were no vitals taken for this visit.  Physical Exam  Constitutional: She is oriented to person, place, and time. She appears well-developed and well-nourished. No distress.  Neurological: She is alert and oriented to person, place, and time.  Psychiatric: She has a normal mood and affect.    Ortho Exam Gentle range of motion of the hip was his discomfort but hip motion feels  fluid. Right calf supple and nontender. Dorsiflexion plantar flexion of the ankle is intact. Surgical incisions benign  Specialty Comments:  No specialty comments available.  Imaging: Xr Hip Unilat W Or W/o Pelvis 2-3 Views Right  Result Date: 04/14/2016 AP pelvis and lateral view of the right hip shows patient is status post cannulated pinning overall excellent position alignment. No other fractures identified. No hardware failure    PMFS History: Patient Active Problem List   Diagnosis Date Noted  . Depression 04/14/2016  . Leukocytosis 03/31/2016  . Renal insufficiency 03/31/2016  . Neuropathy (HCC) 02/12/2016  . AAA (abdominal aortic aneurysm) without rupture (HCC) 06/22/2015  . PCP NOTES >>>>>>>>>>>>>>>>>>>>>>>>>>>>>>>>>. 06/22/2015  . Malnutrition of moderate degree 06/05/2015  . Gait disorder 06/04/2015  . Elevated transaminase level 06/04/2015  . Pre-syncope 06/04/2015  . Hip fracture (HCC) 02/07/2015  . Closed left hip fracture (HCC) 02/07/2015  . Routine general medical examination at a health care facility 01/22/2013  . Caregiver stress 01/22/2013  . 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 Chronic back pain 08/29/2012  . Occlusion and stenosis of carotid artery without mention of cerebral infarction 05/09/2012  . Carotid stenosis, left 04/13/2012  . 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 CVA (cerebral infarction) 04/11/2012  .  Arthritis 08/05/2010  . Tobacco abuse 08/05/2010  . Mitral valve regurgitation   . Hypertension   . Hypercholesteremia   . Coronary artery disease    Past Medical History:  Diagnosis Date  . Anxiety   . Arthritis   . Bronchitis    hx of  . Carotid artery occlusion   . Chronic pain syndrome    pain management  . Coronary artery disease    sees Dr. Patty SermonsBrackbill  . GERD (gastroesophageal reflux disease)    hx of  . H/O hiatal hernia   . Hypercholesteremia   . Hypertension    all managed by Dr. Haynes DageBrack bill  .  Mitral valve regurgitation    trace  . Non-traumatic compression fracture of thoracic vertebra (HCC)    T9  . Stroke [facility](HCC)     Family History  Problem Relation Age of Onset  . Heart attack Father   . Heart disease Father     before age 77  . Heart disease Sister   . Cancer Sister   . Heart disease Sister     Past Surgical History:  Procedure Laterality Date  . BACK SURGERY     x 3 Dr Juliene PinaGeoffrey, [facility] 2005, rods, fusion, four surgery  . CARDIAC CATHETERIZATION  1994  . CAROTID ENDARTERECTOMY    . CARPAL TUNNEL RELEASE     left  . CHOLECYSTECTOMY    . ENDARTERECTOMY  04/24/2012   Procedure: ENDARTERECTOMY CAROTID;  Surgeon: Larina Earthlyodd F Early, MD;  [facility];  Service: Vascular;  Laterality: Left;  . HAND SURGERY     bilateral  . HIP FRACTURE SURGERY Left   . HIP PINNING,CANNULATED Left 02/08/2015   Procedure: CANNULATED HIP PINNING;  Surgeon: Kathryne Hitchhristopher Y Blackman, MD;  Location: [facility] ORS;  Service: Orthopedics;  Laterality: Left;  . HIP PINNING,CANNULATED Right 03/31/2016   Procedure: CANNULATED HIP PINNING;  Surgeon: Kathryne Hitchhristopher Y Blackman, MD;  Location: [facility] ORS;  Service: Orthopedics;  Laterality: Right;  . HYSTEROTOMY    . SPINAL CORD STIMULATOR INSERTION    . TEE WITHOUT CARDIOVERSION  04/12/2012   Procedure: TRANSESOPHAGEAL ECHOCARDIOGRAM (TEE);  Surgeon: Lewayne BuntingBrian S Crenshaw, MD;  [facility];  Service: Cardiovascular;  Laterality: N/A;   Social History   Occupational History  . retired     Social History Main Topics  . Smoking status: Former Smoker    Packs/day: 0.50    Years: 60.00    Types: Cigarettes  . Smokeless tobacco: Never Used     Comment: quit 04-2015  . Alcohol use No  . Drug use: No  . Sexual activity: Not on file

## 2016-04-14 NOTE — Assessment & Plan Note (Addendum)
R hip fracture: Recovering from surgery, will see her orthopedic doctor tomorrow. Osteoporosis: Last bone density test 08/2015, see results. Currently on no treatment. She is now willing to start  formal therapy. Will consult with orthopedic surgery first. Addendum: After further investigation,Prolia is a good choice under the circumstances. Will set up. Depression: Since the last hip fracture, she is feeling depressed but no suicidal. Lost of independence is bothering her significantly. Recommend treatment, she is in agreement, see allergies, will try Lexapro 5 mg. Reassess in 6 weeks Chronic pain-under the care of Dr. Jordan LikesSpivey, currently on hydrocodone. Reportedly he liked to prescribe fentanyl patch but it was very expensive. Recommend to continue working w/pain mngmt .   RTC 6 weeks

## 2016-04-15 ENCOUNTER — Other Ambulatory Visit: Payer: Self-pay | Admitting: Internal Medicine

## 2016-04-29 ENCOUNTER — Telehealth: Payer: Self-pay | Admitting: Internal Medicine

## 2016-04-29 ENCOUNTER — Other Ambulatory Visit: Payer: Self-pay | Admitting: Internal Medicine

## 2016-04-29 NOTE — Telephone Encounter (Signed)
See my last office visit note, she has osteoporosis, a good choice for her treatment is PROLIA. Please start the approval process

## 2016-04-29 NOTE — Telephone Encounter (Signed)
Insurance verification initiated via [employer] online portal. Awaiting summary of benefits.

## 2016-05-04 NOTE — Telephone Encounter (Signed)
Received fax back stating patient's insurance does not cover prolia, Please verify we have the correct insurance

## 2016-05-04 NOTE — Telephone Encounter (Signed)
Would you like to initiate [employer] benefits?

## 2016-05-07 ENCOUNTER — Ambulatory Visit: Payer: Medicare Other | Admitting: Neurology

## 2016-05-12 ENCOUNTER — Ambulatory Visit (INDEPENDENT_AMBULATORY_CARE_PROVIDER_SITE_OTHER): Payer: Medicare Other | Admitting: Physician Assistant

## 2016-05-14 ENCOUNTER — Ambulatory Visit: Payer: Medicare Other | Admitting: Internal Medicine

## 2016-05-14 NOTE — Telephone Encounter (Signed)
Yes please

## 2016-05-19 ENCOUNTER — Encounter (INDEPENDENT_AMBULATORY_CARE_PROVIDER_SITE_OTHER): Payer: Self-pay | Admitting: Physician Assistant

## 2016-05-19 ENCOUNTER — Ambulatory Visit (INDEPENDENT_AMBULATORY_CARE_PROVIDER_SITE_OTHER): Payer: Medicare Other | Admitting: Physician Assistant

## 2016-05-19 DIAGNOSIS — Z9889 Other specified postprocedural states: Secondary | ICD-10-CM

## 2016-05-19 DIAGNOSIS — Z8781 Personal history of (healed) traumatic fracture: Secondary | ICD-10-CM

## 2016-05-19 NOTE — Progress Notes (Signed)
Office Visit Note   Patient: Sara [name]           Date of Birth: 12/10/1940           MRN: [id number] Visit Date: 05/19/2016              Requested by: Wanda PlumpJose E Paz, MD [address] [place], [place] [id number] PCP: Willow OraJose Paz, MD   Assessment & Plan: Visit Diagnoses:  1. Status post-operative repair of closed hip fracture     Plan: Iliotibial band stretching exercises. Follow up in 1 month at that time obtain AP and lateral view of the right hip.  Follow-Up Instructions: Return in about 4 weeks (around 06/16/2016) for Radiographs.   Orders:  No orders of the defined types were placed in this encounter.  No orders of the defined types were placed in this encounter.     Procedures: No procedures performed   Clinical Data: No additional findings.   Subjective: Chief Complaint  Patient presents with  . Right Hip - Follow-up  . Follow-up    HPI Seven week status post cannulated pinning right hip. Patient is doing okay but if she lies on the hip she has pain lateral aspect. She is ambulating with a cane. Review of Systems   Objective: Vital Signs: There were no vitals taken for this visit.  Physical Exam  Ortho Exam Right hip excellent range of motion without pain. Surgical incisions healing well no signs of infection. Tenderness over the right trochanteric region with palpation Specialty Comments:  No specialty comments available.  Imaging: No results found.   PMFS History: Patient Active Problem List   Diagnosis Date Noted  . Depression 04/14/2016  . Leukocytosis 03/31/2016  . Renal insufficiency 03/31/2016  . Neuropathy (HCC) 02/12/2016  . AAA (abdominal aortic aneurysm) without rupture (HCC) 06/22/2015  . PCP NOTES >>>>>>>>>>>>>>>>>>>>>>>>>>>>>>>>>. 06/22/2015  . Malnutrition of moderate degree 06/05/2015  . Gait disorder 06/04/2015  . Elevated transaminase level 06/04/2015  . Pre-syncope 06/04/2015  . Hip fracture (HCC) 02/07/2015   . Closed left hip fracture (HCC) 02/07/2015  . Routine general medical examination at a health care facility 01/22/2013  . Caregiver stress 01/22/2013  . 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 Chronic back pain 08/29/2012  . Occlusion and stenosis of carotid artery without mention of cerebral infarction 05/09/2012  . Carotid stenosis, left 04/13/2012  . 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 CVA (cerebral infarction) 04/11/2012  . Arthritis 08/05/2010  . Tobacco abuse 08/05/2010  . Mitral valve regurgitation   . Hypertension   . Hypercholesteremia   . Coronary artery disease    Past Medical History:  Diagnosis Date  . Anxiety   . Arthritis   . Bronchitis    hx of  . Carotid artery occlusion   . Chronic pain syndrome    pain management  . Coronary artery disease    sees Dr. Patty SermonsBrackbill  . GERD (gastroesophageal reflux disease)    hx of  . H/O hiatal hernia   . Hypercholesteremia   . Hypertension    all managed by Dr. Haynes DageBrack bill  . Mitral valve regurgitation    trace  . Non-traumatic compression fracture of thoracic vertebra (HCC)    T9  . Stroke [facility](HCC)     Family History  Problem Relation Age of Onset  . Heart attack Father   . Heart disease Father     before age 78  . Heart disease Sister   .  Cancer Sister   . Heart disease Sister     Past Surgical History:  Procedure Laterality Date  . BACK SURGERY     x 3 Dr Juliene Pina, last @ [facility] ~ 2005, rods, fusion, four surgery  . CARDIAC CATHETERIZATION  1994  . CAROTID ENDARTERECTOMY    . CARPAL TUNNEL RELEASE     left  . CHOLECYSTECTOMY    . ENDARTERECTOMY  04/24/2012   Procedure: ENDARTERECTOMY CAROTID;  Surgeon: Larina Earthly, MD;  Location: [facility] OR;  Service: Vascular;  Laterality: Left;  . HAND SURGERY     bilateral  . HIP FRACTURE SURGERY Left   . HIP PINNING,CANNULATED Left 02/08/2015   Procedure: CANNULATED HIP PINNING;  Surgeon: Kathryne Hitch, MD;  Location: [facility] ORS;  Service:  Orthopedics;  Laterality: Left;  . HIP PINNING,CANNULATED Right 03/31/2016   Procedure: CANNULATED HIP PINNING;  Surgeon: Kathryne Hitch, MD;  Location: [facility] ORS;  Service: Orthopedics;  Laterality: Right;  . HYSTEROTOMY    . SPINAL CORD STIMULATOR INSERTION    . TEE WITHOUT CARDIOVERSION  04/12/2012   Procedure: TRANSESOPHAGEAL ECHOCARDIOGRAM (TEE);  Surgeon: Lewayne Bunting, MD;  Location[facility] ENDOSCOPY;  Service: Cardiovascular;  Laterality: N/A;   Social History   Occupational History  . retired     Social History Main Topics  . Smoking status: Former Smoker    Packs/day: 0.50    Years: 60.00    Types: Cigarettes  . Smokeless tobacco: Never Used     Comment: quit 04-2015  . Alcohol use No  . Drug use: No  . Sexual activity: Not on file

## 2016-05-19 NOTE — Telephone Encounter (Signed)
[facility] , Reclast is covered at 80/20 w/ co-insurance of 20% for cost of Reclast as well as a $275 dollar co-pay "rent" for short stay/outpatient facility that is not related to surgery out of pocket for Pt. Spoke w/ Melvenia BeamSimon w/ prior authorization/certification- no determination or authorization is needed for J-code J3488 (Reclast). If further questions, insurance number is [id number] option 2.

## 2016-05-21 NOTE — Telephone Encounter (Signed)
let the patient know about the cost and proceed. Thank you

## 2016-05-21 NOTE — Telephone Encounter (Signed)
Appt w/ PCP on 05/25/2016 will discuss cost w/ Pt then.

## 2016-05-24 ENCOUNTER — Ambulatory Visit (INDEPENDENT_AMBULATORY_CARE_PROVIDER_SITE_OTHER): Payer: Medicare Other | Admitting: Neurology

## 2016-05-24 ENCOUNTER — Encounter: Payer: Self-pay | Admitting: Neurology

## 2016-05-24 VITALS — BP 120/70 | HR 70 | Ht 64.0 in | Wt 147.3 lb

## 2016-05-24 DIAGNOSIS — M792 Neuralgia and neuritis, unspecified: Secondary | ICD-10-CM | POA: Diagnosis not present

## 2016-05-24 DIAGNOSIS — I739 Peripheral vascular disease, unspecified: Secondary | ICD-10-CM

## 2016-05-24 DIAGNOSIS — G609 Hereditary and idiopathic neuropathy, unspecified: Secondary | ICD-10-CM

## 2016-05-24 NOTE — Patient Instructions (Signed)
1.  Continue gabapentin 900mg  in the morning and 1200mg  at bedtime 2.  Start using a rollator for walking assistance 3.  See if hand rails in the bathtub would help you be more stable  Return to clinic in 9 months

## 2016-05-24 NOTE — Progress Notes (Signed)
Follow-up Visit   Date: 05/24/16    Sara [name] MRN: [id number] DOB: 06/13/1940   Interim History: Sara BrickRebecca S [name] is a 76 y.o. right-handed Caucasian female with stroke, hypertension, hyperlipidemia, CAD, stroke with residual right hand weakness, left carotid stenosis s/p L CEA, chronic low back pain s/p lumbar fusion and s/p spinal cord stimulator, and anxiety returning to the clinic for follow-up of sensory neuropathy.  The patient was accompanied to the clinic by daughter who also provides collateral information.    History of present illness: Starting around 2012, she began having burning sensation of the feet which is worse at night.  It prevents her from sleeping.  She has been taking gabapentin 600mg  in the morning and 1200mg  at bedtime which helps.  She endorses imbalance and broke her left hip in November 2016.  She has generalized weakness and difficulty with walking.  Some of this is due to chronic low back pain which is followed by Dr. Jordan LikesSpivey in Pain Management.  She has achy pain of the legs with walking, which is improved with rest.  Her arterial studies of the legs show peripheral vascular disease with likely occlusoin of the left common iliac artery as well as > 50% right common iliac and bilateral external iliac arteries.  She takes aspirin 81mg  daily.   She quit smoking earlier this year. No history of diabetes, alcohol, or family history of neuropathy.  UPDATE 05/24/2016:   She is here for follow and states that her neuropathic pain is well controlled on gabapentin 600mg  in the morning and 1200mg  at bedtime.  She unfortunately has an interval fall in the bathtub and fractured her right hip at the femoral neck.  She underwent surgery for this in December and has been recovered well.  She tends to use a cane most of the time for gait support, despite recommendations to use a rollator which she already has two of at home.   Medications:  Current Outpatient Prescriptions on  File Prior to Visit  Medication Sig Dispense Refill  . amLODipine (NORVASC) 10 MG tablet TAKE 1 TABLET BY MOUTH EVERY DAY (Patient taking differently: TAKE 1/2 TABLET BY MOUTH EVERY DAY) 30 tablet 6  . aspirin 325 MG EC tablet Take 1 tablet (325 mg total) by mouth daily with breakfast. Please take for total of 30 days, then change back to aspirin 81 mg oral daily after 30 days 30 tablet 0  . atorvastatin (LIPITOR) 40 MG tablet Take 0.5 tablets (20 mg total) by mouth daily. 15 tablet 1  . diazepam (VALIUM) 5 MG tablet Take 1 tablet (5 mg total) by mouth daily as needed. 30 tablet 2  . escitalopram (LEXAPRO) 5 MG tablet Take 1 tablet (5 mg total) by mouth daily. 30 tablet 2  . gabapentin (NEURONTIN) 600 MG tablet Take 1 tablet (600 mg total) by mouth 4 (four) times daily. 120 tablet 6  . HYDROcodone-acetaminophen (NORCO) 10-325 MG tablet Take 1 tablet by mouth every 4 (four) hours as needed for moderate pain (for pain). 60 tablet 0  . metoprolol (LOPRESSOR) 50 MG tablet Take 0.5 tablets (25 mg total) by mouth 2 (two) times daily. 30 tablet 5  . omeprazole (PRILOSEC) 20 MG capsule Take 1 capsule (20 mg total) by mouth daily. 30 capsule 5  . vitamin B-12 (CYANOCOBALAMIN) 1000 MCG tablet Take 1,000 mcg by mouth daily.     No current facility-administered medications on file prior to visit.     Allergies:  Allergies  Allergen Reactions  . Morphine And Related Rash  . Percocet [Oxycodone-Acetaminophen] Rash    Tolerates APAP (including IV), morphine, and Hydrocodone/APAP  . Hydrochlorothiazide     Pt does not remember  . Indomethacin     Pt does not remember  . Paroxetine Hcl     Pt does not remember  . Penicillins     Has patient had a PCN reaction causing immediate rash, facial/tongue/throat swelling, SOB or lightheadedness with hypotension: Yes Has patient had a PCN reaction causing severe rash involving mucus membranes or skin necrosis: No Has patient had a PCN reaction that required  hospitalization No Has patient had a PCN reaction occurring within the last 10 years: No If all of the above answers are "NO", then may proceed with Cephalosporin use.   . Pravachol     Pt does not remember  . Quinine Derivatives     Pt does not remember  . Wellbutrin [Bupropion]     agitation  . Zocor [Simvastatin - High Dose] Other (See Comments)    Weakness/no energy    Review of Systems:  CONSTITUTIONAL: No fevers, chills, night sweats, or weight loss.  EYES: No visual changes or eye pain ENT: No hearing changes.  No history of nose bleeds.   RESPIRATORY: No cough, wheezing and shortness of breath.   CARDIOVASCULAR: Negative for chest pain, and palpitations.   GI: Negative for abdominal discomfort, blood in stools or black stools.  No recent change in bowel habits.   GU:  No history of incontinence.   MUSCLOSKELETAL: +history of joint pain or swelling.  No myalgias.   SKIN: Negative for lesions, rash, and itching.   ENDOCRINE: Negative for cold or heat intolerance, polydipsia or goiter.   PSYCH:  + depression or anxiety symptoms.   NEURO: As Above.   Vital Signs:  BP 120/70   Pulse 70   Ht 5\' 4"  (1.626 m)   Wt 147 lb 5 oz (66.8 kg)   SpO2 98%   BMI 25.29 kg/m   Neurological Exam: MENTAL STATUS including orientation to time, place, person, recent and remote memory, attention span and concentration, language, and fund of knowledge is normal.  Speech is not dysarthric.  CRANIAL NERVES: Pupils equal round and reactive to light.  Normal conjugate, extra-ocular eye movements in all directions of gaze.  Left ptosis (old).  Face is symmetric.   MOTOR:  Motor strength is 5/5 in all extremities.  There is a chin tremor present at rest and with action.   Tone is normal.    MSRs:  Reflexes are 2+/4 throughout, except absent Achilles bilaterally.  SENSORY:  Reduced temperature distal to ankles and reduced vibration over the left ankle only  COORDINATION/GAIT:  Gait is slow and  assisted with cane, appears stable.  Data: NCS/EMG of the legs 9/142017:  The electrophysiologic testing is most consistent with a purely sensory, axonal polyneuropathy affecting the lower extremities.  MRI lumbar spine 09/25/2013:  1. Solid fusion at L3-4 and L4-5 without significant residual or recurrent stenosis. Transitional anatomy at L5. 2. Leftward disc bulging and asymmetric facet hypertrophy at L1-2 with mild left lateral recess narrowing. 3. Leftward disc bulging and mild bilateral facet hypertrophy without significant stenosis.  IMPRESSION/PLAN: 1.  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  Her labs are also within normal limits, except for a poorly restricted band seen on  her serum electrophoresis. Recheck and SPEP and UPEP with IFE at next visit  2.  Peripheral vascular disease causing claudication of the lower extremities  3.  Chronic low back pain, followed by Dr. Jordan Likes  4.  Multifactorial gait disorder.  Strongly encouraged her to use a rollator and be sure that she has hand rails in the bathtub.  Fall precautions were stressed.  5.  Former tobacco use, quit early 2017  Return to clinic in 9 months  The duration of this appointment visit was 20 minutes of face-to-face time with the patient.  Greater than 50% of this time was spent in counseling, explanation of diagnosis, planning of further management, and coordination of care.   Thank you for allowing me to participate in patient's care.  If I can answer any additional questions, I would be pleased to do so.    Sincerely,    Dorlis Judice K. Allena Katz, DO

## 2016-05-25 ENCOUNTER — Ambulatory Visit (INDEPENDENT_AMBULATORY_CARE_PROVIDER_SITE_OTHER): Payer: Medicare Other | Admitting: Internal Medicine

## 2016-05-25 ENCOUNTER — Encounter: Payer: Self-pay | Admitting: Internal Medicine

## 2016-05-25 VITALS — BP 118/74 | HR 78 | Temp 98.0°F | Resp 14 | Ht 64.0 in | Wt 149.0 lb

## 2016-05-25 DIAGNOSIS — M8000XD Age-related osteoporosis with current pathological fracture, unspecified site, subsequent encounter for fracture with routine healing: Secondary | ICD-10-CM

## 2016-05-25 DIAGNOSIS — F329 Major depressive disorder, single episode, unspecified: Secondary | ICD-10-CM

## 2016-05-25 DIAGNOSIS — F32A Depression, unspecified: Secondary | ICD-10-CM

## 2016-05-25 MED ORDER — ALENDRONATE SODIUM 70 MG PO TABS: 70.0000 mg | ORAL_TABLET | ORAL | 11 refills | Status: DC

## 2016-05-25 NOTE — Progress Notes (Signed)
Pre visit review using our clinic review tool, if applicable. No additional management support is needed unless otherwise documented below in the visit note. 

## 2016-05-25 NOTE — Progress Notes (Signed)
Subjective:    Patient ID: Sara [name], female    DOB: Nov 13, 1940, 76 y.o.   MRN: [id number]  DOS:  05/25/2016 Type of visit - description :  Follow-up, here with her daughter Interval history:  Since the last time she was here she is gradually recovering. Walking with a can, uses a walker at home , mobility improved Husband is helping with her ADLs  Also, started Lexapro. Feeling better   Review of Systems  Denies nausea, vomiting, diarrhea. No blood in the stools or abdominal pain  Past Medical History:  Diagnosis Date  . Anxiety   . Arthritis   . Bronchitis    hx of  . Carotid artery occlusion   . Chronic pain syndrome    pain management  . Coronary artery disease    sees Dr. Patty Sermons  . GERD (gastroesophageal reflux disease)    hx of  . H/O hiatal hernia   . Hypercholesteremia   . Hypertension    all managed by Dr. Haynes Dage bill  . Mitral valve regurgitation    trace  . Non-traumatic compression fracture of thoracic vertebra (HCC)    T9  . Stroke [facility])     Past Surgical History:  Procedure Laterality Date  . BACK SURGERY     x 3 Dr Juliene Pina, last @ [facility] ~ 2005, rods, fusion, four surgery  . CARDIAC CATHETERIZATION  1994  . CAROTID ENDARTERECTOMY    . CARPAL TUNNEL RELEASE     left  . CHOLECYSTECTOMY    . ENDARTERECTOMY  04/24/2012   Procedure: ENDARTERECTOMY CAROTID;  Surgeon: Larina Earthly, MD;  Location: [facility] OR;  Service: Vascular;  Laterality: Left;  . HAND SURGERY     bilateral  . HIP FRACTURE SURGERY Left   . HIP PINNING,CANNULATED Left 02/08/2015   Procedure: CANNULATED HIP PINNING;  Surgeon: Kathryne Hitch, MD;  Location: [facility] ORS;  Service: Orthopedics;  Laterality: Left;  . HIP PINNING,CANNULATED Right 03/31/2016   Procedure: CANNULATED HIP PINNING;  Surgeon: Kathryne Hitch, MD;  Location: [facility] ORS;  Service: Orthopedics;  Laterality: Right;  . HYSTEROTOMY    . SPINAL CORD STIMULATOR INSERTION    . TEE WITHOUT CARDIOVERSION   04/12/2012   Procedure: TRANSESOPHAGEAL ECHOCARDIOGRAM (TEE);  Surgeon: Lewayne Bunting, MD;  Location: [facility] ENDOSCOPY;  Service: Cardiovascular;  Laterality: N/A;    Social History   Social History  . Marital status: Married    Spouse name: N/A  . Number of children: 4  . Years of education: N/A   Occupational History  . retired     Social History Main Topics  . Smoking status: Former Smoker    Packs/day: 0.50    Years: 60.00    Types: Cigarettes  . Smokeless tobacco: Never Used     Comment: quit 04-2015  . Alcohol use No  . Drug use: No  . Sexual activity: Not on file   Other Topics Concern  . Not on file   Social History Narrative   Lives w/ husband in a one story home.     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   Education: high school      Allergies as of 05/25/2016      Reactions   Morphine And Related Rash   Percocet [oxycodone-acetaminophen] Rash   Tolerates APAP (including IV), morphine, and Hydrocodone/APAP   Hydrochlorothiazide    Pt does not remember   Indomethacin    Pt does not remember  Paroxetine Hcl    Pt does not remember   Penicillins    Has patient had a PCN reaction causing immediate rash, facial/tongue/throat swelling, SOB or lightheadedness with hypotension: Yes Has patient had a PCN reaction causing severe rash involving mucus membranes or skin necrosis: No Has patient had a PCN reaction that required hospitalization No Has patient had a PCN reaction occurring within the last 10 years: No If all of the above answers are "NO", then may proceed with Cephalosporin use.   Pravachol    Pt does not remember   Quinine Derivatives    Pt does not remember   Wellbutrin [bupropion]    agitation   Zocor [simvastatin - High Dose] Other (See Comments)   Weakness/no energy      Medication List       Accurate as of 05/25/16 11:59 PM. Always use your most recent med list.          alendronate 70 MG tablet Commonly known as:   FOSAMAX Take 1 tablet (70 mg total) by mouth every 7 (seven) days. Take with a full glass of water on an empty stomach.   amLODipine 10 MG tablet Commonly known as:  NORVASC Take 5 mg by mouth daily.   aspirin 325 MG EC tablet Take 1 tablet (325 mg total) by mouth daily with breakfast. Please take for total of 30 days, then change back to aspirin 81 mg oral daily after 30 days   atorvastatin 40 MG tablet Commonly known as:  LIPITOR Take 0.5 tablets (20 mg total) by mouth daily.   diazepam 5 MG tablet Commonly known as:  VALIUM Take 1 tablet (5 mg total) by mouth daily as needed.   escitalopram 5 MG tablet Commonly known as:  LEXAPRO Take 1 tablet (5 mg total) by mouth daily.   gabapentin 600 MG tablet Commonly known as:  NEURONTIN Take 1 tablet (600 mg total) by mouth 4 (four) times daily.   HYDROcodone-acetaminophen 10-325 MG tablet Commonly known as:  NORCO Take 1 tablet by mouth every 4 (four) hours as needed for moderate pain (for pain).   metoprolol 50 MG tablet Commonly known as:  LOPRESSOR Take 0.5 tablets (25 mg total) by mouth 2 (two) times daily.   omeprazole 20 MG capsule Commonly known as:  PRILOSEC Take 1 capsule (20 mg total) by mouth daily.   vitamin B-12 1000 MCG tablet Commonly known as:  CYANOCOBALAMIN Take 1,000 mcg by mouth daily.          Objective:   Physical Exam BP 118/74 (BP Location: Left Arm, Patient Position: Sitting, Cuff Size: Small)   Pulse 78   Temp 98 F (36.7 C) (Oral)   Resp 14   Ht 5\' 4"  (1.626 m)   Wt 149 lb (67.6 kg)   SpO2 98%   BMI 25.58 kg/m  General:   Well developed, well nourished . NAD.  HEENT:  Normocephalic . Face symmetric, atraumatic Lungs:  CTA B Normal respiratory effort, no intercostal retractions, no accessory muscle use. Heart: RRR,  no murmur.  No pretibial edema bilaterally  Skin: Not pale. Not jaundice Neurologic:  alert & oriented X3.  Speech normal, gait assisted by a cane, appropriate for  age and history of fractures Psych--  Cognition and judgment appear intact.  Cooperative with normal attention span and concentration.  Behavior appropriate. No anxious or depressed appearing.      Assessment & Plan:    Assessment Pre-diabetes-- a1c 5.9 2013 Neuropathy , NCS  12-2015  purely sensory, axonal  polyneuropathy  HTN Hyperlipidemia Anxiety-- rarely uses diazepam CV:Dr. Brackbill --CAD  --H/o Stroke 2013:non hemorrhagic infarcts scattered throughout the [place] . 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, per CT 08/2015, 2 years [facility]: -Osteoporosis  last DEXA -hip fx 01-2015 -Vertebral FX (s/p v-plasty) - R Hip Fx 03-2016 -Chronic back pain , s/p spinal cord stimulator  Dr Jordan LikesSpivey   Abnormal CT abd/pelvis  2017: See full report, some of the findings>> Atherosclerotic ulcer left subclavian T 8 compression fracture Infrarenal abdominal aortic aneurysm 3.5 cm, follow-up 2 years Narrow  left renal artery , SMA occluded with pancreatic collaterals Mild mesenteric adenopathy, possibly stable, consider 2 year follow-up  R distal ureter enhancement per CT: Saw urology 07-2015, Rx a ultrasound   R adrenal adenoma Smoker , quit ~ 04-2015  Urosepsis 05-2015   PLAN: Osteoporosis: We were unable to get prolia ok by her insurance, reclast was very expensive for her, we talk about Fosamax today, precautions discussed, she agreed to take it. Prescription sent. Continue calcium and vitamin D Depression: Started a low dose of Lexapro, feeling great. No change, refill as needed RTC 4 months

## 2016-05-25 NOTE — Patient Instructions (Signed)
  GO TO THE FRONT DESK Schedule your next appointment for a  routine checkup in 4 months   Start taking Fosamax once weekly, follow the precautions, if you develop chest pain, difficulty swallowing, heartburn: Let us know immediately

## 2016-05-26 NOTE — Assessment & Plan Note (Signed)
Osteoporosis: We were unable to get prolia ok by her insurance, reclast was very expensive for her, we talk about Fosamax today, precautions discussed, she agreed to take it. Prescription sent. Continue calcium and vitamin D Depression: Started a low dose of Lexapro, feeling great. No change, refill as needed RTC 4 months

## 2016-05-28 ENCOUNTER — Other Ambulatory Visit: Payer: Self-pay | Admitting: Internal Medicine

## 2016-06-09 ENCOUNTER — Ambulatory Visit (INDEPENDENT_AMBULATORY_CARE_PROVIDER_SITE_OTHER): Payer: Medicare Other | Admitting: Physician Assistant

## 2016-06-09 ENCOUNTER — Ambulatory Visit (INDEPENDENT_AMBULATORY_CARE_PROVIDER_SITE_OTHER): Payer: Medicare Other

## 2016-06-09 DIAGNOSIS — Z9889 Other specified postprocedural states: Secondary | ICD-10-CM

## 2016-06-09 DIAGNOSIS — Z8781 Personal history of (healed) traumatic fracture: Secondary | ICD-10-CM

## 2016-06-09 DIAGNOSIS — M25551 Pain in right hip: Secondary | ICD-10-CM

## 2016-06-09 NOTE — Progress Notes (Signed)
Office Visit Note   Patient: Sara [name]           Date of Birth: 02/03/1941           MRN: [id number] Visit Date: 06/09/2016              Requested by: Wanda PlumpJose E Paz, MD [address] [place], [place] [id number] PCP: Willow OraJose Paz, MD   Assessment & Plan: Visit Diagnoses:  1. Pain in right hip   2. Status post-operative repair of hip fracture     Plan: She'll follow up with us on a when necessary basis if she has any questions or concerns.  Follow-Up Instructions: Return if symptoms worsen or fail to improve.   Orders:  Orders Placed This Encounter  Procedures  . XR HIP UNILAT W OR W/O PELVIS 2-3 VIEWS RIGHT   No orders of the defined types were placed in this encounter.     Procedures: No procedures performed   Clinical Data: No additional findings.   Subjective: No chief complaint on file.   HPI This is Cleotis LemaSteed is now 2-1/2 months status post right hip fracture cannulated pinning. Overall doing well and has no complaints. States her range of motion and strength improving she still ambulate with a cane. She has no complaints otherwise Review of Systems   Objective: Vital Signs: There were no vitals taken for this visit.  Physical Exam  Ortho Exam Hip excellent range of motion without pain. She is able to cross her legs. Specialty Comments:  No specialty comments available.  Imaging: Xr Hip Unilat W Or W/o Pelvis 2-3 Views Right  Result Date: 06/09/2016 AP pelvis: Status post bilateral hip pinning. I hip fracture. We'll well-healed. There is no hardware failure. No acute fracture    PMFS History: Patient Active Problem List   Diagnosis Date Noted  . Depression 04/14/2016  . Leukocytosis 03/31/2016  . Renal insufficiency 03/31/2016  . Neuropathy (HCC) 02/12/2016  . AAA (abdominal aortic aneurysm) without rupture (HCC) 06/22/2015  . PCP NOTES >>>>>>>>>>>>>>>>>>>>>>>>>>>>>>>>>. 06/22/2015  . Malnutrition of moderate degree 06/05/2015  .  Gait disorder 06/04/2015  . Elevated transaminase level 06/04/2015  . Pre-syncope 06/04/2015  . Hip fracture (HCC) 02/07/2015  . Closed left hip fracture (HCC) 02/07/2015  . Routine general medical examination at a health care facility 01/22/2013  . Caregiver stress 01/22/2013  . 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 Chronic back pain 08/29/2012  . Occlusion and stenosis of carotid artery without mention of cerebral infarction 05/09/2012  . Carotid stenosis, left 04/13/2012  . 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 CVA (cerebral infarction) 04/11/2012  . Arthritis 08/05/2010  . Tobacco abuse 08/05/2010  . Mitral valve regurgitation   . Hypertension   . Hypercholesteremia   . Coronary artery disease    Past Medical History:  Diagnosis Date  . Anxiety   . Arthritis   . Bronchitis    hx of  . Carotid artery occlusion   . Chronic pain syndrome    pain management  . Coronary artery disease    sees Dr. Patty SermonsBrackbill  . GERD (gastroesophageal reflux disease)    hx of  . H/O hiatal hernia   . Hypercholesteremia   . Hypertension    all managed by Dr. Haynes DageBrack bill  . Mitral valve regurgitation    trace  . Non-traumatic compression fracture of thoracic vertebra (HCC)    T9  . Stroke [facility](HCC)     Family History  Problem Relation Age of Onset  . Heart attack Father   . Heart disease Father     before age 34  . Heart disease Sister   . Cancer Sister   . Heart disease Sister     Past Surgical History:  Procedure Laterality Date  . BACK SURGERY     x 3 Dr Juliene Pina, last @ [facility] ~ 2005, rods, fusion, four surgery  . CARDIAC CATHETERIZATION  1994  . CAROTID ENDARTERECTOMY    . CARPAL TUNNEL RELEASE     left  . CHOLECYSTECTOMY    . ENDARTERECTOMY  04/24/2012   Procedure: ENDARTERECTOMY CAROTID;  Surgeon: Larina Earthly, MD;  Location: [facility] OR;  Service: Vascular;  Laterality: Left;  . HAND SURGERY     bilateral  . HIP FRACTURE SURGERY Left   . HIP  PINNING,CANNULATED Left 02/08/2015   Procedure: CANNULATED HIP PINNING;  Surgeon: Kathryne Hitch, MD;  Location: [facility] ORS;  Service: Orthopedics;  Laterality: Left;  . HIP PINNING,CANNULATED Right 03/31/2016   Procedure: CANNULATED HIP PINNING;  Surgeon: Kathryne Hitch, MD;  Location: [facility] ORS;  Service: Orthopedics;  Laterality: Right;  . HYSTEROTOMY    . SPINAL CORD STIMULATOR INSERTION    . TEE WITHOUT CARDIOVERSION  04/12/2012   Procedure: TRANSESOPHAGEAL ECHOCARDIOGRAM (TEE);  Surgeon: Lewayne Bunting, MD;  Location: [facility] ENDOSCOPY;  Service: Cardiovascular;  Laterality: N/A;   Social History   Occupational History  . retired     Social History Main Topics  . Smoking status: Former Smoker    Packs/day: 0.50    Years: 60.00    Types: Cigarettes  . Smokeless tobacco: Never Used     Comment: quit 04-2015  . Alcohol use No  . Drug use: No  . Sexual activity: Not on file

## 2016-07-09 ENCOUNTER — Other Ambulatory Visit: Payer: Self-pay | Admitting: Cardiology

## 2016-07-11 ENCOUNTER — Other Ambulatory Visit: Payer: Self-pay | Admitting: Internal Medicine

## 2016-08-03 ENCOUNTER — Other Ambulatory Visit: Payer: Self-pay | Admitting: Internal Medicine

## 2016-09-03 ENCOUNTER — Other Ambulatory Visit: Payer: Self-pay | Admitting: Cardiology

## 2016-09-17 ENCOUNTER — Telehealth: Payer: Self-pay | Admitting: *Deleted

## 2016-09-17 NOTE — Telephone Encounter (Signed)
AWV  Scheduled with Eber JonesCarolyn @130  09/22/16.

## 2016-09-22 ENCOUNTER — Ambulatory Visit: Payer: Medicare Other | Admitting: Internal Medicine

## 2016-09-27 NOTE — Progress Notes (Addendum)
Subjective:   Sara [name] is a 76 y.o. female who presents for Medicare Annual (Subsequent) preventive examination.  Accompanied by daughter [name]. [name] expresses that she is concerned about her mother's memory.She states that she has noticed a significant decline over the past 1-2 months.   Review of Systems:  No ROS.  Medicare Wellness Visit.  Cardiac Risk Factors include: advanced age (>41men, >68 women);hypertension Sleep patterns: Naps during the day.Sleeps 7-8 hrs per night. Wakes 4-5x to urinate. Feels rested  Home Safety/Smoke Alarms:  Feels safe in home. Smoke alarms in place. Has gas fireplace. Encouraged to get carbon monoxide detectors. Living environment; residence and Firearm Safety: Lives with husband. No stairs. Guns safely stored. Seat Belt Safety/Bike Helmet: Wears seat belt.   Counseling:   Eye Exam- Reading glasses.  Eye doctor only as needed.  Dental- Dentures. Gums care discussed.   Female:   Pap- hysterectomy      Mammo-  Last 10/10/12: BI-RADS CATEGORY 1:  Negative.   Pt declines. Dexa scan-   Last 09/15/15: osteoporosis.  CCS- Pt declines.    Objective:     Vitals: BP 130/74 (BP Location: Right Arm, Patient Position: Sitting, Cuff Size: Normal)   Pulse 65   Ht 5\' 4"  (1.626 m)   Wt 144 lb (65.3 kg)   SpO2 96%   BMI 24.72 kg/m   Body mass index is 24.72 kg/m.   Tobacco History  Smoking Status  . Former Smoker  . Packs/day: 0.50  . Years: 60.00  . Types: Cigarettes  Smokeless Tobacco  . Current User    Comment: quit 04-2015     Ready to quit: No Counseling given: No   Past Medical History:  Diagnosis Date  . Anxiety   . Arthritis   . Bronchitis    hx of  . Carotid artery occlusion   . Chronic pain syndrome    pain management  . Coronary artery disease    sees Dr. Patty Sermons  . GERD (gastroesophageal reflux disease)    hx of  . H/O hiatal hernia   . Hypercholesteremia   . Hypertension    all managed by Dr. Haynes Dage bill  .  Mitral valve regurgitation    trace  . Non-traumatic compression fracture of thoracic vertebra (HCC)    T9  . Stroke [facility])    Past Surgical History:  Procedure Laterality Date  . BACK SURGERY     x 3 Dr Juliene Pina, last @ [facility] ~ 2005, rods, fusion, four surgery  . CARDIAC CATHETERIZATION  1994  . CAROTID ENDARTERECTOMY    . CARPAL TUNNEL RELEASE     left  . CHOLECYSTECTOMY    . ENDARTERECTOMY  04/24/2012   Procedure: ENDARTERECTOMY CAROTID;  Surgeon: Larina Earthly, MD;  Location: [facility] OR;  Service: Vascular;  Laterality: Left;  . HAND SURGERY     bilateral  . HIP FRACTURE SURGERY Left   . HIP PINNING,CANNULATED Left 02/08/2015   Procedure: CANNULATED HIP PINNING;  Surgeon: Kathryne Hitch, MD;  Location: [facility] ORS;  Service: Orthopedics;  Laterality: Left;  . HIP PINNING,CANNULATED Right 03/31/2016   Procedure: CANNULATED HIP PINNING;  Surgeon: Kathryne Hitch, MD;  Location: [facility] ORS;  Service: Orthopedics;  Laterality: Right;  . HYSTEROTOMY    . SPINAL CORD STIMULATOR INSERTION    . TEE WITHOUT CARDIOVERSION  04/12/2012   Procedure: TRANSESOPHAGEAL ECHOCARDIOGRAM (TEE);  Surgeon: Lewayne Bunting, MD;  Location: [facility] ENDOSCOPY;  Service: Cardiovascular;  Laterality: N/A;  Family History  Problem Relation Age of Onset  . Heart attack Father   . Heart disease Father        before age 21  . Heart disease Sister   . Cancer Sister   . Heart disease Sister    History  Sexual Activity  . Sexual activity: No    Outpatient Encounter Prescriptions as of 09/28/2016  Medication Sig  . alendronate (FOSAMAX) 70 MG tablet Take 1 tablet (70 mg total) by mouth every 7 (seven) days. Take with a full glass of water on an empty stomach.  Marland Kitchen amLODipine (NORVASC) 10 MG tablet TAKE 1 TABLET BY MOUTH EVERY DAY  . aspirin 325 MG EC tablet Take 1 tablet (325 mg total) by mouth daily with breakfast. Please take for total of 30 days, then change back to aspirin 81 mg oral daily after 30 days    . atorvastatin (LIPITOR) 40 MG tablet Take 0.5 tablets (20 mg total) by mouth daily.  . diazepam (VALIUM) 5 MG tablet Take 1 tablet (5 mg total) by mouth daily as needed.  Marland Kitchen escitalopram (LEXAPRO) 5 MG tablet Take 1 tablet (5 mg total) by mouth daily.  Marland Kitchen gabapentin (NEURONTIN) 600 MG tablet Take 1 tablet (600 mg total) by mouth 4 (four) times daily.  Marland Kitchen HYDROcodone-acetaminophen (NORCO) 10-325 MG tablet Take 1 tablet by mouth every 4 (four) hours as needed for moderate pain (for pain).  Marland Kitchen HYSINGLA ER 40 MG T24A TK 1 T PO QD  . metoprolol (LOPRESSOR) 50 MG tablet Take 0.5 tablets (25 mg total) by mouth 2 (two) times daily.  Marland Kitchen omeprazole (PRILOSEC) 20 MG capsule Take 1 capsule (20 mg total) by mouth daily.  . vitamin B-12 (CYANOCOBALAMIN) 1000 MCG tablet Take 1,000 mcg by mouth daily.   No facility-administered encounter medications on file as of 09/28/2016.     Activities of Daily Living In your present state of health, do you have any difficulty performing the following activities: 09/28/2016 03/31/2016  Hearing? N -  Vision? N -  Difficulty concentrating or making decisions? Y -  Walking or climbing stairs? Y -  Dressing or bathing? N -  Doing errands, shopping? Y N  Preparing Food and eating ? N -  Using the Toilet? N -  In the past six months, have you accidently leaked urine? N -  Do you have problems with loss of bowel control? N -  Managing your Medications? N -  Managing your Finances? N -  Housekeeping or managing your Housekeeping? Y -  Some recent data might be hidden    Patient Care Team: Wanda Plump, MD as PCP - General (Internal Medicine) Ardell Isaacs, MD as Consulting Physician (Pain Medicine) Cassell Clement, MD as Consulting Physician (Cardiology) Bjorn Pippin, MD as Attending Physician (Urology)    Assessment:    Physical assessment deferred to PCP.  Exercise Activities and Dietary recommendations Current Exercise Habits: The patient does not participate in  regular exercise at present, Exercise limited by: orthopedic condition(s)   Diet (meal preparation, eat out, water intake, caffeinated beverages, dairy products, fruits and vegetables): in general, a "healthy" diet        Goals    None     Fall Risk Fall Risk  09/28/2016 05/24/2016 02/11/2016 01/01/2016 12/12/2015  Falls in the past year? Yes Yes No Yes No  Number falls in past yr: 1 1 - 2 or more -  Injury with Fall? Yes Yes - Yes -  Risk Factor  Category  - High Fall Risk - High Fall Risk -  Risk for fall due to : Impaired balance/gait;Impaired mobility Impaired balance/gait;Impaired mobility - Impaired balance/gait;Impaired mobility -  Risk for fall due to (comments): - - - - -  Follow up Education provided;Falls prevention discussed Falls evaluation completed;Education provided;Falls prevention discussed - Falls evaluation completed;Education provided;Falls prevention discussed -   Depression Screen PHQ 2/9 Scores 09/28/2016 02/11/2016 12/12/2015 02/19/2015  PHQ - 2 Score 1 0 0 0     Cognitive Function MMSE - Mini Mental State Exam 09/28/2016  Orientation to time 5  Orientation to Place 5  Registration 3  Attention/ Calculation 1  Recall 0  Language- name 2 objects 2  Language- repeat 1  Language- follow 3 step command 3  Language- read & follow direction 1  Write a sentence 1  Copy design 0  Total score 22        Immunization History  Administered Date(s) Administered  . Influenza, High Dose Seasonal PF 04/06/2014, 02/11/2016  . Influenza,inj,Quad PF,36+ Mos 01/22/2013, 02/08/2015  . Pneumococcal Conjugate-13 02/19/2015  . Pneumococcal Polysaccharide-23 12/12/2015  . Td 02/11/2016   Screening Tests Health Maintenance  Topic Date Due  . MAMMOGRAM  10/09/2013  . INFLUENZA VACCINE  11/24/2016  . TETANUS/TDAP  02/10/2026  . DEXA SCAN  Completed  . PNA vac Low Risk Adult  Completed      Plan:  Follow up with PCP today as scheduled. Discuss memory concerns. MMSE score  of 22.  Eat heart healthy diet (full of fruits, vegetables, whole grains, lean protein, water--limit salt, fat, and sugar intake) and increase physical activity as tolerated.  Do brain stimulating activities (puzzles, reading, adult coloring books, staying active) to keep memory sharp.   Get carbon monoxide detector for home since you have a gas fireplace.  Check to see if you are using nicotine in your vaping device so that you may decrease and eventually eliminate it.   I have personally reviewed and noted the following in the patient's chart:   . Medical and social history . Use of alcohol, tobacco or illicit drugs  . Current medications and supplements . Functional ability and status . Nutritional status . Physical activity . Advanced directives . List of other physicians . Hospitalizations, surgeries, and ER visits in previous 12 months . Vitals . Screenings to include cognitive, depression, and falls . Referrals and appointments  In addition, I have reviewed and discussed with patient certain preventive protocols, quality metrics, and best practice recommendations. A written personalized care plan for preventive services as well as general preventive health recommendations were provided to patient.     Mady HaagensenBritt, Tita Terhaar [place], [place]  09/28/2016  Willow OraJose Paz, MD

## 2016-09-28 ENCOUNTER — Encounter: Payer: Self-pay | Admitting: Internal Medicine

## 2016-09-28 ENCOUNTER — Ambulatory Visit (INDEPENDENT_AMBULATORY_CARE_PROVIDER_SITE_OTHER): Payer: Medicare Other | Admitting: Internal Medicine

## 2016-09-28 VITALS — BP 130/74 | HR 65 | Ht 64.0 in | Wt 144.0 lb

## 2016-09-28 DIAGNOSIS — F0390 Unspecified dementia without behavioral disturbance: Secondary | ICD-10-CM | POA: Diagnosis not present

## 2016-09-28 DIAGNOSIS — F418 Other specified anxiety disorders: Secondary | ICD-10-CM

## 2016-09-28 DIAGNOSIS — R739 Hyperglycemia, unspecified: Secondary | ICD-10-CM | POA: Diagnosis not present

## 2016-09-28 DIAGNOSIS — D649 Anemia, unspecified: Secondary | ICD-10-CM | POA: Diagnosis not present

## 2016-09-28 DIAGNOSIS — Z0000 Encounter for general adult medical examination without abnormal findings: Secondary | ICD-10-CM

## 2016-09-28 DIAGNOSIS — I10 Essential (primary) hypertension: Secondary | ICD-10-CM | POA: Diagnosis not present

## 2016-09-28 DIAGNOSIS — Z0001 Encounter for general adult medical examination with abnormal findings: Secondary | ICD-10-CM

## 2016-09-28 MED ORDER — DONEPEZIL HCL 5 MG PO TABS: 5.0000 mg | ORAL_TABLET | Freq: Every day | ORAL | 6 refills | Status: DC

## 2016-09-28 MED ORDER — ESCITALOPRAM OXALATE 10 MG PO TABS: 10.0000 mg | ORAL_TABLET | Freq: Every day | ORAL | 4 refills | Status: DC

## 2016-09-28 NOTE — Assessment & Plan Note (Addendum)
--  Td 2017, Pneumovax 2017, Prevnar 2016; Shingles shot discussed, rec shingrex (in back order) --Female care:  Last mammogram 2014, negative, declined another MMG Pap smear-negative 2012 . No h/o abnormal paps or mmg, no further screenings .   --CCS: had a remote cscope, wnl, no records, we agreed no screening

## 2016-09-28 NOTE — Progress Notes (Addendum)
Subjective:    Patient ID: Sara [name], female    DOB: 1941/03/14, 76 y.o.   MRN: [id number]  DOS:  09/28/2016 Type of visit - description : cpx, Here with her daughter Interval history: Main concern is decreased memory, symptoms started after her stroke. She is still able to lives and functions independently. Does some driving. Also reports that she is somewhat depressed, denies any suicidal ideas. She is on a low dose of Lexapro and that helped but is not completely well.  Complaining of allergies mostly itchy nose and eyes.   Review of Systems Specifically denies fever, chills, weight loss. No cough, difficulty breathing. No behavioral issues, does not wander, not angry or belligerent.  No headaches.   Other than above, a 14 point review of systems is negative    Past Medical History:  Diagnosis Date  . Anxiety   . Arthritis   . Bronchitis    hx of  . Carotid artery occlusion   . Chronic pain syndrome    pain management  . Coronary artery disease    sees Dr. Patty Sermons  . GERD (gastroesophageal reflux disease)    hx of  . H/O hiatal hernia   . Hypercholesteremia   . Hypertension    all managed by Dr. Haynes Dage bill  . Mitral valve regurgitation    trace  . Non-traumatic compression fracture of thoracic vertebra (HCC)    T9  . Stroke [facility])     Past Surgical History:  Procedure Laterality Date  . BACK SURGERY     x 3 Dr Juliene Pina, last @ [facility] ~ 2005, rods, fusion, four surgery  . CARDIAC CATHETERIZATION  1994  . CAROTID ENDARTERECTOMY    . CARPAL TUNNEL RELEASE     left  . CHOLECYSTECTOMY    . ENDARTERECTOMY  04/24/2012   Procedure: ENDARTERECTOMY CAROTID;  Surgeon: Larina Earthly, MD;  Location: [facility] OR;  Service: Vascular;  Laterality: Left;  . HAND SURGERY     bilateral  . HIP FRACTURE SURGERY Left   . HIP PINNING,CANNULATED Left 02/08/2015   Procedure: CANNULATED HIP PINNING;  Surgeon: Kathryne Hitch, MD;  Location: [facility] ORS;  Service: Orthopedics;   Laterality: Left;  . HIP PINNING,CANNULATED Right 03/31/2016   Procedure: CANNULATED HIP PINNING;  Surgeon: Kathryne Hitch, MD;  Location: [facility] ORS;  Service: Orthopedics;  Laterality: Right;  . HYSTEROTOMY    . SPINAL CORD STIMULATOR INSERTION    . TEE WITHOUT CARDIOVERSION  04/12/2012   Procedure: TRANSESOPHAGEAL ECHOCARDIOGRAM (TEE);  Surgeon: Lewayne Bunting, MD;  Location: [facility] ENDOSCOPY;  Service: Cardiovascular;  Laterality: N/A;    Social History   Social History  . Marital status: Married    Spouse name: N/A  . Number of children: 4  . Years of education: N/A   Occupational History  . retired     Social History Main Topics  . Smoking status: Former Smoker    Packs/day: 0.50    Years: 60.00    Types: Cigarettes  . Smokeless tobacco: Current User     Comment: quit 04-2015  . Alcohol use No  . Drug use: No  . Sexual activity: No   Other Topics Concern  . Not on file   Social History Narrative   Lives w/ husband in a one story home.     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   Education: high school  Allergies as of 09/28/2016      Reactions   Morphine And Related Rash   Percocet [oxycodone-acetaminophen] Rash   Tolerates APAP (including IV), morphine, and Hydrocodone/APAP   Hydrochlorothiazide    Pt does not remember   Indomethacin    Pt does not remember   Paroxetine Hcl    Pt does not remember   Penicillins    Has patient had a PCN reaction causing immediate rash, facial/tongue/throat swelling, SOB or lightheadedness with hypotension: Yes Has patient had a PCN reaction causing severe rash involving mucus membranes or skin necrosis: No Has patient had a PCN reaction that required hospitalization No Has patient had a PCN reaction occurring within the last 10 years: No If all of the above answers are "NO", then may proceed with Cephalosporin use.   Pravachol    Pt does not remember   Quinine Derivatives    Pt does  not remember   Wellbutrin [bupropion]    agitation   Zocor [simvastatin - High Dose] Other (See Comments)   Weakness/no energy      Medication List       Accurate as of 09/28/16 11:59 PM. Always use your most recent med list.          alendronate 70 MG tablet Commonly known as:  FOSAMAX Take 1 tablet (70 mg total) by mouth every 7 (seven) days. Take with a full glass of water on an empty stomach.   amLODipine 10 MG tablet Commonly known as:  NORVASC TAKE 1 TABLET BY MOUTH EVERY DAY   aspirin 325 MG EC tablet Take 1 tablet (325 mg total) by mouth daily with breakfast. Please take for total of 30 days, then change back to aspirin 81 mg oral daily after 30 days   atorvastatin 40 MG tablet Commonly known as:  LIPITOR Take 0.5 tablets (20 mg total) by mouth daily.   diazepam 5 MG tablet Commonly known as:  VALIUM Take 1 tablet (5 mg total) by mouth daily as needed.   donepezil 5 MG tablet Commonly known as:  ARICEPT Take 1 tablet (5 mg total) by mouth at bedtime.   escitalopram 10 MG tablet Commonly known as:  LEXAPRO Take 1 tablet (10 mg total) by mouth daily.   gabapentin 600 MG tablet Commonly known as:  NEURONTIN Take 1 tablet (600 mg total) by mouth 4 (four) times daily.   HYDROcodone-acetaminophen 10-325 MG tablet Commonly known as:  NORCO Take 1 tablet by mouth every 4 (four) hours as needed for moderate pain (for pain).   HYSINGLA ER 40 MG T24a Generic drug:  HYDROcodone Bitartrate ER TK 1 T PO QD   metoprolol tartrate 50 MG tablet Commonly known as:  LOPRESSOR Take 0.5 tablets (25 mg total) by mouth 2 (two) times daily.   omeprazole 20 MG capsule Commonly known as:  PRILOSEC Take 1 capsule (20 mg total) by mouth daily.   vitamin B-12 1000 MCG tablet Commonly known as:  CYANOCOBALAMIN Take 1,000 mcg by mouth daily.          Objective:   Physical Exam BP 130/74 (BP Location: Right Arm, Patient Position: Sitting, Cuff Size: Normal)   Pulse 65    Ht 5\' 4"  (1.626 m)   Wt 144 lb (65.3 kg)   SpO2 96%   BMI 24.72 kg/m   General:   Well developed, well nourished . NAD.  Neck: No  thyromegaly  HEENT:  Normocephalic . Face symmetric, atraumatic Lungs:  Increased breath sounds Normal  respiratory effort, no intercostal retractions, no accessory muscle use. Heart: RRR,  no murmur.  No pretibial edema bilaterally  Abdomen:  Not distended, soft, non-tender. No rebound or rigidity.  No bruit Skin: Exposed areas without rash. Not pale. Not jaundice Neurologic:  alert & MMSE 22.  Speech normal, gait assisted by a walker, needs help transferring to the table. Strength symmetric and appropriate for age.  Psych: Cognition and judgment appear intact.  Cooperative with normal attention span and concentration.  Behavior appropriate. No anxious or depressed appearing.    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 Depression started Lexapro around 04-2011 CV:Dr. Brackbill --CAD  --H/o Stroke 2013:non hemorrhagic infarcts scattered throughout the [place] . 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, per CT 08/2015, 2 years [facility]: -Osteoporosis  last DEXA -hip fx 01-2015 -Vertebral FX (s/p v-plasty) - R Hip Fx 03-2016 -Chronic back pain , s/p spinal cord stimulator  Dr Jordan LikesSpivey   Abnormal CT abd/pelvis  2017: See full report, some of the findings>> Atherosclerotic ulcer left subclavian T 8 compression fracture Infrarenal abdominal aortic aneurysm 3.5 cm, follow-up 2 years Narrow  left renal artery , SMA occluded with pancreatic collaterals Mild mesenteric adenopathy, possibly stable, consider 2 year follow-up    R distal ureter enhancement per CT: Saw urology 07-2015, Rx a ultrasound   R adrenal adenoma Smoker , quit ~ 04-2015  Urosepsis 05-2015   PLAN: Prediabetes: Diet control, recheck A1c HTN: Seems well-controlled on amlodipine and Lopressor. Check a CMP Hyperlipidemia: On Lipitor, controlled. Anxiety depression: needs better control, increase Lexapro to 10 mg. Very seldom takes Valium. 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 We talk about pros-cons-s/e of medication. Will start a low-dose of Aricept and try to improve depression control. Check RPR, TSH. Previous folic acid and B12 normal. Anemia: Recent CBC  stable, recheck a CBC, iron and ferritin Osteoporosis: Start a fosamax, no apparent side effects. Cardiovascular: Does not see cardiology regularly, no symptoms, goal is to control CV RF Carotid disease follow-up by vascular surgery. Sees neurology regular, next visit 01-2017 OTC 2 months

## 2016-09-28 NOTE — Patient Instructions (Addendum)
GO TO THE LAB : Get the blood work     GO TO THE FRONT DESK Schedule your next appointment for a  checkup in 2 months  increased Lexapro to 10 mg one tablet daily  Start Aricept 5 mg one tablet a day.    Sara [name] , Thank you for taking time to come for your Medicare Wellness Visit. I appreciate your ongoing commitment to your health goals. Please review the following plan we discussed and let me know if I can assist you in the future.   These are the goals we discussed: Goals    None      This is a list of the screening recommended for you and due dates:  Health Maintenance  Topic Date Due  . Mammogram  10/09/2013  . Flu Shot  11/24/2016  . Tetanus Vaccine  02/10/2026  . DEXA scan (bone density measurement)  Completed  . Pneumonia vaccines  Completed   Eat heart healthy diet (full of fruits, vegetables, whole grains, lean protein, water--limit salt, fat, and sugar intake) and increase physical activity as tolerated.  Do brain stimulating activities (puzzles, reading, adult coloring books, staying active) to keep memory sharp.   Get carbon monoxide detector for home since you have a gas fireplace.  Check to see if you are using nicotine in your vaping device so that you may decrease and eventually eliminate it. Health Maintenance for Postmenopausal Women Menopause is a normal process in which your reproductive ability comes to an end. This process happens gradually over a span of months to years, usually between the ages of 64 and 14. Menopause is complete when you have missed 12 consecutive menstrual periods. It is important to talk with your health care provider about some of the most common conditions that affect postmenopausal women, such as heart disease, cancer, and bone loss (osteoporosis). Adopting a healthy lifestyle and getting preventive care can help to promote your health and wellness. Those actions can also lower your chances of developing some of these common  conditions. What should I know about menopause? During menopause, you may experience a number of symptoms, such as:  Moderate-to-severe hot flashes.  Night sweats.  Decrease in sex drive.  Mood swings.  Headaches.  Tiredness.  Irritability.  Memory problems.  Insomnia.  Choosing to treat or not to treat menopausal changes is an individual decision that you make with your health care provider. What should I know about hormone replacement therapy and supplements? Hormone therapy products are effective for treating symptoms that are associated with menopause, such as hot flashes and night sweats. Hormone replacement carries certain risks, especially as you become older. If you are thinking about using estrogen or estrogen with progestin treatments, discuss the benefits and risks with your health care provider. What should I know about heart disease and stroke? Heart disease, heart attack, and stroke become more likely as you age. This may be due, in part, to the hormonal changes that your body experiences during menopause. These can affect how your body processes dietary fats, triglycerides, and cholesterol. Heart attack and stroke are both medical emergencies. There are many things that you can do to help prevent heart disease and stroke:  Have your blood pressure checked at least every 1-2 years. High blood pressure causes heart disease and increases the risk of stroke.  If you are 48-85 years old, ask your health care provider if you should take aspirin to prevent a heart attack or a stroke.  Do  not use any tobacco products, including cigarettes, chewing tobacco, or electronic cigarettes. If you need help quitting, ask your health care provider.  It is important to eat a healthy diet and maintain a healthy weight. ? Be sure to include plenty of vegetables, fruits, low-fat dairy products, and lean protein. ? Avoid eating foods that are high in solid fats, added sugars, or salt  (sodium).  Get regular exercise. This is one of the most important things that you can do for your health. ? Try to exercise for at least 150 minutes each week. The type of exercise that you do should increase your heart rate and make you sweat. This is known as moderate-intensity exercise. ? Try to do strengthening exercises at least twice each week. Do these in addition to the moderate-intensity exercise.  Know your numbers.Ask your health care provider to check your cholesterol and your blood glucose. Continue to have your blood tested as directed by your health care provider.  What should I know about cancer screening? There are several types of cancer. Take the following steps to reduce your risk and to catch any cancer development as early as possible. Breast Cancer  Practice breast self-awareness. ? This means understanding how your breasts normally appear and feel. ? It also means doing regular breast self-exams. Let your health care provider know about any changes, no matter how small.  If you are 23 or older, have a clinician do a breast exam (clinical breast exam or CBE) every year. Depending on your age, family history, and medical history, it may be recommended that you also have a yearly breast X-ray (mammogram).  If you have a family history of breast cancer, talk with your health care provider about genetic screening.  If you are at high risk for breast cancer, talk with your health care provider about having an MRI and a mammogram every year.  Breast cancer (BRCA) gene test is recommended for women who have family members with BRCA-related cancers. Results of the assessment will determine the need for genetic counseling and BRCA1 and for BRCA2 testing. BRCA-related cancers include these types: ? Breast. This occurs in males or females. ? Ovarian. ? Tubal. This may also be called fallopian tube cancer. ? Cancer of the abdominal or pelvic lining (peritoneal  cancer). ? Prostate. ? Pancreatic.  Cervical, Uterine, and Ovarian Cancer Your health care provider may recommend that you be screened regularly for cancer of the pelvic organs. These include your ovaries, uterus, and vagina. This screening involves a pelvic exam, which includes checking for microscopic changes to the surface of your cervix (Pap test).  For women ages 21-65, health care providers may recommend a pelvic exam and a Pap test every three years. For women ages 69-65, they may recommend the Pap test and pelvic exam, combined with testing for human papilloma virus (HPV), every five years. Some types of HPV increase your risk of cervical cancer. Testing for HPV may also be done on women of any age who have unclear Pap test results.  Other health care providers may not recommend any screening for nonpregnant women who are considered low risk for pelvic cancer and have no symptoms. Ask your health care provider if a screening pelvic exam is right for you.  If you have had past treatment for cervical cancer or a condition that could lead to cancer, you need Pap tests and screening for cancer for at least 20 years after your treatment. If Pap tests have been discontinued for  you, your risk factors (such as having a new sexual partner) need to be reassessed to determine if you should start having screenings again. Some women have medical problems that increase the chance of getting cervical cancer. In these cases, your health care provider may recommend that you have screening and Pap tests more often.  If you have a family history of uterine cancer or ovarian cancer, talk with your health care provider about genetic screening.  If you have vaginal bleeding after reaching menopause, tell your health care provider.  There are currently no reliable tests available to screen for ovarian cancer.  Lung Cancer Lung cancer screening is recommended for adults 19-39 years old who are at high risk for  lung cancer because of a history of smoking. A yearly low-dose CT scan of the lungs is recommended if you:  Currently smoke.  Have a history of at least 30 pack-years of smoking and you currently smoke or have quit within the past 15 years. A pack-year is smoking an average of one pack of cigarettes per day for one year.  Yearly screening should:  Continue until it has been 15 years since you quit.  Stop if you develop a health problem that would prevent you from having lung cancer treatment.  Colorectal Cancer  This type of cancer can be detected and can often be prevented.  Routine colorectal cancer screening usually begins at age 41 and continues through age 17.  If you have risk factors for colon cancer, your health care provider may recommend that you be screened at an earlier age.  If you have a family history of colorectal cancer, talk with your health care provider about genetic screening.  Your health care provider may also recommend using home test kits to check for hidden blood in your stool.  A small camera at the end of a tube can be used to examine your colon directly (sigmoidoscopy or colonoscopy). This is done to check for the earliest forms of colorectal cancer.  Direct examination of the colon should be repeated every 5-10 years until age 5. However, if early forms of precancerous polyps or small growths are found or if you have a family history or genetic risk for colorectal cancer, you may need to be screened more often.  Skin Cancer  Check your skin from head to toe regularly.  Monitor any moles. Be sure to tell your health care provider: ? About any new moles or changes in moles, especially if there is a change in a mole's shape or color. ? If you have a mole that is larger than the size of a pencil eraser.  If any of your family members has a history of skin cancer, especially at a young age, talk with your health care provider about genetic  screening.  Always use sunscreen. Apply sunscreen liberally and repeatedly throughout the day.  Whenever you are outside, protect yourself by wearing long sleeves, pants, a wide-brimmed hat, and sunglasses.  What should I know about osteoporosis? Osteoporosis is a condition in which bone destruction happens more quickly than new bone creation. After menopause, you may be at an increased risk for osteoporosis. To help prevent osteoporosis or the bone fractures that can happen because of osteoporosis, the following is recommended:  If you are 74-40 years old, get at least 1,000 mg of calcium and at least 600 mg of vitamin D per day.  If you are older than age 81 but younger than age 1, get at  least 1,200 mg of calcium and at least 600 mg of vitamin D per day.  If you are older than age 22, get at least 1,200 mg of calcium and at least 800 mg of vitamin D per day.  Smoking and excessive alcohol intake increase the risk of osteoporosis. Eat foods that are rich in calcium and vitamin D, and do weight-bearing exercises several times each week as directed by your health care provider. What should I know about how menopause affects my mental health? Depression may occur at any age, but it is more common as you become older. Common symptoms of depression include:  Low or sad mood.  Changes in sleep patterns.  Changes in appetite or eating patterns.  Feeling an overall lack of motivation or enjoyment of activities that you previously enjoyed.  Frequent crying spells.  Talk with your health care provider if you think that you are experiencing depression. What should I know about immunizations? It is important that you get and maintain your immunizations. These include:  Tetanus, diphtheria, and pertussis (Tdap) booster vaccine.  Influenza every year before the flu season begins.  Pneumonia vaccine.  Shingles vaccine.  Your health care provider may also recommend other  immunizations. This information is not intended to replace advice given to you by your health care provider. Make sure you discuss any questions you have with your health care provider. Document Released: 06/04/2005 Document Revised: 10/31/2015 Document Reviewed: 01/14/2015 Elsevier Interactive Patient Education  2018 [employer].

## 2016-09-29 LAB — CBC WITH DIFFERENTIAL/PLATELET
BASOS PCT: 0.7 % (ref 0.0–3.0)
Basophils Absolute: 0 10*3/uL (ref 0.0–0.1)
EOS ABS: 0.1 10*3/uL (ref 0.0–0.7)
EOS PCT: 1.3 % (ref 0.0–5.0)
HEMATOCRIT: 38.4 % (ref 36.0–46.0)
HEMOGLOBIN: 12.4 g/dL (ref 12.0–15.0)
LYMPHS PCT: 36.9 % (ref 12.0–46.0)
Lymphs Abs: 2.5 10*3/uL (ref 0.7–4.0)
MCHC: 32.2 g/dL (ref 30.0–36.0)
MCV: 89.1 fl (ref 78.0–100.0)
MONOS PCT: 11.4 % (ref 3.0–12.0)
Monocytes Absolute: 0.8 10*3/uL (ref 0.1–1.0)
Neutro Abs: 3.4 10*3/uL (ref 1.4–7.7)
Neutrophils Relative %: 49.7 % (ref 43.0–77.0)
Platelets: 253 10*3/uL (ref 150.0–400.0)
RBC: 4.31 Mil/uL (ref 3.87–5.11)
RDW: 14.7 % (ref 11.5–15.5)
WBC: 6.8 10*3/uL (ref 4.0–10.5)

## 2016-09-29 LAB — RPR

## 2016-09-29 LAB — COMPREHENSIVE METABOLIC PANEL
ALT: 10 U/L (ref 0–35)
AST: 15 U/L (ref 0–37)
Albumin: 3.5 g/dL (ref 3.5–5.2)
Alkaline Phosphatase: 86 U/L (ref 39–117)
BUN: 23 mg/dL (ref 6–23)
CALCIUM: 9.2 mg/dL (ref 8.4–10.5)
CHLORIDE: 104 meq/L (ref 96–112)
CO2: 29 meq/L (ref 19–32)
Creatinine, Ser: 0.96 mg/dL (ref 0.40–1.20)
GFR: 59.68 mL/min — AB (ref >60.00)
Glucose, Bld: 107 mg/dL — ABNORMAL HIGH (ref 70–99)
POTASSIUM: 4.3 meq/L (ref 3.5–5.1)
Sodium: 140 mEq/L (ref 135–145)
Total Bilirubin: 0.6 mg/dL (ref 0.2–1.2)
Total Protein: 6.7 g/dL (ref 6.0–8.3)

## 2016-09-29 LAB — IRON: Iron: 20 ug/dL — ABNORMAL LOW (ref 42–145)

## 2016-09-29 LAB — FERRITIN: FERRITIN: 39.6 ng/mL (ref 10.0–291.0)

## 2016-09-29 LAB — TSH: TSH: 0.63 u[IU]/mL (ref 0.35–4.50)

## 2016-09-29 LAB — HEMOGLOBIN A1C: Hgb A1c MFr Bld: 6.1 % (ref 4.6–6.5)

## 2016-09-29 NOTE — Assessment & Plan Note (Signed)
Prediabetes: Diet control, recheck A1c HTN: Seems well-controlled on amlodipine and Lopressor. Check a CMP Hyperlipidemia: On Lipitor, controlled. Anxiety depression: needs better control, increase Lexapro to 10 mg. Very seldom takes Valium. 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 We talk about pros-cons-s/e of medication. Will start a low-dose of Aricept and try to improve depression control. Check RPR, TSH. Previous folic acid and B12 normal. Anemia: Recent CBC  stable, recheck a CBC, iron and ferritin Osteoporosis: Start a fosamax, no apparent side effects. Cardiovascular: Does not see cardiology regularly, no symptoms, goal is to control CV RF Carotid disease follow-up by vascular surgery. Sees neurology regular, next visit 01-2017 OTC 2 months

## 2016-10-21 ENCOUNTER — Telehealth: Payer: Self-pay

## 2016-10-21 MED ORDER — DIAZEPAM 5 MG PO TABS: 5.0000 mg | ORAL_TABLET | Freq: Every day | ORAL | 0 refills | Status: DC | PRN

## 2016-10-21 NOTE — Telephone Encounter (Signed)
[facility]

## 2016-10-21 NOTE — Telephone Encounter (Signed)
See rx. 

## 2016-10-21 NOTE — Telephone Encounter (Signed)
[name] is requesting refill on diazepam 5mg  tabs. Sara [name].  Last OV: 09/28/2016 Last Fill: 04/08/2016 #30 and 2RF UDS: None, [name] seen by pain management   Please advise.

## 2016-10-29 ENCOUNTER — Other Ambulatory Visit: Payer: Self-pay | Admitting: Internal Medicine

## 2016-11-17 ENCOUNTER — Other Ambulatory Visit: Payer: Self-pay | Admitting: Internal Medicine

## 2016-11-29 ENCOUNTER — Ambulatory Visit (INDEPENDENT_AMBULATORY_CARE_PROVIDER_SITE_OTHER): Payer: Medicare Other | Admitting: Internal Medicine

## 2016-11-29 ENCOUNTER — Encounter: Payer: Self-pay | Admitting: Internal Medicine

## 2016-11-29 VITALS — BP 124/72 | HR 74 | Temp 98.2°F | Resp 14 | Ht 64.0 in | Wt 144.4 lb

## 2016-11-29 DIAGNOSIS — F329 Major depressive disorder, single episode, unspecified: Secondary | ICD-10-CM

## 2016-11-29 DIAGNOSIS — F419 Anxiety disorder, unspecified: Secondary | ICD-10-CM | POA: Diagnosis not present

## 2016-11-29 DIAGNOSIS — F0390 Unspecified dementia without behavioral disturbance: Secondary | ICD-10-CM | POA: Diagnosis not present

## 2016-11-29 NOTE — Progress Notes (Signed)
Pre visit review using our clinic review tool, if applicable. No additional management support is needed unless otherwise documented below in the visit note. 

## 2016-11-29 NOTE — Patient Instructions (Signed)
  GO TO THE FRONT DESK Schedule your next appointment for a  checkup in 4 months   For itching: -Don't take donepezil ( Aricept)  for 6 weeks, then go back on it. See if that affects the itching -No hot baths  -Use Aveeno lotion daily

## 2016-11-29 NOTE — Progress Notes (Signed)
Subjective:    Patient ID: Sara [name], female    DOB: 07/24/1940, 76 y.o.   MRN: [id number]  DOS:  11/29/2016 Type of visit - description : f/u, Here with her daughter Inetta Fermoina Interval history: Dementia: Taking Aricept, memory is about the same Depression: Increased dose of Lexapro, she feels somewhat better. She actually lost a brother today, condolences provided.   Review of Systems Reports generalized itching, points to mostly legs, chest and back. Does not know exactly when it started, for the last couple of months?. Denies lip or tongue swelling, has whelps from time to time?  Past Medical History:  Diagnosis Date  . Anxiety   . Arthritis   . Bronchitis    hx of  . Carotid artery occlusion   . Chronic pain syndrome    pain management  . Coronary artery disease    sees Dr. Patty SermonsBrackbill  . GERD (gastroesophageal reflux disease)    hx of  . H/O hiatal hernia   . Hypercholesteremia   . Hypertension    all managed by Dr. Haynes DageBrack bill  . Mitral valve regurgitation    trace  . Non-traumatic compression fracture of thoracic vertebra (HCC)    T9  . Stroke [facility](HCC)     Past Surgical History:  Procedure Laterality Date  . BACK SURGERY     x 3 Dr Juliene PinaGeoffrey, [facility], rods, fusion, four surgery  . CARDIAC CATHETERIZATION  1994  . CAROTID ENDARTERECTOMY    . CARPAL TUNNEL RELEASE     left  . CHOLECYSTECTOMY    . ENDARTERECTOMY  04/24/2012   Procedure: ENDARTERECTOMY CAROTID;  Surgeon: Larina Earthlyodd F Early, MD;  [facility];  Service: Vascular;  Laterality: Left;  . HAND SURGERY     bilateral  . HIP FRACTURE SURGERY Left   . HIP PINNING,CANNULATED Left 02/08/2015   Procedure: CANNULATED HIP PINNING;  Surgeon: Kathryne Hitchhristopher Y Blackman, MD;  Location: [facility] ORS;  Service: Orthopedics;  Laterality: Left;  . HIP PINNING,CANNULATED Right 03/31/2016   Procedure: CANNULATED HIP PINNING;  Surgeon: Kathryne Hitchhristopher Y Blackman, MD;  Location: [facility] ORS;  Service: Orthopedics;  Laterality:  Right;  . HYSTEROTOMY    . SPINAL CORD STIMULATOR INSERTION    . TEE WITHOUT CARDIOVERSION  04/12/2012   Procedure: TRANSESOPHAGEAL ECHOCARDIOGRAM (TEE);  Surgeon: Lewayne BuntingBrian S Crenshaw, MD;  [facility];  Service: Cardiovascular;  Laterality: N/A;    Social History   Social History  . Marital status: Married    Spouse name: N/A  . Number of children: 4  . Years of education: N/A   Occupational History  . retired     Social History Main Topics  . Smoking status: Former Smoker    Packs/day: 0.50    Years: 60.00    Types: Cigarettes  . Smokeless tobacco: Current User     Comment: quit 04-2015  . Alcohol use No  . Drug use: No  . Sexual activity: No   Other Topics Concern  . Not on file   Social History Narrative   Lives w/ husband in a one story home.     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   Education: high school      Allergies as of 11/29/2016      Reactions   Morphine And Related Rash   Percocet [oxycodone-acetaminophen] Rash   Tolerates APAP (including IV), morphine, and Hydrocodone/APAP   Hydrochlorothiazide  Pt does not remember   Indomethacin    Pt does not remember   Paroxetine Hcl    Pt does not remember   Penicillins    Has patient had a PCN reaction causing immediate rash, facial/tongue/throat swelling, SOB or lightheadedness with hypotension: Yes Has patient had a PCN reaction causing severe rash involving mucus membranes or skin necrosis: No Has patient had a PCN reaction that required hospitalization No Has patient had a PCN reaction occurring within the last 10 years: No If all of the above answers are "NO", then may proceed with Cephalosporin use.   Pravachol    Pt does not remember   Quinine Derivatives    Pt does not remember   Wellbutrin [bupropion]    agitation   Zocor [simvastatin - High Dose] Other (See Comments)   Weakness/no energy      Medication List       Accurate as of 11/29/16 11:59  PM. Always use your most recent med list.          alendronate 70 MG tablet Commonly known as:  FOSAMAX Take 1 tablet (70 mg total) by mouth every 7 (seven) days. Take with a full glass of water on an empty stomach.   amLODipine 10 MG tablet Commonly known as:  NORVASC Take 1 tablet (10 mg total) by mouth daily.   aspirin 325 MG EC tablet Take 1 tablet (325 mg total) by mouth daily with breakfast. Please take for total of 30 days, then change back to aspirin 81 mg oral daily after 30 days   atorvastatin 40 MG tablet Commonly known as:  LIPITOR Take 0.5 tablets (20 mg total) by mouth daily.   diazepam 5 MG tablet Commonly known as:  VALIUM Take 1 tablet (5 mg total) by mouth daily as needed.   donepezil 5 MG tablet Commonly known as:  ARICEPT Take 1 tablet (5 mg total) by mouth at bedtime.   escitalopram 5 MG tablet Commonly known as:  LEXAPRO Take 1 tablet (5 mg total) by mouth daily.   gabapentin 600 MG tablet Commonly known as:  NEURONTIN Take 1 tablet (600 mg total) by mouth 4 (four) times daily.   HYDROcodone-acetaminophen 10-325 MG tablet Commonly known as:  NORCO Take 1 tablet by mouth every 4 (four) hours as needed for moderate pain (for pain).   HYSINGLA ER 40 MG T24a Generic drug:  HYDROcodone Bitartrate ER TK 1 T PO QD   metoprolol tartrate 50 MG tablet Commonly known as:  LOPRESSOR Take 0.5 tablets (25 mg total) by mouth 2 (two) times daily.   omeprazole 20 MG capsule Commonly known as:  PRILOSEC Take 1 capsule (20 mg total) by mouth daily.   vitamin B-12 1000 MCG tablet Commonly known as:  CYANOCOBALAMIN Take 1,000 mcg by mouth daily.          Objective:   Physical Exam BP 124/72 (BP Location: Left Arm, Patient Position: Sitting, Cuff Size: Small)   Pulse 74   Temp 98.2 F (36.8 C) (Oral)   Resp 14   Ht 5\' 4"  (1.626 m)   Wt 144 lb 6 oz (65.5 kg)   SpO2 94%   BMI 24.78 kg/m  General:   Well developed, well nourished . NAD.  HEENT:    Normocephalic . Face symmetric, atraumatic Lungs:  CTA B Normal respiratory effort, no intercostal retractions, no accessory muscle use. Heart: RRR,  no murmur.  No pretibial edema bilaterally  Skin: Not pale. Not jaundice. Skin  is dry but no rash or whelps Neurologic:  alert & oriented X3.  Speech normal, gait assisted by a cane, has at least moderate difficulty, uses a back brace. Psych--  Cognition and judgment appear intact.  Cooperative with normal attention span and concentration.  Behavior appropriate. 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 Depression started Lexapro around 04-2011 CV:Dr. Brackbill --CAD  --H/o Stroke 2013:non hemorrhagic infarcts scattered throughout the [place] . 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 No apparent s/e From Aricept but c/o   generalized itching, occasional whelps; skin today is normal with no rash. Will ask patient to hold Aricept for 6 weeks then go back on it, will see if Aricept is the culprit of the symptoms. Generalized itching: See above, she has dry skin on exam, recommend to avoid hot baths and use Aveeno. Anxiety depression: Seems better controlled with increased dose of Lexapro. RTC 3-4 months.

## 2016-11-30 NOTE — Assessment & Plan Note (Signed)
Mild dementia: Started Aricept, memory stable, unchan]ged. No apparent s/e From Aricept but c/o  generalized itching, occasional whelps; skin today is normal with no rash. Will ask patient to hold Aricept for 6 weeks then go back on it, will see if Aricept is the culprit of the symptoms. Generalized itching: See above, she has dry skin on exam, recommend to avoid hot baths and use Aveeno. Anxiety depression: Seems better controlled with increased dose of Lexapro. RTC 3-4 months.

## 2016-12-17 ENCOUNTER — Ambulatory Visit: Payer: Medicare Other | Admitting: Family Medicine

## 2016-12-30 ENCOUNTER — Other Ambulatory Visit: Payer: Self-pay | Admitting: Family

## 2016-12-31 NOTE — Telephone Encounter (Signed)
Your patient.  Thanks 

## 2017-01-03 NOTE — Telephone Encounter (Signed)
Pt is requesting refill on diazepam 5mg .  Last OV: 11/29/2016 Last Fill: 10/21/2016 #30 and 0RF by Efraim KaufmannMelissa UDS: None, Pt under contract by [facility]  [place] database printed;   Hysingla ER 40mg  tabs and Hydrocodone 10-325mg  tabs prescribed regularly by Dr. Orvan Falconerampbell at [facility]; no other issues noted   Please advise.

## 2017-01-03 NOTE — Telephone Encounter (Signed)
Rx faxed to [facility] pharmacy.  

## 2017-01-03 NOTE — Telephone Encounter (Signed)
Rx printed, awaiting MD signature.  

## 2017-01-03 NOTE — Telephone Encounter (Signed)
Okay #30 and 2 refills for diazepam

## 2017-01-15 ENCOUNTER — Other Ambulatory Visit: Payer: Self-pay | Admitting: Internal Medicine

## 2017-02-21 ENCOUNTER — Ambulatory Visit: Payer: Medicare Other | Admitting: Neurology

## 2017-03-30 ENCOUNTER — Ambulatory Visit (INDEPENDENT_AMBULATORY_CARE_PROVIDER_SITE_OTHER): Payer: Medicare Other | Admitting: Internal Medicine

## 2017-03-30 ENCOUNTER — Encounter: Payer: Self-pay | Admitting: Internal Medicine

## 2017-03-30 VITALS — BP 126/78 | HR 55 | Temp 97.9°F | Resp 14 | Ht 64.0 in | Wt 143.1 lb

## 2017-03-30 DIAGNOSIS — E7800 Pure hypercholesterolemia, unspecified: Secondary | ICD-10-CM | POA: Diagnosis not present

## 2017-03-30 DIAGNOSIS — N289 Disorder of kidney and ureter, unspecified: Secondary | ICD-10-CM | POA: Diagnosis not present

## 2017-03-30 DIAGNOSIS — I10 Essential (primary) hypertension: Secondary | ICD-10-CM | POA: Diagnosis not present

## 2017-03-30 NOTE — Progress Notes (Signed)
Pre visit review using our clinic review tool, if applicable. No additional management support is needed unless otherwise documented below in the visit note. 

## 2017-03-30 NOTE — Progress Notes (Signed)
Subjective:    Patient ID: Sara [name], female    DOB: 03/02/1941, 76 y.o.   MRN: [id number]  DOS:  03/30/2017 Type of visit - description : rov, here w/ her daughter  Interval history: In general feeling well. 2 weeks ago did have some nausea, vomiting or diarrhea.  Last week only diarrhea and now feels better. Still live independently with her husband, does not drive. Good compliance with medication Ambulatory BPs normal   Review of Systems Denies fever chills No chest pain or difficulty breathing No blood in the stools. Did have ill-defined abdominal discomfort when she had diarrhea.  Past Medical History:  Diagnosis Date  . Anxiety   . Arthritis   . Bronchitis    hx of  . Carotid artery occlusion   . Chronic pain syndrome    pain management  . Coronary artery disease    sees Dr. Patty SermonsBrackbill  . GERD (gastroesophageal reflux disease)    hx of  . H/O hiatal hernia   . Hypercholesteremia   . Hypertension    all managed by Dr. Haynes DageBrack bill  . Mitral valve regurgitation    trace  . Non-traumatic compression fracture of thoracic vertebra (HCC)    T9  . Stroke [facility](HCC)     Past Surgical History:  Procedure Laterality Date  . BACK SURGERY     x 3 Dr Juliene PinaGeoffrey, [facility], rods, fusion, four surgery  . CARDIAC CATHETERIZATION  1994  . CAROTID ENDARTERECTOMY    . CARPAL TUNNEL RELEASE     left  . CHOLECYSTECTOMY    . ENDARTERECTOMY  04/24/2012   Procedure: ENDARTERECTOMY CAROTID;  Surgeon: Larina Earthlyodd F Early, MD;  [facility];  Service: Vascular;  Laterality: Left;  . HAND SURGERY     bilateral  . HIP FRACTURE SURGERY Left   . HIP PINNING,CANNULATED Left 02/08/2015   Procedure: CANNULATED HIP PINNING;  Surgeon: Kathryne Hitchhristopher Y Blackman, MD;  Location: [facility] ORS;  Service: Orthopedics;  Laterality: Left;  . HIP PINNING,CANNULATED Right 03/31/2016   Procedure: CANNULATED HIP PINNING;  Surgeon: Kathryne Hitchhristopher Y Blackman, MD;  Location: [facility] ORS;  Service: Orthopedics;   Laterality: Right;  . HYSTEROTOMY    . SPINAL CORD STIMULATOR INSERTION    . TEE WITHOUT CARDIOVERSION  04/12/2012   Procedure: TRANSESOPHAGEAL ECHOCARDIOGRAM (TEE);  Surgeon: Lewayne BuntingBrian S Crenshaw, MD;  [facility];  Service: Cardiovascular;  Laterality: N/A;    Social History   Socioeconomic History  . Marital status: Married    Spouse name: Not on file  . Number of children: 4  . Years of education: Not on file  . Highest education level: Not on file  Social Needs  . Financial resource strain: Not on file  . Food insecurity - worry: Not on file  . Food insecurity - inability: Not on file  . Transportation needs - medical: Not on file  . Transportation needs - non-medical: Not on file  Occupational History  . Occupation: retired   Tobacco Use  . Smoking status: Former Smoker    Packs/day: 0.50    Years: 60.00    Pack years: 30.00    Types: Cigarettes  . Smokeless tobacco: Current User  . Tobacco comment: quit 04-2015  Substance and Sexual Activity  . Alcohol use: No    Alcohol/week: 0.0 oz  . Drug use: No  . Sexual activity: No  Other Topics Concern  . Not on file  Social History Narrative   Lives w/  husband in a one story home.     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   Education: high school      Allergies as of 03/30/2017      Reactions   Morphine And Related Rash   Percocet [oxycodone-acetaminophen] Rash   Tolerates APAP (including IV), morphine, and Hydrocodone/APAP   Hydrochlorothiazide    Pt does not remember   Indomethacin    Pt does not remember   Paroxetine Hcl    Pt does not remember   Penicillins    Has patient had a PCN reaction causing immediate rash, facial/tongue/throat swelling, SOB or lightheadedness with hypotension: Yes Has patient had a PCN reaction causing severe rash involving mucus membranes or skin necrosis: No Has patient had a PCN reaction that required hospitalization No Has patient had a  PCN reaction occurring within the last 10 years: No If all of the above answers are "NO", then may proceed with Cephalosporin use.   Pravachol    Pt does not remember   Quinine Derivatives    Pt does not remember   Wellbutrin [bupropion]    agitation   Zocor [simvastatin - High Dose] Other (See Comments)   Weakness/no energy      Medication List        Accurate as of 03/30/17 11:59 PM. Always use your most recent med list.          alendronate 70 MG tablet Commonly known as:  FOSAMAX Take 1 tablet (70 mg total) by mouth every 7 (seven) days. Take with a full glass of water on an empty stomach.   amLODipine 10 MG tablet Commonly known as:  NORVASC Take 1 tablet (10 mg total) by mouth daily.   aspirin 325 MG EC tablet Take 1 tablet (325 mg total) by mouth daily with breakfast. Please take for total of 30 days, then change back to aspirin 81 mg oral daily after 30 days   atorvastatin 40 MG tablet Commonly known as:  LIPITOR Take 0.5 tablets (20 mg total) by mouth daily.   diazepam 5 MG tablet Commonly known as:  VALIUM Take 1 tablet (5 mg total) by mouth daily as needed.   donepezil 5 MG tablet Commonly known as:  ARICEPT Take 1 tablet (5 mg total) by mouth at bedtime.   escitalopram 5 MG tablet Commonly known as:  LEXAPRO Take 1 tablet (5 mg total) by mouth daily.   gabapentin 600 MG tablet Commonly known as:  NEURONTIN Take 1 tablet (600 mg total) by mouth 4 (four) times daily.   HYDROcodone-acetaminophen 10-325 MG tablet Commonly known as:  NORCO Take 1 tablet by mouth every 4 (four) hours as needed for moderate pain (for pain).   HYSINGLA ER 40 MG T24a Generic drug:  HYDROcodone Bitartrate ER TK 1 T PO QD   metoprolol tartrate 50 MG tablet Commonly known as:  LOPRESSOR Take 0.5 tablets (25 mg total) by mouth 2 (two) times daily.   omeprazole 20 MG capsule Commonly known as:  PRILOSEC Take 1 capsule (20 mg total) by mouth daily.   vitamin B-12 1000  MCG tablet Commonly known as:  CYANOCOBALAMIN Take 1,000 mcg by mouth daily.          Objective:   Physical Exam BP 126/78 (BP Location: Left Arm, Patient Position: Sitting, Cuff Size: Small)   Pulse (!) 55   Temp 97.9 F (36.6 C) (Oral)   Resp  14   Ht 5\' 4"  (1.626 m)   Wt 143 lb 2 oz (64.9 kg)   SpO2 98%   BMI 24.57 kg/m  General:   Well developed, well nourished . NAD.  HEENT:  Normocephalic . Face symmetric, atraumatic Lungs:  CTA B Normal respiratory effort, no intercostal retractions, no accessory muscle use. Heart: RRR,  no murmur.  No pretibial edema bilaterally  Skin: Not pale. Not jaundice Neurologic:  alert & oriented X3.  Speech normal, gait appropriate for age and unassisted Psych--  Cognition and judgment appear intact.  Cooperative with normal attention span and concentration.  Behavior appropriate. No anxious or depressed appearing.      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 Depression started Lexapro around 04-2011 CV:Dr. Brackbill --CAD  --H/o Stroke 2013:non hemorrhagic infarcts scattered throughout the [place] . 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 Hyperlipidemia: On Lipitor, not fasting, will check a lipid panel Anxiety, depression, dementia: doing very good per patient's daughter.  Continue Valium, Aricept, Lexapro. Gastroenteritis?  Had GI sxs recently, now back to normal, if she has sxs again recommend conservative treatment , see AVS Generalized itching: See last visit, sxs not related to Aricept, resolved. RTC 5 months

## 2017-03-30 NOTE — Patient Instructions (Signed)
GO TO THE LAB : Get the blood work     GO TO THE FRONT DESK Schedule your next appointment for a checkup in 5 months.  If you have some nausea or diarrhea: Keep yourself hydrated Emetrol OTC for now she is okay Pepto-Bismol OTC for diarrhea is okay.  It will turn your stools dark Call if you have severe symptoms, fever, chills.

## 2017-03-31 LAB — BASIC METABOLIC PANEL
BUN: 22 mg/dL (ref 6–23)
CHLORIDE: 106 meq/L (ref 96–112)
CO2: 29 meq/L (ref 19–32)
CREATININE: 1.24 mg/dL — AB (ref 0.40–1.20)
Calcium: 8.8 mg/dL (ref 8.4–10.5)
GFR: 44.36 mL/min — ABNORMAL LOW (ref >60.00)
Glucose, Bld: 117 mg/dL — ABNORMAL HIGH (ref 70–99)
Potassium: 4.9 mEq/L (ref 3.5–5.1)
Sodium: 143 mEq/L (ref 135–145)

## 2017-03-31 LAB — LIPID PANEL
CHOL/HDL RATIO: 3
CHOLESTEROL: 127 mg/dL (ref 0–200)
HDL: 46.2 mg/dL (ref >39.00)
LDL CALC: 52 mg/dL (ref 0–99)
NONHDL: 80.45
Triglycerides: 141 mg/dL (ref 0.0–149.0)
VLDL: 28.2 mg/dL (ref 0.0–40.0)

## 2017-03-31 NOTE — Assessment & Plan Note (Signed)
HTN: Seems well controlled on Lopressor, amlodipine, check a BMP. Hyperlipidemia: On Lipitor, not fasting, will check a lipid panel Anxiety, depression, dementia: doing very good per patient's daughter.  Continue Valium, Aricept, Lexapro. Gastroenteritis?  Had GI sxs recently, now back to normal, if she has sxs again recommend conservative treatment , see AVS Generalized itching: See last visit, sxs not related to Aricept, resolved. RTC 5 months

## 2017-04-07 NOTE — Addendum Note (Signed)
Addended byConrad Tullos: Dartanyan Deasis D on: 04/07/2017 04:23 PM   Modules accepted: Orders

## 2017-05-01 ENCOUNTER — Other Ambulatory Visit: Payer: Self-pay | Admitting: Internal Medicine

## 2017-05-05 ENCOUNTER — Other Ambulatory Visit (INDEPENDENT_AMBULATORY_CARE_PROVIDER_SITE_OTHER): Payer: Medicare Other

## 2017-05-05 DIAGNOSIS — N289 Disorder of kidney and ureter, unspecified: Secondary | ICD-10-CM | POA: Diagnosis not present

## 2017-05-05 DIAGNOSIS — I701 Atherosclerosis of renal artery: Secondary | ICD-10-CM

## 2017-05-05 LAB — BASIC METABOLIC PANEL
BUN: 30 mg/dL — ABNORMAL HIGH (ref 6–23)
CALCIUM: 9 mg/dL (ref 8.4–10.5)
CHLORIDE: 105 meq/L (ref 96–112)
CO2: 30 meq/L (ref 19–32)
CREATININE: 1.41 mg/dL — AB (ref 0.40–1.20)
GFR: 38.24 mL/min — ABNORMAL LOW (ref >60.00)
Glucose, Bld: 117 mg/dL — ABNORMAL HIGH (ref 70–99)
Potassium: 4.6 mEq/L (ref 3.5–5.1)
SODIUM: 141 meq/L (ref 135–145)

## 2017-05-09 NOTE — Addendum Note (Signed)
Addended byConrad [place]: Lavonya Hoerner D on: 05/09/2017 08:56 AM   Modules accepted: Orders

## 2017-05-16 ENCOUNTER — Telehealth: Payer: Self-pay | Admitting: Internal Medicine

## 2017-05-16 NOTE — Telephone Encounter (Signed)
We have ordered a renal ultrasound to see why she is having these symptoms.

## 2017-05-16 NOTE — Telephone Encounter (Signed)
Copied from CRM [id number]. Topic: Quick Communication - Lab Results >> May 16, 2017 10:08 AM Vivia EwingWalser, Alishba Naples A wrote:   Pt. Calling to get lab results from 1/10 labs. Please return pt. Call.  Pt stated she is still have same symptoms and they are getting worse.  Symptoms include frequent urination and back pain (since December)

## 2017-05-18 NOTE — Telephone Encounter (Signed)
Please adivse

## 2017-05-18 NOTE — Telephone Encounter (Signed)
Pt needing lab results given to her. Sara [name], daughter states she has called numerous times and pt still hasn't received a call.

## 2017-05-18 NOTE — Telephone Encounter (Signed)
Spoke w/ Inetta Fermoina, she wonders about the ultrasound that we ordered. Plan: Please advised the schedulers to talk directly with the patient's daughter, Inetta Fermoina, her phone is on file and schedule the test.  Also, she reports that Lurena JoinerRebecca is having some urinary symptoms, advised to come to the office for check.

## 2017-05-20 ENCOUNTER — Ambulatory Visit (INDEPENDENT_AMBULATORY_CARE_PROVIDER_SITE_OTHER): Payer: Medicare Other | Admitting: Internal Medicine

## 2017-05-20 ENCOUNTER — Encounter: Payer: Self-pay | Admitting: Internal Medicine

## 2017-05-20 VITALS — BP 116/70 | HR 59 | Temp 97.7°F | Resp 14 | Ht 64.0 in | Wt 138.1 lb

## 2017-05-20 DIAGNOSIS — R5381 Other malaise: Secondary | ICD-10-CM

## 2017-05-20 DIAGNOSIS — N390 Urinary tract infection, site not specified: Secondary | ICD-10-CM

## 2017-05-20 DIAGNOSIS — R5383 Other fatigue: Secondary | ICD-10-CM | POA: Diagnosis not present

## 2017-05-20 DIAGNOSIS — R300 Dysuria: Secondary | ICD-10-CM

## 2017-05-20 LAB — POC URINALSYSI DIPSTICK (AUTOMATED)
Bilirubin, UA: NEGATIVE
Glucose, UA: NEGATIVE
Ketones, UA: NEGATIVE
Nitrite, UA: NEGATIVE
SPEC GRAV UA: 1.02 (ref 1.010–1.025)
UROBILINOGEN UA: 0.2 U/dL
pH, UA: 6 (ref 5.0–8.0)

## 2017-05-20 MED ORDER — CIPROFLOXACIN HCL 250 MG PO TABS: 250.0000 mg | ORAL_TABLET | Freq: Two times a day (BID) | ORAL | 0 refills | Status: DC

## 2017-05-20 NOTE — Progress Notes (Signed)
Pre visit review using our clinic review tool, if applicable. No additional management support is needed unless otherwise documented below in the visit note. 

## 2017-05-20 NOTE — Patient Instructions (Signed)
GO TO THE LAB : Get the blood work    Start taking the antibiotic ciprofloxacin for 5 days  Call anytime if you feel worse, you have fever, chills.  Call if not gradually improving in the next 10 days

## 2017-05-20 NOTE — Progress Notes (Signed)
Subjective:    Patient ID: Sara [name], female    DOB: [birth year], 77 y.o.   MRN: [id number]  DOS:  05/20/2017 Type of visit - description : acute Interval history: Patient made appointment for a UTI but her main complaint is "I just don't feel good".   States that she is not feeling well for 2 weeks, worse over the last few days. "All I do is wake up, have breakfast and then go back right to sleep".  Reports severe fatigue.  Also, 8-10 days history of dysuria, urinary frequency and nocturia.  Not sleeping well because of that.  Urine is normal in color and very small amounts every time she goes.  Wt Readings from Last 3 Encounters:  05/20/17 138 lb 2 oz (62.7 kg)  03/30/17 143 lb 2 oz (64.9 kg)  11/29/16 144 lb 6 oz (65.5 kg)    Review of Systems No fever chills No nausea, vomiting or abdominal pain. No recent flu syndrome or URI Back pain at baseline History of neuropathy and has severe bilateral lower extremity pain. Anxiety and depression controlled No headaches   Past Medical History:  Diagnosis Date  . Anxiety   . Arthritis   . Bronchitis    hx of  . Carotid artery occlusion   . Chronic pain syndrome    pain management  . Coronary artery disease    sees Dr. Patty Sermons  . GERD (gastroesophageal reflux disease)    hx of  . H/O hiatal hernia   . Hypercholesteremia   . Hypertension    all managed by Dr. Haynes Dage bill  . Mitral valve regurgitation    trace  . Non-traumatic compression fracture of thoracic vertebra (HCC)    T9  . Stroke [facility])     Past Surgical History:  Procedure Laterality Date  . BACK SURGERY     x 3 Dr Juliene Pina, last @ [facility] ~ 2005, rods, fusion, four surgery  . CARDIAC CATHETERIZATION  1994  . CAROTID ENDARTERECTOMY    . CARPAL TUNNEL RELEASE     left  . CHOLECYSTECTOMY    . ENDARTERECTOMY  04/24/2012   Procedure: ENDARTERECTOMY CAROTID;  Surgeon: Larina Earthly, MD;  Location[facility] OR;  Service: Vascular;  Laterality: Left;  . HAND  SURGERY     bilateral  . HIP FRACTURE SURGERY Left   . HIP PINNING,CANNULATED Left 02/08/2015   Procedure: CANNULATED HIP PINNING;  Surgeon: Kathryne Hitch, MD;  Location: [facility] ORS;  Service: Orthopedics;  Laterality: Left;  . HIP PINNING,CANNULATED Right 03/31/2016   Procedure: CANNULATED HIP PINNING;  Surgeon: Kathryne Hitch, MD;  Location: [facility] ORS;  Service: Orthopedics;  Laterality: Right;  . HYSTEROTOMY    . SPINAL CORD STIMULATOR INSERTION    . TEE WITHOUT CARDIOVERSION  04/12/2012   Procedure: TRANSESOPHAGEAL ECHOCARDIOGRAM (TEE);  Surgeon: Lewayne Bunting, MD;  Location: [facility] ENDOSCOPY;  Service: Cardiovascular;  Laterality: N/A;    Social History   Socioeconomic History  . Marital status: Married    Spouse name: Not on file  . Number of children: 4  . Years of education: Not on file  . Highest education level: Not on file  Social Needs  . Financial resource strain: Not on file  . Food insecurity - worry: Not on file  . Food insecurity - inability: Not on file  . Transportation needs - medical: Not on file  . Transportation needs - non-medical: Not on file  Occupational History  . Occupation: retired  Tobacco Use  . Smoking status: Former Smoker    Packs/day: 0.50    Years: 60.00    Pack years: 30.00    Types: Cigarettes  . Smokeless tobacco: Current User  . Tobacco comment: quit 04-2015  Substance and Sexual Activity  . Alcohol use: No    Alcohol/week: 0.0 oz  . Drug use: No  . Sexual activity: No  Other Topics Concern  . Not on file  Social History Narrative   Lives w/ husband in a one story home.     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   Education: high school      Allergies as of 05/20/2017      Reactions   Morphine And Related Rash   Percocet [oxycodone-acetaminophen] Rash   Tolerates APAP (including IV), morphine, and Hydrocodone/APAP   Hydrochlorothiazide    Pt does not remember   Indomethacin     Pt does not remember   Paroxetine Hcl    Pt does not remember   Penicillins    Has patient had a PCN reaction causing immediate rash, facial/tongue/throat swelling, SOB or lightheadedness with hypotension: Yes Has patient had a PCN reaction causing severe rash involving mucus membranes or skin necrosis: No Has patient had a PCN reaction that required hospitalization No Has patient had a PCN reaction occurring within the last 10 years: No If all of the above answers are "NO", then may proceed with Cephalosporin use.   Pravachol    Pt does not remember   Quinine Derivatives    Pt does not remember   Wellbutrin [bupropion]    agitation   Zocor [simvastatin - High Dose] Other (See Comments)   Weakness/no energy      Medication List        Accurate as of 05/20/17  2:41 PM. Always use your most recent med list.          alendronate 70 MG tablet Commonly known as:  FOSAMAX Take 1 tablet (70 mg total) by mouth every 7 (seven) days. Take with a full glass of water on an empty stomach.   amLODipine 10 MG tablet Commonly known as:  NORVASC Take 1 tablet (10 mg total) by mouth daily.   aspirin 325 MG EC tablet Take 1 tablet (325 mg total) by mouth daily with breakfast. Please take for total of 30 days, then change back to aspirin 81 mg oral daily after 30 days   atorvastatin 40 MG tablet Commonly known as:  LIPITOR Take 0.5 tablets (20 mg total) by mouth daily.   diazepam 5 MG tablet Commonly known as:  VALIUM Take 1 tablet (5 mg total) by mouth daily as needed.   donepezil 5 MG tablet Commonly known as:  ARICEPT Take 1 tablet (5 mg total) by mouth at bedtime.   escitalopram 5 MG tablet Commonly known as:  LEXAPRO Take 1 tablet (5 mg total) by mouth daily.   gabapentin 600 MG tablet Commonly known as:  NEURONTIN Take 1 tablet (600 mg total) by mouth 4 (four) times daily.   HYDROcodone-acetaminophen 10-325 MG tablet Commonly known as:  NORCO Take 1 tablet by mouth every  4 (four) hours as needed for moderate pain (for pain).   HYSINGLA ER 40 MG T24a Generic drug:  HYDROcodone Bitartrate ER TK 1 T PO QD   metoprolol tartrate 50 MG tablet Commonly known as:  LOPRESSOR Take 0.5 tablets (25 mg total) by  mouth 2 (two) times daily.   omeprazole 20 MG capsule Commonly known as:  PRILOSEC Take 1 capsule (20 mg total) by mouth daily.   vitamin B-12 1000 MCG tablet Commonly known as:  CYANOCOBALAMIN Take 1,000 mcg by mouth daily.          Objective:   Physical Exam BP 116/70 (BP Location: Left Arm, Patient Position: Sitting, Cuff Size: Small)   Pulse (!) 59   Temp 97.7 F (36.5 C) (Oral)   Resp 14   Ht 5\' 4"  (1.626 m)   Wt 138 lb 2 oz (62.7 kg)   SpO2 97%   BMI 23.71 kg/m  General:   Well developed, elderly lady, looks tired but in no acute distress, nontoxic HEENT:  Normocephalic . Face symmetric, atraumatic.  Good temporary artery pulse and no TTP at the temples. Lungs:  CTA B Normal respiratory effort, no intercostal retractions, no accessory muscle use. Heart: RRR,  no murmur.  no pretibial edema bilaterally  Abdomen:  Not distended, soft, non-tender. No rebound or rigidity.   Skin: Not pale. Not jaundice Neurologic:  alert & oriented X3.  Speech normal, gait appropriate for age, slow and assisted by a cane. Psych--  Cognition and judgment appear intact.  Cooperative with normal attention span and concentration.  Behavior appropriate. No anxious or depressed appearing.     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 Depression started Lexapro around 04-2011 CV:Dr. Brackbill --CAD  --H/o Stroke 2013:non hemorrhagic infarcts scattered throughout the [place] Hem[place] . 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): no need for intervention per  Neurosurgery consult  --Carotid artery  disease, endarterectomy, left, 2013 --Mitral valve prolapse --Infrarenal abdominal aortic aneurysm 3.5 cm, per CT 08/2015, 2 years [facility]: -Osteoporosis  last DEXA -hip fx 01-2015 -Vertebral FX (s/p v-plasty) - R Hip Fx 03-2016 -Chronic back pain , s/p spinal cord stimulator  Dr Jordan Likes   Abnormal CT abd/pelvis  2017: See full report, some of the findings>> Atherosclerotic ulcer left subclavian T 8 compression fracture Infrarenal abdominal aortic aneurysm 3.5 cm, follow-up 2 years Narrow  left renal artery , SMA occluded with pancreatic collaterals Mild mesenteric adenopathy, possibly stable, consider 2 year follow-up  R distal ureter enhancement per CT: Saw urology 07-2015, Rx a ultrasound   R adrenal adenoma Smoker , quit ~ 04-2015  Urosepsis 05-2015   PLAN: UTI: udip certainly positive for a UTI, send the UCX and UA, start empiric ciprofloxacin, 250 mg, adjusted for elevated creatinine.   Malaise, fatigue: Going on for 2 weeks, UTI symptoms started a week ago, still could be related.  Doubt PMR or TA with no fever, chills, headaches.  Will check her renal function with a CMP; also check   CBC, TSH B12 and folic acid. Increased creatinine: That has been a problem in the last few weeks, checking labs today, renal artery ultrasound pending.

## 2017-05-21 LAB — CBC WITH DIFFERENTIAL/PLATELET
Basophils Absolute: 50 cells/uL (ref 0–200)
Basophils Relative: 0.5 %
EOS ABS: 380 {cells}/uL (ref 15–500)
EOS PCT: 3.8 %
HEMATOCRIT: 42.1 % (ref 35.0–45.0)
Hemoglobin: 13.9 g/dL (ref 11.7–15.5)
Lymphs Abs: 2690 cells/uL (ref 850–3900)
MCH: 29 pg (ref 27.0–33.0)
MCHC: 33 g/dL (ref 32.0–36.0)
MCV: 87.7 fL (ref 80.0–100.0)
MONOS PCT: 6.7 %
MPV: 10.9 fL (ref 7.5–12.5)
NEUTROS ABS: 6210 {cells}/uL (ref 1500–7800)
Neutrophils Relative %: 62.1 %
PLATELETS: 317 10*3/uL (ref 140–400)
RBC: 4.8 10*6/uL (ref 3.80–5.10)
RDW: 13.2 % (ref 11.0–15.0)
Total Lymphocyte: 26.9 %
WBC: 10 10*3/uL (ref 3.8–10.8)
WBCMIX: 670 {cells}/uL (ref 200–950)

## 2017-05-21 LAB — URINALYSIS, ROUTINE W REFLEX MICROSCOPIC
BILIRUBIN URINE: NEGATIVE
GLUCOSE, UA: NEGATIVE
Hyaline Cast: NONE SEEN /LPF
Ketones, ur: NEGATIVE
NITRITE: NEGATIVE
PH: 6.5 (ref 5.0–8.0)
SPECIFIC GRAVITY, URINE: 1.018 (ref 1.001–1.03)

## 2017-05-21 LAB — COMPREHENSIVE METABOLIC PANEL
AG Ratio: 1.4 (calc) (ref 1.0–2.5)
ALT: 14 U/L (ref 6–29)
AST: 16 U/L (ref 10–35)
Albumin: 4.1 g/dL (ref 3.6–5.1)
Alkaline phosphatase (APISO): 93 U/L (ref 33–130)
BUN / CREAT RATIO: 20 (calc) (ref 6–22)
BUN: 25 mg/dL (ref 7–25)
CO2: 28 mmol/L (ref 20–32)
CREATININE: 1.26 mg/dL — AB (ref 0.60–0.93)
Calcium: 9.4 mg/dL (ref 8.6–10.4)
Chloride: 101 mmol/L (ref 98–110)
GLUCOSE: 99 mg/dL (ref 65–99)
Globulin: 3 g/dL (calc) (ref 1.9–3.7)
Potassium: 4.4 mmol/L (ref 3.5–5.3)
Sodium: 138 mmol/L (ref 135–146)
Total Bilirubin: 0.5 mg/dL (ref 0.2–1.2)
Total Protein: 7.1 g/dL (ref 6.1–8.1)

## 2017-05-21 LAB — TSH: TSH: 0.98 mIU/L (ref 0.40–4.50)

## 2017-05-21 LAB — FOLATE: Folate: 10.7 ng/mL

## 2017-05-21 LAB — VITAMIN B12: VITAMIN B 12: 255 pg/mL (ref 200–1100)

## 2017-05-21 NOTE — Assessment & Plan Note (Signed)
UTI: udip certainly positive for a UTI, send the UCX and UA, start empiric ciprofloxacin, 250 mg, adjusted for elevated creatinine.   Malaise, fatigue: Going on for 2 weeks, UTI symptoms started a week ago, still could be related.  Doubt PMR or TA with no fever, chills, headaches.  Will check her renal function with a CMP; also check   CBC, TSH B12 and folic acid. Increased creatinine: That has been a problem in the last few weeks, checking labs today, renal artery ultrasound pending.

## 2017-05-23 ENCOUNTER — Other Ambulatory Visit: Payer: Self-pay

## 2017-05-23 DIAGNOSIS — I701 Atherosclerosis of renal artery: Secondary | ICD-10-CM

## 2017-05-23 LAB — URINE CULTURE
MICRO NUMBER:: 90109020
SPECIMEN QUALITY: ADEQUATE

## 2017-05-24 ENCOUNTER — Other Ambulatory Visit: Payer: Self-pay | Admitting: Internal Medicine

## 2017-05-24 MED ORDER — NITROFURANTOIN MONOHYD MACRO 100 MG PO CAPS
100.0000 mg | ORAL_CAPSULE | Freq: Two times a day (BID) | ORAL | 0 refills | Status: DC
Start: 2017-05-24 — End: 2017-07-13

## 2017-05-27 DIAGNOSIS — J189 Pneumonia, unspecified organism: Secondary | ICD-10-CM

## 2017-05-27 HISTORY — DX: Pneumonia, unspecified organism: J18.9

## 2017-06-03 ENCOUNTER — Ambulatory Visit (HOSPITAL_COMMUNITY)
Admission: RE | Admit: 2017-06-03 | Discharge: 2017-06-03 | Disposition: A | Discharge status: Home / Self Care | Payer: Medicare Other | Source: Ambulatory Visit | Attending: Internal Medicine | Admitting: Internal Medicine

## 2017-06-03 DIAGNOSIS — I701 Atherosclerosis of renal artery: Secondary | ICD-10-CM | POA: Diagnosis present

## 2017-06-03 DIAGNOSIS — I774 Celiac artery compression syndrome: Secondary | ICD-10-CM | POA: Insufficient documentation

## 2017-06-03 DIAGNOSIS — R9389 Abnormal findings on diagnostic imaging of other specified body structures: Secondary | ICD-10-CM | POA: Diagnosis not present

## 2017-06-03 NOTE — Progress Notes (Signed)
*  PRELIMINARY RESULTS* Vascular Ultrasound Renal Artery Duplex has been completed.  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 Multiple anechoic areas visualized within and outside the kidney, suggestive of renal cysts.  Left: Visualized segments of the left renal artery exhibit 1-59% stenosis. Unable to visualize the origin or proximal left renal artery segments. Non-visualization, along with the patient's previous CT angiogram (08/2015) which revealed a significant left renal artery origin narrowing, is suggestive of possible occlusion. Multiple anechoic areas within and outside the kidney were visualized, largest measuring 4.7cm, suggestive of renal cysts.  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 Unable to adequately evaluate SMA.  Aorta measures 3.6cm at its largest diameter in the proximal segment, suggestive of aneurysmal changes.   06/03/2017 1:22 PM Gertie FeyMichelle Dorsie Sethi, BS, RVT, RDCS, RDMS

## 2017-06-08 ENCOUNTER — Other Ambulatory Visit: Payer: Self-pay

## 2017-06-08 DIAGNOSIS — I701 Atherosclerosis of renal artery: Secondary | ICD-10-CM

## 2017-06-27 ENCOUNTER — Other Ambulatory Visit: Payer: Self-pay | Admitting: Internal Medicine

## 2017-07-12 ENCOUNTER — Inpatient Hospital Stay (HOSPITAL_COMMUNITY)
Admission: EM | Admit: 2017-07-12 | Discharge: 2017-07-16 | DRG: 195 | Disposition: A | Discharge status: Home Health | Payer: Medicare Other | Attending: Internal Medicine | Admitting: Internal Medicine

## 2017-07-12 ENCOUNTER — Emergency Department (HOSPITAL_COMMUNITY): Payer: Medicare Other

## 2017-07-12 DIAGNOSIS — Z7983 Long term (current) use of bisphosphonates: Secondary | ICD-10-CM

## 2017-07-12 DIAGNOSIS — I340 Nonrheumatic mitral (valve) insufficiency: Secondary | ICD-10-CM | POA: Diagnosis present

## 2017-07-12 DIAGNOSIS — I4581 Long QT syndrome: Secondary | ICD-10-CM | POA: Diagnosis not present

## 2017-07-12 DIAGNOSIS — R740 Nonspecific elevation of levels of transaminase and lactic acid dehydrogenase [LDH]: Secondary | ICD-10-CM | POA: Diagnosis present

## 2017-07-12 DIAGNOSIS — R7989 Other specified abnormal findings of blood chemistry: Secondary | ICD-10-CM | POA: Diagnosis present

## 2017-07-12 DIAGNOSIS — J181 Lobar pneumonia, unspecified organism: Principal | ICD-10-CM | POA: Diagnosis present

## 2017-07-12 DIAGNOSIS — Z9049 Acquired absence of other specified parts of digestive tract: Secondary | ICD-10-CM

## 2017-07-12 DIAGNOSIS — M810 Age-related osteoporosis without current pathological fracture: Secondary | ICD-10-CM | POA: Diagnosis present

## 2017-07-12 DIAGNOSIS — I10 Essential (primary) hypertension: Secondary | ICD-10-CM | POA: Diagnosis not present

## 2017-07-12 DIAGNOSIS — G8929 Other chronic pain: Secondary | ICD-10-CM | POA: Diagnosis present

## 2017-07-12 DIAGNOSIS — Z87891 Personal history of nicotine dependence: Secondary | ICD-10-CM

## 2017-07-12 DIAGNOSIS — Z9689 Presence of other specified functional implants: Secondary | ICD-10-CM | POA: Diagnosis present

## 2017-07-12 DIAGNOSIS — Z79891 Long term (current) use of opiate analgesic: Secondary | ICD-10-CM

## 2017-07-12 DIAGNOSIS — K224 Dyskinesia of esophagus: Secondary | ICD-10-CM | POA: Diagnosis not present

## 2017-07-12 DIAGNOSIS — R1319 Other dysphagia: Secondary | ICD-10-CM | POA: Diagnosis present

## 2017-07-12 DIAGNOSIS — G894 Chronic pain syndrome: Secondary | ICD-10-CM | POA: Diagnosis present

## 2017-07-12 DIAGNOSIS — I2510 Atherosclerotic heart disease of native coronary artery without angina pectoris: Secondary | ICD-10-CM | POA: Diagnosis present

## 2017-07-12 DIAGNOSIS — K219 Gastro-esophageal reflux disease without esophagitis: Secondary | ICD-10-CM | POA: Diagnosis not present

## 2017-07-12 DIAGNOSIS — Z87440 Personal history of urinary (tract) infections: Secondary | ICD-10-CM

## 2017-07-12 DIAGNOSIS — Z7982 Long term (current) use of aspirin: Secondary | ICD-10-CM

## 2017-07-12 DIAGNOSIS — Z881 Allergy status to other antibiotic agents status: Secondary | ICD-10-CM

## 2017-07-12 DIAGNOSIS — M549 Dorsalgia, unspecified: Secondary | ICD-10-CM | POA: Diagnosis present

## 2017-07-12 DIAGNOSIS — J189 Pneumonia, unspecified organism: Secondary | ICD-10-CM

## 2017-07-12 DIAGNOSIS — Z8673 Personal history of transient ischemic attack (TIA), and cerebral infarction without residual deficits: Secondary | ICD-10-CM | POA: Diagnosis not present

## 2017-07-12 DIAGNOSIS — Z8249 Family history of ischemic heart disease and other diseases of the circulatory system: Secondary | ICD-10-CM

## 2017-07-12 DIAGNOSIS — E785 Hyperlipidemia, unspecified: Secondary | ICD-10-CM | POA: Diagnosis not present

## 2017-07-12 DIAGNOSIS — Z885 Allergy status to narcotic agent status: Secondary | ICD-10-CM

## 2017-07-12 DIAGNOSIS — G629 Polyneuropathy, unspecified: Secondary | ICD-10-CM | POA: Diagnosis not present

## 2017-07-12 DIAGNOSIS — I6529 Occlusion and stenosis of unspecified carotid artery: Secondary | ICD-10-CM | POA: Diagnosis present

## 2017-07-12 DIAGNOSIS — F419 Anxiety disorder, unspecified: Secondary | ICD-10-CM | POA: Diagnosis present

## 2017-07-12 DIAGNOSIS — Z79899 Other long term (current) drug therapy: Secondary | ICD-10-CM

## 2017-07-12 DIAGNOSIS — Z888 Allergy status to other drugs, medicaments and biological substances status: Secondary | ICD-10-CM

## 2017-07-12 DIAGNOSIS — R1310 Dysphagia, unspecified: Secondary | ICD-10-CM

## 2017-07-12 DIAGNOSIS — R531 Weakness: Secondary | ICD-10-CM | POA: Diagnosis present

## 2017-07-12 DIAGNOSIS — Z880 Allergy status to penicillin: Secondary | ICD-10-CM

## 2017-07-12 DIAGNOSIS — Z87310 Personal history of (healed) osteoporosis fracture: Secondary | ICD-10-CM

## 2017-07-12 DIAGNOSIS — R0600 Dyspnea, unspecified: Secondary | ICD-10-CM

## 2017-07-12 LAB — I-STAT CG4 LACTIC ACID, ED: Lactic Acid, Venous: 2.13 mmol/L (ref 0.5–1.9)

## 2017-07-12 LAB — CBC WITH DIFFERENTIAL/PLATELET
Basophils Absolute: 0 10*3/uL (ref 0.0–0.1)
Basophils Relative: 0 %
Eosinophils Absolute: 0.1 10*3/uL (ref 0.0–0.7)
Eosinophils Relative: 1 %
HCT: 41.2 % (ref 36.0–46.0)
HEMOGLOBIN: 13.7 g/dL (ref 12.0–15.0)
LYMPHS ABS: 3.2 10*3/uL (ref 0.7–4.0)
Lymphocytes Relative: 18 %
MCH: 30.8 pg (ref 26.0–34.0)
MCHC: 33.3 g/dL (ref 30.0–36.0)
MCV: 92.6 fL (ref 78.0–100.0)
MONOS PCT: 6 %
Monocytes Absolute: 1 10*3/uL (ref 0.1–1.0)
NEUTROS PCT: 75 %
Neutro Abs: 13.1 10*3/uL — ABNORMAL HIGH (ref 1.7–7.7)
Platelets: 256 10*3/uL (ref 150–400)
RBC: 4.45 MIL/uL (ref 3.87–5.11)
RDW: 14.1 % (ref 11.5–15.5)
WBC: 17.4 10*3/uL — ABNORMAL HIGH (ref 4.0–10.5)

## 2017-07-12 MED ORDER — SODIUM CHLORIDE 0.9 % IV BOLUS (SEPSIS)
1000.0000 mL | Freq: Once | INTRAVENOUS | Status: AC
  Administered 2017-07-12: 1000 mL via INTRAVENOUS

## 2017-07-12 MED ORDER — SODIUM CHLORIDE 0.9 % IV SOLN
1.0000 g | Freq: Once | INTRAVENOUS | Status: AC
  Administered 2017-07-13: 1 g via INTRAVENOUS
  Filled 2017-07-12: qty 10

## 2017-07-12 NOTE — ED Notes (Signed)
EKG given to EDP,Nanavati,MD., for review. 

## 2017-07-12 NOTE — ED Notes (Signed)
Bed: WA12 Expected date:  Expected time:  Means of arrival:  Comments: EMS 

## 2017-07-12 NOTE — ED Triage Notes (Addendum)
Pt BIB [facility]. Pt family reports that pt has not had an appetite or thirst starting today. She has increased generalized weakness/fatigue. Hx of UTI and reports darker and more malodorous then normal. CAOx4. EMS gave 1,000 mg Tylenol en route to the ED for a fever.

## 2017-07-13 ENCOUNTER — Emergency Department (HOSPITAL_COMMUNITY): Payer: Medicare Other

## 2017-07-13 ENCOUNTER — Other Ambulatory Visit: Payer: Self-pay

## 2017-07-13 ENCOUNTER — Encounter (HOSPITAL_COMMUNITY): Payer: Self-pay

## 2017-07-13 DIAGNOSIS — E785 Hyperlipidemia, unspecified: Secondary | ICD-10-CM | POA: Diagnosis not present

## 2017-07-13 DIAGNOSIS — R7989 Other specified abnormal findings of blood chemistry: Secondary | ICD-10-CM | POA: Diagnosis not present

## 2017-07-13 DIAGNOSIS — J189 Pneumonia, unspecified organism: Secondary | ICD-10-CM | POA: Diagnosis present

## 2017-07-13 DIAGNOSIS — Z8673 Personal history of transient ischemic attack (TIA), and cerebral infarction without residual deficits: Secondary | ICD-10-CM | POA: Diagnosis not present

## 2017-07-13 DIAGNOSIS — K224 Dyskinesia of esophagus: Secondary | ICD-10-CM | POA: Diagnosis not present

## 2017-07-13 DIAGNOSIS — R531 Weakness: Secondary | ICD-10-CM

## 2017-07-13 DIAGNOSIS — I340 Nonrheumatic mitral (valve) insufficiency: Secondary | ICD-10-CM | POA: Diagnosis not present

## 2017-07-13 DIAGNOSIS — G894 Chronic pain syndrome: Secondary | ICD-10-CM | POA: Diagnosis not present

## 2017-07-13 DIAGNOSIS — M549 Dorsalgia, unspecified: Secondary | ICD-10-CM | POA: Diagnosis not present

## 2017-07-13 DIAGNOSIS — F419 Anxiety disorder, unspecified: Secondary | ICD-10-CM | POA: Diagnosis not present

## 2017-07-13 DIAGNOSIS — J181 Lobar pneumonia, unspecified organism: Secondary | ICD-10-CM

## 2017-07-13 DIAGNOSIS — I10 Essential (primary) hypertension: Secondary | ICD-10-CM

## 2017-07-13 DIAGNOSIS — G629 Polyneuropathy, unspecified: Secondary | ICD-10-CM | POA: Diagnosis not present

## 2017-07-13 DIAGNOSIS — M810 Age-related osteoporosis without current pathological fracture: Secondary | ICD-10-CM | POA: Diagnosis not present

## 2017-07-13 DIAGNOSIS — R1310 Dysphagia, unspecified: Secondary | ICD-10-CM | POA: Diagnosis not present

## 2017-07-13 DIAGNOSIS — I6529 Occlusion and stenosis of unspecified carotid artery: Secondary | ICD-10-CM | POA: Diagnosis not present

## 2017-07-13 DIAGNOSIS — R1319 Other dysphagia: Secondary | ICD-10-CM | POA: Diagnosis not present

## 2017-07-13 DIAGNOSIS — I4581 Long QT syndrome: Secondary | ICD-10-CM | POA: Diagnosis not present

## 2017-07-13 DIAGNOSIS — G8929 Other chronic pain: Secondary | ICD-10-CM | POA: Diagnosis not present

## 2017-07-13 DIAGNOSIS — I2510 Atherosclerotic heart disease of native coronary artery without angina pectoris: Secondary | ICD-10-CM | POA: Diagnosis not present

## 2017-07-13 DIAGNOSIS — R740 Nonspecific elevation of levels of transaminase and lactic acid dehydrogenase [LDH]: Secondary | ICD-10-CM | POA: Diagnosis not present

## 2017-07-13 DIAGNOSIS — K219 Gastro-esophageal reflux disease without esophagitis: Secondary | ICD-10-CM | POA: Diagnosis not present

## 2017-07-13 LAB — COMPREHENSIVE METABOLIC PANEL
ALK PHOS: 69 U/L (ref 38–126)
ALT: 19 U/L (ref 14–54)
AST: 23 U/L (ref 15–41)
Albumin: 3.1 g/dL — ABNORMAL LOW (ref 3.5–5.0)
Anion gap: 8 (ref 5–15)
BUN: 20 mg/dL (ref 6–20)
CALCIUM: 8.1 mg/dL — AB (ref 8.9–10.3)
CHLORIDE: 107 mmol/L (ref 101–111)
CO2: 24 mmol/L (ref 22–32)
CREATININE: 0.93 mg/dL (ref 0.44–1.00)
GFR calc Af Amer: >60 mL/min (ref >60)
GFR, EST NON AFRICAN AMERICAN: 57 mL/min — AB (ref >60)
Glucose, Bld: 102 mg/dL — ABNORMAL HIGH (ref 65–99)
Potassium: 3.6 mmol/L (ref 3.5–5.1)
Sodium: 139 mmol/L (ref 135–145)
Total Bilirubin: 1 mg/dL (ref 0.3–1.2)
Total Protein: 6.1 g/dL — ABNORMAL LOW (ref 6.5–8.1)

## 2017-07-13 LAB — URINALYSIS, ROUTINE W REFLEX MICROSCOPIC
BILIRUBIN URINE: NEGATIVE
GLUCOSE, UA: NEGATIVE mg/dL
HGB URINE DIPSTICK: NEGATIVE
KETONES UR: NEGATIVE mg/dL
LEUKOCYTES UA: NEGATIVE
Nitrite: NEGATIVE
PH: 7 (ref 5.0–8.0)
PROTEIN: NEGATIVE mg/dL
Specific Gravity, Urine: 1.014 (ref 1.005–1.030)

## 2017-07-13 LAB — I-STAT CHEM 8, ED
BUN: 21 mg/dL — AB (ref 6–20)
CALCIUM ION: 0.97 mmol/L — AB (ref 1.15–1.40)
CHLORIDE: 109 mmol/L (ref 101–111)
Creatinine, Ser: 1 mg/dL (ref 0.44–1.00)
Glucose, Bld: 97 mg/dL (ref 65–99)
HEMATOCRIT: 34 % — AB (ref 36.0–46.0)
Hemoglobin: 11.6 g/dL — ABNORMAL LOW (ref 12.0–15.0)
Potassium: 3.8 mmol/L (ref 3.5–5.1)
SODIUM: 140 mmol/L (ref 135–145)
TCO2: 22 mmol/L (ref 22–32)

## 2017-07-13 LAB — INFLUENZA PANEL BY PCR (TYPE A & B)
INFLBPCR: NEGATIVE
Influenza A By PCR: NEGATIVE

## 2017-07-13 LAB — I-STAT CG4 LACTIC ACID, ED: Lactic Acid, Venous: 1.1 mmol/L (ref 0.5–1.9)

## 2017-07-13 LAB — STREP PNEUMONIAE URINARY ANTIGEN: STREP PNEUMO URINARY ANTIGEN: NEGATIVE

## 2017-07-13 LAB — HIV ANTIBODY (ROUTINE TESTING W REFLEX): HIV Screen 4th Generation wRfx: NONREACTIVE

## 2017-07-13 MED ORDER — IOPAMIDOL (ISOVUE-300) INJECTION 61%
75.0000 mL | Freq: Once | INTRAVENOUS | Status: AC | PRN
  Administered 2017-07-13: 75 mL via INTRAVENOUS

## 2017-07-13 MED ORDER — HYDROCODONE-ACETAMINOPHEN 10-325 MG PO TABS
1.0000 | ORAL_TABLET | ORAL | Status: DC | PRN
  Administered 2017-07-13 – 2017-07-16 (×11): 1 via ORAL
  Filled 2017-07-13 (×11): qty 1

## 2017-07-13 MED ORDER — DIAZEPAM 5 MG PO TABS: 5.0000 mg | ORAL_TABLET | Freq: Every day | ORAL | Status: DC | PRN

## 2017-07-13 MED ORDER — IBUPROFEN 800 MG PO TABS
800.0000 mg | ORAL_TABLET | Freq: Once | ORAL | Status: AC
  Administered 2017-07-13: 800 mg via ORAL
  Filled 2017-07-13: qty 1

## 2017-07-13 MED ORDER — PANTOPRAZOLE SODIUM 40 MG PO TBEC
40.0000 mg | DELAYED_RELEASE_TABLET | Freq: Every day | ORAL | Status: DC
  Administered 2017-07-13 – 2017-07-16 (×4): 40 mg via ORAL
  Filled 2017-07-13 (×4): qty 1

## 2017-07-13 MED ORDER — VITAMIN B-12 1000 MCG PO TABS
1000.0000 ug | ORAL_TABLET | Freq: Every day | ORAL | Status: DC
  Administered 2017-07-13 – 2017-07-16 (×4): 1000 ug via ORAL
  Filled 2017-07-13 (×4): qty 1

## 2017-07-13 MED ORDER — SERTRALINE HCL 50 MG PO TABS
25.0000 mg | ORAL_TABLET | Freq: Every day | ORAL | Status: DC
  Administered 2017-07-13 – 2017-07-16 (×4): 25 mg via ORAL
  Filled 2017-07-13 (×4): qty 1

## 2017-07-13 MED ORDER — SODIUM CHLORIDE 0.9 % IV SOLN
1.0000 g | Freq: Every day | INTRAVENOUS | Status: DC
  Administered 2017-07-13 – 2017-07-14 (×2): 1 g via INTRAVENOUS
  Filled 2017-07-13 (×2): qty 1

## 2017-07-13 MED ORDER — DONEPEZIL HCL 5 MG PO TABS
5.0000 mg | ORAL_TABLET | Freq: Every day | ORAL | Status: DC
  Administered 2017-07-13 – 2017-07-15 (×3): 5 mg via ORAL
  Filled 2017-07-13 (×3): qty 1

## 2017-07-13 MED ORDER — GABAPENTIN 300 MG PO CAPS
600.0000 mg | ORAL_CAPSULE | ORAL | Status: DC
  Administered 2017-07-13 – 2017-07-16 (×13): 600 mg via ORAL
  Filled 2017-07-13 (×13): qty 2

## 2017-07-13 MED ORDER — SODIUM CHLORIDE 0.9 % IV SOLN
500.0000 mg | Freq: Once | INTRAVENOUS | Status: AC
  Administered 2017-07-13: 500 mg via INTRAVENOUS
  Filled 2017-07-13: qty 500

## 2017-07-13 MED ORDER — ENOXAPARIN SODIUM 40 MG/0.4ML ~~LOC~~ SOLN
40.0000 mg | SUBCUTANEOUS | Status: DC
  Administered 2017-07-13 – 2017-07-15 (×3): 40 mg via SUBCUTANEOUS
  Filled 2017-07-13 (×3): qty 0.4

## 2017-07-13 MED ORDER — HYDROMORPHONE HCL 1 MG/ML IJ SOLN
0.5000 mg | INTRAMUSCULAR | Status: AC | PRN
  Administered 2017-07-14 (×2): 0.5 mg via INTRAVENOUS
  Filled 2017-07-13 (×3): qty 1

## 2017-07-13 MED ORDER — IOPAMIDOL (ISOVUE-300) INJECTION 61%
INTRAVENOUS | Status: AC
  Filled 2017-07-13: qty 75

## 2017-07-13 MED ORDER — GABAPENTIN 300 MG PO CAPS
900.0000 mg | ORAL_CAPSULE | Freq: Every day | ORAL | Status: DC
  Administered 2017-07-13 – 2017-07-15 (×3): 900 mg via ORAL
  Filled 2017-07-13 (×3): qty 3

## 2017-07-13 MED ORDER — METOPROLOL TARTRATE 25 MG PO TABS
25.0000 mg | ORAL_TABLET | Freq: Two times a day (BID) | ORAL | Status: DC
  Administered 2017-07-13 – 2017-07-16 (×7): 25 mg via ORAL
  Filled 2017-07-13 (×7): qty 1

## 2017-07-13 MED ORDER — AMLODIPINE BESYLATE 5 MG PO TABS
10.0000 mg | ORAL_TABLET | Freq: Every day | ORAL | Status: DC
  Administered 2017-07-13 – 2017-07-16 (×4): 10 mg via ORAL
  Filled 2017-07-13 (×4): qty 2

## 2017-07-13 MED ORDER — HYDROCODONE BITARTRATE ER 40 MG PO T24A: 40.0000 mg | EXTENDED_RELEASE_TABLET | Freq: Every day | ORAL | Status: DC

## 2017-07-13 MED ORDER — ATORVASTATIN CALCIUM 20 MG PO TABS
20.0000 mg | ORAL_TABLET | Freq: Every day | ORAL | Status: DC
  Administered 2017-07-14 – 2017-07-15 (×2): 20 mg via ORAL
  Filled 2017-07-13 (×2): qty 1

## 2017-07-13 MED ORDER — SODIUM CHLORIDE 0.9 % IJ SOLN
INTRAMUSCULAR | Status: AC
  Filled 2017-07-13: qty 50

## 2017-07-13 MED ORDER — AZITHROMYCIN 250 MG PO TABS
500.0000 mg | ORAL_TABLET | Freq: Every day | ORAL | Status: DC
  Administered 2017-07-13 – 2017-07-15 (×3): 500 mg via ORAL
  Filled 2017-07-13 (×3): qty 2

## 2017-07-13 MED ORDER — DIPHENHYDRAMINE HCL 25 MG PO CAPS
25.0000 mg | ORAL_CAPSULE | Freq: Three times a day (TID) | ORAL | Status: AC | PRN
  Administered 2017-07-13 – 2017-07-14 (×2): 25 mg via ORAL
  Filled 2017-07-13 (×2): qty 1

## 2017-07-13 MED ORDER — ESCITALOPRAM OXALATE 10 MG PO TABS
10.0000 mg | ORAL_TABLET | Freq: Every day | ORAL | Status: DC
  Administered 2017-07-13 – 2017-07-16 (×4): 10 mg via ORAL
  Filled 2017-07-13 (×4): qty 1

## 2017-07-13 MED ORDER — ASPIRIN EC 81 MG PO TBEC
81.0000 mg | DELAYED_RELEASE_TABLET | Freq: Every day | ORAL | Status: DC
  Administered 2017-07-13 – 2017-07-16 (×4): 81 mg via ORAL
  Filled 2017-07-13 (×4): qty 1

## 2017-07-13 NOTE — ED Provider Notes (Signed)
[facility]-EMERGENCY DEPT Provider Note   CSN: [id number] Arrival date & time: 07/12/17  2057     History   Chief Complaint Chief Complaint  Patient presents with  . Weakness    HPI [name] is a 77 y.o. female.  HPI  77 year old with history of CAD, hypertension, hyperlipidemia, stroke who comes in with chief complaint of weakness.  According to family patient was doing okay last night, however today she has started getting severe weakness to the point where she could not even get up without any assistance.  Patient arrives with a fever.  Patient states that she has been having some URI like symptoms, but otherwise she has no chest pain, cough, abdominal pain, UTI-like symptoms, back pain, rash.  Review of system is positive for chills.  Family also reports that patient has had similar symptoms in the past when she got cystitis.  Past Medical History:  Diagnosis Date  . Anxiety   . Arthritis   . Bronchitis    hx of  . Carotid artery occlusion   . Chronic pain syndrome    pain management  . Coronary artery disease    sees Sara. Patty [name]  . GERD (gastroesophageal reflux disease)    hx of  . H/O hiatal hernia   . Hypercholesteremia   . Hypertension    all managed by Sara. Haynes DageBrack [name]  . Mitral valve regurgitation    trace  . Non-traumatic compression fracture of thoracic vertebra (HCC)    T9  . Stroke [facility](HCC)     Patient Active Problem List   Diagnosis Date Noted  . Anxiety and depression 04/14/2016  . Leukocytosis 03/31/2016  . Renal insufficiency 03/31/2016  . Neuropathy 02/12/2016  . AAA (abdominal aortic aneurysm) without rupture (HCC) 06/22/2015  . PCP NOTES >>>>>>>>>>>>>>>>>>>>>>>>>>>>>>>>>. 06/22/2015  . Malnutrition of moderate degree 06/05/2015  . Gait disorder 06/04/2015  . Elevated transaminase level 06/04/2015  . Pre-syncope 06/04/2015  . Closed left hip fracture (HCC) 02/07/2015  . Annual physical exam >>>>>>>>>>>>  01/22/2013  . Caregiver stress 01/22/2013  . 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 Chronic back pain 08/29/2012  . Occlusion and stenosis of carotid artery without mention of cerebral infarction 05/09/2012  . Carotid stenosis, left 04/13/2012  . 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 CVA (cerebral infarction) 04/11/2012  . Arthritis 08/05/2010  . Tobacco abuse 08/05/2010  . Mitral valve regurgitation   . Hypertension   . Hypercholesteremia   . Coronary artery disease     Past Surgical History:  Procedure Laterality Date  . BACK SURGERY     x 3 Sara [name], last @ [facility] ~ 2005, rods, fusion, four surgery  . CARDIAC CATHETERIZATION  1994  . CAROTID ENDARTERECTOMY    . CARPAL TUNNEL RELEASE     left  . CHOLECYSTECTOMY    . ENDARTERECTOMY  04/24/2012   Procedure: ENDARTERECTOMY CAROTID;  Surgeon: Sara Earthlyodd F Early, MD;  [facility] OR;  Service: Vascular;  Laterality: Left;  . HAND SURGERY     bilateral  . HIP FRACTURE SURGERY Left   . HIP PINNING,CANNULATED Left 02/08/2015   Procedure: CANNULATED HIP PINNING;  Surgeon: Sara Hitchhristopher Y Blackman, MD;  Location: [facility] ORS;  Service: Orthopedics;  Laterality: Left;  . HIP PINNING,CANNULATED Right 03/31/2016   Procedure: CANNULATED HIP PINNING;  Surgeon: Sara Hitchhristopher Y Blackman, MD;  Location: [facility] ORS;  Service: Orthopedics;  Laterality: Right;  . HYSTEROTOMY    . SPINAL CORD STIMULATOR INSERTION    .  TEE WITHOUT CARDIOVERSION  04/12/2012   Procedure: TRANSESOPHAGEAL ECHOCARDIOGRAM (TEE);  Surgeon: Sara Bunting, MD;  Location: [facility] ENDOSCOPY;  Service: Cardiovascular;  Laterality: N/A;    OB History    No data available       Home Medications    Prior to Admission medications   Medication Sig Start Date End Date Taking? Authorizing Provider  amLODipine (NORVASC) 10 MG tablet Take 1 tablet (10 mg total) by mouth daily. 05/24/17  Yes Sara Plump, MD  aspirin EC 81 MG tablet Take 81 mg by mouth daily.   Yes  [provider]  atorvastatin (LIPITOR) 40 MG tablet Take 0.5 tablets (20 mg total) by mouth daily. 05/24/17  Yes [name], Sara Rod, MD  donepezil (ARICEPT) 5 MG tablet Take 1 tablet (5 mg total) by mouth at bedtime. 06/27/17  Yes [name], Sara Rod, MD  escitalopram (LEXAPRO) 10 MG tablet Take 10 mg by mouth daily.  06/27/17  Yes [provider]  gabapentin (NEURONTIN) 300 MG capsule Take 900 mg by mouth at bedtime.   Yes [provider]  gabapentin (NEURONTIN) 600 MG tablet Take 1 tablet (600 mg total) by mouth 4 (four) times daily. 05/24/17  Yes [name], Sara Rod, MD  [facility] 40 MG T24A TK 1 T PO QD 09/16/16  Yes [provider]  metoprolol tartrate (LOPRESSOR) 50 MG tablet Take 0.5 tablets (25 mg total) by mouth 2 (two) times daily. 05/02/17  Yes [name], Sara Rod, MD  omeprazole (PRILOSEC) 20 MG capsule Take 1 capsule (20 mg total) by mouth daily. 01/17/17  Yes [name], Sara Rod, MD  sertraline (ZOLOFT) 25 MG tablet Take 25 mg by mouth daily.   Yes [provider]  alendronate (FOSAMAX) 70 MG tablet Take 1 tablet (70 mg total) by mouth every 7 (seven) days. Take with a full glass of water on an empty stomach. Patient not taking: Reported on 07/12/2017 05/25/16   Sara Plump, MD  aspirin 325 MG EC tablet Take 1 tablet (325 mg total) by mouth daily with breakfast. Please take for total of 30 days, then change back to aspirin 81 mg oral daily after 30 days Patient not taking: Reported on 07/12/2017 04/02/16   Sara [name], Sara Roe, MD  diazepam (VALIUM) 5 MG tablet Take 1 tablet (5 mg total) by mouth daily as needed. Patient taking differently: Take 5 mg by mouth daily as needed for anxiety.  01/03/17   Sara Plump, MD  escitalopram (LEXAPRO) 5 MG tablet Take 1 tablet (5 mg total) by mouth daily. Patient not taking: Reported on 07/12/2017 10/29/16   Sara Plump, MD  HYDROcodone-acetaminophen [facility]) 10-325 MG tablet Take 1 tablet by mouth every 4 (four) hours as needed for moderate pain (for pain).  04/14/16   Kirtland Bouchard, PA-C  nitrofurantoin, macrocrystal-monohydrate, (MACROBID) 100 MG capsule Take 1 capsule (100 mg total) by mouth 2 (two) times daily. Patient not taking: Reported on 07/12/2017 05/24/17   Sara Plump, MD  vitamin B-12 (CYANOCOBALAMIN) 1000 MCG tablet Take 1,000 mcg by mouth daily.    [provider]    Family History Family History  Problem Relation Age of Onset  . Heart attack Father   . Heart disease Father        before age 48  . Heart disease Sister   . Cancer Sister   . Heart disease Sister     Social History Social History   Tobacco Use  . Smoking status:  Former Smoker    Packs/day: 0.50    Years: 60.00    Pack years: 30.00    Types: Cigarettes  . Smokeless tobacco: Current User  . Tobacco comment: quit 04-2015  Substance Use Topics  . Alcohol use: No    Alcohol/week: 0.0 oz  . Drug use: No     Allergies   Morphine and related; Percocet [oxycodone-acetaminophen]; Hydrochlorothiazide; Indomethacin; Paroxetine hcl; Penicillins; Pravachol; Quinine derivatives; Wellbutrin [bupropion]; and Zocor [simvastatin - high dose]   Review of Systems Review of Systems  Constitutional: Positive for activity change and fatigue.  Respiratory: Negative for shortness of breath.   Cardiovascular: Negative for chest pain.  Gastrointestinal: Negative for abdominal pain.  Genitourinary: Negative for dysuria and frequency.  Neurological: Positive for weakness.  All other systems reviewed and are negative.    Physical Exam Updated Vital Signs BP (!) 174/64   Pulse 63   Temp 98.6 F (37 C) (Oral)   Resp 15   SpO2 98%   Physical Exam  Constitutional: She is oriented to person, place, and time. She appears well-developed.  HENT:  Head: Normocephalic and atraumatic.  Eyes: EOM are normal.  Neck: Normal range of motion. Neck supple.  Cardiovascular: Normal rate.  Pulmonary/Chest: Effort normal.  Abdominal: Bowel sounds are normal. There is  tenderness. There is no rebound and no guarding.  Suprapubic tenderness  Neurological: She is alert and oriented to person, place, and time.  Skin: Skin is warm and dry.  Nursing note and vitals reviewed.    ED Treatments / Results  Labs (all labs ordered are listed, but only abnormal results are displayed) Labs Reviewed  CBC WITH DIFFERENTIAL/PLATELET - Abnormal; Notable for the following components:      Result Value   WBC 17.4 (*)    Neutro Abs 13.1 (*)    All other components within normal limits  I-STAT CG4 LACTIC ACID, ED - Abnormal; Notable for the following components:   Lactic Acid, Venous 2.13 (*)    All other components within normal limits  CULTURE, BLOOD (ROUTINE X 2)  CULTURE, BLOOD (ROUTINE X 2)  URINE CULTURE  URINALYSIS, ROUTINE W REFLEX MICROSCOPIC  INFLUENZA PANEL BY PCR (TYPE A & B)  COMPREHENSIVE METABOLIC PANEL  I-STAT CG4 LACTIC ACID, ED    EKG  EKG Interpretation  Date/Time:  Tuesday July 12 2017 22:56:31 EDT Ventricular Rate:  65 PR Interval:    QRS Duration: 83 QT Interval:  467 QTC Calculation: 486 R Axis:   48 Text Interpretation:  Sinus rhythm Borderline prolonged PR interval Consider left atrial enlargement Borderline prolonged QT interval No acute changes Confirmed by Derwood Kaplan ([id number]) on 07/12/2017 11:30:04 PM       Radiology Dg Chest Port 1 View  Result Date: 07/12/2017 CLINICAL DATA:  Congestion and fever.  Weakness and fatigue. EXAM: PORTABLE CHEST 1 VIEW COMPARISON:  03/31/2016 FINDINGS: Mild patient rotation. Mild cardiomegaly with tortuous thoracic aorta, atherosclerosis of the arch. There is volume loss in the right lung with patchy right suprahilar airspace disease. Minimal left lung base atelectasis. No large pleural effusion. No pneumothorax. Vertebral augmentation in the midthoracic spine. IMPRESSION: 1. Right lung volume loss. Patchy opacity in the right upper lobe. Findings can be seen with central obstructing lesion.  Recommend chest CT, preferably with contrast. Alternatively, upper lobe opacities may be secondary to pneumonia. 2. Cardiomegaly with tortuous atherosclerotic thoracic aorta. Electronically Signed   By: Rubye Oaks M.D.   On: 07/12/2017 23:29    Procedures  Procedures (including critical care time)  Medications Ordered in ED Medications  sodium chloride 0.9 % bolus 1,000 mL (0 mLs Intravenous Stopped 07/13/17 0025)    And  sodium chloride 0.9 % bolus 1,000 mL (0 mLs Intravenous Stopped 07/13/17 0025)  cefTRIAXone (ROCEPHIN) 1 g in sodium chloride 0.9 % 100 mL IVPB (1 g Intravenous New Bag/Given 07/13/17 0015)     Initial Impression / Assessment and Plan / ED Course  I have reviewed the triage vital signs and the nursing notes.  Pertinent labs & imaging results that were available during my care of the patient were reviewed by me and considered in my medical decision making (see chart for details).    77 year old female comes in with chief complaint of generalized weakness.  Patient noted to be febrile at arrival.  Review of system is positive for congestion, chills, generalized weakness and she had suprapubic tenderness.  History of UTI with similar symptoms in the past.  UA ordered and looks clean.  Chest x-ray has questionable pneumonia versus mass.  CT chest has been ordered and pending. Initial lactate was elevated along with white count.  Patient has received a dose of ceftriaxone.  Transferring care to Sara. Nicanor Alcon. If patient workup is negative but she continues to feel severely weak than she might need admission to the hospital.  Final Clinical Impressions(s) / ED Diagnoses   Final diagnoses:  None    ED Discharge Orders    None       Derwood Kaplan, MD 07/13/17 0100

## 2017-07-13 NOTE — Progress Notes (Addendum)
[facility] PROGRESS NOTE  Denton BrickRebecca S Natzke ONG:[id number] DOB: 03/23/1941 DOA: 07/12/2017  PCP: Wanda PlumpPaz, Jose E, MD  Brief History/Interval Summary: 77 year old female with a past medical history of chronic back pain, hypertension, coronary artery disease presented with complains of shortness of breath and generalized weakness.  Evaluation raised concern for pneumonia.  Patient was hospitalized for further management.  Reason for Visit: Community-acquired pneumonia  Consultants: None  Procedures: None  Antibiotics: Ceftriaxone and azithromycin  Subjective/Interval History: Patient states that she is feeling slightly better.  Her family is at the bedside.  She feels a little stronger.  Denies any cough.  Did have shortness of breath which has improved.  She did have fever at home.  ROS: Denies any nausea or vomiting  Objective:  Vital Signs  Vitals:   07/13/17 0300 07/13/17 0531 07/13/17 0643 07/13/17 1014  BP: (!) 159/64 119/81  (!) 140/46  Pulse: 65 64  60  Resp: 19 18    Temp:  98.8 F (37.1 C)    TempSrc:  Oral    SpO2: 92% 97%    Weight:   66.2 kg (145 lb 15.1 oz)   Height:   5\' 5"  (1.651 m)     Intake/Output Summary (Last 24 hours) at 07/13/2017 1351 Last data filed at 07/13/2017 0055 Gross per 24 hour  Intake 2100 ml  Output 700 ml  Net 1400 ml   Filed Weights   07/13/17 0643  Weight: 66.2 kg (145 lb 15.1 oz)    General appearance: alert, cooperative, appears stated age and no distress Head: Normocephalic, without obvious abnormality, atraumatic Resp: Diminished air entry at the bases.  No definite wheezing rales or rhonchi.  Normal effort at rest. Cardio: regular rate and rhythm, S1, S2 normal, no murmur, click, rub or gallop GI: soft, non-tender; bowel sounds normal; no masses,  no organomegaly Extremities: extremities normal, atraumatic, no cyanosis or edema Neurologic: Awake alert.  Oriented x3.  No focal neurological deficits noted.  Lab  Results:  Data Reviewed: I have personally reviewed following labs and imaging studies  CBC: Recent Labs  Lab 07/12/17 2236 07/13/17 0126  WBC 17.4*  --   NEUTROABS 13.1*  --   HGB 13.7 11.6*  HCT 41.2 34.0*  MCV 92.6  --   PLT 256  --     Basic Metabolic Panel: Recent Labs  Lab 07/13/17 0126 07/13/17 0543  NA 140 139  K 3.8 3.6  CL 109 107  CO2  --  24  GLUCOSE 97 102*  BUN 21* 20  CREATININE 1.00 0.93  CALCIUM  --  8.1*    GFR: Estimated Creatinine Clearance: 44.1 mL/min (by C-G formula based on SCr of 0.93 mg/dL).  Liver Function Tests: Recent Labs  Lab 07/13/17 0543  AST 23  ALT 19  ALKPHOS 69  BILITOT 1.0  PROT 6.1*  ALBUMIN 3.1*     Radiology Studies: Ct Chest W Contrast  Result Date: 07/13/2017 CLINICAL DATA:  Fever. Generalized weakness. Abnormal chest radiograph. EXAM: CT CHEST WITH CONTRAST TECHNIQUE: Multidetector CT imaging of the chest was performed during intravenous contrast administration. CONTRAST:  75mL ISOVUE-300 IOPAMIDOL (ISOVUE-300) INJECTION 61% COMPARISON:  Chest radiograph earlier this day.  Chest CT 09/01/2015 FINDINGS: Cardiovascular: Tortuous thoracic aorta with diffuse calcified and noncalcified irregular atheromatous plaque. Greatest aortic dimension about the proximal descending measuring 3.7 cm, unchanged. Atherosclerotic disease at the origin of the great vessels again seen. No aortic dissection or evidence of acute aortic abnormality. Mild cardiomegaly  dense coronary artery calcifications. Dilatation of the main pulmonary artery measuring 3.4 cm. No central pulmonary embolus. No pericardial effusion. Mediastinum/Nodes: Enlarged lower paratracheal node measuring 16 mm short axis. Small but not enlarged right hilar nodes, largest in the infrahilar region measuring 7 mm short axis. No left hilar adenopathy. The esophagus is decompressed. No thyroid nodule. Lungs/Pleura: Right upper lobe airspace disease with patchy, ground-glass, and  nodular/consolidative components. Findings are most prominent in the perihilar/basilar portion of the upper lobe, however there is no central obstructing lesion. Right upper lobe bronchial thickening. No significant debris in the trachea or mainstem bronchi. Biapical pleuroparenchymal scarring, partially calcified. Minimal subpleural reticulation in the right lower lobe and left upper lobe. No pulmonary nodules demonstrated elsewhere. No pleural fluid. Upper Abdomen: Abdominal aortic aneurysm only partially included, unchanged were visualized. Again seen right renal cyst and cortical scarring of the upper left kidney. No acute finding. Musculoskeletal: T8 compression fracture with vertebral augmentation. Spinal stimulator with tips posterior to T7. No acute osseous abnormalities. No evidence of focal osseous lesion. IMPRESSION: 1. Right upper lobe airspace disease with patchy, ground-glass, nodular/consolidative components. Differential consideration of infection versus neoplasm. Given fever, recommend short interval follow-up after a trial of antibiotic therapy to evaluate for resolution. No central obstructing lesion. 2. Enlarged lower paratracheal lymph node is nonspecific. 3. Advanced irregular aortic atherosclerosis, similar to 09/01/2015 CTA. Coronary artery calcifications. 4. Dilatation of the main pulmonary artery suggesting pulmonary arterial hypertension. Aortic Atherosclerosis (ICD10-I70.0). Electronically Signed   By: Rubye Oaks M.D.   On: 07/13/2017 03:33   Dg Chest Port 1 View  Result Date: 07/12/2017 CLINICAL DATA:  Congestion and fever.  Weakness and fatigue. EXAM: PORTABLE CHEST 1 VIEW COMPARISON:  03/31/2016 FINDINGS: Mild patient rotation. Mild cardiomegaly with tortuous thoracic aorta, atherosclerosis of the arch. There is volume loss in the right lung with patchy right suprahilar airspace disease. Minimal left lung base atelectasis. No large pleural effusion. No pneumothorax. Vertebral  augmentation in the midthoracic spine. IMPRESSION: 1. Right lung volume loss. Patchy opacity in the right upper lobe. Findings can be seen with central obstructing lesion. Recommend chest CT, preferably with contrast. Alternatively, upper lobe opacities may be secondary to pneumonia. 2. Cardiomegaly with tortuous atherosclerotic thoracic aorta. Electronically Signed   By: Rubye Oaks M.D.   On: 07/12/2017 23:29     Medications:  Scheduled: . amLODipine  10 mg Oral Daily  . aspirin EC  81 mg Oral Daily  . atorvastatin  20 mg Oral q1800  . azithromycin  500 mg Oral QHS  . donepezil  5 mg Oral QHS  . enoxaparin (LOVENOX) injection  40 mg Subcutaneous Q24H  . escitalopram  10 mg Oral Daily  . gabapentin  600 mg Oral 4 times per day  . gabapentin  900 mg Oral QHS  . HYDROcodone Bitartrate ER  40 mg Per post-pyloric tube QHS  . iopamidol      . metoprolol tartrate  25 mg Oral BID  . pantoprazole  40 mg Oral Daily  . sertraline  25 mg Oral Daily  . sodium chloride      . vitamin B-12  1,000 mcg Oral Daily   Continuous: . cefTRIAXone (ROCEPHIN)  IV     VHQ:IONGEXBM, HYDROcodone-acetaminophen  Assessment/Plan:  Principal Problem:   Community acquired pneumonia of right upper lobe of lung (HCC) Active Problems:   Hypertension   Chronic back pain   Generalized weakness    Community-acquired pneumonia No clear evidence for sepsis.  CT  scan showed changes in the right upper lobe.  Influenza PCR negative.  Follow-up on cultures.  Lactic acid level was initially mildly elevated and then normal.  Patient currently on ceftriaxone and azithromycin.  Continue current treatment for now.  Patient will need to repeat imaging studies in 4-6 weeks to ensure resolution of these findings.  Not noted to be hypoxic.  Mobilize.  History of essential hypertension Monitor blood pressures closely.  Continue home medications.  Chronic back pain Continue Neurontin.  Does not appear to be  experiencing any side effects of same.  Patient also on narcotics at home which have been continued.  Generalized weakness Most likely secondary to infectious process.  PT evaluation.  DVT Prophylaxis: Lovenox    Code Status: Full code Family Communication: Discussed with the patient and her family Disposition Plan: Management as outlined above.  PT evaluation.  Continue IV antibiotics.    LOS: 0 days   Osvaldo Shipper  [facility] Pager [phone] 07/13/2017, 1:51 PM  If 7PM-7AM, please contact night-coverage at [email or web address], password [facility]

## 2017-07-13 NOTE — H&P (Signed)
History and Physical    Sara [name] ZOX:[id number] DOB: 05-12-1940 DOA: 07/12/2017  PCP: Wanda Plump, MD  Patient coming from: Home  I have personally briefly reviewed patient's old medical records in [facility]  Chief Complaint: Generalized weakness  HPI: Sara [name] is a 77 y.o. female with medical history significant of chronic back pain, HTN, CAD.  Patient presents to the ED with c/o URI symptoms, progressive generalized weakness.  Patient was apparently doing okay last night but today she developed generalized weakness to the point she was unable to get up without assistance.   ED Course: Patient arrives with fever, Tm 100.4, WBC 17.4.  Has chills, no CP, no cough, no abd pain.  Has URI symptoms.  CT scan reveals RUL PNA.  Review of Systems: As per HPI otherwise 10 point review of systems negative.   Past Medical History:  Diagnosis Date  . Anxiety   . Arthritis   . Bronchitis    hx of  . Carotid artery occlusion   . Chronic pain syndrome    pain management  . Coronary artery disease    sees Dr. Patty Sermons  . GERD (gastroesophageal reflux disease)    hx of  . H/O hiatal hernia   . Hypercholesteremia   . Hypertension    all managed by Dr. Haynes Dage bill  . Mitral valve regurgitation    trace  . Non-traumatic compression fracture of thoracic vertebra (HCC)    T9  . Stroke [facility])     Past Surgical History:  Procedure Laterality Date  . BACK SURGERY     x 3 Dr Juliene Pina, last @ [facility] ~ 2005, rods, fusion, four surgery  . CARDIAC CATHETERIZATION  1994  . CAROTID ENDARTERECTOMY    . CARPAL TUNNEL RELEASE     left  . CHOLECYSTECTOMY    . ENDARTERECTOMY  04/24/2012   Procedure: ENDARTERECTOMY CAROTID;  Surgeon: Larina Earthly, MD;  Location: [facility] OR;  Service: Vascular;  Laterality: Left;  . HAND SURGERY     bilateral  . HIP FRACTURE SURGERY Left   . HIP PINNING,CANNULATED Left 02/08/2015   Procedure: CANNULATED HIP PINNING;  Surgeon: Kathryne Hitch, MD;  Location: [facility] ORS;  Service: Orthopedics;  Laterality: Left;  . HIP PINNING,CANNULATED Right 03/31/2016   Procedure: CANNULATED HIP PINNING;  Surgeon: Kathryne Hitch, MD;  Location: [facility] ORS;  Service: Orthopedics;  Laterality: Right;  . HYSTEROTOMY    . SPINAL CORD STIMULATOR INSERTION    . TEE WITHOUT CARDIOVERSION  04/12/2012   Procedure: TRANSESOPHAGEAL ECHOCARDIOGRAM (TEE);  Surgeon: Lewayne Bunting, MD;  Location: [facility] ENDOSCOPY;  Service: Cardiovascular;  Laterality: N/A;     reports that she has quit smoking. Her smoking use included cigarettes. She has a 30.00 pack-year smoking history. She uses smokeless tobacco. She reports that she does not drink alcohol or use drugs.  Allergies  Allergen Reactions  . Morphine And Related Rash  . Percocet [Oxycodone-Acetaminophen] Rash    Tolerates APAP (including IV), morphine, and Hydrocodone/APAP  . Hydrochlorothiazide     Pt does not remember  . Indomethacin     Pt does not remember  . Paroxetine Hcl     Pt does not remember  . Penicillins     Has patient had a PCN reaction causing immediate rash, facial/tongue/throat swelling, SOB or lightheadedness with hypotension: Yes Has patient had a PCN reaction causing severe rash involving mucus membranes or skin necrosis: No Has patient had a PCN  reaction that required hospitalization No Has patient had a PCN reaction occurring within the last 10 years: No If all of the above answers are "NO", then may proceed with Cephalosporin use.   . Pravachol     Pt does not remember  . Quinine Derivatives     Pt does not remember  . Wellbutrin [Bupropion]     agitation  . Zocor [Simvastatin - High Dose] Other (See Comments)    Weakness/no energy    Family History  Problem Relation Age of Onset  . Heart attack Father   . Heart disease Father        before age 81  . Heart disease Sister   . Cancer Sister   . Heart disease Sister     Prior to Admission medications     Medication Sig Start Date End Date Taking? Authorizing Provider  amLODipine (NORVASC) 10 MG tablet Take 1 tablet (10 mg total) by mouth daily. 05/24/17  Yes Wanda Plump, MD  aspirin EC 81 MG tablet Take 81 mg by mouth daily.   Yes [provider]  atorvastatin (LIPITOR) 40 MG tablet Take 0.5 tablets (20 mg total) by mouth daily. 05/24/17  Yes Paz, Nolon Rod, MD  donepezil (ARICEPT) 5 MG tablet Take 1 tablet (5 mg total) by mouth at bedtime. 06/27/17  Yes Paz, Nolon Rod, MD  escitalopram (LEXAPRO) 10 MG tablet Take 10 mg by mouth daily.  06/27/17  Yes [provider]  gabapentin (NEURONTIN) 300 MG capsule Take 900 mg by mouth at bedtime.   Yes [provider]  gabapentin (NEURONTIN) 600 MG tablet Take 1 tablet (600 mg total) by mouth 4 (four) times daily. 05/24/17  Yes Paz, Nolon Rod, MD  [facility] 40 MG T24A TK 1 T PO QD 09/16/16  Yes [provider]  metoprolol tartrate (LOPRESSOR) 50 MG tablet Take 0.5 tablets (25 mg total) by mouth 2 (two) times daily. 05/02/17  Yes Paz, Nolon Rod, MD  omeprazole (PRILOSEC) 20 MG capsule Take 1 capsule (20 mg total) by mouth daily. 01/17/17  Yes Paz, Nolon Rod, MD  sertraline (ZOLOFT) 25 MG tablet Take 25 mg by mouth daily.   Yes [provider]  diazepam (VALIUM) 5 MG tablet Take 1 tablet (5 mg total) by mouth daily as needed. Patient taking differently: Take 5 mg by mouth daily as needed for anxiety.  01/03/17   Wanda Plump, MD  HYDROcodone-acetaminophen [facility]) 10-325 MG tablet Take 1 tablet by mouth every 4 (four) hours as needed for moderate pain (for pain). 04/14/16   Kirtland Bouchard, PA-C  vitamin B-12 (CYANOCOBALAMIN) 1000 MCG tablet Take 1,000 mcg by mouth daily.    [provider]    Physical Exam: Vitals:   07/13/17 0008 07/13/17 0015 07/13/17 0130 07/13/17 0300  BP: (!) 174/64  (!) 169/70 (!) 159/64  Pulse: 63  64 65  Resp: 15  15 19   Temp:  98.6 F (37 C)    TempSrc:  Oral    SpO2: 98%  93% 92%     Constitutional: NAD, calm, comfortable Eyes: PERRL, lids and conjunctivae normal ENMT: Mucous membranes are moist. Posterior pharynx clear of any exudate or lesions.Normal dentition.  Neck: normal, supple, no masses, no thyromegaly Respiratory: clear to auscultation bilaterally, no wheezing, no crackles. Normal respiratory effort. No accessory muscle use.  Cardiovascular: Regular rate and rhythm, no murmurs / rubs / gallops. No extremity edema. 2+ pedal pulses. No carotid bruits.  Abdomen: no  tenderness, no masses palpated. No hepatosplenomegaly. Bowel sounds positive.  Musculoskeletal: no clubbing / cyanosis. No joint deformity upper and lower extremities. Good ROM, no contractures. Normal muscle tone.  Skin: no rashes, lesions, ulcers. No induration Neurologic: CN 2-12 grossly intact. Sensation intact, DTR normal. Strength 5/5 in all 4.  Psychiatric: Normal judgment and insight. Alert and oriented x 3. Normal mood.    Labs on Admission: I have personally reviewed following labs and imaging studies  CBC: Recent Labs  Lab 07/12/17 2236 07/13/17 0126  WBC 17.4*  --   NEUTROABS 13.1*  --   HGB 13.7 11.6*  HCT 41.2 34.0*  MCV 92.6  --   PLT 256  --    Basic Metabolic Panel: Recent Labs  Lab 07/13/17 0126  NA 140  K 3.8  CL 109  GLUCOSE 97  BUN 21*  CREATININE 1.00   GFR: CrCl cannot be calculated (Unknown ideal weight.). Liver Function Tests: No results for input(s): AST, ALT, ALKPHOS, BILITOT, PROT, ALBUMIN in the last 168 hours. No results for input(s): LIPASE, AMYLASE in the last 168 hours. No results for input(s): AMMONIA in the last 168 hours. Coagulation Profile: No results for input(s): INR, PROTIME in the last 168 hours. Cardiac Enzymes: No results for input(s): CKTOTAL, CKMB, CKMBINDEX, TROPONINI in the last 168 hours. BNP (last 3 results) No results for input(s): PROBNP in the last 8760 hours. HbA1C: No results for input(s): HGBA1C in the last 72  hours. CBG: No results for input(s): GLUCAP in the last 168 hours. Lipid Profile: No results for input(s): CHOL, HDL, LDLCALC, TRIG, CHOLHDL, LDLDIRECT in the last 72 hours. Thyroid Function Tests: No results for input(s): TSH, T4TOTAL, FREET4, T3FREE, THYROIDAB in the last 72 hours. Anemia Panel: No results for input(s): VITAMINB12, FOLATE, FERRITIN, TIBC, IRON, RETICCTPCT in the last 72 hours. Urine analysis:    Component Value Date/Time   COLORURINE YELLOW 07/12/2017 2358   APPEARANCEUR CLEAR 07/12/2017 2358   LABSPEC 1.014 07/12/2017 2358   PHURINE 7.0 07/12/2017 2358   GLUCOSEU NEGATIVE 07/12/2017 2358   GLUCOSEU NEGATIVE 06/03/2015 1616   HGBUR NEGATIVE 07/12/2017 2358   BILIRUBINUR NEGATIVE 07/12/2017 2358   BILIRUBINUR Negative 05/20/2017 1505   KETONESUR NEGATIVE 07/12/2017 2358   PROTEINUR NEGATIVE 07/12/2017 2358   UROBILINOGEN 0.2 05/20/2017 1505   UROBILINOGEN 0.2 06/03/2015 1616   NITRITE NEGATIVE 07/12/2017 2358   LEUKOCYTESUR NEGATIVE 07/12/2017 2358    Radiological Exams on Admission: Ct Chest W Contrast  Result Date: 07/13/2017 CLINICAL DATA:  Fever. Generalized weakness. Abnormal chest radiograph. EXAM: CT CHEST WITH CONTRAST TECHNIQUE: Multidetector CT imaging of the chest was performed during intravenous contrast administration. CONTRAST:  75mL ISOVUE-300 IOPAMIDOL (ISOVUE-300) INJECTION 61% COMPARISON:  Chest radiograph earlier this day.  Chest CT 09/01/2015 FINDINGS: Cardiovascular: Tortuous thoracic aorta with diffuse calcified and noncalcified irregular atheromatous plaque. Greatest aortic dimension about the proximal descending measuring 3.7 cm, unchanged. Atherosclerotic disease at the origin of the great vessels again seen. No aortic dissection or evidence of acute aortic abnormality. Mild cardiomegaly dense coronary artery calcifications. Dilatation of the main pulmonary artery measuring 3.4 cm. No central pulmonary embolus. No pericardial effusion.  Mediastinum/Nodes: Enlarged lower paratracheal node measuring 16 mm short axis. Small but not enlarged right hilar nodes, largest in the infrahilar region measuring 7 mm short axis. No left hilar adenopathy. The esophagus is decompressed. No thyroid nodule. Lungs/Pleura: Right upper lobe airspace disease with patchy, ground-glass, and nodular/consolidative components. Findings are most prominent in the perihilar/basilar portion of  the upper lobe, however there is no central obstructing lesion. Right upper lobe bronchial thickening. No significant debris in the trachea or mainstem bronchi. Biapical pleuroparenchymal scarring, partially calcified. Minimal subpleural reticulation in the right lower lobe and left upper lobe. No pulmonary nodules demonstrated elsewhere. No pleural fluid. Upper Abdomen: Abdominal aortic aneurysm only partially included, unchanged were visualized. Again seen right renal cyst and cortical scarring of the upper left kidney. No acute finding. Musculoskeletal: T8 compression fracture with vertebral augmentation. Spinal stimulator with tips posterior to T7. No acute osseous abnormalities. No evidence of focal osseous lesion. IMPRESSION: 1. Right upper lobe airspace disease with patchy, ground-glass, nodular/consolidative components. Differential consideration of infection versus neoplasm. Given fever, recommend short interval follow-up after a trial of antibiotic therapy to evaluate for resolution. No central obstructing lesion. 2. Enlarged lower paratracheal lymph node is nonspecific. 3. Advanced irregular aortic atherosclerosis, similar to 09/01/2015 CTA. Coronary artery calcifications. 4. Dilatation of the main pulmonary artery suggesting pulmonary arterial hypertension. Aortic Atherosclerosis (ICD10-I70.0). Electronically Signed   By: Rubye OaksMelanie  Ehinger M.D.   On: 07/13/2017 03:33   Dg Chest Port 1 View  Result Date: 07/12/2017 CLINICAL DATA:  Congestion and fever.  Weakness and fatigue.  EXAM: PORTABLE CHEST 1 VIEW COMPARISON:  03/31/2016 FINDINGS: Mild patient rotation. Mild cardiomegaly with tortuous thoracic aorta, atherosclerosis of the arch. There is volume loss in the right lung with patchy right suprahilar airspace disease. Minimal left lung base atelectasis. No large pleural effusion. No pneumothorax. Vertebral augmentation in the midthoracic spine. IMPRESSION: 1. Right lung volume loss. Patchy opacity in the right upper lobe. Findings can be seen with central obstructing lesion. Recommend chest CT, preferably with contrast. Alternatively, upper lobe opacities may be secondary to pneumonia. 2. Cardiomegaly with tortuous atherosclerotic thoracic aorta. Electronically Signed   By: Rubye OaksMelanie  Ehinger M.D.   On: 07/12/2017 23:29    EKG: Independently reviewed.  Assessment/Plan Principal Problem:   Community acquired pneumonia of right upper lobe of lung (HCC) Active Problems:   Hypertension   Chronic back pain   Generalized weakness    1. CAP - 1. PNA pathway 2. Rocephin / azithro 3. Cultures pending 2. HTN -  1. Continue home BP meds 3. Chronic back pain - 1. Continue neurontin 2. Continue home opiates 4. Generalized weakness - suspected to be due to issue #1 above.  DVT prophylaxis: Lovenox Code Status: Full Family Communication: Daughter at bedside Disposition Plan: TBD Consults called: None Admission status: Admit to inpatient   Hillary BowGARDNER, JARED M. DO [facility] Pager [phone]  If 7AM-7PM, please contact day team taking care of patient [email or web address] Password [email or web address]  07/13/2017, 4:13 AM

## 2017-07-13 NOTE — ED Notes (Signed)
ED TO INPATIENT HANDOFF REPORT  Name/Age/Gender Sara [name] 77 y.o. female  Code Status    Code Status Orders  (From admission, onward)        Start     Ordered   07/13/17 0348  Full code  Continuous     07/13/17 0349    Code Status History    Date Active Date Inactive Code Status Order ID Comments User Context   03/31/2016 06:09 04/02/2016 17:38 Full Code [id number]  Clydie BraunSmith, Rondell A, MD ED   06/04/2015 23:16 06/07/2015 15:48 Full Code [id number]  Ron ParkerJenkins, Harvette C, MD Inpatient   02/08/2015 10:58 02/10/2015 16:04 Full Code [id number]  Kathryne HitchBlackman, Christopher Y, MD Inpatient   02/07/2015 17:16 02/08/2015 10:58 Full Code [id number]  Jerald Kiefhiu, Stephen K, MD ED   04/24/2012 13:42 04/25/2012 12:42 Full Code [id number]  Beryle QuantSchiller, Carole Yolande, RN Inpatient   04/10/2012 23:02 04/13/2012 17:40 Full Code [id number]  Zollie BeckersShaw, Mildred A, RN Inpatient      Home/SNF/Other Home  Chief Complaint Possible UTI  Level of Care/Admitting Diagnosis ED Disposition    ED Disposition Condition Comment   Ad[facility]  [facility][id number]  Level of Care: Med-Surg [16]  Diagnosis: Community acquired pneumonia of right upper lobe of lung [facility](HCC) [[id number]]) [[id number]]  Admitting Physician: Wyvonnia DuskyGARDNER, JARED M [[id number]]  Attending Physician: Hillary BowGARDNER, JARED M [phone]]  Estimated length of stay: past midnight tomorrow  Certification:: I certify this patient will need inpatient services for at least 2 midnights  PT Class (Do Not Modify): Inpatient [101]  PT Acc Code (Do Not Modify): Private [1]       Medical History Past Medical History:  Diagnosis Date  . Anxiety   . Arthritis   . Bronchitis    hx of  . Carotid artery occlusion   . Chronic pain syndrome    pain management  . Coronary artery disease    sees Dr. Patty SermonsBrackbill  . GERD (gastroesophageal reflux disease)    hx of  . H/O hiatal hernia   . Hypercholesteremia   . Hypertension    all managed by Dr. Haynes DageBrack bill  . Mitral valve  regurgitation    trace  . Non-traumatic compression fracture of thoracic vertebra (HCC)    T9  . Stroke [facility](HCC)     Allergies Allergies  Allergen Reactions  . Morphine And Related Rash  . Percocet [Oxycodone-Acetaminophen] Rash    Tolerates APAP (including IV), morphine, and Hydrocodone/APAP  . Hydrochlorothiazide     Pt does not remember  . Indomethacin     Pt does not remember  . Paroxetine Hcl     Pt does not remember  . Penicillins     Has patient had a PCN reaction causing immediate rash, facial/tongue/throat swelling, SOB or lightheadedness with hypotension: Yes Has patient had a PCN reaction causing severe rash involving mucus membranes or skin necrosis: No Has patient had a PCN reaction that required hospitalization No Has patient had a PCN reaction occurring within the last 10 years: No If all of the above answers are "NO", then may proceed with Cephalosporin use.   . Pravachol     Pt does not remember  . Quinine Derivatives     Pt does not remember  . Wellbutrin [Bupropion]     agitation  . Zocor [Simvastatin - High Dose] Other (See Comments)    Weakness/no energy    IV Location/Drains/Wounds Patient Lines/Drains/Airways Status   Active Line/Drains/Airways    Name:   Placement date:  Placement time:   Site:   Days:   Peripheral IV 07/12/17 Left Antecubital   07/12/17    2305    Antecubital   1   Peripheral IV 07/13/17 Right Forearm   07/13/17    0009    Forearm   less than 1   Incision (Closed) 02/08/15 Hip Left   02/08/15    0827     886   Incision (Closed) 03/31/16 Hip Right   03/31/16    1832     469          Labs/Imaging Results for orders placed or performed during the hospital encounter of 07/12/17 (from the past 48 hour(s))  CBC WITH DIFFERENTIAL     Status: Abnormal   Collection Time: 07/12/17 10:36 PM  Result Value Ref Range   WBC 17.4 (H) 4.0 - 10.5 K/uL   RBC 4.45 3.87 - 5.11 MIL/uL   Hemoglobin 13.7 12.0 - 15.0 g/dL   HCT 78.2 95.6 - 21.3  %   MCV 92.6 78.0 - 100.0 fL   MCH 30.8 26.0 - 34.0 pg   MCHC 33.3 30.0 - 36.0 g/dL   RDW 08.6 57.8 - 46.9 %   Platelets 256 150 - 400 K/uL   Neutrophils Relative % 75 %   Neutro Abs 13.1 (H) 1.7 - 7.7 K/uL   Lymphocytes Relative 18 %   Lymphs Abs 3.2 0.7 - 4.0 K/uL   Monocytes Relative 6 %   Monocytes Absolute 1.0 0.1 - 1.0 K/uL   Eosinophils Relative 1 %   Eosinophils Absolute 0.1 0.0 - 0.7 K/uL   Basophils Relative 0 %   Basophils Absolute 0.0 0.0 - 0.1 K/uL    Comment: Performed at [facility], [address], [place], [place] [address]  I-Stat CG4 Lactic Acid, ED  (not at  [facility])     Status: Abnormal   Collection Time: 07/12/17 11:19 PM  Result Value Ref Range   Lactic Acid, Venous 2.13 (HH) 0.5 - 1.9 mmol/L   Comment NOTIFIED PHYSICIAN   Urinalysis, Routine w reflex microscopic     Status: None   Collection Time: 07/12/17 11:58 PM  Result Value Ref Range   Color, Urine YELLOW YELLOW   APPearance CLEAR CLEAR   Specific Gravity, Urine 1.014 1.005 - 1.030   pH 7.0 5.0 - 8.0   Glucose, UA NEGATIVE NEGATIVE mg/dL   Hgb urine dipstick NEGATIVE NEGATIVE   Bilirubin Urine NEGATIVE NEGATIVE   Ketones, ur NEGATIVE NEGATIVE mg/dL   Protein, ur NEGATIVE NEGATIVE mg/dL   Nitrite NEGATIVE NEGATIVE   Leukocytes, UA NEGATIVE NEGATIVE    Comment: Performed at [facility], [address], [place], [place] [address]  Influenza panel by PCR (type A & B)     Status: None   Collection Time: 07/13/17 12:30 AM  Result Value Ref Range   Influenza A By PCR NEGATIVE NEGATIVE   Influenza B By PCR NEGATIVE NEGATIVE    Comment: (NOTE) The Xpert Xpress Flu assay is intended as an aid in the diagnosis of  influenza and should not be used as a sole basis for treatment.  This  assay is FDA approved for nasopharyngeal swab specimens only. Nasal  washings and aspirates are unacceptable for Xpert Xpress Flu testing. Performed at [facility],  [address] [place], [place] [address]   I-Stat CG4 Lactic Acid, ED  (not at  [facility])     Status: None   Collection  Time: 07/13/17 12:33 AM  Result Value Ref Range   Lactic Acid, Venous 1.10 0.5 - 1.9 mmol/L  I-stat chem 8, ed     Status: Abnormal   Collection Time: 07/13/17  1:26 AM  Result Value Ref Range   Sodium 140 135 - 145 mmol/L   Potassium 3.8 3.5 - 5.1 mmol/L   Chloride 109 101 - 111 mmol/L   BUN 21 (H) 6 - 20 mg/dL   Creatinine, Ser 1.61 0.44 - 1.00 mg/dL   Glucose, Bld 97 65 - 99 mg/dL   Calcium, Ion 0.96 (L) 1.15 - 1.40 mmol/L   TCO2 22 22 - 32 mmol/L   Hemoglobin 11.6 (L) 12.0 - 15.0 g/dL   HCT 04.5 (L) 40.9 - 81.1 %   Ct Chest W Contrast  Result Date: 07/13/2017 CLINICAL DATA:  Fever. Generalized weakness. Abnormal chest radiograph. EXAM: CT CHEST WITH CONTRAST TECHNIQUE: Multidetector CT imaging of the chest was performed during intravenous contrast administration. CONTRAST:  75mL ISOVUE-300 IOPAMIDOL (ISOVUE-300) INJECTION 61% COMPARISON:  Chest radiograph earlier this day.  Chest CT 09/01/2015 FINDINGS: Cardiovascular: Tortuous thoracic aorta with diffuse calcified and noncalcified irregular atheromatous plaque. Greatest aortic dimension about the proximal descending measuring 3.7 cm, unchanged. Atherosclerotic disease at the origin of the great vessels again seen. No aortic dissection or evidence of acute aortic abnormality. Mild cardiomegaly dense coronary artery calcifications. Dilatation of the main pulmonary artery measuring 3.4 cm. No central pulmonary embolus. No pericardial effusion. Mediastinum/Nodes: Enlarged lower paratracheal node measuring 16 mm short axis. Small but not enlarged right hilar nodes, largest in the infrahilar region measuring 7 mm short axis. No left hilar adenopathy. The esophagus is decompressed. No thyroid nodule. Lungs/Pleura: Right upper lobe airspace disease with patchy, ground-glass, and nodular/consolidative components. Findings are most  prominent in the perihilar/basilar portion of the upper lobe, however there is no central obstructing lesion. Right upper lobe bronchial thickening. No significant debris in the trachea or mainstem bronchi. Biapical pleuroparenchymal scarring, partially calcified. Minimal subpleural reticulation in the right lower lobe and left upper lobe. No pulmonary nodules demonstrated elsewhere. No pleural fluid. Upper Abdomen: Abdominal aortic aneurysm only partially included, unchanged were visualized. Again seen right renal cyst and cortical scarring of the upper left kidney. No acute finding. Musculoskeletal: T8 compression fracture with vertebral augmentation. Spinal stimulator with tips posterior to T7. No acute osseous abnormalities. No evidence of focal osseous lesion. IMPRESSION: 1. Right upper lobe airspace disease with patchy, ground-glass, nodular/consolidative components. Differential consideration of infection versus neoplasm. Given fever, recommend short interval follow-up after a trial of antibiotic therapy to evaluate for resolution. No central obstructing lesion. 2. Enlarged lower paratracheal lymph node is nonspecific. 3. Advanced irregular aortic atherosclerosis, similar to 09/01/2015 CTA. Coronary artery calcifications. 4. Dilatation of the main pulmonary artery suggesting pulmonary arterial hypertension. Aortic Atherosclerosis (ICD10-I70.0). Electronically Signed   By: Rubye Oaks M.D.   On: 07/13/2017 03:33   Dg Chest Port 1 View  Result Date: 07/12/2017 CLINICAL DATA:  Congestion and fever.  Weakness and fatigue. EXAM: PORTABLE CHEST 1 VIEW COMPARISON:  03/31/2016 FINDINGS: Mild patient rotation. Mild cardiomegaly with tortuous thoracic aorta, atherosclerosis of the arch. There is volume loss in the right lung with patchy right suprahilar airspace disease. Minimal left lung base atelectasis. No large pleural effusion. No pneumothorax. Vertebral augmentation in the midthoracic spine. IMPRESSION:  1. Right lung volume loss. Patchy opacity in the right upper lobe. Findings can be seen with central obstructing lesion. Recommend chest CT, preferably with contrast.  Alternatively, upper lobe opacities may be secondary to pneumonia. 2. Cardiomegaly with tortuous atherosclerotic thoracic aorta. Electronically Signed   By: Rubye Oaks M.D.   On: 07/12/2017 23:29    Pending Labs Unresulted Labs (From admission, onward)   Start     Ordered   07/13/17 0344  Culture, sputum-assessment  Once,   R     07/13/17 0349   07/13/17 0344  Gram stain  Once,   R     07/13/17 0349   07/13/17 0344  HIV antibody (Routine Screening)  Once,   R     07/13/17 0349   07/13/17 0344  Strep pneumoniae urinary antigen  Once,   R     07/13/17 0349   07/13/17 0050  Comprehensive metabolic panel  Once,   R     16/10/96 0050   07/12/17 2236  Blood Culture (routine x 2)  BLOOD CULTURE X 2,   STAT     07/12/17 2237   07/12/17 2236  Urine culture  STAT,   STAT     07/12/17 2237      Vitals/Pain Today's Vitals   07/13/17 0015 07/13/17 0130 07/13/17 0218 07/13/17 0300  BP:  (!) 169/70  (!) 159/64  Pulse:  64  65  Resp:  15  19  Temp: 98.6 F (37 C)     TempSrc: Oral     SpO2:  93%  92%  PainSc:   8      Isolation Precautions Droplet precaution  Medications Medications  sodium chloride 0.9 % injection (not administered)  iopamidol (ISOVUE-300) 61 % injection (not administered)  azithromycin (ZITHROMAX) 500 mg in sodium chloride 0.9 % 250 mL IVPB (500 mg Intravenous New Bag/Given 07/13/17 0348)  aspirin EC tablet 81 mg (not administered)  amLODipine (NORVASC) tablet 10 mg (not administered)  atorvastatin (LIPITOR) tablet 20 mg (not administered)  diazepam (VALIUM) tablet 5 mg (not administered)  gabapentin (NEURONTIN) tablet 600 mg (not administered)  gabapentin (NEURONTIN) capsule 900 mg (not administered)  escitalopram (LEXAPRO) tablet 10 mg (not administered)  donepezil (ARICEPT) tablet 5 mg (not  administered)  metoprolol tartrate (LOPRESSOR) tablet 25 mg (not administered)  HYDROcodone-acetaminophen (NORCO) 10-325 MG per tablet 1 tablet (not administered)  pantoprazole (PROTONIX) EC tablet 40 mg (not administered)  sertraline (ZOLOFT) tablet 25 mg (not administered)  vitamin B-12 (CYANOCOBALAMIN) tablet 1,000 mcg (not administered)  enoxaparin (LOVENOX) injection 40 mg (not administered)  cefTRIAXone (ROCEPHIN) 1 g in sodium chloride 0.9 % 100 mL IVPB (not administered)  azithromycin (ZITHROMAX) tablet 500 mg (not administered)  HYDROcodone Bitartrate ER T24A 40 mg (not administered)  sodium chloride 0.9 % bolus 1,000 mL (0 mLs Intravenous Stopped 07/13/17 0025)    And  sodium chloride 0.9 % bolus 1,000 mL (0 mLs Intravenous Stopped 07/13/17 0025)  cefTRIAXone (ROCEPHIN) 1 g in sodium chloride 0.9 % 100 mL IVPB (0 g Intravenous Stopped 07/13/17 0045)  iopamidol (ISOVUE-300) 61 % injection 75 mL (75 mLs Intravenous Contrast Given 07/13/17 0231)  ibuprofen (ADVIL,MOTRIN) tablet 800 mg (800 mg Oral Given 07/13/17 0335)    Mobility non-ambulatory, currently due to weakness

## 2017-07-13 NOTE — ED Notes (Signed)
Only one set of cultures was obtained. Pt stuck many times and another set was not able to be obtained.

## 2017-07-14 ENCOUNTER — Inpatient Hospital Stay (HOSPITAL_COMMUNITY): Payer: Medicare Other

## 2017-07-14 ENCOUNTER — Encounter (HOSPITAL_COMMUNITY): Payer: Self-pay

## 2017-07-14 DIAGNOSIS — J181 Lobar pneumonia, unspecified organism: Principal | ICD-10-CM

## 2017-07-14 DIAGNOSIS — R1310 Dysphagia, unspecified: Secondary | ICD-10-CM

## 2017-07-14 DIAGNOSIS — M549 Dorsalgia, unspecified: Secondary | ICD-10-CM | POA: Diagnosis not present

## 2017-07-14 DIAGNOSIS — G8929 Other chronic pain: Secondary | ICD-10-CM | POA: Diagnosis not present

## 2017-07-14 DIAGNOSIS — R531 Weakness: Secondary | ICD-10-CM | POA: Diagnosis not present

## 2017-07-14 LAB — URINE CULTURE: CULTURE: NO GROWTH

## 2017-07-14 LAB — BASIC METABOLIC PANEL
ANION GAP: 10 (ref 5–15)
BUN: 20 mg/dL (ref 6–20)
CHLORIDE: 106 mmol/L (ref 101–111)
CO2: 25 mmol/L (ref 22–32)
Calcium: 8.2 mg/dL — ABNORMAL LOW (ref 8.9–10.3)
Creatinine, Ser: 1.17 mg/dL — ABNORMAL HIGH (ref 0.44–1.00)
GFR calc Af Amer: 50 mL/min — ABNORMAL LOW (ref >60)
GFR calc non Af Amer: 43 mL/min — ABNORMAL LOW (ref >60)
GLUCOSE: 111 mg/dL — AB (ref 65–99)
POTASSIUM: 3.5 mmol/L (ref 3.5–5.1)
Sodium: 141 mmol/L (ref 135–145)

## 2017-07-14 LAB — CBC
HCT: 37.3 % (ref 36.0–46.0)
Hemoglobin: 11.8 g/dL — ABNORMAL LOW (ref 12.0–15.0)
MCH: 29.5 pg (ref 26.0–34.0)
MCHC: 31.6 g/dL (ref 30.0–36.0)
MCV: 93.3 fL (ref 78.0–100.0)
PLATELETS: 229 10*3/uL (ref 150–400)
RBC: 4 MIL/uL (ref 3.87–5.11)
RDW: 13.8 % (ref 11.5–15.5)
WBC: 7.8 10*3/uL (ref 4.0–10.5)

## 2017-07-14 MED ORDER — SODIUM CHLORIDE 0.45 % IV SOLN
INTRAVENOUS | Status: DC
  Administered 2017-07-14: 11:00:00 via INTRAVENOUS

## 2017-07-14 MED ORDER — DIPHENHYDRAMINE HCL 25 MG PO CAPS
25.0000 mg | ORAL_CAPSULE | Freq: Four times a day (QID) | ORAL | Status: DC | PRN
  Administered 2017-07-14 – 2017-07-16 (×5): 25 mg via ORAL
  Filled 2017-07-14 (×6): qty 1

## 2017-07-14 MED ORDER — BISACODYL 5 MG PO TBEC
5.0000 mg | DELAYED_RELEASE_TABLET | Freq: Every day | ORAL | Status: DC | PRN
Start: 2017-07-14 — End: 2017-07-16

## 2017-07-14 MED ORDER — DOCUSATE SODIUM 100 MG PO CAPS
100.0000 mg | ORAL_CAPSULE | Freq: Two times a day (BID) | ORAL | Status: DC
  Administered 2017-07-14 – 2017-07-16 (×4): 100 mg via ORAL
  Filled 2017-07-14 (×4): qty 1

## 2017-07-14 MED ORDER — DIPHENHYDRAMINE HCL 25 MG PO CAPS
25.0000 mg | ORAL_CAPSULE | Freq: Three times a day (TID) | ORAL | Status: DC | PRN
Start: 2017-07-14 — End: 2017-07-14

## 2017-07-14 MED ORDER — FLEET ENEMA 7-19 GM/118ML RE ENEM: 1.0000 | ENEMA | Freq: Every day | RECTAL | Status: DC | PRN

## 2017-07-14 MED ORDER — POLYETHYLENE GLYCOL 3350 17 G PO PACK: 17.0000 g | PACK | Freq: Every day | ORAL | Status: DC | PRN

## 2017-07-14 NOTE — Progress Notes (Signed)
[facility] PROGRESS NOTE  Sara [name] EAV:[id number] DOB: 04/04/1941 DOA: 07/12/2017  PCP: Sara PlumpPaz, Jose E, MD  Brief History/Interval Summary: 77 year old female with a past medical history of chronic back pain, hypertension, coronary artery disease presented with complains of shortness of breath and generalized weakness.  Evaluation raised concern for pneumonia.  Patient was hospitalized for further management.  Reason for Visit: Community-acquired pneumonia  Consultants: None  Procedures: None  Antibiotics: Ceftriaxone and azithromycin  Subjective/Interval History: Patient states that she feels worse this morning.  She had 2 episodes of vomiting yesterday.  She mentions that she is been having difficulty swallowing solids for the last few months.  Feels as if the food gets stuck in her chest.  Breathing is worsened overnight but she denies a cough.    ROS: Denies any chest pains.  Objective:  Vital Signs  Vitals:   07/13/17 2030 07/14/17 0418 07/14/17 0800 07/14/17 1010  BP:  (!) 141/68 (!) 145/73 (!) 152/68  Pulse:  68 84 79  Resp: 16 14 15    Temp: 99.5 F (37.5 C) 99 F (37.2 C) 99 F (37.2 C)   TempSrc: Oral Oral Oral   SpO2: 100% 100% 97%   Weight:      Height:        Intake/Output Summary (Last 24 hours) at 07/14/2017 1300 Last data filed at 07/14/2017 0800 Gross per 24 hour  Intake 120 ml  Output 450 ml  Net -330 ml   Filed Weights   07/13/17 0643  Weight: 66.2 kg (145 lb 15.1 oz)    General appearance: Awake alert.  In no distress.  Noted to be fatigued. Resp: Diminished air entry at the bases.  Few crackles present.  No wheezing or rhonchi. Cardio: S1-S2 normal regular.  No S3-S4.  No rubs murmurs of bruit GI: Abdomen is soft.  Nontender nondistended.  Bowel sounds are present normal Extremities: No edema Neurologic: Awake alert.  Oriented x3.  No focal neurological deficits.  Lab Results:  Data Reviewed: I have personally reviewed  following labs and imaging studies  CBC: Recent Labs  Lab 07/12/17 2236 07/13/17 0126 07/14/17 0423  WBC 17.4*  --  7.8  NEUTROABS 13.1*  --   --   HGB 13.7 11.6* 11.8*  HCT 41.2 34.0* 37.3  MCV 92.6  --  93.3  PLT 256  --  229    Basic Metabolic Panel: Recent Labs  Lab 07/13/17 0126 07/13/17 0543 07/14/17 0423  NA 140 139 141  K 3.8 3.6 3.5  CL 109 107 106  CO2  --  24 25  GLUCOSE 97 102* 111*  BUN 21* 20 20  CREATININE 1.00 0.93 1.17*  CALCIUM  --  8.1* 8.2*    GFR: Estimated Creatinine Clearance: 35.1 mL/min (A) (by C-G formula based on SCr of 1.17 mg/dL (H)).  Liver Function Tests: Recent Labs  Lab 07/13/17 0543  AST 23  ALT 19  ALKPHOS 69  BILITOT 1.0  PROT 6.1*  ALBUMIN 3.1*     Radiology Studies: Ct Chest W Contrast  Result Date: 07/13/2017 CLINICAL DATA:  Fever. Generalized weakness. Abnormal chest radiograph. EXAM: CT CHEST WITH CONTRAST TECHNIQUE: Multidetector CT imaging of the chest was performed during intravenous contrast administration. CONTRAST:  75mL ISOVUE-300 IOPAMIDOL (ISOVUE-300) INJECTION 61% COMPARISON:  Chest radiograph earlier this day.  Chest CT 09/01/2015 FINDINGS: Cardiovascular: Tortuous thoracic aorta with diffuse calcified and noncalcified irregular atheromatous plaque. Greatest aortic dimension about the proximal descending measuring 3.7 cm,  unchanged. Atherosclerotic disease at the origin of the great vessels again seen. No aortic dissection or evidence of acute aortic abnormality. Mild cardiomegaly dense coronary artery calcifications. Dilatation of the main pulmonary artery measuring 3.4 cm. No central pulmonary embolus. No pericardial effusion. Mediastinum/Nodes: Enlarged lower paratracheal node measuring 16 mm short axis. Small but not enlarged right hilar nodes, largest in the infrahilar region measuring 7 mm short axis. No left hilar adenopathy. The esophagus is decompressed. No thyroid nodule. Lungs/Pleura: Right upper lobe  airspace disease with patchy, ground-glass, and nodular/consolidative components. Findings are most prominent in the perihilar/basilar portion of the upper lobe, however there is no central obstructing lesion. Right upper lobe bronchial thickening. No significant debris in the trachea or mainstem bronchi. Biapical pleuroparenchymal scarring, partially calcified. Minimal subpleural reticulation in the right lower lobe and left upper lobe. No pulmonary nodules demonstrated elsewhere. No pleural fluid. Upper Abdomen: Abdominal aortic aneurysm only partially included, unchanged were visualized. Again seen right renal cyst and cortical scarring of the upper left kidney. No acute finding. Musculoskeletal: T8 compression fracture with vertebral augmentation. Spinal stimulator with tips posterior to T7. No acute osseous abnormalities. No evidence of focal osseous lesion. IMPRESSION: 1. Right upper lobe airspace disease with patchy, ground-glass, nodular/consolidative components. Differential consideration of infection versus neoplasm. Given fever, recommend short interval follow-up after a trial of antibiotic therapy to evaluate for resolution. No central obstructing lesion. 2. Enlarged lower paratracheal lymph node is nonspecific. 3. Advanced irregular aortic atherosclerosis, similar to 09/01/2015 CTA. Coronary artery calcifications. 4. Dilatation of the main pulmonary artery suggesting pulmonary arterial hypertension. Aortic Atherosclerosis (ICD10-I70.0). Electronically Signed   By: Rubye Oaks M.D.   On: 07/13/2017 03:33   Dg Chest Port 1 View  Result Date: 07/14/2017 CLINICAL DATA:  Shortness of breath.  Leg pain. EXAM: PORTABLE CHEST 1 VIEW COMPARISON:  Chest CT obtained yesterday and portable chest radiograph dated 07/13/2015. FINDINGS: The cardiac silhouette remains mildly enlarged. The aorta remains tortuous and calcified. Patchy opacity is again demonstrated in the right upper lobe, with improvement. The  left lung is clear. No pleural fluid seen. Thoracic spine neural stimulator leads are unchanged. Diffuse osteopenia. IMPRESSION: Improving right upper lobe pneumonia. Electronically Signed   By: Beckie Salts M.D.   On: 07/14/2017 10:32   Dg Chest Port 1 View  Result Date: 07/12/2017 CLINICAL DATA:  Congestion and fever.  Weakness and fatigue. EXAM: PORTABLE CHEST 1 VIEW COMPARISON:  03/31/2016 FINDINGS: Mild patient rotation. Mild cardiomegaly with tortuous thoracic aorta, atherosclerosis of the arch. There is volume loss in the right lung with patchy right suprahilar airspace disease. Minimal left lung base atelectasis. No large pleural effusion. No pneumothorax. Vertebral augmentation in the midthoracic spine. IMPRESSION: 1. Right lung volume loss. Patchy opacity in the right upper lobe. Findings can be seen with central obstructing lesion. Recommend chest CT, preferably with contrast. Alternatively, upper lobe opacities may be secondary to pneumonia. 2. Cardiomegaly with tortuous atherosclerotic thoracic aorta. Electronically Signed   By: Rubye Oaks M.D.   On: 07/12/2017 23:29     Medications:  Scheduled: . amLODipine  10 mg Oral Daily  . aspirin EC  81 mg Oral Daily  . atorvastatin  20 mg Oral q1800  . azithromycin  500 mg Oral QHS  . donepezil  5 mg Oral QHS  . enoxaparin (LOVENOX) injection  40 mg Subcutaneous Q24H  . escitalopram  10 mg Oral Daily  . gabapentin  600 mg Oral 4 times per day  . gabapentin  900 mg Oral QHS  . HYDROcodone Bitartrate ER  40 mg Per post-pyloric tube QHS  . metoprolol tartrate  25 mg Oral BID  . pantoprazole  40 mg Oral Daily  . sertraline  25 mg Oral Daily  . vitamin B-12  1,000 mcg Oral Daily   Continuous: . sodium chloride 50 mL/hr at 07/14/17 1056  . cefTRIAXone (ROCEPHIN)  IV Stopped (07/13/17 2359)   ZOX:WRUEAVWU, diphenhydrAMINE, HYDROcodone-acetaminophen, HYDROmorphone (DILAUDID) injection  Assessment/Plan:  Principal Problem:    Community acquired pneumonia of right upper lobe of lung (HCC) Active Problems:   Hypertension   Chronic back pain   Generalized weakness    Community-acquired pneumonia No clear evidence for sepsis.  CT scan showed changes in the right upper lobe.  Influenza PCR negative.  Cultures negative so far.  Lactic acid level was initially mildly elevated and then normal.  Patient feels worse today.  Chest x-ray repeated and actually shows improvement in the pneumonia.  Patient had 2 episodes of vomiting.  Wonder if she aspirated.  See below as well.  Continue ceftriaxone and azithromycin for now. Patient will need repeat imaging studies in 4-6 weeks to ensure resolution of these findings.  Not noted to be hypoxic.  Mobilize.  Dysphagia More to solid foods than liquids.  Appears to have an esophageal component.  Will order barium swallow.  History of essential hypertension Stable.  Continue home medications.  Mildly elevated creatinine Significance is unclear.  Monitor urine output.  Gentle hydration since she did vomit yesterday.  Recheck labs tomorrow.  Chronic back pain Continue Neurontin.  Does not appear to be experiencing any side effects of same.  Patient also on narcotics at home which have been continued.  Generalized weakness  Most likely secondary to infectious process.  PT evaluation.  DVT Prophylaxis: Lovenox    Code Status: Full code Family Communication: Discussed with the patient and her family Disposition Plan: Management as outlined above.  Await improvement.  Barium swallow.    LOS: 1 day   Osvaldo Shipper  [facility] Pager [phone] 07/14/2017, 1:00 PM  If 7PM-7AM, please contact night-coverage at [email or web address], password [facility] [place]

## 2017-07-14 NOTE — Evaluation (Signed)
Physical Therapy Evaluation Patient Details Name: Sara [name] MRN: [id number] DOB: 01/21/1941 Today's Date: 07/14/2017   History of Present Illness  77 year old female with a past medical history of chronic back pain with bilateral radiculopathy, hypertension, coronary artery disease presented with complains of shortness of breath and generalized weakness.  H/O CVA, CAD, HTN, Rt hip fx/pinning. Evaluation raised concern for pneumonia.  Patient was hospitalized for further management.    Clinical Impression  Pt admitted with above diagnosis. Pt currently with functional limitations due to the deficits listed below (see PT Problem List). Pt able to ambulate short distance with RW using a slow, unsteady, shuffling gait pattern. Pt was weak and had difficulty with all mobility requiring assistance. Pt will benefit from skilled PT to increase their independence and safety with mobility to allow discharge to the venue listed below.       Follow Up Recommendations Home health PT;Supervision - Intermittent;Supervision for mobility/OOB    Equipment Recommendations  Rolling walker with 5" wheels    Recommendations for Other Services       Precautions / Restrictions Precautions Precautions: Fall Restrictions Weight Bearing Restrictions: No      Mobility  Bed Mobility Overal bed mobility: Needs Assistance Bed Mobility: Sit to Sidelying     Supine to sit: Min assist   Sit to sidelying: Modified independent (Device/Increase time) General bed mobility comments: multiple attempts to sit enough upright requiring min assist; multiple attempts to scoot to EOB - mod indep  Transfers Overall transfer level: Needs assistance Equipment used: Rolling walker (2 wheeled) Transfers: Sit to/from [employer] Sit to Stand: Min assist Stand pivot transfers: Min assist       General transfer comment: multiple attempts to sit to stand; unsteady turning  RW  Ambulation/Gait Ambulation/Gait assistance: Min guard;Min assist Ambulation Distance (Feet): 50 Feet Assistive device: Rolling walker (2 wheeled) Gait Pattern/deviations: Step-to pattern;Decreased step length - right;Decreased step length - left;Decreased stance time - right;Decreased stride length;Shuffle;Trunk flexed;Narrow base of support     General Gait Details: slow, unsteady, shuffling gait requiring verbal cues to pick feet up to take longer steps; c/o pain in bilateral lower extremities and LBP.  Stairs            Wheelchair Mobility    Modified Rankin (Stroke Patients Only)       Balance Overall balance assessment: Needs assistance Sitting-balance support: Bilateral upper extremity supported Sitting balance-Leahy Scale: Fair     Standing balance support: Bilateral upper extremity supported;During functional activity Standing balance-Leahy Scale: Poor                               Pertinent Vitals/Pain Pain Assessment: No/denies pain(weak)    Home Living Family/patient expects to be discharged to:: Private residence Living Arrangements: Spouse/significant other Available Help at Discharge: Family;Available PRN/intermittently Type of Home: House Home Access: Stairs to enter Entrance Stairs-Rails: Right Entrance Stairs-Number of Steps: 4 w/o rail + 4 w/ rail on Rt Home Layout: One level Home Equipment: Walker - 4 wheels;Walker - standard;Cane - single point;Cane - quad;Shower seat;Bedside commode;Grab bars - toilet;Grab bars - tub/shower;Wheelchair - manual;Toilet riser      Prior Function Level of Independence: Independent with assistive device(s)         Comments: uses cane inside and rolling walker with seat outside     Hand Dominance        Extremity/Trunk Assessment   Upper Extremity Assessment Upper  Extremity Assessment: Generalized weakness    Lower Extremity Assessment Lower Extremity Assessment: Generalized  weakness       Communication   Communication: No difficulties  Cognition Arousal/Alertness: Awake/alert Behavior During Therapy: WFL for tasks assessed/performed Overall Cognitive Status: Within Functional Limits for tasks assessed                                 General Comments: appeared to be a little confused but was able to correct family members on equipment at home.      General Comments      Exercises     Assessment/Plan    PT Assessment Patient needs continued PT services  PT Problem List Decreased strength;Decreased mobility;Decreased coordination;Decreased activity tolerance;Decreased balance;Pain       PT Treatment Interventions DME instruction;Gait training;Functional mobility training;Balance training;Therapeutic exercise;Patient/family education;Therapeutic activities    PT Goals (Current goals can be found in the Care Plan section)  Acute Rehab PT Goals Patient Stated Goal: return home with HHPT to get stronger. PT Goal Formulation: With patient/family Time For Goal Achievement: 07/21/17 Potential to Achieve Goals: Fair    Frequency Min 3X/week   Barriers to discharge        Co-evaluation               AM-PAC PT "6 Clicks" Daily Activity  Outcome Measure Difficulty turning over in bed (including adjusting bedclothes, sheets and blankets)?: A Little Difficulty moving from lying on back to sitting on the side of the bed? : A Little Difficulty sitting down on and standing up from a chair with arms (e.g., wheelchair, bedside commode, etc,.)?: A Little Help needed moving to and from a bed to chair (including a wheelchair)?: A Little Help needed walking in hospital room?: A Little Help needed climbing 3-5 steps with a railing? : A Lot 6 Click Score: 17    End of Session Equipment Utilized During Treatment: Gait belt Activity Tolerance: Patient limited by fatigue Patient left: in bed;with family/visitor present Nurse  Communication: Mobility status PT Visit Diagnosis: Unsteadiness on feet (R26.81);Other abnormalities of gait and mobility (R26.89);History of falling (Z91.81);Muscle weakness (generalized) (M62.81);Dizziness and giddiness (R42)    Time: 5284-13241309-1351 PT Time Calculation (min) (ACUTE ONLY): 42 min   Charges:   PT Evaluation $PT Eval Moderate Complexity: 1 Mod PT Treatments $Gait Training: 8-22 mins   PT G Codes:        Zylpha Poynor D. Hartnett-Rands, MS, PT [facility][id number] 07/14/2017, 1:59 PM

## 2017-07-15 ENCOUNTER — Inpatient Hospital Stay (HOSPITAL_COMMUNITY): Payer: Medicare Other

## 2017-07-15 DIAGNOSIS — R7989 Other specified abnormal findings of blood chemistry: Secondary | ICD-10-CM | POA: Diagnosis not present

## 2017-07-15 DIAGNOSIS — R1310 Dysphagia, unspecified: Secondary | ICD-10-CM | POA: Diagnosis not present

## 2017-07-15 DIAGNOSIS — J181 Lobar pneumonia, unspecified organism: Secondary | ICD-10-CM | POA: Diagnosis not present

## 2017-07-15 DIAGNOSIS — R531 Weakness: Secondary | ICD-10-CM | POA: Diagnosis not present

## 2017-07-15 LAB — BASIC METABOLIC PANEL
Anion gap: 9 (ref 5–15)
BUN: 18 mg/dL (ref 6–20)
CALCIUM: 8.1 mg/dL — AB (ref 8.9–10.3)
CO2: 23 mmol/L (ref 22–32)
Chloride: 108 mmol/L (ref 101–111)
Creatinine, Ser: 1.2 mg/dL — ABNORMAL HIGH (ref 0.44–1.00)
GFR calc Af Amer: 48 mL/min — ABNORMAL LOW (ref >60)
GFR, EST NON AFRICAN AMERICAN: 42 mL/min — AB (ref >60)
Glucose, Bld: 99 mg/dL (ref 65–99)
POTASSIUM: 3.4 mmol/L — AB (ref 3.5–5.1)
SODIUM: 140 mmol/L (ref 135–145)

## 2017-07-15 MED ORDER — SODIUM CHLORIDE 0.45 % IV SOLN
INTRAVENOUS | Status: AC
Start: 2017-07-15 — End: 2017-07-16
  Administered 2017-07-15 – 2017-07-16 (×2): via INTRAVENOUS

## 2017-07-15 MED ORDER — CEFPODOXIME PROXETIL 200 MG PO TABS
200.0000 mg | ORAL_TABLET | Freq: Two times a day (BID) | ORAL | Status: DC
  Administered 2017-07-15 – 2017-07-16 (×3): 200 mg via ORAL
  Filled 2017-07-15 (×3): qty 1

## 2017-07-15 MED ORDER — SODIUM CHLORIDE 0.45 % IV BOLUS
500.0000 mL | Freq: Once | INTRAVENOUS | Status: AC
  Administered 2017-07-15: 500 mL via INTRAVENOUS

## 2017-07-15 NOTE — Evaluation (Signed)
Clinical/Bedside Swallow Evaluation Patient Details  Name: Sara [name] MRN: [id number] Date of Birth: 08-17-1940  Today's Date: 07/15/2017 Time: SLP Start Time (ACUTE ONLY): 1719 SLP Stop Time (ACUTE ONLY): 1745 SLP Time Calculation (min) (ACUTE ONLY): 26 min  Past Medical History:  Past Medical History:  Diagnosis Date  . Anxiety   . Arthritis   . Bronchitis    hx of  . Carotid artery occlusion   . Chronic pain syndrome    pain management  . Coronary artery disease    sees Dr. Patty Sermons  . GERD (gastroesophageal reflux disease)    hx of  . H/O hiatal hernia   . Hypercholesteremia   . Hypertension    all managed by Dr. Haynes Dage bill  . Mitral valve regurgitation    trace  . Non-traumatic compression fracture of thoracic vertebra (HCC)    T9  . Stroke [facility])    Past Surgical History:  Past Surgical History:  Procedure Laterality Date  . BACK SURGERY     x 3 Dr Juliene Pina, last @ [facility] ~ 2005, rods, fusion, four surgery  . CARDIAC CATHETERIZATION  1994  . CAROTID ENDARTERECTOMY    . CARPAL TUNNEL RELEASE     left  . CHOLECYSTECTOMY    . ENDARTERECTOMY  04/24/2012   Procedure: ENDARTERECTOMY CAROTID;  Surgeon: Larina Earthly, MD;  Location: [facility] OR;  Service: Vascular;  Laterality: Left;  . HAND SURGERY     bilateral  . HIP FRACTURE SURGERY Left   . HIP PINNING,CANNULATED Left 02/08/2015   Procedure: CANNULATED HIP PINNING;  Surgeon: Kathryne Hitch, MD;  Location: [facility] ORS;  Service: Orthopedics;  Laterality: Left;  . HIP PINNING,CANNULATED Right 03/31/2016   Procedure: CANNULATED HIP PINNING;  Surgeon: Kathryne Hitch, MD;  Location: [facility] ORS;  Service: Orthopedics;  Laterality: Right;  . HYSTEROTOMY    . SPINAL CORD STIMULATOR INSERTION    . TEE WITHOUT CARDIOVERSION  04/12/2012   Procedure: TRANSESOPHAGEAL ECHOCARDIOGRAM (TEE);  Surgeon: Lewayne Bunting, MD;  Location: [facility] ENDOSCOPY;  Service: Cardiovascular;  Laterality: N/A;   HPI:  77 year old female  with a past medical history of chronic back pain with bilateral radiculopathy, hypertension, coronary artery disease, tobacco abuse, GERD presented with complains of shortness of breath and generalized weakness.  H/O CVA, CAD, HTN, Rt hip fx/pinning. Evaluation raised concern for pneumonia- right lobe.  Pt underwent esophagram that showed dysmotility, mild reflux and small hiatal hernia with normal pharyngeal phase of swallow.  MD ordered SLP to rule out oropharyngeal deficits.  Pt admits to some issues swallowing foods after her stroke 7 years ago but admits that resolved.  Now pt states she will eat and sense food lodging in proximal esophagus along with choking sensation then will have to regurgitate at times.  She admits she does not masticate foods well consistently.     Assessment / Plan / Recommendation Clinical Impression  Functional oropharyngeal swallow based on clinical evaluation.  Pt without focal CN deficits and easily passed 3 ounce Yale water test.  She had consumed nearly all of her meal upon SLP entrance to room which included spaghetti.  She denied dysphagia symptoms during meal.  Observed pt consuming honey bun and 3 ounces of water without s/s of oropharyngeal deficits. Given findings of esophageal dysmotility, mild reflux and hiatal hernia- SLP educated pt/daughter to reflux and esophageal precautions using teach back and provided instructions in writing.  Thanks for this consult.  SLP to sign off as all education  completed to help her mitigate her dysphagia symptoms.  SLP Visit Diagnosis: Dysphagia, unspecified (R13.10)    Aspiration Risk  Mild aspiration risk    Diet Recommendation Thin liquid   Liquid Administration via: Cup;Straw Medication Administration: Whole meds with liquid Supervision: Patient able to self feed Compensations: Minimize environmental distractions;Slow rate;Small sips/bites;Other (Comment)(drink liquids t/o meals, consume small frequent meals) Postural  Changes: Remain upright for at least 30 minutes after po intake;Seated upright at 90 degrees    Other  Recommendations Oral Care Recommendations: Oral care BID   Follow up Recommendations None      Frequency and Duration     n/a       Prognosis   n/a    Swallow Study   General Date of Onset: 07/15/17 HPI: 77 year old female with a past medical history of chronic back pain with bilateral radiculopathy, hypertension, coronary artery disease, tobacco abuse, GERD presented with complains of shortness of breath and generalized weakness.  H/O CVA, CAD, HTN, Rt hip fx/pinning. Evaluation raised concern for pneumonia- right lobe.  Pt underwent esophagram that showed dysmotility, mild reflux and small hiatal hernia with normal pharyngeal phase of swallow.  MD ordered SLP to rule out oropharyngeal deficits.  Pt admits to some issues swallowing foods after her stroke 7 years ago but admits that resolved.  Now pt states she will eat and sense food lodging in proximal esophagus along with choking sensation then will have to regurgitate at times.  She admits she does not masticate foods well consistently.   Type of Study: Bedside Swallow Evaluation Diet Prior to this Study: Dysphagia 3 (soft);Thin liquids Temperature Spikes Noted: Yes(99) Respiratory Status: Nasal cannula History of Recent Intubation: No Behavior/Cognition: Alert;Cooperative;Pleasant mood Oral Cavity Assessment: Within Functional Limits Oral Care Completed by SLP: No Oral Cavity - Dentition: Adequate natural dentition;Other (Comment)(partial) Vision: Functional for self-feeding Self-Feeding Abilities: Able to feed self Patient Positioning: Upright in bed Baseline Vocal Quality: Low vocal intensity;Other (comment)(pt reports her voice can become hoarse, admits to nocturnal refluxing) Volitional Cough: Strong Volitional Swallow: Able to elicit    Oral/Motor/Sensory Function Overall Oral Motor/Sensory Function: Within functional  limits   Ice Chips Ice chips: Not tested   Thin Liquid Thin Liquid: Within functional limits Presentation: Cup    Nectar Thick Nectar Thick Liquid: Not tested   Honey Thick Honey Thick Liquid: Not tested   Puree Puree: Not tested   Solid   GO   Solid: Within functional limits Presentation: Self Orvan JulyFed        Kaimana Lurz Ann 07/15/2017,6:17 PM  Donavan Burnetamara Millisa Giarrusso, MS [facility] SLP [phone]

## 2017-07-15 NOTE — Progress Notes (Signed)
Physical Therapy Treatment Patient Details Name: Sara BrickRebecca S [name] MRN: [id number] DOB: 09/05/1940 Today's Date: 07/15/2017    History of Present Illness 77 year old female with a past medical history of chronic back pain with bilateral radiculopathy, hypertension, coronary artery disease presented with complains of shortness of breath and generalized weakness.  H/O CVA, CAD, HTN, Rt hip fx/pinning. Evaluation raised concern for pneumonia.  Patient was hospitalized for further management.    PT Comments    Pt is progressing, incr tolerance to activity/incr gait distance today; will benefit from continued PT ina cute setting   Follow Up Recommendations  Home health PT;Supervision - Intermittent;Supervision for mobility/OOB     Equipment Recommendations  Rolling walker with 5" wheels    Recommendations for Other Services       Precautions / Restrictions Precautions Precautions: Fall    Mobility  Bed Mobility Overal bed mobility: Needs Assistance Bed Mobility: Rolling;Sidelying to Sit;Sit to Supine Rolling: Supervision Sidelying to sit: Min guard   Sit to supine: Supervision   General bed mobility comments: cues to self assist, incr time, min/guard for safety  Transfers Overall transfer level: Needs assistance Equipment used: Rolling walker (2 wheeled) Transfers: Sit to/from Stand Sit to Stand: Min guard         General transfer comment: cues for hand placement, light assist to rise and stabilize  Ambulation/Gait Ambulation/Gait assistance: Min guard;Supervision Ambulation Distance (Feet): 100 Feet Assistive device: Rolling walker (2 wheeled) Gait Pattern/deviations: Step-through pattern;Decreased stride length;Shuffle;Trunk flexed;Narrow base of support     General Gait Details: verbal cues to incr step length, for trunk extension and for Rw position with turns   Careers information officertairs            Wheelchair Mobility    Modified Rankin (Stroke Patients Only)        Balance   Sitting-balance support: Feet supported;Bilateral upper extremity supported;Single extremity supported Sitting balance-Leahy Scale: Fair     Standing balance support: Bilateral upper extremity supported;During functional activity Standing balance-Leahy Scale: Poor(reliant on UEs)                              Cognition Arousal/Alertness: Awake/alert Behavior During Therapy: WFL for tasks assessed/performed Overall Cognitive Status: Within Functional Limits for tasks assessed                                 General Comments: slightly slow to repond at times but appropriate, follows commands      Exercises      General Comments        Pertinent Vitals/Pain Pain Assessment: No/denies pain    Home Living                      Prior Function            PT Goals (current goals can now be found in the care plan section) Acute Rehab PT Goals Patient Stated Goal: return home with HHPT to get stronger. PT Goal Formulation: With patient/family Time For Goal Achievement: 07/21/17 Potential to Achieve Goals: Good Progress towards PT goals: Progressing toward goals    Frequency    Min 3X/week      PT Plan Current plan remains appropriate    Co-evaluation              AM-PAC PT "6 Clicks" Daily Activity  Outcome Measure  Difficulty  turning over in bed (including adjusting bedclothes, sheets and blankets)?: A Little Difficulty moving from lying on back to sitting on the side of the bed? : A Little Difficulty sitting down on and standing up from a chair with arms (e.g., wheelchair, bedside commode, etc,.)?: A Lot Help needed moving to and from a bed to chair (including a wheelchair)?: A Little Help needed walking in hospital room?: A Little Help needed climbing 3-5 steps with a railing? : A Lot 6 Click Score: 16    End of Session Equipment Utilized During Treatment: Gait belt Activity Tolerance: Patient tolerated  treatment well Patient left: in bed;with call bell/phone within reach;with family/visitor present   PT Visit Diagnosis: Unsteadiness on feet (R26.81);History of falling (Z91.81)     Time: 1610-9604 PT Time Calculation (min) (ACUTE ONLY): 16 min  Charges:  $Gait Training: 8-22 mins                    G CodesDrucilla Chalet, PT Pager: [phone] 07/15/2017    Drucilla Chalet 07/15/2017, 2:45 PM

## 2017-07-15 NOTE — Care Management Note (Signed)
Case Management Note  Patient Details  Name: Denton BrickRebecca S Warnick MRN: [id number] Date of Birth: 09/15/1940  Subjective/Objective:       77 yo admitted with CAP. Hx of chronic back pain             Action/Plan: From home with spouse. Pt has wheelchair, walker and bsc at home currently. Pt offered choice for HHPT and [facility] chosen. [facility] rep alerted of referral. Will need MD order for HHPT prior to DC.  Expected Discharge Date:                  Expected Discharge Plan:  Home w Home Health Services  In-House Referral:     Discharge planning Services  CM Consult  Post Acute Care Choice:  Home Health Choice offered to:  Patient  DME Arranged:    DME Agency:     HH Arranged:  PT HH Agency:  [facility] ([facility]  Status of Service:  In process, will continue to follow  If discussed at Long Length of Stay Meetings, dates discussed:    Additional CommentsBartholome Bill:  Estevon Fluke H, RN 07/15/2017, 10:50 AM  [phone]

## 2017-07-15 NOTE — Progress Notes (Signed)
[facility] PROGRESS NOTE  Denton BrickRebecca S Ninneman ZOX:[id number] DOB: 05/18/1940 DOA: 07/12/2017  PCP: Wanda PlumpPaz, Jose E, MD  Brief History/Interval Summary: 77 year old female with a past medical history of chronic back pain, hypertension, coronary artery disease presented with complains of shortness of breath and generalized weakness.  Evaluation raised concern for pneumonia.  Patient was hospitalized for further management.  Reason for Visit: Community-acquired pneumonia  Consultants: None  Procedures: None  Antibiotics: Ceftriaxone and azithromycin [facility]  Subjective/Interval History: Patient feels well this morning.  Denies any shortness of breath.  No cough.  Unable to tell me if she is feeling any better.  No difficulty with swallowing yesterday although she has poor appetite.     ROS: Denies any chest pain  Objective:  Vital Signs  Vitals:   07/14/17 1010 07/14/17 1440 07/14/17 2043 07/15/17 0429  BP: (!) 152/68 (!) 132/49 135/62 130/65  Pulse: 79 62 60 80  Resp:  17 14 14   Temp:  98.7 F (37.1 C) 98.7 F (37.1 C) 98.8 F (37.1 C)  TempSrc:  Oral Oral Oral  SpO2:  96% 93% 98%  Weight:      Height:        Intake/Output Summary (Last 24 hours) at 07/15/2017 0929 Last data filed at 07/15/2017 0600 Gross per 24 hour  Intake 1443.33 ml  Output 550 ml  Net 893.33 ml   Filed Weights   07/13/17 0643  Weight: 66.2 kg (145 lb 15.1 oz)    General appearance: Awake alert.  In no distress. Resp: Improving air entry bilaterally.  Few crackles in the right.  No wheezing or rhonchi. Cardio: S1-S2 is normal regular.  No S3-S4.  No rubs murmurs of bruit GI: Abdomen is soft.  Nontender nondistended.  Bowel sounds are present.  No masses organomegaly Extremities: No edema Neurologic: Alert.  Mildly distracted.  No obvious focal neurological deficits.  Lab Results:  Data Reviewed: I have personally reviewed following labs and imaging  studies  CBC: Recent Labs  Lab 07/12/17 2236 07/13/17 0126 07/14/17 0423  WBC 17.4*  --  7.8  NEUTROABS 13.1*  --   --   HGB 13.7 11.6* 11.8*  HCT 41.2 34.0* 37.3  MCV 92.6  --  93.3  PLT 256  --  229    Basic Metabolic Panel: Recent Labs  Lab 07/13/17 0126 07/13/17 0543 07/14/17 0423 07/15/17 0412  NA 140 139 141 140  K 3.8 3.6 3.5 3.4*  CL 109 107 106 108  CO2  --  24 25 23   GLUCOSE 97 102* 111* 99  BUN 21* 20 20 18   CREATININE 1.00 0.93 1.17* 1.20*  CALCIUM  --  8.1* 8.2* 8.1*    GFR: Estimated Creatinine Clearance: 34.2 mL/min (A) (by C-G formula based on SCr of 1.2 mg/dL (H)).  Liver Function Tests: Recent Labs  Lab 07/13/17 0543  AST 23  ALT 19  ALKPHOS 69  BILITOT 1.0  PROT 6.1*  ALBUMIN 3.1*     Radiology Studies: Dg Esophagus  Result Date: 07/15/2017 CLINICAL DATA:  Solid food dysphagia for the past 6 months. Patient feels like food gets stuck in her proximal esophagus. EXAM: ESOPHOGRAM / BARIUM SWALLOW / BARIUM TABLET STUDY TECHNIQUE: Combined double contrast and single contrast examination performed using effervescent crystals, thick barium liquid, and thin barium liquid. The patient was observed with fluoroscopy swallowing a 13 mm barium sulphate tablet. FLUOROSCOPY TIME:  Fluoroscopy Time:  2 minutes, 30 seconds. Radiation Exposure  Index (if provided by the fluoroscopic device): 20.6 mGy. Number of Acquired Spot Images: 0 COMPARISON:  CT chest dated July 13, 2017. FINDINGS: The pharyngeal phase of swallowing was normal. Mild tertiary contractions of the distal esophagus. No obstruction to the forward flow of contrast throughout the esophagus and into the stomach. Normal esophageal course and contour. Normal esophageal mucosal pattern. No esophageal stricture, ulceration, or other significant abnormality. Small hiatal hernia. Mild gastroesophageal reflux into the distal esophagus occurred spontaneously. Slightly delayed passage of a 13 mm barium tablet  passed into the stomach. IMPRESSION: 1. 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 2. Small hiatal hernia.  Mild gastroesophageal reflux. Electronically Signed   By: Obie Dredge M.D.   On: 07/15/2017 09:11   Dg Chest Port 1 View  Result Date: 07/14/2017 CLINICAL DATA:  Shortness of breath.  Leg pain. EXAM: PORTABLE CHEST 1 VIEW COMPARISON:  Chest CT obtained yesterday and portable chest radiograph dated 07/13/2015. FINDINGS: The cardiac silhouette remains mildly enlarged. The aorta remains tortuous and calcified. Patchy opacity is again demonstrated in the right upper lobe, with improvement. The left lung is clear. No pleural fluid seen. Thoracic spine neural stimulator leads are unchanged. Diffuse osteopenia. IMPRESSION: Improving right upper lobe pneumonia. Electronically Signed   By: Beckie Salts M.D.   On: 07/14/2017 10:32     Medications:  Scheduled: . amLODipine  10 mg Oral Daily  . aspirin EC  81 mg Oral Daily  . atorvastatin  20 mg Oral q1800  . azithromycin  500 mg Oral QHS  . docusate sodium  100 mg Oral BID  . donepezil  5 mg Oral QHS  . enoxaparin (LOVENOX) injection  40 mg Subcutaneous Q24H  . escitalopram  10 mg Oral Daily  . gabapentin  600 mg Oral 4 times per day  . gabapentin  900 mg Oral QHS  . HYDROcodone Bitartrate ER  40 mg Per post-pyloric tube QHS  . metoprolol tartrate  25 mg Oral BID  . pantoprazole  40 mg Oral Daily  . sertraline  25 mg Oral Daily  . vitamin B-12  1,000 mcg Oral Daily   Continuous: . sodium chloride 100 mL/hr at 07/15/17 0812  . cefTRIAXone (ROCEPHIN)  IV Stopped (07/14/17 2317)  . sodium chloride     WUJ:WJXBJYNWG, diazepam, diphenhydrAMINE, HYDROcodone-acetaminophen, polyethylene glycol, sodium phosphate  Assessment/Plan:  Principal Problem:   Community acquired pneumonia of right upper lobe of lung (HCC) Active Problems:   Hypertension   Chronic back pain   Generalized weakness    Community-acquired  pneumonia No clear evidence for sepsis.  CT scan showed changes in the right upper lobe.  Influenza PCR negative.  Cultures negative so far.  Lactic acid level was initially mildly elevated and then normal.  Chest x-ray was repeated on 3/21 and shows improvement in the right upper lobe consolidation.  Patient is stable.  Saturating normal on room air.  Change to oral antibiotics today. Patient will need repeat imaging studies in 4-6 weeks to ensure resolution of these findings.  Seen by physical therapy.  Will need home health.  Dysphagia She mentioned some difficulty swallowing more with solid foods than liquids.  Barium swallow was done which shows mild esophageal dysmotility.  No obvious obstruction noted.  Discussed in detail with patient and her daughter.  Would not pursue endoscopy at this time considering her pneumonia.  Speech therapy will be consulted to make sure there is no oropharyngeal component.  She will need to follow-up  with her PCP for further evaluation.    History of essential hypertension Stable.  Continue home medications.  Mildly elevated creatinine Creatinine remains mildly elevated.  Could be due to poor oral intake.  She will be given extra IV fluids today.  Recheck labs tomorrow.  Chronic back pain Continue Neurontin.  Does not appear to be experiencing any side effects of same.  Patient also on narcotics at home which have been continued.  Generalized weakness  Most likely secondary to infectious process.  Home health will be ordered.  DVT Prophylaxis: Lovenox    Code Status: Full code Family Communication: Discussed with the patient and her daughters. Disposition Plan: Management as outlined above.  Change to oral antibiotics.  Mobilize further today.  Anticipate discharge tomorrow.    LOS: 2 days   Osvaldo Shipper  [facility] Pager [phone] 07/15/2017, 9:29 AM  If 7PM-7AM, please contact night-coverage at [email or web address], password [facility]

## 2017-07-16 DIAGNOSIS — J181 Lobar pneumonia, unspecified organism: Secondary | ICD-10-CM | POA: Diagnosis not present

## 2017-07-16 DIAGNOSIS — R531 Weakness: Secondary | ICD-10-CM | POA: Diagnosis not present

## 2017-07-16 LAB — BASIC METABOLIC PANEL
Anion gap: 9 (ref 5–15)
BUN: 17 mg/dL (ref 6–20)
CO2: 23 mmol/L (ref 22–32)
CREATININE: 1 mg/dL (ref 0.44–1.00)
Calcium: 8 mg/dL — ABNORMAL LOW (ref 8.9–10.3)
Chloride: 110 mmol/L (ref 101–111)
GFR calc Af Amer: >60 mL/min (ref >60)
GFR, EST NON AFRICAN AMERICAN: 52 mL/min — AB (ref >60)
GLUCOSE: 93 mg/dL (ref 65–99)
POTASSIUM: 3.6 mmol/L (ref 3.5–5.1)
Sodium: 142 mmol/L (ref 135–145)

## 2017-07-16 MED ORDER — CEFPODOXIME PROXETIL 200 MG PO TABS: 200.0000 mg | ORAL_TABLET | Freq: Two times a day (BID) | ORAL | 0 refills | Status: AC

## 2017-07-16 MED ORDER — AZITHROMYCIN 500 MG PO TABS: 500.0000 mg | ORAL_TABLET | Freq: Every day | ORAL | 0 refills | Status: AC

## 2017-07-16 NOTE — Discharge Instructions (Signed)
Dysphagia Diet Level 3, Mechanically Advanced °The dysphagia level 3 diet includes foods that are soft, moist, and can be chopped into 1-inch chunks. This diet is helpful for people with mild swallowing difficulties. It reduces the risk of food getting caught in the windpipe, trachea, or lungs. °What do I need to know about this diet? °· You may eat foods that are soft and moist. °· If you were on the dysphagia level 1 or level 2 diets, you may eat any of the foods included on those lists. °· Avoid foods that are dry, hard, sticky, chewy, coarse, and crunchy. Also avoid large cuts of food. °· Take small bites. Each bite should contain 1 inch or less of food. °· Thicken liquids if instructed by your health care provider. Follow your health care provider's instructions on how to do this and to what consistency. °· See your dietitian or speech language pathologist regularly for help with your dietary changes. °What foods can I eat? °Grains °Moist breads without nuts or seeds. Biscuits, muffins, pancakes, and waffles well-moistened with syrup, jelly, margarine, or butter. Smooth cereals with plenty of milk to moisten them. Moist bread stuffing. Moist rice. °Vegetables °All cooked, soft vegetables. Shredded lettuce. Tender fried potatoes. °Fruits °All canned and cooked fruits. Soft, peeled fresh fruits, such as peaches, nectarines, kiwis, cantaloupe, honeydew melon, and watermelon without seeds. Soft berries, such as strawberries. °Meat and Other Protein Sources °Moist ground or finely diced or sliced meats. Solid, tender cuts of meat. Meatloaf. Hamburger with a bun. Sausage patty. Deli thin-sliced lunch meat. Chicken, egg, or tuna salad sandwich. Sloppy joe. Moist fish. Eggs prepared any way. Casseroles with small chunks of meats, ground meats, or tender meats. °Dairy °Cheese spreads without coarse large chunks. Shredded cheese. Cheese slices. Cottage cheese. Milk at the right texture. Smooth frappes. Yogurt without  nuts or coconut. Ask your health care provider whether you can have frozen desserts (such as malts or milk shakes) and thin liquids. °Sweets/Desserts °Soft, smooth, moist desserts. Non-chewy, smooth candy. Jam. Jelly. Honey. Preserves. Ask your health care provider whether you can have frozen desserts. °Fats and Oils °Butter. Oils. Margarine. Mayonnaise. Gravy. Spreads. °Other °All seasonings and sweeteners. All sauces without large chunks. °The items listed above may not be a complete list of recommended foods or beverages. Contact your dietitian for more options. °What foods are not recommended? °Grains °Coarse or dry cereals. Dry breads. Toast. Crackers. Tough, crusty breads, such French bread and baguettes. Tough, crisp fried potatoes. Potato skins. Dry bread stuffing. Granola. Popcorn. Chips. °Vegetables °All raw vegetables except shredded lettuce. Cooked corn. Rubbery or stiff cooked vegetables. Stringy vegetables, such as celery. °Fruits °Hard fruits that are difficult to chew, such as apples or pears. Stringy, high-pulp fruits, such as pineapple, papaya, or mango. Fruits with tough skins, such as grapes. Coconut. All dried fruits. Fruit leather. Fruit roll-ups. Fruit snacks. °Meat and Other Protein Sources °Dry or tough meats or poultry. Dry fish. Fish with bones. Peanut butter. All nuts and seeds. °Dairy °Any with nuts, seeds, chocolate chips, dried fruit, coconut, or pineapple. °Sweets/Desserts °Dry cakes. Chewy or dry cookies. Any with nuts, seeds, dry fruits, coconut, pineapple, or anything dry, sticky, or hard. Chewy caramel. Licorice. Taffy-type candies. Ask your health care provider whether you can have frozen desserts. °Fats and Oils °Any with chunks, nuts, seeds, or pineapple. Olives. Pickles. °Other °Soups with tough or large chunks of meats, poultry, or vegetables. Corn or clam chowder. °The items listed above may not be a   complete list of foods and beverages to avoid. Contact your dietitian for  more information. This information is not intended to replace advice given to you by your health care provider. Make sure you discuss any questions you have with your health care provider. Document Released: 04/12/2005 Document Revised: 09/18/2015 Document Reviewed: 03/26/2013 Elsevier Interactive Patient Education  2018 [employer]  Community-Acquired Pneumonia, Adult Pneumonia is an infection of the lungs. One type of pneumonia can happen while a person is in a hospital. A different type can happen when a person is not in a hospital (community-acquired pneumonia). It is easy for this kind to spread from person to person. It can spread to you if you breathe near an infected person who coughs or sneezes. Some symptoms include:  A dry cough.  A wet (productive) cough.  Fever.  Sweating.  Chest pain.  Follow these instructions at home:  Take over-the-counter and prescription medicines only as told by your doctor. ? Only take cough medicine if you are losing sleep. ? If you were prescribed an antibiotic medicine, take it as told by your doctor. Do not stop taking the antibiotic even if you start to feel better.  Sleep with your head and neck raised (elevated). You can do this by putting a few pillows under your head, or you can sleep in a recliner.  Do not use tobacco products. These include cigarettes, chewing tobacco, and e-cigarettes. If you need help quitting, ask your doctor.  Drink enough water to keep your pee (urine) clear or pale yellow. A shot (vaccine) can help prevent pneumonia. Shots are often suggested for:  People older than 79 years of age.  People older than 79 years of age: ? Who are having cancer treatment. ? Who have long-term (chronic) lung disease. ? Who have problems with their body's defense system (immune system).  You may also prevent pneumonia if you take these actions:  Get the flu (influenza) shot every year.  Go to the dentist as often as  told.  Wash your hands often. If soap and water are not available, use hand sanitizer.  Contact a doctor if:  You have a fever.  You lose sleep because your cough medicine does not help. Get help right away if:  You are short of breath and it gets worse.  You have more chest pain.  Your sickness gets worse. This is very serious if: ? You are an older adult. ? Your body's defense system is weak.  You cough up blood. This information is not intended to replace advice given to you by your health care provider. Make sure you discuss any questions you have with your health care provider. Document Released: 09/29/2007 Document Revised: 09/18/2015 Document Reviewed: 08/07/2014 Elsevier Interactive Patient Education  [employer]2018 [employer]vier Inc.

## 2017-07-16 NOTE — Progress Notes (Signed)
Discharge Planning:Contacted [facility] to make aware of dc home today. NCM spoke to pt, dtr and husband at bedside. Pt has RW and RW with seat at home. Please see previous NCM notes. Isidoro DonningAlesia Kayven Aldaco RN CCM Case Mgmt phone [phone]

## 2017-07-16 NOTE — Care Management Important Message (Signed)
Important Message  Patient Details  Name: Sara BrickRebecca S Henzler MRN: [id number] Date of Birth: 04/06/1941   Medicare Important Message Given:  Yes    Elliot CousinShavis, Mattthew Ziomek Ellen, RN 07/16/2017, 11:25 AM

## 2017-07-16 NOTE — Progress Notes (Signed)
Patient discharged to home with family, discharge instructions reviewed with patient & family who verbalized understanding. New RX's given to patient.

## 2017-07-16 NOTE — Discharge Summary (Signed)
[facility]  Physician Discharge Summary   Patient ID: Sara BrickRebecca S Games MRN: [id number] DOB/AGE: 79/07/1940 79 y.o.  Admit date: 07/12/2017 Discharge date: 07/16/2017  PCP: Wanda PlumpPaz, Jose E, MD  DISCHARGE DIAGNOSES:  Principal Problem:   Community acquired pneumonia of right upper lobe of lung (HCC) Active Problems:   Hypertension   Chronic back pain   Generalized weakness   RECOMMENDATIONS FOR OUTPATIENT FOLLOW UP: 1. Patient instructed to follow-up with her PCP within 1 week 2. She will need repeat chest x-ray in 4-6 weeks 3. Consider referral to gastroenterology if her dysphagia persists   DISCHARGE CONDITION: fair  Diet recommendation: Dysphagia 3 diet  Filed Weights   07/13/17 0643  Weight: 66.2 kg (145 lb 15.1 oz)    INITIAL HISTORY: 77 year old female with a past medical history of chronic back pain, hypertension, coronary artery disease presented with complains of shortness of breath and generalized weakness.  Evaluation raised concern for pneumonia.  Patient was hospitalized for further management.   HOSPITAL COURSE:   Community-acquired pneumonia There was no clear evidence for sepsis at the time of admission.  CT scan showed changes in the right upper lobe. Influenza PCR negative.  Cultures remain negative.  Lactic acid level was initially mildly elevated and then normal.  Chest x-ray was repeated on 3/21 and shows improvement in the right upper lobe consolidation.    Patient was initially placed on ceftriaxone and azithromycin.  She was changed over to oral antibiotics and will be continued on the same for a few more days.  She is saturating normal on room air now.. Patient will need repeat chest x-ray in 4-6 weeks to ensure resolution of these findings.  Seen by physical therapy.  Will need home health which has been ordered.  Dysphagia likely due to esophageal dysmotility She mentioned some difficulty swallowing more with solid foods than liquids.  Barium  swallow was done which shows mild esophageal dysmotility.  No obvious obstruction noted.  Discussed in detail with patient and her daughter.  Would not pursue endoscopy at this time considering her pneumonia.    Seen by speech therapy as well.  Follow-up with PCP for further evaluation.    History of essential hypertension Stable.  Continue home medications.  Mildly elevated creatinine Improved with IV hydration.  Significance remains unclear.  Chronic back pain She may continue with her home medications.  Generalized weakness  Most likely secondary to infectious process.    Home health has been ordered.  Overall stable.  Feels much better.  Okay for discharge home.  Discussed with patient and her daughter.   PERTINENT LABS:  The results of significant diagnostics from this hospitalization (including imaging, microbiology, ancillary and laboratory) are listed below for reference.    Microbiology: Recent Results (from the past 240 hour(s))  Blood Culture (routine x 2)     Status: None (Preliminary result)   Collection Time: 07/12/17 10:36 PM  Result Value Ref Range Status   Specimen Description   Final    BLOOD RIGHT ANTECUBITAL Performed at [facility], [address] [place], [place] [address]    Special Requests   Final    BOTTLES DRAWN AEROBIC AND ANAEROBIC Blood Culture adequate volume Performed at [facility], [address] [place], [place] [address]    Culture   Final    NO GROWTH 3 DAYS Performed at [facility], [address] [place], [place] [address]    Report Status PENDING  Incomplete  Urine culture     Status: None   Collection Time: 07/12/17 11:58 PM  Result Value Ref Range Status   Specimen Description   Final    URINE, CATHETERIZED Performed at [facility], [address], [place], [place] [address]    Special Requests   Final    NONE Performed at [facility],  [address] [place], [place] [address]    Culture   Final    NO GROWTH Performed at [facility], [address], [place], [place] [address]    Report Status 07/14/2017 FINAL  Final  Blood Culture (routine x 2)     Status: None (Preliminary result)   Collection Time: 07/13/17  5:42 AM  Result Value Ref Range Status   Specimen Description   Final    BLOOD RIGHT ANTECUBITAL Performed at [facility], [address], [place], [place] [address]    Special Requests   Final    BOTTLES DRAWN AEROBIC ONLY Blood Culture adequate volume Performed at [facility], [address], [place], [place] [address]    Culture   Final    NO GROWTH 3 DAYS Performed at [facility], [address] [place], [place] [address]    Report Status PENDING  Incomplete     Labs: Basic Metabolic Panel: Recent Labs  Lab 07/13/17 0126 07/13/17 0543 07/14/17 0423 07/15/17 0412 07/16/17 0336  NA 140 139 141 140 142  K 3.8 3.6 3.5 3.4* 3.6  CL 109 107 106 108 110  CO2  --  24 25 23 23   GLUCOSE 97 102* 111* 99 93  BUN 21* 20 20 18 17   CREATININE 1.00 0.93 1.17* 1.20* 1.00  CALCIUM  --  8.1* 8.2* 8.1* 8.0*   Liver Function Tests: Recent Labs  Lab 07/13/17 0543  AST 23  ALT 19  ALKPHOS 69  BILITOT 1.0  PROT 6.1*  ALBUMIN 3.1*   CBC: Recent Labs  Lab 07/12/17 2236 07/13/17 0126 07/14/17 0423  WBC 17.4*  --  7.8  NEUTROABS 13.1*  --   --   HGB 13.7 11.6* 11.8*  HCT 41.2 34.0* 37.3  MCV 92.6  --  93.3  PLT 256  --  229    IMAGING STUDIES Ct Chest W Contrast  Result Date: 07/13/2017 CLINICAL DATA:  Fever. Generalized weakness. Abnormal chest radiograph. EXAM: CT CHEST WITH CONTRAST TECHNIQUE: Multidetector CT imaging of the chest was performed during intravenous contrast administration. CONTRAST:  75mL ISOVUE-300 IOPAMIDOL (ISOVUE-300) INJECTION 61% COMPARISON:  Chest radiograph earlier this day.  Chest CT 09/01/2015 FINDINGS:  Cardiovascular: Tortuous thoracic aorta with diffuse calcified and noncalcified irregular atheromatous plaque. Greatest aortic dimension about the proximal descending measuring 3.7 cm, unchanged. Atherosclerotic disease at the origin of the great vessels again seen. No aortic dissection or evidence of acute aortic abnormality. Mild cardiomegaly dense coronary artery calcifications. Dilatation of the main pulmonary artery measuring 3.4 cm. No central pulmonary embolus. No pericardial effusion. Mediastinum/Nodes: Enlarged lower paratracheal node measuring 16 mm short axis. Small but not enlarged right hilar nodes, largest in the infrahilar region measuring 7 mm short axis. No left hilar adenopathy. The esophagus is decompressed. No thyroid nodule. Lungs/Pleura: Right upper lobe airspace disease with patchy, ground-glass, and nodular/consolidative components. Findings are most prominent in the perihilar/basilar portion of the upper lobe, however there is no central obstructing lesion. Right upper lobe bronchial thickening. No significant debris in the trachea or  mainstem bronchi. Biapical pleuroparenchymal scarring, partially calcified. Minimal subpleural reticulation in the right lower lobe and left upper lobe. No pulmonary nodules demonstrated elsewhere. No pleural fluid. Upper Abdomen: Abdominal aortic aneurysm only partially included, unchanged were visualized. Again seen right renal cyst and cortical scarring of the upper left kidney. No acute finding. Musculoskeletal: T8 compression fracture with vertebral augmentation. Spinal stimulator with tips posterior to T7. No acute osseous abnormalities. No evidence of focal osseous lesion. IMPRESSION: 1. Right upper lobe airspace disease with patchy, ground-glass, nodular/consolidative components. Differential consideration of infection versus neoplasm. Given fever, recommend short interval follow-up after a trial of antibiotic therapy to evaluate for resolution. No  central obstructing lesion. 2. Enlarged lower paratracheal lymph node is nonspecific. 3. Advanced irregular aortic atherosclerosis, similar to 09/01/2015 CTA. Coronary artery calcifications. 4. Dilatation of the main pulmonary artery suggesting pulmonary arterial hypertension. Aortic Atherosclerosis (ICD10-I70.0). Electronically Signed   By: Rubye Oaks M.D.   On: 07/13/2017 03:33   Dg Esophagus  Result Date: 07/15/2017 CLINICAL DATA:  Solid food dysphagia for the past 6 months. Patient feels like food gets stuck in her proximal esophagus. EXAM: ESOPHOGRAM / BARIUM SWALLOW / BARIUM TABLET STUDY TECHNIQUE: Combined double contrast and single contrast examination performed using effervescent crystals, thick barium liquid, and thin barium liquid. The patient was observed with fluoroscopy swallowing a 13 mm barium sulphate tablet. FLUOROSCOPY TIME:  Fluoroscopy Time:  2 minutes, 30 seconds. Radiation Exposure Index (if provided by the fluoroscopic device): 20.6 mGy. Number of Acquired Spot Images: 0 COMPARISON:  CT chest dated July 13, 2017. FINDINGS: The pharyngeal phase of swallowing was normal. Mild tertiary contractions of the distal esophagus. No obstruction to the forward flow of contrast throughout the esophagus and into the stomach. Normal esophageal course and contour. Normal esophageal mucosal pattern. No esophageal stricture, ulceration, or other significant abnormality. Small hiatal hernia. Mild gastroesophageal reflux into the distal esophagus occurred spontaneously. Slightly delayed passage of a 13 mm barium tablet passed into the stomach. IMPRESSION: 1. 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 2. Small hiatal hernia.  Mild gastroesophageal reflux. Electronically Signed   By: Obie Dredge M.D.   On: 07/15/2017 09:11   Dg Chest Port 1 View  Result Date: 07/14/2017 CLINICAL DATA:  Shortness of breath.  Leg pain. EXAM: PORTABLE CHEST 1 VIEW COMPARISON:  Chest CT  obtained yesterday and portable chest radiograph dated 07/13/2015. FINDINGS: The cardiac silhouette remains mildly enlarged. The aorta remains tortuous and calcified. Patchy opacity is again demonstrated in the right upper lobe, with improvement. The left lung is clear. No pleural fluid seen. Thoracic spine neural stimulator leads are unchanged. Diffuse osteopenia. IMPRESSION: Improving right upper lobe pneumonia. Electronically Signed   By: Beckie Salts M.D.   On: 07/14/2017 10:32   Dg Chest Port 1 View  Result Date: 07/12/2017 CLINICAL DATA:  Congestion and fever.  Weakness and fatigue. EXAM: PORTABLE CHEST 1 VIEW COMPARISON:  03/31/2016 FINDINGS: Mild patient rotation. Mild cardiomegaly with tortuous thoracic aorta, atherosclerosis of the arch. There is volume loss in the right lung with patchy right suprahilar airspace disease. Minimal left lung base atelectasis. No large pleural effusion. No pneumothorax. Vertebral augmentation in the midthoracic spine. IMPRESSION: 1. Right lung volume loss. Patchy opacity in the right upper lobe. Findings can be seen with central obstructing lesion. Recommend chest CT, preferably with contrast. Alternatively, upper lobe opacities may be secondary to pneumonia. 2. Cardiomegaly with tortuous atherosclerotic thoracic aorta. Electronically Signed   By: Rubye Oaks  M.D.   On: 07/12/2017 23:29    DISCHARGE EXAMINATION: Vitals:   07/15/17 1000 07/15/17 1815 07/15/17 1938 07/16/17 0402  BP: (!) 155/66 (!) 130/51 (!) 139/58 126/74  Pulse: 86 61 62 62  Resp:  16 14 13   Temp:  97.6 F (36.4 C) 97.9 F (36.6 C) 98.2 F (36.8 C)  TempSrc:  Oral Oral Oral  SpO2:  94% 99% 98%  Weight:      Height:       General appearance: alert, cooperative, appears stated age and no distress Resp: clear to auscultation bilaterally Cardio: regular rate and rhythm, S1, S2 normal, no murmur, click, rub or gallop GI: soft, non-tender; bowel sounds normal; no masses,  no  organomegaly Extremities: extremities normal, atraumatic, no cyanosis or edema  DISPOSITION: Home  Discharge Instructions    Call MD for:  extreme fatigue   Complete by:  As directed    Call MD for:  persistant dizziness or light-headedness   Complete by:  As directed    Call MD for:  persistant nausea and vomiting   Complete by:  As directed    Call MD for:  severe uncontrolled pain   Complete by:  As directed    Call MD for:  temperature >100.4   Complete by:  As directed    Discharge instructions   Complete by:  As directed    Please be sure to follow-up with your PCP in 1 week.  You will need to have chest x-ray done in 4-6 weeks.  Stay well-hydrated.  Eat your meals slowly and chew them well before swallowing.  If your swallowing difficulty persists please talk to your doctor about referring you to a gastroenterologist.  You were cared for by a hospitalist during your hospital stay. If you have any questions about your discharge medications or the care you received while you were in the hospital after you are discharged, you can call the unit and asked to speak with the hospitalist on call if the hospitalist that took care of you is not available. Once you are discharged, your primary care physician will handle any further medical issues. Please note that NO REFILLS for any discharge medications will be authorized once you are discharged, as it is imperative that you return to your primary care physician (or establish a relationship with a primary care physician if you do not have one) for your aftercare needs so that they can reassess your need for medications and monitor your lab values. If you do not have a primary care physician, you can call [phone] for a physician referral.   Increase activity slowly   Complete by:  As directed         Allergies as of 07/16/2017      Reactions   Morphine And Related Rash   Percocet [oxycodone-acetaminophen] Rash   Tolerates APAP (including  IV), morphine, and Hydrocodone/APAP   Hydrochlorothiazide    Pt does not remember   Indomethacin    Pt does not remember   Paroxetine Hcl    Pt does not remember   Penicillins    Has patient had a PCN reaction causing immediate rash, facial/tongue/throat swelling, SOB or lightheadedness with hypotension: Yes Has patient had a PCN reaction causing severe rash involving mucus membranes or skin necrosis: No Has patient had a PCN reaction that required hospitalization No Has patient had a PCN reaction occurring within the last 10 years: No If all of the above answers are "NO", then may  proceed with Cephalosporin use.   Pravachol    Pt does not remember   Quinine Derivatives    Pt does not remember   Wellbutrin [bupropion]    agitation   Zocor [simvastatin - High Dose] Other (See Comments)   Weakness/no energy      Medication List    TAKE these medications   amLODipine 10 MG tablet Commonly known as:  NORVASC Take 1 tablet (10 mg total) by mouth daily.   aspirin EC 81 MG tablet Take 81 mg by mouth daily.   atorvastatin 40 MG tablet Commonly known as:  LIPITOR Take 0.5 tablets (20 mg total) by mouth daily.   azithromycin 500 MG tablet Commonly known as:  ZITHROMAX Take 1 tablet (500 mg total) by mouth daily for 4 days.   cefpodoxime 200 MG tablet Commonly known as:  VANTIN Take 1 tablet (200 mg total) by mouth every 12 (twelve) hours for 5 days.   diazepam 5 MG tablet Commonly known as:  VALIUM Take 1 tablet (5 mg total) by mouth daily as needed. What changed:  reasons to take this   donepezil 5 MG tablet Commonly known as:  ARICEPT Take 1 tablet (5 mg total) by mouth at bedtime.   escitalopram 10 MG tablet Commonly known as:  LEXAPRO Take 10 mg by mouth daily.   gabapentin 300 MG capsule Commonly known as:  NEURONTIN Take 900 mg by mouth at bedtime.   gabapentin 600 MG tablet Commonly known as:  NEURONTIN Take 1 tablet (600 mg total) by mouth 4 (four) times  daily.   HYDROcodone-acetaminophen 10-325 MG tablet Commonly known as:  NORCO Take 1 tablet by mouth every 4 (four) hours as needed for moderate pain (for pain).   HYSINGLA ER 40 MG T24a Generic drug:  HYDROcodone Bitartrate ER TK 1 T PO QD   metoprolol tartrate 50 MG tablet Commonly known as:  LOPRESSOR Take 0.5 tablets (25 mg total) by mouth 2 (two) times daily.   omeprazole 20 MG capsule Commonly known as:  PRILOSEC Take 1 capsule (20 mg total) by mouth daily.   sertraline 25 MG tablet Commonly known as:  ZOLOFT Take 25 mg by mouth daily.   vitamin B-12 1000 MCG tablet Commonly known as:  CYANOCOBALAMIN Take 1,000 mcg by mouth daily.        Follow-up Information    Wanda Plump, MD. Schedule an appointment as soon as possible for a visit in 1 week(s).   Specialty:  Internal Medicine Why:  post hospitalization follow up in 1 week. Needs CXR in 4-6 weks. Contact information: [address] [place] [address] [phone]        [facility] Follow up.   Specialty:  Home Health Services Why:  Home Health Physical Therapy- agency will call to arrange initial visit Contact information: [address] [place] [address] [place] [address] [place] [address] [phone]           TOTAL DISCHARGE TIME: 35 minutes  Osvaldo Shipper  [facility] Pager [phone]  07/16/2017, 4:07 PM

## 2017-07-16 NOTE — Plan of Care (Signed)
  Problem: Pain Managment: Goal: General experience of comfort will improve Outcome: Progressing   

## 2017-07-18 ENCOUNTER — Encounter: Payer: Self-pay | Admitting: *Deleted

## 2017-07-18 ENCOUNTER — Telehealth: Payer: Self-pay | Admitting: *Deleted

## 2017-07-18 LAB — CULTURE, BLOOD (ROUTINE X 2)
Culture: NO GROWTH
Culture: NO GROWTH
Special Requests: ADEQUATE
Special Requests: ADEQUATE

## 2017-07-18 NOTE — Telephone Encounter (Signed)
thx

## 2017-07-18 NOTE — Telephone Encounter (Signed)
This encounter was created in error - please disregard.

## 2017-07-18 NOTE — Telephone Encounter (Signed)
Patient ID: Sara BrickRebecca S [name] MRN: [id number] DOB/AGE: 79/07/1940 79 y.o.  Admit date: 07/12/2017 Discharge date: 07/16/2017  PCP: Sara PlumpPaz, Jose E, MD  DISCHARGE DIAGNOSES:  Principal Problem:   Community acquired pneumonia of right upper lobe of lung (HCC) Active Problems:   Hypertension   Chronic back pain   Generalized weakness   RECOMMENDATIONS FOR OUTPATIENT FOLLOW UP: 1. Patient instructed to follow-up with her PCP within 1 week 2. She will need repeat chest x-ray in 4-6 weeks 3. Consider referral to gastroenterology if her dysphagia persists   DISCHARGE CONDITION: fair  Diet recommendation: Dysphagia 3 diet   Transition Care Management Follow-up Telephone Call   How have you been since you were released from the hospital? "Doing  Alright"   Do you understand why you were in the hospital? yes   Do you understand the discharge instructions? yes   Where were you discharged to? Home with husband    Items Reviewed:  Medications reviewed: no. Pt does not have list on hand.  Allergies reviewed: yes  Dietary changes reviewed: yes  Referrals reviewed: yes   Functional Questionnaire:   Activities of Daily Living (ADLs):   She states they are independent in the following: ambulation, bathing and hygiene, feeding, continence, grooming, toileting and dressing States they require assistance with the following: NA   Any transportation issues/concerns?: no   Any patient concerns? no   Confirmed importance and date/time of follow-up visits scheduled yes  Provider Appointment booked with Dr.Paz 08/12/17  Confirmed with patient if condition begins to worsen call PCP or go to the ER.  Patient was given the office number and encouraged to call back with question or concerns.  : yes

## 2017-07-19 ENCOUNTER — Telehealth: Payer: Self-pay | Admitting: Internal Medicine

## 2017-07-19 ENCOUNTER — Encounter: Payer: Medicare Other | Admitting: Vascular Surgery

## 2017-07-19 NOTE — Telephone Encounter (Signed)
Spoke w/ Shon HaleFlor, verbal orders given.

## 2017-07-19 NOTE — Telephone Encounter (Signed)
Copied from CRM [id number]. Topic: Quick Communication - See Telephone Encounter >> Jul 19, 2017  1:30 PM Clack, Princella PellegriniJessica D wrote: CRM for notification. See Telephone encounter for: 07/19/17.  [facility] PT 2 week 5.  [phone]

## 2017-07-25 ENCOUNTER — Encounter: Payer: Self-pay | Admitting: Internal Medicine

## 2017-07-25 ENCOUNTER — Ambulatory Visit (INDEPENDENT_AMBULATORY_CARE_PROVIDER_SITE_OTHER): Payer: Medicare Other | Admitting: Internal Medicine

## 2017-07-25 VITALS — BP 116/76 | HR 56 | Temp 98.1°F | Resp 16 | Ht 64.0 in | Wt 140.0 lb

## 2017-07-25 DIAGNOSIS — J189 Pneumonia, unspecified organism: Secondary | ICD-10-CM

## 2017-07-25 DIAGNOSIS — R1310 Dysphagia, unspecified: Secondary | ICD-10-CM

## 2017-07-25 DIAGNOSIS — T50905S Adverse effect of unspecified drugs, medicaments and biological substances, sequela: Secondary | ICD-10-CM

## 2017-07-25 DIAGNOSIS — J181 Lobar pneumonia, unspecified organism: Secondary | ICD-10-CM

## 2017-07-25 NOTE — Progress Notes (Signed)
Subjective:    Patient ID: Sara [name], female    DOB: 01/14/1941, 77 y.o.   MRN: [id number]  DOS:  07/25/2017 Type of visit - description : TCM 14 Interval history:  Patient was admitted to the hospital on discharge 07/16/2017 She had S OB and generalized weakness, after workup, she was diagnosed with pneumonia. Cultures were negative, influenza PCR negative. Received IV and subsequently p.o. antibiotics.  She improved and was d/c home w/ home PT. She reported dysphagia, had a barium swallow with esophageal dysmotility, EGD was not recommended due to overall status, saw speech therapy.   Last BMP satisfactory, last CBC show mild anemia, WBC is trending down back to normal. Chest x-ray: RUL opacity, rule out central obstruction, CT recommended. CT chest RUL airspace disease, no central lesion.  Review of Systems Since she left the hospital she is doing better. No fever chills. Appetite is fair. Still short of breath fatigue but not as severe as before. Minimal difficulty swallowing. No nausea, vomiting, diarrhea.  No blood in the stools. She still have generalized itching and some redness.  While in the hospital the symptoms were much increased, the patient's daughter suspect it was due to the use of Dilaudid as they did not have Hysingla. She is taking several minute was a day.  Past Medical History:  Diagnosis Date  . Anxiety   . Arthritis   . Bronchitis    hx of  . Carotid artery occlusion   . Chronic pain syndrome    pain management  . Coronary artery disease    sees Dr. Patty Sermons  . GERD (gastroesophageal reflux disease)    hx of  . H/O hiatal hernia   . Hypercholesteremia   . Hypertension    all managed by Dr. Haynes Dage bill  . Mitral valve regurgitation    trace  . Non-traumatic compression fracture of thoracic vertebra (HCC)    T9  . Stroke [facility])     Past Surgical History:  Procedure Laterality Date  . BACK SURGERY     x 3 Dr Juliene Pina, last @ [facility] ~  2005, rods, fusion, four surgery  . CARDIAC CATHETERIZATION  1994  . CAROTID ENDARTERECTOMY    . CARPAL TUNNEL RELEASE     left  . CHOLECYSTECTOMY    . ENDARTERECTOMY  04/24/2012   Procedure: ENDARTERECTOMY CAROTID;  Surgeon: Larina Earthly, MD;  Location: [facility] OR;  Service: Vascular;  Laterality: Left;  . HAND SURGERY     bilateral  . HIP FRACTURE SURGERY Left   . HIP PINNING,CANNULATED Left 02/08/2015   Procedure: CANNULATED HIP PINNING;  Surgeon: Kathryne Hitch, MD;  Location: [facility] ORS;  Service: Orthopedics;  Laterality: Left;  . HIP PINNING,CANNULATED Right 03/31/2016   Procedure: CANNULATED HIP PINNING;  Surgeon: Kathryne Hitch, MD;  Location: [facility] ORS;  Service: Orthopedics;  Laterality: Right;  . HYSTEROTOMY    . SPINAL CORD STIMULATOR INSERTION    . TEE WITHOUT CARDIOVERSION  04/12/2012   Procedure: TRANSESOPHAGEAL ECHOCARDIOGRAM (TEE);  Surgeon: Lewayne Bunting, MD;  Location: [facility] ENDOSCOPY;  Service: Cardiovascular;  Laterality: N/A;    Social History   Socioeconomic History  . Marital status: Married    Spouse name: Not on file  . Number of children: 4  . Years of education: Not on file  . Highest education level: Not on file  Occupational History  . Occupation: retired   Engineer, production  . Financial resource strain: Not on file  . Food  insecurity:    Worry: Not on file    Inability: Not on file  . Transportation needs:    Medical: Not on file    Non-medical: Not on file  Tobacco Use  . Smoking status: Former Smoker    Packs/day: 0.50    Years: 60.00    Pack years: 30.00    Types: Cigarettes  . Smokeless tobacco: Current User  . Tobacco comment: quit 04-2015  Substance and Sexual Activity  . Alcohol use: No    Alcohol/week: 0.0 oz  . Drug use: No  . Sexual activity: Never  Lifestyle  . Physical activity:    Days per week: Not on file    Minutes per session: Not on file  . Stress: Not on file  Relationships  . Social connections:    Talks on  phone: Not on file    Gets together: Not on file    Attends religious service: Not on file    Active member of club or organization: Not on file    Attends meetings of clubs or organizations: Not on file    Relationship status: Not on file  . Intimate partner violence:    Fear of current or ex partner: Not on file    Emotionally abused: Not on file    Physically abused: Not on file    Forced sexual activity: Not on file  Other Topics Concern  . Not on file  Social History Narrative   Lives w/ husband in a one story home.     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   Education: high school      Allergies as of 07/25/2017      Reactions   Morphine And Related Rash   Percocet [oxycodone-acetaminophen] Rash   Tolerates APAP (including IV), morphine, and Hydrocodone/APAP   Hydrochlorothiazide    Pt does not remember   Indomethacin    Pt does not remember   Paroxetine Hcl    Pt does not remember   Penicillins    Has patient had a PCN reaction causing immediate rash, facial/tongue/throat swelling, SOB or lightheadedness with hypotension: Yes Has patient had a PCN reaction causing severe rash involving mucus membranes or skin necrosis: No Has patient had a PCN reaction that required hospitalization No Has patient had a PCN reaction occurring within the last 10 years: No If all of the above answers are "NO", then may proceed with Cephalosporin use.   Pravachol    Pt does not remember   Quinine Derivatives    Pt does not remember   Wellbutrin [bupropion]    agitation   Zocor [simvastatin - High Dose] Other (See Comments)   Weakness/no energy      Medication List        Accurate as of 07/25/17 11:59 PM. Always use your most recent med list.          amLODipine 10 MG tablet Commonly known as:  NORVASC Take 1 tablet (10 mg total) by mouth daily.   aspirin EC 81 MG tablet Take 81 mg by mouth daily.   atorvastatin 40 MG tablet Commonly known  as:  LIPITOR Take 0.5 tablets (20 mg total) by mouth daily.   diazepam 5 MG tablet Commonly known as:  VALIUM Take 1 tablet (5 mg total) by mouth daily as needed.   donepezil 5 MG tablet Commonly known as:  ARICEPT Take 1 tablet (5 mg total)  by mouth at bedtime.   escitalopram 10 MG tablet Commonly known as:  LEXAPRO Take 10 mg by mouth daily.   gabapentin 300 MG capsule Commonly known as:  NEURONTIN Take 900 mg by mouth at bedtime.   HYDROcodone-acetaminophen 10-325 MG tablet Commonly known as:  NORCO Take 1 tablet by mouth every 4 (four) hours as needed for moderate pain (for pain).   HYSINGLA ER 40 MG T24a Generic drug:  HYDROcodone Bitartrate ER TK 1 T PO QD   metoprolol tartrate 50 MG tablet Commonly known as:  LOPRESSOR Take 0.5 tablets (25 mg total) by mouth 2 (two) times daily.   omeprazole 20 MG capsule Commonly known as:  PRILOSEC Take 1 capsule (20 mg total) by mouth daily.   vitamin B-12 1000 MCG tablet Commonly known as:  CYANOCOBALAMIN Take 1,000 mcg by mouth daily.          Objective:   Physical Exam BP 116/76 (BP Location: Left Arm, Patient Position: Sitting, Cuff Size: Small)   Pulse (!) 56   Temp 98.1 F (36.7 C) (Oral)   Resp 16   Ht 5\' 4"  (1.626 m)   Wt 140 lb (63.5 kg)   SpO2 96%   BMI 24.03 kg/m  General:   Well developed, elderly lady, in no distress, slightly underweight appearing. HEENT:  Normocephalic . Face symmetric, atraumatic Neck: No mass or significant lymphadenopathy is Lungs:  Slightly decreased breath sounds but clear Normal respiratory effort, no intercostal retractions, no accessory muscle use. Heart: RRR,  no murmur.  No pretibial edema bilaterally  Skin: Mild erythema without warmness at the lower abdomen back.  At the extremities she has evidence of frequent scratching Neurologic:  alert & oriented X3.  Speech normal, gait assisted by rolling walker. Psych--  Cognition and judgment appear intact.    Cooperative with normal attention span and concentration.  Behavior appropriate. No anxious or depressed appearing.      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 Depression started Lexapro around 04-2011 CV:Dr. Brackbill --CAD  --H/o Stroke 2013:non hemorrhagic infarcts scattered throughout the [place] . 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 She is back home, has home PT, plan is to do a chest x-ray in 3 weeks, follow-up in 2 months Dysphagia: Was evaluated at the hospital, she is better, they suggested possibly GI eval, at this point the patient and her daughter would be very reluctant to have any further eval.  Symptoms are now minimal. Generalized itching: Continue to be  an issue, sxs were acutely worse when she was in the hospital and the daughter suspect that getting Dilaudid was the culprit (the   hospital did not have Hysingla thus got Dilaudid).  Etiology is not clear to me but the rash is possibly related to medication.  Will refer her to the allergist, okay to cancel that appointment if she figures out that it was related to pain medication and meds are changed, she plans to see her pain management doctor soon. RTC 2 months

## 2017-07-25 NOTE — Progress Notes (Signed)
Pre visit review using our clinic review tool, if applicable. No additional management support is needed unless otherwise documented below in the visit note. 

## 2017-07-25 NOTE — Patient Instructions (Signed)
  GO TO THE FRONT DESK Schedule your next appointment for a follow-up in 2 months   Come back to the first floor and get a chest x-ray 3 weeks from today  Minimize the use of Benadryl to 1 tablet twice a day

## 2017-07-26 NOTE — Assessment & Plan Note (Signed)
Community-acquired pneumonia: S/p admission, finished antibiotics, overall recuperating well. She is back home, has home PT, plan is to do a chest x-ray in 3 weeks, follow-up in 2 months Dysphagia: Was evaluated at the hospital, she is better, they suggested possibly GI eval, at this point the patient and her daughter would be very reluctant to have any further eval.  Symptoms are now minimal. Generalized itching: Continue to be an issue, sxs were acutely worse when she was in the hospital and the daughter suspect that getting Dilaudid was the culprit (the  hospital did not have Hysingla thus got Dilaudid).  Etiology is not clear to me but the rash is possibly related to medication.  Will refer her to the allergist, okay to cancel that appointment if she figures out that it was related to pain medication and meds are changed, she plans to see her pain management doctor soon. RTC 2 months

## 2017-07-28 ENCOUNTER — Telehealth: Payer: Self-pay | Admitting: *Deleted

## 2017-07-28 NOTE — Telephone Encounter (Signed)
Received Physician Orders from [facility]; forwarded to provider/SLS 04/04

## 2017-08-01 NOTE — Telephone Encounter (Signed)
[facility] [phone]. Form sent for scanning.

## 2017-08-18 ENCOUNTER — Ambulatory Visit (HOSPITAL_BASED_OUTPATIENT_CLINIC_OR_DEPARTMENT_OTHER)
Admission: RE | Admit: 2017-08-18 | Discharge: 2017-08-18 | Disposition: A | Discharge status: Home / Self Care | Payer: Medicare Other | Source: Ambulatory Visit | Attending: Internal Medicine | Admitting: Internal Medicine

## 2017-08-18 DIAGNOSIS — J439 Emphysema, unspecified: Secondary | ICD-10-CM | POA: Diagnosis not present

## 2017-08-18 DIAGNOSIS — I700 Atherosclerosis of aorta: Secondary | ICD-10-CM | POA: Insufficient documentation

## 2017-08-18 DIAGNOSIS — I517 Cardiomegaly: Secondary | ICD-10-CM | POA: Insufficient documentation

## 2017-08-18 DIAGNOSIS — Z09 Encounter for follow-up examination after completed treatment for conditions other than malignant neoplasm: Secondary | ICD-10-CM | POA: Insufficient documentation

## 2017-08-18 DIAGNOSIS — Z8701 Personal history of pneumonia (recurrent): Secondary | ICD-10-CM | POA: Insufficient documentation

## 2017-08-18 DIAGNOSIS — J181 Lobar pneumonia, unspecified organism: Secondary | ICD-10-CM

## 2017-08-18 DIAGNOSIS — J189 Pneumonia, unspecified organism: Secondary | ICD-10-CM

## 2017-08-20 ENCOUNTER — Telehealth: Payer: Self-pay | Admitting: Internal Medicine

## 2017-08-22 ENCOUNTER — Telehealth: Payer: Self-pay | Admitting: *Deleted

## 2017-08-22 NOTE — Telephone Encounter (Signed)
Pt is requesting refill on diazepam.   Last OV: 07/25/2017 Last Fill: 01/03/2017 #30 and 2RF UDS: Pt is seen at pain clinic

## 2017-08-22 NOTE — Telephone Encounter (Signed)
Received Physician Orders/Plan of Care from [facility]; forwarded to provider/SLS 04/29

## 2017-08-22 NOTE — Telephone Encounter (Signed)
Sent!

## 2017-08-25 NOTE — Telephone Encounter (Signed)
Plan of Care signed and faxed to [facility]- [phone]. Form sent for scanning.

## 2017-08-29 ENCOUNTER — Ambulatory Visit: Payer: Medicare Other | Admitting: Internal Medicine

## 2017-09-01 ENCOUNTER — Encounter: Payer: Self-pay | Admitting: Internal Medicine

## 2017-09-01 ENCOUNTER — Ambulatory Visit (INDEPENDENT_AMBULATORY_CARE_PROVIDER_SITE_OTHER): Payer: Medicare Other | Admitting: Internal Medicine

## 2017-09-01 VITALS — BP 126/66 | HR 68 | Temp 97.8°F | Resp 16 | Ht 64.0 in | Wt 138.1 lb

## 2017-09-01 DIAGNOSIS — R221 Localized swelling, mass and lump, neck: Secondary | ICD-10-CM | POA: Diagnosis not present

## 2017-09-01 MED ORDER — CLINDAMYCIN HCL 300 MG PO CAPS: 300.0000 mg | ORAL_CAPSULE | Freq: Three times a day (TID) | ORAL | 0 refills | Status: DC

## 2017-09-01 NOTE — Progress Notes (Signed)
Subjective:    Patient ID: Sara [name], female    DOB: 01/27/1941, 77 y.o.   MRN: [id number]  DOS:  09/01/2017 Type of visit - description : Acute visit Interval history: Acute onset of swelling and pain at the right side of the neck yesterday. The area is tender to palpation. Thi s is a first time she has something of this nature. Has not taking any medication for it  Review of Systems Denies fever chills.  No difficulty swallowing or breathing. Pain increases when she chews. Denies any recent dental treatment or dental pain.   Past Medical History:  Diagnosis Date  . Anxiety   . Arthritis   . Bronchitis    hx of  . Carotid artery occlusion   . Chronic pain syndrome    pain management  . Coronary artery disease    sees Dr. Patty Sermons  . GERD (gastroesophageal reflux disease)    hx of  . H/O hiatal hernia   . Hypercholesteremia   . Hypertension    all managed by Dr. Haynes Dage bill  . Mitral valve regurgitation    trace  . Non-traumatic compression fracture of thoracic vertebra (HCC)    T9  . Stroke [facility])     Past Surgical History:  Procedure Laterality Date  . BACK SURGERY     x 3 Dr Juliene Pina, last @ [facility] ~ 2005, rods, fusion, four surgery  . CARDIAC CATHETERIZATION  1994  . CAROTID ENDARTERECTOMY    . CARPAL TUNNEL RELEASE     left  . CHOLECYSTECTOMY    . ENDARTERECTOMY  04/24/2012   Procedure: ENDARTERECTOMY CAROTID;  Surgeon: Larina Earthly, MD;  Location: [facility] OR;  Service: Vascular;  Laterality: Left;  . HAND SURGERY     bilateral  . HIP FRACTURE SURGERY Left   . HIP PINNING,CANNULATED Left 02/08/2015   Procedure: CANNULATED HIP PINNING;  Surgeon: Kathryne Hitch, MD;  Location: [facility] ORS;  Service: Orthopedics;  Laterality: Left;  . HIP PINNING,CANNULATED Right 03/31/2016   Procedure: CANNULATED HIP PINNING;  Surgeon: Kathryne Hitch, MD;  Location: [facility] ORS;  Service: Orthopedics;  Laterality: Right;  . HYSTEROTOMY    . SPINAL CORD STIMULATOR  INSERTION    . TEE WITHOUT CARDIOVERSION  04/12/2012   Procedure: TRANSESOPHAGEAL ECHOCARDIOGRAM (TEE);  Surgeon: Lewayne Bunting, MD;  Location: [facility] ENDOSCOPY;  Service: Cardiovascular;  Laterality: N/A;    Social History   Socioeconomic History  . Marital status: Married    Spouse name: Not on file  . Number of children: 4  . Years of education: Not on file  . Highest education level: Not on file  Occupational History  . Occupation: retired   Engineer, production  . Financial resource strain: Not on file  . Food insecurity:    Worry: Not on file    Inability: Not on file  . Transportation needs:    Medical: Not on file    Non-medical: Not on file  Tobacco Use  . Smoking status: Former Smoker    Packs/day: 0.50    Years: 60.00    Pack years: 30.00    Types: Cigarettes  . Smokeless tobacco: Current User  . Tobacco comment: quit 04-2015  Substance and Sexual Activity  . Alcohol use: No    Alcohol/week: 0.0 oz  . Drug use: No  . Sexual activity: Never  Lifestyle  . Physical activity:    Days per week: Not on file    Minutes per session: Not  on file  . Stress: Not on file  Relationships  . Social connections:    Talks on phone: Not on file    Gets together: Not on file    Attends religious service: Not on file    Active member of club or organization: Not on file    Attends meetings of clubs or organizations: Not on file    Relationship status: Not on file  . Intimate partner violence:    Fear of current or ex partner: Not on file    Emotionally abused: Not on file    Physically abused: Not on file    Forced sexual activity: Not on file  Other Topics Concern  . Not on file  Social History Narrative   Lives w/ husband in a one story home.     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   Education: high school      Allergies as of 09/01/2017      Reactions   Morphine And Related Rash   Percocet [oxycodone-acetaminophen] Rash   Tolerates  APAP (including IV), morphine, and Hydrocodone/APAP   Hydrochlorothiazide    Pt does not remember   Indomethacin    Pt does not remember   Paroxetine Hcl    Pt does not remember   Penicillins    Has patient had a PCN reaction causing immediate rash, facial/tongue/throat swelling, SOB or lightheadedness with hypotension: Yes Has patient had a PCN reaction causing severe rash involving mucus membranes or skin necrosis: No Has patient had a PCN reaction that required hospitalization No Has patient had a PCN reaction occurring within the last 10 years: No If all of the above answers are "NO", then may proceed with Cephalosporin use.   Pravachol    Pt does not remember   Quinine Derivatives    Pt does not remember   Wellbutrin [bupropion]    agitation   Zocor [simvastatin - High Dose] Other (See Comments)   Weakness/no energy      Medication List        Accurate as of 09/01/17 11:59 PM. Always use your most recent med list.          amLODipine 10 MG tablet Commonly known as:  NORVASC Take 1 tablet (10 mg total) by mouth daily.   aspirin EC 81 MG tablet Take 81 mg by mouth daily.   atorvastatin 40 MG tablet Commonly known as:  LIPITOR Take 0.5 tablets (20 mg total) by mouth daily.   clindamycin 300 MG capsule Commonly known as:  CLEOCIN Take 1 capsule (300 mg total) by mouth 3 (three) times daily.   diazepam 5 MG tablet Commonly known as:  VALIUM TAKE 1 TABLET BY MOUTH EVERY DAY AS NEEDED   donepezil 5 MG tablet Commonly known as:  ARICEPT Take 1 tablet (5 mg total) by mouth at bedtime.   escitalopram 10 MG tablet Commonly known as:  LEXAPRO Take 10 mg by mouth daily.   gabapentin 300 MG capsule Commonly known as:  NEURONTIN Take 900 mg by mouth at bedtime.   HYDROcodone-acetaminophen 10-325 MG tablet Commonly known as:  NORCO Take 1 tablet by mouth every 4 (four) hours as needed for moderate pain (for pain).   HYSINGLA ER 40 MG T24a Generic drug:   HYDROcodone Bitartrate ER TK 1 T PO QD   metoprolol tartrate 50 MG tablet Commonly known as:  LOPRESSOR Take 0.5 tablets (25 mg total)  by mouth 2 (two) times daily.   omeprazole 20 MG capsule Commonly known as:  PRILOSEC Take 1 capsule (20 mg total) by mouth daily.   vitamin B-12 1000 MCG tablet Commonly known as:  CYANOCOBALAMIN Take 1,000 mcg by mouth daily.          Objective:   Physical Exam  Neck:     BP 126/66 (BP Location: Left Arm, Patient Position: Sitting, Cuff Size: Small)   Pulse 68   Temp 97.8 F (36.6 C) (Oral)   Resp 16   Ht  (1.626 m)   Wt 138 lb 2 oz (62.7 kg)   SpO2 93%   BMI 23.71 kg/m  General:   Well developed, well nourished . NAD.  HEENT:  Normocephalic . Face symmetric, atraumatic Oral exam: Throat symmetric, no trismus. Has a upper denture. Lower teeth seem to be in good health, no obvious abscess upon palpation Day floor of the mouth is soft bilaterally. Neck: See graphic Skin: Not pale. Not jaundice Neurologic:  alert & oriented X3.  Speech normal, gait appropriate for age and unassisted Psych--  Cognition and judgment appear intact.  Cooperative with normal attention span and concentration.  Behavior appropriate. No anxious or depressed appearing.      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 Depression started Lexapro around 04-2011 CV:Dr. Brackbill --CAD  --H/o Stroke 2013:non hemorrhagic infarcts scattered throughout the [place] Hem[place] . 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, per CT 08/2015, 2 years [facility]: -Osteoporosis  last DEXA -hip fx 01-2015 -Vertebral FX (s/p v-plasty) - R Hip  Fx 03-2016 -Chronic back pain , s/p spinal cord stimulator  Dr Jordan Likes   Abnormal CT abd/pelvis  2017: See full report, some of the findings>> Atherosclerotic ulcer left subclavian T 8 compression fracture Infrarenal abdominal aortic aneurysm 3.5 cm, follow-up 2 years Narrow  left renal artery , SMA occluded with pancreatic collaterals Mild mesenteric adenopathy, possibly stable, consider 2 year follow-up  R distal ureter enhancement per CT: Saw urology 07-2015, Rx a ultrasound   R adrenal adenoma Smoker , quit ~ 04-2015  Urosepsis 05-2015   PLAN: Neck mass:  sialoadenitis versus lymphadenopathy.  Suspects sialoadenitis more because she does not have any obvious dental infection.  At this point no evidence of Ludwiwng's angina but she is at risk.  She is allergic to penicillin. Plan: Clindamycin, probiotics, sialagogues, warm compress. Strongly recommend prompt ER visit if she is not getting better or if she gets worse. Pneumonia: See last visit, follow-up chest x-ray completely clear. Generalized itching: See last visit, resolved Follow-up in June as recommended

## 2017-09-01 NOTE — Patient Instructions (Signed)
Warm compresses 3 times a day  Take antibiotics as prescribed for 10 days  Take over-the-counter probiotics such as ALIGN daily Suck on a lime or lemon twice a day to stimulate your gland  Go to the ER immediately if: You have fever, chills The swelling worse You have increased pain You are not improving in the next 36 hours You have difficulty swallowing or breathing

## 2017-09-01 NOTE — Progress Notes (Signed)
Pre visit review using our clinic review tool, if applicable. No additional management support is needed unless otherwise documented below in the visit note. 

## 2017-09-02 NOTE — Assessment & Plan Note (Signed)
Neck mass:  sialoadenitis versus lymphadenopathy.  Suspects sialoadenitis more because she does not have any obvious dental infection.  At this point no evidence of Ludwiwng's angina but she is at risk.  She is allergic to penicillin. Plan: Clindamycin, probiotics, sialagogues, warm compress. Strongly recommend prompt ER visit if she is not getting better or if she gets worse. Pneumonia: See last visit, follow-up chest x-ray completely clear. Generalized itching: See last visit, resolved Follow-up in June as recommended

## 2017-09-06 ENCOUNTER — Telehealth: Payer: Self-pay

## 2017-09-06 ENCOUNTER — Other Ambulatory Visit: Payer: Self-pay

## 2017-09-06 ENCOUNTER — Encounter: Payer: Self-pay | Admitting: Vascular Surgery

## 2017-09-06 ENCOUNTER — Ambulatory Visit (INDEPENDENT_AMBULATORY_CARE_PROVIDER_SITE_OTHER): Payer: Medicare Other | Admitting: Vascular Surgery

## 2017-09-06 VITALS — BP 125/77 | HR 68 | Temp 98.5°F | Resp 20 | Ht 64.0 in | Wt 140.0 lb

## 2017-09-06 DIAGNOSIS — I701 Atherosclerosis of renal artery: Secondary | ICD-10-CM

## 2017-09-06 NOTE — Telephone Encounter (Signed)
Spoke w/ Pt- she is doing much better. She thanked me for calling.

## 2017-09-06 NOTE — Progress Notes (Signed)
Vascular and Vein Specialist of [place]  Patient name: Sara [name] MRN: [id number] DOB: 06/10/1940 Sex: female  REASON FOR VISIT: Follow-up renal artery stenosis  HPI: Sara [name] is a 77 y.o. female here today for follow-up.  She has diffuse peripheral vascular occlusive disease.  She was seen to have a renal artery stenoses bilaterally by CT angiogram and recently underwent duplex in February of this year for follow-up.  This suggested a high-grade stenosis of her renal arteries.  She has no history of renal insufficiency.  Her most recent creatinine was 1.0.  She does have very well controlled hypertension.  He has multiple medical issues including coronary artery disease and chronic pain as outlined below  Past Medical History:  Diagnosis Date  . Anxiety   . Arthritis   . Bronchitis    hx of  . Carotid artery occlusion   . Chronic pain syndrome    pain management  . Coronary artery disease    sees Dr. Patty Sermons  . GERD (gastroesophageal reflux disease)    hx of  . H/O hiatal hernia   . Hypercholesteremia   . Hypertension    all managed by Dr. Haynes Dage bill  . Mitral valve regurgitation    trace  . Non-traumatic compression fracture of thoracic vertebra (HCC)    T9  . Stroke [facility])     Family History  Problem Relation Age of Onset  . Heart attack Father   . Heart disease Father        before age 50  . Heart disease Sister   . Cancer Sister   . Heart disease Sister     SOCIAL HISTORY: Social History   Tobacco Use  . Smoking status: Former Smoker    Packs/day: 0.50    Years: 60.00    Pack years: 30.00    Types: Cigarettes  . Smokeless tobacco: Current User  . Tobacco comment: quit 04-2015  Substance Use Topics  . Alcohol use: No    Alcohol/week: 0.0 oz    Allergies  Allergen Reactions  . Morphine And Related Rash  . Percocet [Oxycodone-Acetaminophen] Rash    Tolerates APAP (including IV), morphine, and  Hydrocodone/APAP  . Hydrochlorothiazide     Pt does not remember  . Indomethacin     Pt does not remember  . Paroxetine Hcl     Pt does not remember  . Penicillins     Has patient had a PCN reaction causing immediate rash, facial/tongue/throat swelling, SOB or lightheadedness with hypotension: Yes Has patient had a PCN reaction causing severe rash involving mucus membranes or skin necrosis: No Has patient had a PCN reaction that required hospitalization No Has patient had a PCN reaction occurring within the last 10 years: No If all of the above answers are "NO", then may proceed with Cephalosporin use.   . Pravachol     Pt does not remember  . Quinine Derivatives     Pt does not remember  . Wellbutrin [Bupropion]     agitation  . Zocor [Simvastatin - High Dose] Other (See Comments)    Weakness/no energy    Current Outpatient Medications  Medication Sig Dispense Refill  . amLODipine (NORVASC) 10 MG tablet Take 1 tablet (10 mg total) by mouth daily. 90 tablet 1  . aspirin EC 81 MG tablet Take 81 mg by mouth daily.    Marland Kitchen atorvastatin (LIPITOR) 40 MG tablet Take 0.5 tablets (20 mg total) by mouth daily. 45 tablet 5  .  clindamycin (CLEOCIN) 300 MG capsule Take 1 capsule (300 mg total) by mouth 3 (three) times daily. 30 capsule 0  . diazepam (VALIUM) 5 MG tablet TAKE 1 TABLET BY MOUTH EVERY DAY AS NEEDED 30 tablet 2  . donepezil (ARICEPT) 5 MG tablet Take 1 tablet (5 mg total) by mouth at bedtime. 30 tablet 6  . escitalopram (LEXAPRO) 10 MG tablet Take 10 mg by mouth daily.   4  . gabapentin (NEURONTIN) 300 MG capsule Take 900 mg by mouth at bedtime.    Marland Kitchen HYSINGLA ER 40 MG T24A TK 1 T PO QD  0  . metoprolol tartrate (LOPRESSOR) 50 MG tablet Take 0.5 tablets (25 mg total) by mouth 2 (two) times daily. 90 tablet 1  . omeprazole (PRILOSEC) 20 MG capsule Take 1 capsule (20 mg total) by mouth daily. 90 capsule 3  . OXYCODONE-ACETAMINOPHEN PO Take 5-325 mg by mouth. 1 tab TID Prn.    .  vitamin B-12 (CYANOCOBALAMIN) 1000 MCG tablet Take 1,000 mcg by mouth daily.     No current facility-administered medications for this visit.     REVIEW OF SYSTEMS:   denotes positive finding,  denotes negative finding Cardiac  Comments:  Chest pain or chest pressure:    Shortness of breath upon exertion: x   Short of breath when lying flat:    Irregular heart rhythm:        Vascular    Pain in calf, thigh, or hip brought on by ambulation: x   Pain in feet at night that wakes you up from your sleep:  x   Blood clot in your veins:    Leg swelling:           PHYSICAL EXAM: Vitals:   09/06/17 1512  BP: 125/77  Pulse: 68  Resp: 20  Temp: 98.5 F (36.9 C)  TempSrc: Oral  SpO2: 97%  Weight: 140 lb (63.5 kg)  Height:  (1.626 m)    GENERAL: The patient is a well-nourished female, in no acute distress. The vital signs are documented above. CARDIOVASCULAR: Carotid arteries without bruits bilaterally.  2+ radial pulses bilaterally.  Abdomen soft and no abdominal bruits noted PULMONARY: There is good air exchange  MUSCULOSKELETAL: There are no major deformities or cyanosis. NEUROLOGIC: No focal weakness or paresthesias are detected. SKIN: There are no ulcers or rashes noted. PSYCHIATRIC: The patient has a normal affect.  DATA:  I reviewed her duplex and also reviewed her recent CT angiogram which did extend through her renal arteries.  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  She has stenoses in her renal arteries bilaterally.  MEDICAL ISSUES: As discussed this at length with the patient and her daughter present.  I explained that although she does have high-grade renal artery stenosis she is not having any symptoms related to this and would not recommend any intervention or follow-up.  Specifically she does not have any renal insufficiency and does not have any uncontrolled  hypertension.  She was reassured with this discussion will see [name] again on an as-needed basis    Larina Earthly, MD [facility] Vascular and Vein Specialists of [facility] Tel [phone] Pager [phone]

## 2017-09-06 NOTE — Telephone Encounter (Signed)
-----   Message from Wanda Plump, MD sent at 09/02/2017 12:14 PM EDT ----- Regarding: Please check on her Monday, open a phone note She had a neck mass, is she better?

## 2017-09-06 NOTE — Telephone Encounter (Signed)
thx

## 2017-09-22 NOTE — Progress Notes (Addendum)
Subjective:   Sara [name] is a 77 y.o. female who presents for Medicare Annual (Subsequent) preventive examination.  Review of Systems: No ROS.  Medicare Wellness Visit. Additional risk factors are reflected in the social history. Cardiac Risk Factors include: advanced age (>28men, >34 women) Sleep patterns:Sleep well about 8 hrs. Home Safety/Smoke Alarms: Feels safe in home. Smoke alarms in place.  Living environment; residence and Solicitor: lives with husband. 1 story home Daughter lives close by. Grab rails in tub.    Female:        Mammo- declines     Dexa scan- declines        CCS- declines Objective:     Vitals: BP 126/72 (BP Location: Right Arm, Patient Position: Sitting, Cuff Size: Normal) Comment: all vitals done by Conrad [place] CMA  Pulse 68   Ht  (1.626 m)   Wt 141 lb (64 kg)   SpO2 98%   BMI 24.20 kg/m   Body mass index is 24.2 kg/m.  Advanced Directives 09/26/2017 09/06/2017 07/13/2017 07/12/2017 09/28/2016 03/30/2016 02/24/2016  Does Patient Have a Medical Advance Directive? Yes Yes Yes No Yes Yes Yes  Type of Estate agent of [place];Living will Healthcare Power of [employer] of [place];Living will - Healthcare Power of [place];Living will Healthcare Power of [place];Living will Healthcare Power of [place];Living will  Does patient want to make changes to medical advance directive? - - No - Patient declined - - No - Patient declined -  Copy of Healthcare Power of Attorney in Chart? Yes - No - copy requested - - No - copy requested -  Would patient like information on creating a medical advance directive? - - - No - Patient declined - No - Patient declined -  Pre-existing out of facility DNR order (yellow form or pink MOST form) - - - - - - -    Tobacco Social History   Tobacco Use  Smoking Status Former Smoker  . Packs/day: 0.50  . Years: 60.00  . Pack years: 30.00  . Types: Cigarettes  Smokeless Tobacco  Current User  Tobacco Comment   quit 04-2015     Ready to quit: Not Answered Counseling given: Not Answered Comment: quit 04-2015   Clinical Intake:     Pain : No/denies pain                 Past Medical History:  Diagnosis Date  . Anxiety   . Arthritis   . Bronchitis    hx of  . Carotid artery occlusion   . Chronic pain syndrome    pain management  . Coronary artery disease    sees Dr. Patty Sermons  . GERD (gastroesophageal reflux disease)    hx of  . H/O hiatal hernia   . Hypercholesteremia   . Hypertension    all managed by Dr. Haynes Dage bill  . Mitral valve regurgitation    trace  . Non-traumatic compression fracture of thoracic vertebra (HCC)    T9  . Pneumonia 05/27/2017  . Stroke [facility])    Past Surgical History:  Procedure Laterality Date  . BACK SURGERY     x 3 Dr Juliene Pina, last @ [facility] ~ 2005, rods, fusion, four surgery  . CARDIAC CATHETERIZATION  1994  . CAROTID ENDARTERECTOMY    . CARPAL TUNNEL RELEASE     left  . CHOLECYSTECTOMY    . ENDARTERECTOMY  04/24/2012   Procedure: ENDARTERECTOMY CAROTID;  Surgeon: Larina Earthly, MD;  Location: [facility] OR;  Service: Vascular;  Laterality: Left;  . HAND SURGERY     bilateral  . HIP FRACTURE SURGERY Left   . HIP PINNING,CANNULATED Left 02/08/2015   Procedure: CANNULATED HIP PINNING;  Surgeon: Kathryne Hitch, MD;  Location: [facility] ORS;  Service: Orthopedics;  Laterality: Left;  . HIP PINNING,CANNULATED Right 03/31/2016   Procedure: CANNULATED HIP PINNING;  Surgeon: Kathryne Hitch, MD;  Location: [facility] ORS;  Service: Orthopedics;  Laterality: Right;  . HYSTEROTOMY    . SPINAL CORD STIMULATOR INSERTION    . TEE WITHOUT CARDIOVERSION  04/12/2012   Procedure: TRANSESOPHAGEAL ECHOCARDIOGRAM (TEE);  Surgeon: Lewayne Bunting, MD;  Location: [facility] ENDOSCOPY;  Service: Cardiovascular;  Laterality: N/A;   Family History  Problem Relation Age of Onset  . Heart attack Father   . Heart disease Father         before age 78  . Heart disease Sister   . Cancer Sister   . Heart disease Sister    Social History   Socioeconomic History  . Marital status: Married    Spouse name: Not on file  . Number of children: 4  . Years of education: Not on file  . Highest education level: Not on file  Occupational History  . Occupation: retired   Engineer, production  . Financial resource strain: Not on file  . Food insecurity:    Worry: Not on file    Inability: Not on file  . Transportation needs:    Medical: Not on file    Non-medical: Not on file  Tobacco Use  . Smoking status: Former Smoker    Packs/day: 0.50    Years: 60.00    Pack years: 30.00    Types: Cigarettes  . Smokeless tobacco: Current User  . Tobacco comment: quit 04-2015  Substance and Sexual Activity  . Alcohol use: No    Alcohol/week: 0.0 oz  . Drug use: No  . Sexual activity: Never  Lifestyle  . Physical activity:    Days per week: Not on file    Minutes per session: Not on file  . Stress: Not on file  Relationships  . Social connections:    Talks on phone: Not on file    Gets together: Not on file    Attends religious service: Not on file    Active member of club or organization: Not on file    Attends meetings of clubs or organizations: Not on file    Relationship status: Not on file  Other Topics Concern  . Not on file  Social History Narrative   Lives w/ husband in a one story home.     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   Education: high school    Outpatient Encounter Medications as of 09/26/2017  Medication Sig  . amLODipine (NORVASC) 10 MG tablet Take 1 tablet (10 mg total) by mouth daily.  Marland Kitchen aspirin EC 81 MG tablet Take 81 mg by mouth daily.  Marland Kitchen atorvastatin (LIPITOR) 40 MG tablet Take 0.5 tablets (20 mg total) by mouth daily.  . diazepam (VALIUM) 5 MG tablet TAKE 1 TABLET BY MOUTH EVERY DAY AS NEEDED  . donepezil (ARICEPT) 5 MG tablet Take 1 tablet (5 mg total) by mouth at  bedtime.  Marland Kitchen escitalopram (LEXAPRO) 10 MG tablet Take 10 mg by mouth daily.   Marland Kitchen gabapentin (NEURONTIN) 300 MG capsule Take 900 mg by mouth at  bedtime.  Marland Kitchen HYSINGLA ER 40 MG T24A TK 1 T PO QD  . metoprolol tartrate (LOPRESSOR) 50 MG tablet Take 0.5 tablets (25 mg total) by mouth 2 (two) times daily.  Marland Kitchen omeprazole (PRILOSEC) 20 MG capsule Take 1 capsule (20 mg total) by mouth daily.  . OXYCODONE-ACETAMINOPHEN PO Take 5-325 mg by mouth. 1 tab TID Prn.  . vitamin B-12 (CYANOCOBALAMIN) 1000 MCG tablet Take 1,000 mcg by mouth daily.  . [DISCONTINUED] clindamycin (CLEOCIN) 300 MG capsule Take 1 capsule (300 mg total) by mouth 3 (three) times daily. (Patient not taking: Reported on 09/26/2017)   No facility-administered encounter medications on file as of 09/26/2017.     Activities of Daily Living In your present state of health, do you have any difficulty performing the following activities: 09/26/2017 07/13/2017  Hearing? N N  Vision? N N  Difficulty concentrating or making decisions? N N  Comment - -  Walking or climbing stairs? N Y  Comment - -  Dressing or bathing? N Y  Doing errands, shopping? Y N  Comment no longer driving Chief Operating Officer and eating ? N -  Using the Toilet? N -  In the past six months, have you accidently leaked urine? - -  Do you have problems with loss of bowel control? N -  Managing your Medications? N -  Managing your Finances? N -  Housekeeping or managing your Housekeeping? N -  Comment - -  Some recent data might be hidden    Patient Care Team: Wanda Plump, MD as PCP - General (Internal Medicine) Ardell Isaacs, MD as Consulting Physician (Pain Medicine) Bjorn Pippin, MD as Attending Physician (Urology)    Assessment:   This is a routine wellness examination for Krystan.Physical assessment deferred to PCP.  Exercise Activities and Dietary recommendations Current Exercise Habits: The patient does not participate in regular exercise at present, Exercise limited  by: None identified Diet (meal preparation, eat out, water intake, caffeinated beverages, dairy products, fruits and vegetables): well balanced, on average, 2 meals per day       Goals    None      Fall Risk Fall Risk  09/26/2017 09/28/2016 05/24/2016 02/11/2016 01/01/2016  Falls in the past year? No Yes Yes No Yes  Number falls in past yr: - 1 1 - 2 or more  Injury with Fall? - Yes Yes - Yes  Risk Factor Category  - - High Fall Risk - High Fall Risk  Risk for fall due to : - Impaired balance/gait;Impaired mobility Impaired balance/gait;Impaired mobility - Impaired balance/gait;Impaired mobility  Risk for fall due to: Comment - - - - -  Follow up - Education provided;Falls prevention discussed Falls evaluation completed;Education provided;Falls prevention discussed - Falls evaluation completed;Education provided;Falls prevention discussed   Depression Screen PHQ 2/9 Scores 09/26/2017 09/28/2016 02/11/2016 12/12/2015  PHQ - 2 Score 0 1 0 0     Cognitive Function MMSE - Mini Mental State Exam 09/26/2017 09/28/2016  Not completed: - (No Data)  Orientation to time 5 5  Orientation to Place 5 5  Registration 3 3  Attention/ Calculation 4 1  Recall 2 0  Language- name 2 objects 2 2  Language- repeat 0 1  Language- follow 3 step command 3 3  Language- read & follow direction 1 1  Write a sentence 1 1  Copy design 1 0  Total score 27 22        Immunization History  Administered Date(s) Administered  .  Influenza, High Dose Seasonal PF 04/06/2014, 02/11/2016  . Influenza,inj,Quad PF,6+ Mos 01/22/2013, 02/08/2015  . Influenza-Unspecified 01/28/2017  . Pneumococcal Conjugate-13 02/19/2015  . Pneumococcal Polysaccharide-23 12/12/2015  . Td 02/11/2016    Screening Tests Health Maintenance  Topic Date Due  . MAMMOGRAM  10/09/2013  . INFLUENZA VACCINE  11/24/2017  . TETANUS/TDAP  02/10/2026  . DEXA SCAN  Completed  . PNA vac Low Risk Adult  Completed      Plan:    Please schedule your  next medicare wellness visit with me in 1 yr.  Continue to eat heart healthy diet (full of fruits, vegetables, whole grains, lean protein, water--limit salt, fat, and sugar intake) and increase physical activity as tolerated.  Continue doing brain stimulating activities (puzzles, reading, adult coloring books, staying active) to keep memory sharp.    I have personally reviewed and noted the following in the patient's chart:   . Medical and social history . Use of alcohol, tobacco or illicit drugs  . Current medications and supplements . Functional ability and status . Nutritional status . Physical activity . Advanced directives . List of other physicians . Hospitalizations, surgeries, and ER visits in previous 12 months . Vitals . Screenings to include cognitive, depression, and falls . Referrals and appointments  In addition, I have reviewed and discussed with patient certain preventive protocols, quality metrics, and best practice recommendations. A written personalized care plan for preventive services as well as general preventive health recommendations were provided to patient.     Mady Haagensen [place], [place]  09/26/2017  [name], MD

## 2017-09-26 ENCOUNTER — Encounter: Payer: Self-pay | Admitting: Internal Medicine

## 2017-09-26 ENCOUNTER — Encounter: Payer: Self-pay | Admitting: *Deleted

## 2017-09-26 ENCOUNTER — Ambulatory Visit (INDEPENDENT_AMBULATORY_CARE_PROVIDER_SITE_OTHER): Payer: Medicare Other | Admitting: Internal Medicine

## 2017-09-26 ENCOUNTER — Ambulatory Visit (INDEPENDENT_AMBULATORY_CARE_PROVIDER_SITE_OTHER): Payer: Medicare Other | Admitting: *Deleted

## 2017-09-26 VITALS — BP 126/72 | HR 68 | Ht 64.0 in | Wt 141.0 lb

## 2017-09-26 VITALS — BP 126/72 | HR 68 | Temp 97.7°F | Resp 16 | Ht 64.0 in | Wt 141.5 lb

## 2017-09-26 DIAGNOSIS — Z0000 Encounter for general adult medical examination without abnormal findings: Secondary | ICD-10-CM | POA: Diagnosis not present

## 2017-09-26 DIAGNOSIS — R1310 Dysphagia, unspecified: Secondary | ICD-10-CM

## 2017-09-26 DIAGNOSIS — M810 Age-related osteoporosis without current pathological fracture: Secondary | ICD-10-CM

## 2017-09-26 DIAGNOSIS — R221 Localized swelling, mass and lump, neck: Secondary | ICD-10-CM

## 2017-09-26 MED ORDER — ALENDRONATE SODIUM 70 MG PO TABS: 70.0000 mg | ORAL_TABLET | ORAL | 12 refills | Status: DC

## 2017-09-26 NOTE — Progress Notes (Signed)
Subjective:    Patient ID: Sara [name], female    DOB: 07/11/1940, 77 y.o.   MRN: [id number]  DOS:  09/26/2017 Type of visit - description : f/u Interval history: Since the last visit, neck mass quickly resolved. Generalized itching resolved Had malaise and fatigue.  Gradually better. Osteoporosis, not on any medication at this point   Review of Systems No chest pain or difficulty breathing No blood in the stools no change in the color of the stools. No nausea or vomiting Denies any difficulty swallowing at this point  Past Medical History:  Diagnosis Date  . Anxiety   . Arthritis   . Bronchitis    hx of  . Carotid artery occlusion   . Chronic pain syndrome    pain management  . Coronary artery disease    sees Dr. Patty SermonsBrackbill  . GERD (gastroesophageal reflux disease)    hx of  . H/O hiatal hernia   . Hypercholesteremia   . Hypertension    all managed by Dr. Haynes DageBrack bill  . Mitral valve regurgitation    trace  . Non-traumatic compression fracture of thoracic vertebra (HCC)    T9  . Pneumonia 05/27/2017  . Stroke [facility](HCC)     Past Surgical History:  Procedure Laterality Date  . BACK SURGERY     x 3 Dr Juliene PinaGeoffrey, [facility], rods, fusion, four surgery  . CARDIAC CATHETERIZATION  1994  . CAROTID ENDARTERECTOMY    . CARPAL TUNNEL RELEASE     left  . CHOLECYSTECTOMY    . ENDARTERECTOMY  04/24/2012   Procedure: ENDARTERECTOMY CAROTID;  Surgeon: Larina Earthlyodd F Early, MD;  [facility];  Service: Vascular;  Laterality: Left;  . HAND SURGERY     bilateral  . HIP FRACTURE SURGERY Left   . HIP PINNING,CANNULATED Left 02/08/2015   Procedure: CANNULATED HIP PINNING;  Surgeon: Kathryne Hitchhristopher Y Blackman, MD;  Location: [facility] ORS;  Service: Orthopedics;  Laterality: Left;  . HIP PINNING,CANNULATED Right 03/31/2016   Procedure: CANNULATED HIP PINNING;  Surgeon: Kathryne Hitchhristopher Y Blackman, MD;  Location: [facility] ORS;  Service: Orthopedics;  Laterality: Right;  . HYSTEROTOMY    . SPINAL  CORD STIMULATOR INSERTION    . TEE WITHOUT CARDIOVERSION  04/12/2012   Procedure: TRANSESOPHAGEAL ECHOCARDIOGRAM (TEE);  Surgeon: Lewayne BuntingBrian S Crenshaw, MD;  [facility];  Service: Cardiovascular;  Laterality: N/A;    Social History   Socioeconomic History  . Marital status: Married    Spouse name: Not on file  . Number of children: 4  . Years of education: Not on file  . Highest education level: Not on file  Occupational History  . Occupation: retired   Engineer, productionocial Needs  . Financial resource strain: Not on file  . Food insecurity:    Worry: Not on file    Inability: Not on file  . Transportation needs:    Medical: Not on file    Non-medical: Not on file  Tobacco Use  . Smoking status: Former Smoker    Packs/day: 0.50    Years: 60.00    Pack years: 30.00    Types: Cigarettes  . Smokeless tobacco: Current User  . Tobacco comment: quit 04-2015  Substance and Sexual Activity  . Alcohol use: No    Alcohol/week: 0.0 oz  . Drug use: No  . Sexual activity: Never  Lifestyle  . Physical activity:    Days per week: Not on file    Minutes per session: Not on file  .  Stress: Not on file  Relationships  . Social connections:    Talks on phone: Not on file    Gets together: Not on file    Attends religious service: Not on file    Active member of club or organization: Not on file    Attends meetings of clubs or organizations: Not on file    Relationship status: Not on file  . Intimate partner violence:    Fear of current or ex partner: Not on file    Emotionally abused: Not on file    Physically abused: Not on file    Forced sexual activity: Not on file  Other Topics Concern  . Not on file  Social History Narrative   Lives w/ husband in a one story home.     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   Education: high school      Allergies as of 09/26/2017      Reactions   Morphine And Related Rash   Percocet [oxycodone-acetaminophen]  Rash   Tolerates APAP (including IV), morphine, and Hydrocodone/APAP   Hydrochlorothiazide    Pt does not remember   Indomethacin    Pt does not remember   Paroxetine Hcl    Pt does not remember   Penicillins    Has patient had a PCN reaction causing immediate rash, facial/tongue/throat swelling, SOB or lightheadedness with hypotension: Yes Has patient had a PCN reaction causing severe rash involving mucus membranes or skin necrosis: No Has patient had a PCN reaction that required hospitalization No Has patient had a PCN reaction occurring within the last 10 years: No If all of the above answers are "NO", then may proceed with Cephalosporin use.   Pravachol    Pt does not remember   Quinine Derivatives    Pt does not remember   Wellbutrin [bupropion]    agitation   Zocor [simvastatin - High Dose] Other (See Comments)   Weakness/no energy      Medication List        Accurate as of 09/26/17 11:59 PM. Always use your most recent med list.          alendronate 70 MG tablet Commonly known as:  FOSAMAX Take 1 tablet (70 mg total) by mouth once a week. Take with a full glass of water on an empty stomach.   amLODipine 10 MG tablet Commonly known as:  NORVASC Take 1 tablet (10 mg total) by mouth daily.   aspirin EC 81 MG tablet Take 81 mg by mouth daily.   atorvastatin 40 MG tablet Commonly known as:  LIPITOR Take 0.5 tablets (20 mg total) by mouth daily.   diazepam 5 MG tablet Commonly known as:  VALIUM TAKE 1 TABLET BY MOUTH EVERY DAY AS NEEDED   donepezil 5 MG tablet Commonly known as:  ARICEPT Take 1 tablet (5 mg total) by mouth at bedtime.   escitalopram 10 MG tablet Commonly known as:  LEXAPRO Take 10 mg by mouth daily.   gabapentin 300 MG capsule Commonly known as:  NEURONTIN Take 900 mg by mouth at bedtime.   HYSINGLA ER 40 MG T24a Generic drug:  HYDROcodone Bitartrate ER TK 1 T PO QD   metoprolol tartrate 50 MG tablet Commonly known as:   LOPRESSOR Take 0.5 tablets (25 mg total) by mouth 2 (two) times daily.   omeprazole 20 MG capsule Commonly known as:  PRILOSEC Take 1 capsule (20  mg total) by mouth daily.   OXYCODONE-ACETAMINOPHEN PO Take 5-325 mg by mouth. 1 tab TID Prn.   vitamin B-12 1000 MCG tablet Commonly known as:  CYANOCOBALAMIN Take 1,000 mcg by mouth daily.          Objective:   Physical Exam BP 126/72 (BP Location: Right Arm, Patient Position: Sitting, Cuff Size: Small)   Pulse 68   Temp 97.7 F (36.5 C) (Oral)   Resp 16   Ht 5\' 4"  (1.626 m)   Wt 141 lb 8 oz (64.2 kg)   SpO2 98%   BMI 24.29 kg/m  General:   Well developed, well nourished . NAD.  HEENT:  Normocephalic . Face symmetric, atraumatic Neck: No dominant mass Lungs:  CTA B Normal respiratory effort, no intercostal retractions, no accessory muscle use. Heart: RRR,  no murmur.  No pretibial edema bilaterally  Skin: Not pale. Not jaundice Neurologic:  alert & oriented X3.  Speech normal, gait appropriate for age and assisted by a cane Psych--  Cognition and judgment appear intact.  Cooperative with normal attention span and concentration.  Behavior appropriate. No anxious or depressed appearing.      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 Depression started Lexapro around 04-2011 CV:Dr. Brackbill --CAD  --H/o Stroke 2013:non hemorrhagic infarcts scattered throughout the [place] . 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  she briefly tried Fosamax but it was a stopped due to difficulty swallowing . Denies problems with dysphagia at this point ; we talked about pros and cons of osteoporosis treatment, we agreed to start Fosamax, precautions carefully discussed with the patient and her daughter. Generalized fatigue: Doing better in the last few weeks Renal artery stenosis: Last seen by vascular surgery 09/06/2017, they recommended no intervention at this point. RTC  3-4 months

## 2017-09-26 NOTE — Patient Instructions (Signed)
Next visit in 3 to 4 months, please make an appointment  Start Fosamax 1 tablet a week. Take it on a empty stomach, with lots of water.  Call if you have problems swallowing the tablet or if you develop chest pain or acid reflux afterwards.

## 2017-09-26 NOTE — Patient Instructions (Signed)
Please schedule your next medicare wellness visit with me in 1 yr.  Continue to eat heart healthy diet (full of fruits, vegetables, whole grains, lean protein, water--limit salt, fat, and sugar intake) and increase physical activity as tolerated.  Continue doing brain stimulating activities (puzzles, reading, adult coloring books, staying active) to keep memory sharp.

## 2017-09-26 NOTE — Progress Notes (Signed)
Pre visit review using our clinic review tool, if applicable. No additional management support is needed unless otherwise documented below in the visit note. 

## 2017-09-27 NOTE — Assessment & Plan Note (Signed)
Neck mass: Quickly resolved after antibiotics, exam is back to baseline Osteoporosis: Last year, Reclast was expensive and Prolia was not okay by her insurance, currently on no therapy.  she briefly tried Fosamax but it was a stopped due to difficulty swallowing . Denies problems with dysphagia at this point ; we talked about pros and cons of osteoporosis treatment, we agreed to start Fosamax, precautions carefully discussed with the patient and her daughter. Generalized fatigue: Doing better in the last few weeks Renal artery stenosis: Last seen by vascular surgery 09/06/2017, they recommended no intervention at this point. RTC  3-4 months

## 2017-09-29 ENCOUNTER — Ambulatory Visit: Payer: Medicare Other | Admitting: *Deleted

## 2017-10-11 ENCOUNTER — Telehealth: Payer: Self-pay | Admitting: Internal Medicine

## 2017-10-11 NOTE — Telephone Encounter (Signed)
Patient daughter dropped off paper work. Paperwork placed in front office tray.

## 2017-10-17 NOTE — Telephone Encounter (Signed)
Spoke w/ Inetta Fermoina- Pt's daughter, informed that handicap placard form is ready for pick up at front desk. Inetta Fermoina verbalized understanding.

## 2017-11-14 ENCOUNTER — Other Ambulatory Visit: Payer: Self-pay | Admitting: Internal Medicine

## 2017-11-21 ENCOUNTER — Ambulatory Visit (INDEPENDENT_AMBULATORY_CARE_PROVIDER_SITE_OTHER): Payer: Medicare Other | Admitting: Physician Assistant

## 2017-11-21 ENCOUNTER — Encounter (INDEPENDENT_AMBULATORY_CARE_PROVIDER_SITE_OTHER): Payer: Self-pay | Admitting: Physician Assistant

## 2017-11-21 ENCOUNTER — Ambulatory Visit (INDEPENDENT_AMBULATORY_CARE_PROVIDER_SITE_OTHER): Payer: Self-pay

## 2017-11-21 DIAGNOSIS — M25561 Pain in right knee: Secondary | ICD-10-CM | POA: Diagnosis not present

## 2017-11-21 MED ORDER — METHYLPREDNISOLONE ACETATE 40 MG/ML IJ SUSP
40.0000 mg | INTRAMUSCULAR | Status: AC | PRN
  Administered 2017-11-21: 40 mg via INTRA_ARTICULAR

## 2017-11-21 MED ORDER — LIDOCAINE HCL 1 % IJ SOLN
3.0000 mL | INTRAMUSCULAR | Status: AC | PRN
  Administered 2017-11-21: 3 mL

## 2017-11-21 NOTE — Progress Notes (Addendum)
Office Visit Note   Patient: Sara [name]           Date of Birth: 01/21/1941           MRN: [id number] Visit Date: 11/21/2017              Requested by: Wanda Plump, MD [address] [place], [place] [address] PCP: Wanda Plump, MD   Assessment & Plan: Visit Diagnoses:  1. Acute pain of right knee     Plan: We will have her work on range of motion of the right knee.  She will follow-up with [name] in 2 weeks check her progress lack of.  She continues to have mechanical symptoms and/ or effusion in the right knee consider MRI to rule out meniscal tear.  Follow-Up Instructions: Return in about 2 weeks (around 12/05/2017).   Orders:  Orders Placed This Encounter  Procedures  . Large Joint Inj  . XR Knee 1-2 Views Right   No orders of the defined types were placed in this encounter.     Procedures: Large Joint Inj: R knee on 11/21/2017 3:02 PM Indications: pain Details: 22 G 1.5 in needle, anterolateral approach  Arthrogram: No  Medications: 3 mL lidocaine 1 %; 40 mg methylPREDNISolone acetate 40 MG/ML Aspirate: 30 mL yellow Outcome: tolerated well, no immediate complications Procedure, treatment alternatives, risks and benefits explained, specific risks discussed. Consent was given by the patient. Immediately prior to procedure a time out was called to verify the correct patient, procedure, equipment, support staff and site/side marked as required. Patient was prepped and draped in the usual sterile fashion.       Clinical Data: No additional findings.   Subjective: Chief Complaint  Patient presents with  . Right Knee - Pain    HPI Sara [name] comes in today for right knee pain.  It has been sometime since she was last seen in our office.  She has a history of a cannulated pinning left hip fracture.  Overall hip is doing well.  She is complaining of right knee pain mostly medial aspect of the knee she reports her pain started after she slid off the  couch about 2 weeks ago.  Questions if she twisted her knee.  She now is having to walk with a rolling walker and she is walking with a cane in the past due to the knee giving way and having some painful popping.  She notes swelling of the knee.  No other injury.  Review of Systems See HPI otherwise negative  Objective: Vital Signs: There were no vitals taken for this visit.  Physical Exam  Constitutional: She is oriented to person, place, and time. She appears well-developed and well-nourished. No distress.  Pulmonary/Chest: Effort normal.  Neurological: She is alert and oriented to person, place, and time.  Skin: She is not diaphoretic.  Psychiatric: She has a normal mood and affect.    Ortho Exam Right knee she has full extension and flexion to approximately 105 degrees.  Tenderness along the medial joint line of the right knee only.  Positive effusion right knee positive McMurray's.  No abnormal warmth erythema ecchymosis of either knee.  Bilateral calves are supple and nontender.  She does have some lower leg edema right slightly worse than left.  Specialty Comments:  No specialty comments available.  Imaging: Xr Knee 1-2 Views Right  Result Date: 11/21/2017 Right knee 2 views: No acute fracture.  No subluxation dislocation  of the knee.  Mild medial joint line narrowing.  Otherwise knee is well-preserved.    PMFS History: Patient Active Problem List   Diagnosis Date Noted  . Community acquired pneumonia of right upper lobe of lung (HCC) 07/13/2017  . Generalized weakness 07/13/2017  . Anxiety and depression 04/14/2016  . Leukocytosis 03/31/2016  . Renal insufficiency 03/31/2016  . Neuropathy 02/12/2016  . AAA (abdominal aortic aneurysm) without rupture (HCC) 06/22/2015  . PCP NOTES >>>>>>>>>>>>>>>>>>>>>>>>>>>>>>>>>. 06/22/2015  . Malnutrition of moderate degree 06/05/2015  . Gait disorder 06/04/2015  . Elevated transaminase level 06/04/2015  . Pre-syncope 06/04/2015    . Closed left hip fracture (HCC) 02/07/2015  . Annual physical exam >>>>>>>>>>>> 01/22/2013  . Caregiver stress 01/22/2013  . 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 Chronic back pain 08/29/2012  . Occlusion and stenosis of carotid artery without mention of cerebral infarction 05/09/2012  . Carotid stenosis, left 04/13/2012  . 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 CVA (cerebral infarction) 04/11/2012  . Arthritis 08/05/2010  . Tobacco abuse 08/05/2010  . Mitral valve regurgitation   . Hypertension   . Hypercholesteremia   . Coronary artery disease    Past Medical History:  Diagnosis Date  . Anxiety   . Arthritis   . Bronchitis    hx of  . Carotid artery occlusion   . Chronic pain syndrome    pain management  . Coronary artery disease    sees Dr. Patty SermonsBrackbill  . GERD (gastroesophageal reflux disease)    hx of  . H/O hiatal hernia   . Hypercholesteremia   . Hypertension    all managed by Dr. Haynes DageBrack bill  . Mitral valve regurgitation    trace  . Non-traumatic compression fracture of thoracic vertebra (HCC)    T9  . Pneumonia 05/27/2017  . Stroke [facility](HCC)     Family History  Problem Relation Age of Onset  . Heart attack Father   . Heart disease Father        before age 79  . Heart disease Sister   . Cancer Sister   . Heart disease Sister     Past Surgical History:  Procedure Laterality Date  . BACK SURGERY     x 3 Dr Juliene PinaGeoffrey, [facility] 2005, rods, fusion, four surgery  . CARDIAC CATHETERIZATION  1994  . CAROTID ENDARTERECTOMY    . CARPAL TUNNEL RELEASE     left  . CHOLECYSTECTOMY    . ENDARTERECTOMY  04/24/2012   Procedure: ENDARTERECTOMY CAROTID;  Surgeon: Larina Earthlyodd F Early, MD;  [facility];  Service: Vascular;  Laterality: Left;  . HAND SURGERY     bilateral  . HIP FRACTURE SURGERY Left   . HIP PINNING,CANNULATED Left 02/08/2015   Procedure: CANNULATED HIP PINNING;  Surgeon: Kathryne Hitchhristopher Y Blackman, MD;  Location: [facility] ORS;  Service:  Orthopedics;  Laterality: Left;  . HIP PINNING,CANNULATED Right 03/31/2016   Procedure: CANNULATED HIP PINNING;  Surgeon: Kathryne Hitchhristopher Y Blackman, MD;  Location: [facility] ORS;  Service: Orthopedics;  Laterality: Right;  . HYSTEROTOMY    . SPINAL CORD STIMULATOR INSERTION    . TEE WITHOUT CARDIOVERSION  04/12/2012   Procedure: TRANSESOPHAGEAL ECHOCARDIOGRAM (TEE);  Surgeon: Lewayne BuntingBrian S Crenshaw, MD;  [facility];  Service: Cardiovascular;  Laterality: N/A;   Social History   Occupational History  . Occupation: retired   Tobacco Use  . Smoking status: Former Smoker    Packs/day: 0.50    Years: 60.00    Pack years:  30.00    Types: Cigarettes  . Smokeless tobacco: Current User  . Tobacco comment: quit 04-2015  Substance and Sexual Activity  . Alcohol use: No    Alcohol/week: 0.0 oz  . Drug use: No  . Sexual activity: Never

## 2017-11-28 ENCOUNTER — Other Ambulatory Visit: Payer: Self-pay | Admitting: Internal Medicine

## 2017-12-05 ENCOUNTER — Other Ambulatory Visit (INDEPENDENT_AMBULATORY_CARE_PROVIDER_SITE_OTHER): Payer: Self-pay

## 2017-12-05 ENCOUNTER — Ambulatory Visit (INDEPENDENT_AMBULATORY_CARE_PROVIDER_SITE_OTHER): Payer: Medicare Other | Admitting: Physician Assistant

## 2017-12-05 ENCOUNTER — Encounter (INDEPENDENT_AMBULATORY_CARE_PROVIDER_SITE_OTHER): Payer: Self-pay | Admitting: Physician Assistant

## 2017-12-05 DIAGNOSIS — M25561 Pain in right knee: Secondary | ICD-10-CM

## 2017-12-05 DIAGNOSIS — G8929 Other chronic pain: Secondary | ICD-10-CM

## 2017-12-05 NOTE — Progress Notes (Signed)
HPI: Ms. Cleotis LemaSteed returns today due to right knee pain.  She states that shot helped for short period of time did not take away all of her pain.  She still ambulating with cane.  She states she is still having a fair amount of pain in the knee and some giving way no locking catching or painful popping.  Physical exam: Right knee she has full extension flexion to 105 degrees.  Tenderness along medial joint line.  Positive McMurray's.  No instability valgus varus stressing calf supple nontender.  Right foot edema.   Impression: Right knee pain.  Plan: Due to the fact she continues to have mechanical symptoms in the knee pain despite conservative treatment recommend MRI to evaluate for meniscal tear.  She will follow-up with us after the MRI to go over the results and discuss further treatment.  Questions encouraged and answered at length today.

## 2017-12-12 ENCOUNTER — Inpatient Hospital Stay
Admission: RE | Admit: 2017-12-12 | Discharge: 2017-12-12 | Disposition: A | Discharge status: Home / Self Care | Payer: Medicare Other | Source: Ambulatory Visit | Attending: Physician Assistant | Admitting: Physician Assistant

## 2017-12-13 ENCOUNTER — Telehealth (INDEPENDENT_AMBULATORY_CARE_PROVIDER_SITE_OTHER): Payer: Self-pay | Admitting: Orthopaedic Surgery

## 2017-12-13 NOTE — Telephone Encounter (Signed)
I think this would help you more than it does me??

## 2017-12-13 NOTE — Telephone Encounter (Signed)
Mri Status incomplete   Patient daughter called to advised Dr.Blackman her mothers, spinal cord stimulator needed to be fully charged and set to Mri status before visit. Patient is currently waiting for insurance approval for new charger.

## 2017-12-14 ENCOUNTER — Telehealth: Payer: Self-pay

## 2017-12-14 NOTE — Telephone Encounter (Signed)
Pt's husband Deniece PortelaWayne brought handicap placard/plate  form to be completed today at visit. Form signed- original given to [place], copy sent for scanning.

## 2017-12-15 NOTE — Telephone Encounter (Signed)
noted 

## 2017-12-19 ENCOUNTER — Ambulatory Visit
Admission: RE | Admit: 2017-12-19 | Discharge: 2017-12-19 | Disposition: A | Discharge status: Home / Self Care | Payer: Medicare Other | Source: Ambulatory Visit | Attending: Physician Assistant | Admitting: Physician Assistant

## 2017-12-19 DIAGNOSIS — G8929 Other chronic pain: Secondary | ICD-10-CM

## 2017-12-19 DIAGNOSIS — M25561 Pain in right knee: Principal | ICD-10-CM

## 2017-12-22 NOTE — Telephone Encounter (Signed)
Pt daughter Inetta Fermoina called stating pt could not do the MRI d/t her stimulator was not charged and was not able to get it set to have the MRI done so the tech with MRI suggested doing a CT.   Would Bronson CurbGil be ok to get CT instead? Please advise.

## 2017-12-22 NOTE — Telephone Encounter (Signed)
Please advise 

## 2017-12-22 NOTE — Telephone Encounter (Signed)
We can get a CT but not as good of study to evaluate for meniscal tear

## 2017-12-23 NOTE — Telephone Encounter (Signed)
Yes he said CT was fine

## 2017-12-29 ENCOUNTER — Other Ambulatory Visit (INDEPENDENT_AMBULATORY_CARE_PROVIDER_SITE_OTHER): Payer: Self-pay | Admitting: Physician Assistant

## 2017-12-29 DIAGNOSIS — G8929 Other chronic pain: Secondary | ICD-10-CM

## 2017-12-29 DIAGNOSIS — M25561 Pain in right knee: Principal | ICD-10-CM

## 2017-12-30 ENCOUNTER — Ambulatory Visit
Admission: RE | Admit: 2017-12-30 | Discharge: 2017-12-30 | Disposition: A | Discharge status: Home / Self Care | Payer: Medicare Other | Source: Ambulatory Visit | Attending: Physician Assistant | Admitting: Physician Assistant

## 2017-12-30 DIAGNOSIS — G8929 Other chronic pain: Secondary | ICD-10-CM

## 2017-12-30 DIAGNOSIS — M25561 Pain in right knee: Principal | ICD-10-CM

## 2018-01-04 ENCOUNTER — Ambulatory Visit (INDEPENDENT_AMBULATORY_CARE_PROVIDER_SITE_OTHER): Payer: Medicare Other | Admitting: Physician Assistant

## 2018-01-09 ENCOUNTER — Encounter (INDEPENDENT_AMBULATORY_CARE_PROVIDER_SITE_OTHER): Payer: Self-pay | Admitting: Physician Assistant

## 2018-01-09 ENCOUNTER — Ambulatory Visit (INDEPENDENT_AMBULATORY_CARE_PROVIDER_SITE_OTHER): Payer: Medicare Other | Admitting: Physician Assistant

## 2018-01-09 DIAGNOSIS — M25561 Pain in right knee: Secondary | ICD-10-CM

## 2018-01-09 NOTE — Progress Notes (Signed)
HPI: [name] returns today follow-up of her right knee.  Unfortunately she was unable to have a MRI due to the fact that her spinal cord stimulator cannot be placed in off mood or in MRI mode therefore she underwent a CT scan of her knee.  This showed moderate to large knee joint effusion no acute osseous findings or significant arthropathic changes.  Results reviewed with patient today.  She continues to have significant pain mainly along the medial joint line of her right knee.  She continues to have swelling of the knee.  Continues to use a cane to ambulate.  Physical exam: Right knee she has full extension flexion at least 205 to 110 degrees.  Positive effusion.  Positive McMurray's.  Tenderness along the medial joint line with palpation.  No instability valgus varus stressing.  Impression: Acute right knee pain with recurrent effusion.  Plan: Due to the fact the patient continues to have right knee pain and recurrent effusion recommend right knee arthroscopy with possible partial meniscectomy.  She will need to get medical clearance due to the fact that she has significant cardiac history.  [facility] due to her cardiac history.  See her back 1 week postop.

## 2018-01-10 ENCOUNTER — Encounter: Payer: Self-pay | Admitting: Internal Medicine

## 2018-01-10 ENCOUNTER — Ambulatory Visit: Payer: Medicare Other | Admitting: Internal Medicine

## 2018-01-10 VITALS — BP 126/68 | HR 65 | Temp 98.2°F | Resp 16 | Ht 64.0 in | Wt 145.2 lb

## 2018-01-10 DIAGNOSIS — R739 Hyperglycemia, unspecified: Secondary | ICD-10-CM | POA: Diagnosis not present

## 2018-01-10 DIAGNOSIS — M7989 Other specified soft tissue disorders: Secondary | ICD-10-CM | POA: Diagnosis not present

## 2018-01-10 DIAGNOSIS — Z01810 Encounter for preprocedural cardiovascular examination: Secondary | ICD-10-CM

## 2018-01-10 DIAGNOSIS — M810 Age-related osteoporosis without current pathological fracture: Secondary | ICD-10-CM

## 2018-01-10 DIAGNOSIS — I10 Essential (primary) hypertension: Secondary | ICD-10-CM | POA: Diagnosis not present

## 2018-01-10 DIAGNOSIS — I2510 Atherosclerotic heart disease of native coronary artery without angina pectoris: Secondary | ICD-10-CM

## 2018-01-10 MED ORDER — DONEPEZIL HCL 10 MG PO TABS: 10.0000 mg | ORAL_TABLET | Freq: Every day | ORAL | 1 refills | Status: DC

## 2018-01-10 NOTE — Progress Notes (Addendum)
Subjective:    Patient ID: Sara [name], female    DOB: 1941-02-20, 77 y.o.   MRN: [id number]  DOS:  01/10/2018 Type of visit - description : rov Interval history: Sine last OV is having problems with her right knee.  S/p  Orthopedic eval, a CT was done, unable to do a MRI. They are now recommending an arthroscopy.  The pain is  limiting to her activities, she is already taking OxyContin.  She is afraid that because of pain she is less stable and may sustain a fall. They request medical clearance. Dementia: On Aricept, daughter reports mild-gradual  decrease in memory. Osteoporosis: On Fosamax, no problems. CAD, has not seen cardiology in a while   Review of Systems  I noted right leg swelling, this is going on for approximately 2 months.   denies chest pain or difficulty breathing No nausea, vomiting, diarrhea.  Past Medical History:  Diagnosis Date  . Anxiety   . Arthritis   . Bronchitis    hx of  . Carotid artery occlusion   . Chronic pain syndrome    pain management  . Coronary artery disease    sees Dr. Patty Sermons  . GERD (gastroesophageal reflux disease)    hx of  . H/O hiatal hernia   . Hypercholesteremia   . Hypertension    all managed by Dr. Haynes Dage bill  . Mitral valve regurgitation    trace  . Non-traumatic compression fracture of thoracic vertebra (HCC)    T9  . Pneumonia 05/27/2017  . Stroke [facility])     Past Surgical History:  Procedure Laterality Date  . BACK SURGERY     x 3 Dr Juliene Pina, last @ [facility] ~ 2005, rods, fusion, four surgery  . CARDIAC CATHETERIZATION  1994  . CAROTID ENDARTERECTOMY    . CARPAL TUNNEL RELEASE     left  . CHOLECYSTECTOMY    . ENDARTERECTOMY  04/24/2012   Procedure: ENDARTERECTOMY CAROTID;  Surgeon: Larina Earthly, MD;  Location: [facility] OR;  Service: Vascular;  Laterality: Left;  . HAND SURGERY     bilateral  . HIP FRACTURE SURGERY Left   . HIP PINNING,CANNULATED Left 02/08/2015   Procedure: CANNULATED HIP PINNING;  Surgeon:  Kathryne Hitch, MD;  Location: [facility] ORS;  Service: Orthopedics;  Laterality: Left;  . HIP PINNING,CANNULATED Right 03/31/2016   Procedure: CANNULATED HIP PINNING;  Surgeon: Kathryne Hitch, MD;  Location: [facility] ORS;  Service: Orthopedics;  Laterality: Right;  . HYSTEROTOMY    . SPINAL CORD STIMULATOR INSERTION    . TEE WITHOUT CARDIOVERSION  04/12/2012   Procedure: TRANSESOPHAGEAL ECHOCARDIOGRAM (TEE);  Surgeon: Lewayne Bunting, MD;  Location: [facility] ENDOSCOPY;  Service: Cardiovascular;  Laterality: N/A;    Social History   Socioeconomic History  . Marital status: Married    Spouse name: Not on file  . Number of children: 4  . Years of education: Not on file  . Highest education level: Not on file  Occupational History  . Occupation: retired   Engineer, production  . Financial resource strain: Not on file  . Food insecurity:    Worry: Not on file    Inability: Not on file  . Transportation needs:    Medical: Not on file    Non-medical: Not on file  Tobacco Use  . Smoking status: Former Smoker    Packs/day: 0.50    Years: 60.00    Pack years: 30.00    Types: Cigarettes  . Smokeless tobacco:  Current User  . Tobacco comment: quit 04-2015  Substance and Sexual Activity  . Alcohol use: No    Alcohol/week: 0.0 standard drinks  . Drug use: No  . Sexual activity: Not Currently  Lifestyle  . Physical activity:    Days per week: Not on file    Minutes per session: Not on file  . Stress: Not on file  Relationships  . Social connections:    Talks on phone: Not on file    Gets together: Not on file    Attends religious service: Not on file    Active member of club or organization: Not on file    Attends meetings of clubs or organizations: Not on file    Relationship status: Not on file  . Intimate partner violence:    Fear of current or ex partner: Not on file    Emotionally abused: Not on file    Physically abused: Not on file    Forced sexual activity: Not on file  Other  Topics Concern  . Not on file  Social History Narrative   Lives w/ husband in a one story home.     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   Education: high school      Allergies as of 01/10/2018      Reactions   Morphine And Related Rash   Percocet [oxycodone-acetaminophen] Rash   Tolerates APAP (including IV), morphine, and Hydrocodone/APAP   Hydrochlorothiazide    Pt does not remember   Indomethacin    Pt does not remember   Paroxetine Hcl    Pt does not remember   Penicillins    Has patient had a PCN reaction causing immediate rash, facial/tongue/throat swelling, SOB or lightheadedness with hypotension: Yes Has patient had a PCN reaction causing severe rash involving mucus membranes or skin necrosis: No Has patient had a PCN reaction that required hospitalization No Has patient had a PCN reaction occurring within the last 10 years: No If all of the above answers are "NO", then may proceed with Cephalosporin use.   Pravachol    Pt does not remember   Quinine Derivatives    Pt does not remember   Wellbutrin [bupropion]    agitation   Zocor [simvastatin - High Dose] Other (See Comments)   Weakness/no energy      Medication List        Accurate as of 01/10/18 11:59 PM. Always use your most recent med list.          alendronate 70 MG tablet Commonly known as:  FOSAMAX Take 1 tablet (70 mg total) by mouth once a week. Take with a full glass of water on an empty stomach.   amLODipine 10 MG tablet Commonly known as:  NORVASC Take 1 tablet (10 mg total) by mouth daily.   aspirin EC 81 MG tablet Take 81 mg by mouth daily.   atorvastatin 40 MG tablet Commonly known as:  LIPITOR Take 0.5 tablets (20 mg total) by mouth daily.   diazepam 5 MG tablet Commonly known as:  VALIUM TAKE 1 TABLET BY MOUTH EVERY DAY AS NEEDED   donepezil 10 MG tablet Commonly known as:  ARICEPT Take 1 tablet (10 mg total) by mouth at bedtime.     escitalopram 10 MG tablet Commonly known as:  LEXAPRO Take 10 mg by mouth daily.   gabapentin 300 MG capsule Commonly known as:  NEURONTIN Take  900 mg by mouth at bedtime.   gabapentin 600 MG tablet Commonly known as:  NEURONTIN   HYSINGLA ER 40 MG T24a Generic drug:  HYDROcodone Bitartrate ER TK 1 T PO QD   metoprolol tartrate 50 MG tablet Commonly known as:  LOPRESSOR Take 0.5 tablets (25 mg total) by mouth 2 (two) times daily.   omeprazole 20 MG capsule Commonly known as:  PRILOSEC Take 1 capsule (20 mg total) by mouth daily.   Oxycodone HCl 10 MG Tabs TK 1/2 TO 1 T PO UP TO TID PRN   vitamin B-12 1000 MCG tablet Commonly known as:  CYANOCOBALAMIN Take 1,000 mcg by mouth daily.          Objective:   Physical Exam BP 126/68 (BP Location: Left Arm, Patient Position: Sitting, Cuff Size: Small)   Pulse 65   Temp 98.2 F (36.8 C) (Oral)   Resp 16   Ht 5\' 4"  (1.626 m)   Wt 145 lb 4 oz (65.9 kg)   SpO2 98%   BMI 24.93 kg/m  General:   Well developed, she is in no distress, elderly, frail-appearing.  HEENT:  Normocepha,ic . Face symmetric, atraumatic Lungs:  CTA B Normal respiratory effort, no intercostal retractions, no accessory muscle use. Heart: RRR,  no murmur.  Lower extremities: Calves: Right one is three quarters of an inch larger in circumference.  Not tender, not red. Skin: Not pale. Not jaundice Neurologic:  No formal neurological exam done however she seems to be alert & oriented X3.  Speech normal, gait limited by pain at the knee, uses cane.  Needs assistance transferring Psych--  Cognition and judgment appear intact.  Cooperative with normal attention span and concentration.  Behavior appropriate. No anxious or depressed appearing.      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 Depression started  Lexapro around 04-2011 Mild Dementia CV --CAD  --H/o Stroke 2013:non hemorrhagic infarcts scattered throughout the [place] . 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  Check a BMP and CBC CAD: Denies chest pain, controlling CV RF, on aspirin; in need of a surgical procedure, refer to cardiology for clearance. Infrarenal abdominal aortic aneurysm: Last visualized 2017. Anxiety: Rarely takes diazepam, needs a contract. Knee pain: pt reports pain is severe and feels she needs to proceed w/ an arthroscopy.  Will refer to cardiology for clearance. Osteoporosis: Tolerating Fosamax without apparent problems Leg swelling: For the last 2 months, I am somewhat concerned about a DVT,  check ultrasound. Dementia: Gradual worsening of memory per patient's daughter, I did not do a formal mental exam today, increase Aricept to 10 mg. RTC 4 months.

## 2018-01-10 NOTE — Patient Instructions (Signed)
GO TO THE LAB : Get the blood work     GO TO THE FRONT DESK Schedule your next appointment for a checkup in 4 months  Get a flu shot in October  Use your walker consistently

## 2018-01-10 NOTE — Progress Notes (Signed)
Pre visit review using our clinic review tool, if applicable. No additional management support is needed unless otherwise documented below in the visit note. 

## 2018-01-11 ENCOUNTER — Ambulatory Visit (HOSPITAL_BASED_OUTPATIENT_CLINIC_OR_DEPARTMENT_OTHER)
Admission: RE | Admit: 2018-01-11 | Discharge: 2018-01-11 | Disposition: A | Discharge status: Home / Self Care | Payer: Medicare Other | Source: Ambulatory Visit | Attending: Internal Medicine | Admitting: Internal Medicine

## 2018-01-11 DIAGNOSIS — M25461 Effusion, right knee: Secondary | ICD-10-CM | POA: Diagnosis not present

## 2018-01-11 DIAGNOSIS — M7989 Other specified soft tissue disorders: Secondary | ICD-10-CM | POA: Insufficient documentation

## 2018-01-11 LAB — BASIC METABOLIC PANEL
BUN: 26 mg/dL — ABNORMAL HIGH (ref 6–23)
CALCIUM: 8.8 mg/dL (ref 8.4–10.5)
CO2: 29 meq/L (ref 19–32)
Chloride: 103 mEq/L (ref 96–112)
Creatinine, Ser: 1.23 mg/dL — ABNORMAL HIGH (ref 0.40–1.20)
GFR: 44.69 mL/min — ABNORMAL LOW (ref >60.00)
GLUCOSE: 89 mg/dL (ref 70–99)
Potassium: 4.5 mEq/L (ref 3.5–5.1)
SODIUM: 141 meq/L (ref 135–145)

## 2018-01-11 LAB — CBC WITH DIFFERENTIAL/PLATELET
BASOS ABS: 0 10*3/uL (ref 0.0–0.1)
Basophils Relative: 0.6 % (ref 0.0–3.0)
Eosinophils Absolute: 0.5 10*3/uL (ref 0.0–0.7)
Eosinophils Relative: 6.6 % — ABNORMAL HIGH (ref 0.0–5.0)
HEMATOCRIT: 36.9 % (ref 36.0–46.0)
Hemoglobin: 12.3 g/dL (ref 12.0–15.0)
LYMPHS ABS: 2.8 10*3/uL (ref 0.7–4.0)
LYMPHS PCT: 36.1 % (ref 12.0–46.0)
MCHC: 33.3 g/dL (ref 30.0–36.0)
MCV: 90.6 fl (ref 78.0–100.0)
MONOS PCT: 8.8 % (ref 3.0–12.0)
Monocytes Absolute: 0.7 10*3/uL (ref 0.1–1.0)
NEUTROS ABS: 3.7 10*3/uL (ref 1.4–7.7)
NEUTROS PCT: 47.9 % (ref 43.0–77.0)
PLATELETS: 301 10*3/uL (ref 150.0–400.0)
RBC: 4.07 Mil/uL (ref 3.87–5.11)
RDW: 14.4 % (ref 11.5–15.5)
WBC: 7.7 10*3/uL (ref 4.0–10.5)

## 2018-01-11 LAB — HEMOGLOBIN A1C: Hgb A1c MFr Bld: 6.1 % (ref 4.6–6.5)

## 2018-01-11 NOTE — Assessment & Plan Note (Addendum)
Prediabetes: Check a A1c HTN: Seems controlled on amlodipine, metoprolol.  Check a BMP and CBC CAD: Denies chest pain, controlling CV RF, on aspirin; in need of a surgical procedure, refer to cardiology for clearance. Infrarenal abdominal aortic aneurysm: Last visualized 2017. Anxiety: Rarely takes diazepam, needs a contract. Knee pain: pt reports pain is severe and feels she needs to proceed w/ an arthroscopy.  Will refer to cardiology for clearance. Osteoporosis: Tolerating Fosamax without apparent problems Leg swelling: For the last 2 months, I am somewhat concerned about a DVT, check ultrasound. Dementia: Gradual worsening of memory per patient's daughter, I did not do a formal mental exam today, increase Aricept to 10 mg. RTC 4 months.

## 2018-01-23 ENCOUNTER — Ambulatory Visit: Payer: Medicare Other | Admitting: Cardiology

## 2018-01-23 ENCOUNTER — Encounter: Payer: Self-pay | Admitting: *Deleted

## 2018-01-23 ENCOUNTER — Encounter: Payer: Self-pay | Admitting: Cardiology

## 2018-01-23 ENCOUNTER — Other Ambulatory Visit: Payer: Self-pay | Admitting: Internal Medicine

## 2018-01-23 VITALS — BP 172/88 | HR 63 | Ht 65.0 in | Wt 144.8 lb

## 2018-01-23 DIAGNOSIS — I2510 Atherosclerotic heart disease of native coronary artery without angina pectoris: Secondary | ICD-10-CM | POA: Diagnosis not present

## 2018-01-23 DIAGNOSIS — R011 Cardiac murmur, unspecified: Secondary | ICD-10-CM

## 2018-01-23 DIAGNOSIS — Z01810 Encounter for preprocedural cardiovascular examination: Secondary | ICD-10-CM

## 2018-01-23 DIAGNOSIS — I739 Peripheral vascular disease, unspecified: Secondary | ICD-10-CM | POA: Diagnosis not present

## 2018-01-23 DIAGNOSIS — M25561 Pain in right knee: Secondary | ICD-10-CM

## 2018-01-23 DIAGNOSIS — I700 Atherosclerosis of aorta: Secondary | ICD-10-CM

## 2018-01-23 DIAGNOSIS — I2584 Coronary atherosclerosis due to calcified coronary lesion: Secondary | ICD-10-CM

## 2018-01-23 MED ORDER — GABAPENTIN 300 MG PO CAPS: ORAL_CAPSULE | ORAL | 1 refills | Status: DC

## 2018-01-23 NOTE — Progress Notes (Signed)
Cardiology Office Note:    Date:  01/23/2018   ID:  RADHIKA DERSHEM, DOB 1940-06-01, MRN [id number]  PCP:  Wanda Plump, MD  Cardiologist:  No primary care provider on file.  Electrophysiologist:  None   Referring MD: Wanda Plump, MD     History of Present Illness:    EVENY ANASTAS is a 77 y.o. female here for preoperative cardiovascular evaluation for knee arthroscopic surgery, Dr. Allie Bossier.  Former patient of Dr. Yevonne Pax.  Has a history of coronary artery disease with cardiac catheterization 1994 showing mild three-vessel disease nonobstructive and normal Cardiolite stress test in 2009.  She does have aortic atherosclerosis as well as peripheral vascular disease and had left carotid endarterectomy with Dr. Arbie Cookey.  This was done in 2013.  She has been demonstrating knee pain.  Lower extremity ultrasound showed no evidence of DVT.  Chest CT performed in March 2019 shows dense coronary artery calcifications.  Dilated pulmonary artery also noted.  Aorta plaque noted.  It is challenging for her to achieve 4 METS of activity.  She is walking very slowly with her walker currently.  She quit smoking 2 years ago.  Excellent.  She is not complaining of any significant chest discomfort.  She does have baseline shortness of breath with activity.  No fevers chills nausea vomiting syncope.  No bleeding problems.  Her right knee is hurting her significantly.  Past Medical History:  Diagnosis Date  . Anxiety   . Arthritis   . Bronchitis    hx of  . Carotid artery occlusion   . Chronic pain syndrome    pain management  . Coronary artery disease    sees Dr. Patty Sermons  . GERD (gastroesophageal reflux disease)    hx of  . H/O hiatal hernia   . Hypercholesteremia   . Hypertension    all managed by Dr. Haynes Dage bill  . Mitral valve regurgitation    trace  . Non-traumatic compression fracture of thoracic vertebra (HCC)    T9  . Pneumonia 05/27/2017  . Stroke [facility])     Past  Surgical History:  Procedure Laterality Date  . BACK SURGERY     x 3 Dr Juliene Pina, last @ [facility] ~ 2005, rods, fusion, four surgery  . CARDIAC CATHETERIZATION  1994  . CAROTID ENDARTERECTOMY    . CARPAL TUNNEL RELEASE     left  . CHOLECYSTECTOMY    . ENDARTERECTOMY  04/24/2012   Procedure: ENDARTERECTOMY CAROTID;  Surgeon: Larina Earthly, MD;  Location: [facility] OR;  Service: Vascular;  Laterality: Left;  . HAND SURGERY     bilateral  . HIP FRACTURE SURGERY Left   . HIP PINNING,CANNULATED Left 02/08/2015   Procedure: CANNULATED HIP PINNING;  Surgeon: Kathryne Hitch, MD;  Location: [facility] ORS;  Service: Orthopedics;  Laterality: Left;  . HIP PINNING,CANNULATED Right 03/31/2016   Procedure: CANNULATED HIP PINNING;  Surgeon: Kathryne Hitch, MD;  Location: [facility] ORS;  Service: Orthopedics;  Laterality: Right;  . HYSTEROTOMY    . SPINAL CORD STIMULATOR INSERTION    . TEE WITHOUT CARDIOVERSION  04/12/2012   Procedure: TRANSESOPHAGEAL ECHOCARDIOGRAM (TEE);  Surgeon: Lewayne Bunting, MD;  Location: [facility] ENDOSCOPY;  Service: Cardiovascular;  Laterality: N/A;    Current Medications: Current Meds  Medication Sig  . alendronate (FOSAMAX) 70 MG tablet Take 1 tablet (70 mg total) by mouth once a week. Take with a full glass of water on an empty stomach.  Marland Kitchen amLODipine (NORVASC) 10  MG tablet Take 1 tablet (10 mg total) by mouth daily.  Marland Kitchen aspirin EC 81 MG tablet Take 81 mg by mouth daily.  Marland Kitchen atorvastatin (LIPITOR) 40 MG tablet Take 0.5 tablets (20 mg total) by mouth daily.  . diazepam (VALIUM) 5 MG tablet TAKE 1 TABLET BY MOUTH EVERY DAY AS NEEDED  . donepezil (ARICEPT) 10 MG tablet Take 1 tablet (10 mg total) by mouth at bedtime.  Marland Kitchen escitalopram (LEXAPRO) 10 MG tablet Take 10 mg by mouth daily.   Marland Kitchen gabapentin (NEURONTIN) 300 MG capsule Take 900 mg by mouth at bedtime.  . gabapentin (NEURONTIN) 600 MG tablet   . HYSINGLA ER 40 MG T24A TK 1 T PO QD  . metoprolol tartrate (LOPRESSOR) 50 MG tablet  Take 0.5 tablets (25 mg total) by mouth 2 (two) times daily.  Marland Kitchen omeprazole (PRILOSEC) 20 MG capsule Take 1 capsule (20 mg total) by mouth daily.  Marland Kitchen oxyCODONE-acetaminophen (PERCOCET) 7.5-325 MG tablet Take 1 tablet by mouth every 8 (eight) hours as needed for moderate pain or severe pain.  . vitamin B-12 (CYANOCOBALAMIN) 1000 MCG tablet Take 1,000 mcg by mouth daily.     Allergies:   Morphine and related; Percocet [oxycodone-acetaminophen]; Hydrochlorothiazide; Indomethacin; Paroxetine hcl; Penicillins; Pravachol; Quinine derivatives; Wellbutrin [bupropion]; and Zocor [simvastatin - high dose]   Social History   Socioeconomic History  . Marital status: Married    Spouse name: Not on file  . Number of children: 4  . Years of education: Not on file  . Highest education level: Not on file  Occupational History  . Occupation: retired   Engineer, production  . Financial resource strain: Not on file  . Food insecurity:    Worry: Not on file    Inability: Not on file  . Transportation needs:    Medical: Not on file    Non-medical: Not on file  Tobacco Use  . Smoking status: Former Smoker    Packs/day: 0.50    Years: 60.00    Pack years: 30.00    Types: Cigarettes  . Smokeless tobacco: Current User  . Tobacco comment: quit 04-2015  Substance and Sexual Activity  . Alcohol use: No    Alcohol/week: 0.0 standard drinks  . Drug use: No  . Sexual activity: Not Currently  Lifestyle  . Physical activity:    Days per week: Not on file    Minutes per session: Not on file  . Stress: Not on file  Relationships  . Social connections:    Talks on phone: Not on file    Gets together: Not on file    Attends religious service: Not on file    Active member of club or organization: Not on file    Attends meetings of clubs or organizations: Not on file    Relationship status: Not on file  Other Topics Concern  . Not on file  Social History Narrative   Lives w/ husband in a one story home.      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   Education: high school     Family History: The patient's family history includes Cancer in her sister; Heart attack in her father; Heart disease in her father, sister, and sister.  ROS:   Please see the history of present illness.     All other systems reviewed and are negative.  EKGs/Labs/Other Studies Reviewed:    The following studies were reviewed today:  Lower extremity ultrasound no DVT-01/11/2018  ECHO 04/12/12:  - Left ventricle: Hypertrophy was noted. Systolic function was normal. The estimated ejection fraction was in the range of 60% to 65%. Wall motion was normal; there were no regional wall motion abnormalities. - Mitral valve: Mild regurgitation. - Left atrium: The atrium was mildly dilated. No evidence of thrombus in the atrial cavity or appendage. - Atrial septum: No defect or patent foramen ovale was identified. - Tricuspid valve: No evidence of vegetation. - Pulmonic valve: No evidence of vegetation. Impressions:  EKG: 07/12/2017- sinus rhythm, borderline prolonged PR, QTc 496 personally reviewed and interpreted.  EKG 01/23/2018-sinus rhythm 63 nonspecific T wave changes, QTC 450.  Personally reviewed and interpreted.  Recent Labs: 05/20/2017: TSH 0.98 07/13/2017: ALT 19 01/10/2018: BUN 26; Creatinine, Ser 1.23; Hemoglobin 12.3; Platelets 301.0; Potassium 4.5; Sodium 141  Recent Lipid Panel    Component Value Date/Time   CHOL 127 03/30/2017 1457   TRIG 141.0 03/30/2017 1457   HDL 46.20 03/30/2017 1457   CHOLHDL 3 03/30/2017 1457   VLDL 28.2 03/30/2017 1457   LDLCALC 52 03/30/2017 1457   LDLDIRECT 70.0 05/23/2014 1129    Physical Exam:    VS:  BP (!) 172/88   Pulse 63   Ht 5\' 5"  (1.651 m)   Wt 144 lb 12.8 oz (65.7 kg)   BMI 24.10 kg/m     Wt Readings from Last 3 Encounters:  01/23/18 144 lb 12.8 oz (65.7 kg)  01/10/18 145 lb 4 oz (65.9 kg)  09/26/17 141 lb (64 kg)      GEN: Utilizing a walker, slow shuffling gait, well nourished, well developed in no acute distress HEENT: Normal NECK: No JVD; No carotid bruits LYMPHATICS: No lymphadenopathy CARDIAC: RRR, 2/6 systolic apical murmur, no rubs, gallops RESPIRATORY:  Clear to auscultation without rales, wheezing or rhonchi  ABDOMEN: Soft, non-tender, non-distended MUSCULOSKELETAL:  No edema; No deformity  SKIN: Warm and dry NEUROLOGIC:  Alert and oriented x 3 PSYCHIATRIC:  Normal affect   ASSESSMENT:    1. Pre-operative cardiovascular examination   2. Murmur, cardiac   3. Coronary artery disease involving native coronary artery of native heart without angina pectoris   4. PVD (peripheral vascular disease) (HCC)   5. Aortic atherosclerosis (HCC)   6. Coronary artery calcification   7. Acute pain of right knee    PLAN:    In order of problems listed above:  Preoperative cardiac risk stratification prior to knee arthroscopic surgery - An extensive review of data, she does have dense coronary artery calcifications noted on CT scan as well as diffuse aortic atherosclerosis and known peripheral vascular disease status post left carotid endarterectomy.  Given her inability to achieve greater than 4 METS of activity, we will proceed with pharmacologic stress test for further risk stratification prior to general anesthesia.  If high risk, we will proceed with cardiac catheterization. - We will notify Dr. Allie Bossier with results.  Heart murmur -She had previous mild mitral regurgitation.  She also has a dilated pulmonary artery.  I will check an echocardiogram.  Former smoker -Several years of smoking.  Quit.  Excellent.  Peripheral vascular disease -Status post left carotid endarterectomy.  Extensive coronary calcification noted.  Extensive aortic atherosclerosis as well.  Continue with atorvastatin.   Medication Adjustments/Labs and Tests Ordered: Current medicines are reviewed at length with  the patient today.  Concerns regarding medicines are outlined above.  Orders Placed This Encounter  Procedures  . MYOCARDIAL PERFUSION IMAGING  .  EKG 12-Lead  . ECHOCARDIOGRAM COMPLETE   No orders of the defined types were placed in this encounter.   Patient Instructions  Medication Instructions:  The current medical regimen is effective;  continue present plan and medications.  Testing/Procedures: Your physician has requested that you have an echocardiogram. Echocardiography is a painless test that uses sound waves to create images of your heart. It provides your doctor with information about the size and shape of your heart and how well your heart's chambers and valves are working. This procedure takes approximately one hour. There are no restrictions for this procedure.  Your physician has requested that you have a lexiscan myoview. For further information please visit [email or web address] Please follow instruction sheet, as given.  Follow-Up: Follow up in 1 year with Dr. Anne Fu.  You will receive a letter in the mail 2 months before you are due.  Please call us when you receive this letter to schedule your follow up appointment.  If you need a refill on your cardiac medications before your next appointment, please call your pharmacy.  Thank you for choosing [facility]!!        Signed, Donato Schultz, MD  01/23/2018 12:23 PM    [facility]

## 2018-01-23 NOTE — Patient Instructions (Signed)
Medication Instructions:  The current medical regimen is effective;  continue present plan and medications.  Testing/Procedures: Your physician has requested that you have an echocardiogram. Echocardiography is a painless test that uses sound waves to create images of your heart. It provides your doctor with information about the size and shape of your heart and how well your heart's chambers and valves are working. This procedure takes approximately one hour. There are no restrictions for this procedure.  Your physician has requested that you have a lexiscan myoview. For further information please visit [email or web address]. Please follow instruction sheet, as given.  Follow-Up: Follow up in 1 year with Dr. Skains.  You will receive a letter in the mail 2 months before you are due.  Please call us when you receive this letter to schedule your follow up appointment.  If you need a refill on your cardiac medications before your next appointment, please call your pharmacy.  Thank you for choosing [facility]!!       

## 2018-01-23 NOTE — Telephone Encounter (Signed)
Refill for Gabapentin- how should I refill- she is on 300mg  and 600mg .

## 2018-01-23 NOTE — Telephone Encounter (Signed)
Spoke with the patient and clarified the gabapentin dose. She has gabapentin 300 mg: 1 tablet in the morning, 2 tablets at bedtime.  Send the prescription with that sig

## 2018-01-23 NOTE — Telephone Encounter (Signed)
Rx sent. Gabapentin 600mg  removed from med list.

## 2018-01-25 ENCOUNTER — Other Ambulatory Visit: Payer: Self-pay

## 2018-01-25 DIAGNOSIS — I6523 Occlusion and stenosis of bilateral carotid arteries: Secondary | ICD-10-CM

## 2018-01-26 ENCOUNTER — Telehealth (HOSPITAL_COMMUNITY): Payer: Self-pay | Admitting: *Deleted

## 2018-01-26 NOTE — Telephone Encounter (Signed)
Patient given detailed instructions per Myocardial Perfusion Study Information Sheet for the test on 01/30/18 Patient notified to arrive 15 minutes early and that it is imperative to arrive on time for appointment to keep from having the test rescheduled.  If you need to cancel or reschedule your appointment, please call the office within 24 hours of your appointment. . Patient verbalized understanding. Left message on voicemail per DPR in reference to upcoming appointment scheduled on 01/30/18 with detailed instructions given per Myocardial Perfusion Study Information Sheet for the test. LM to arrive 15 minutes early, and that it is imperative to arrive on time for appointment to keep from having the test rescheduled. If you need to cancel or reschedule your appointment, please call the office within 24 hours of your appointment. Failure to do so may result in a cancellation of your appointment, and a $50 no show fee. Phone number given for call back for any questions. Sara [name]

## 2018-01-30 ENCOUNTER — Ambulatory Visit (HOSPITAL_COMMUNITY): Payer: Medicare Other

## 2018-01-30 ENCOUNTER — Ambulatory Visit (HOSPITAL_COMMUNITY): Payer: Medicare Other | Attending: Cardiovascular Disease

## 2018-01-30 DIAGNOSIS — Z01810 Encounter for preprocedural cardiovascular examination: Secondary | ICD-10-CM | POA: Insufficient documentation

## 2018-01-30 DIAGNOSIS — R011 Cardiac murmur, unspecified: Secondary | ICD-10-CM | POA: Diagnosis not present

## 2018-01-30 DIAGNOSIS — I2510 Atherosclerotic heart disease of native coronary artery without angina pectoris: Secondary | ICD-10-CM

## 2018-01-30 LAB — MYOCARDIAL PERFUSION IMAGING
CHL CUP NUCLEAR SDS: 2
CHL CUP NUCLEAR SRS: 2
CSEPPHR: 77 {beats}/min
LV dias vol: 65 mL (ref 46–106)
LVSYSVOL: 24 mL
NUC STRESS TID: 0.96
Rest HR: 57 {beats}/min
SSS: 4

## 2018-01-30 MED ORDER — TECHNETIUM TC 99M TETROFOSMIN IV KIT
32.7000 | PACK | Freq: Once | INTRAVENOUS | Status: AC | PRN
  Administered 2018-01-30: 32.7 via INTRAVENOUS
  Filled 2018-01-30: qty 33

## 2018-01-30 MED ORDER — TECHNETIUM TC 99M TETROFOSMIN IV KIT
10.1000 | PACK | Freq: Once | INTRAVENOUS | Status: AC | PRN
  Administered 2018-01-30: 10.1 via INTRAVENOUS
  Filled 2018-01-30: qty 11

## 2018-01-30 MED ORDER — REGADENOSON 0.4 MG/5ML IV SOLN
0.4000 mg | Freq: Once | INTRAVENOUS | Status: AC
  Administered 2018-01-30: 0.4 mg via INTRAVENOUS

## 2018-02-01 ENCOUNTER — Ambulatory Visit (HOSPITAL_COMMUNITY): Payer: Medicare Other | Attending: Cardiovascular Disease

## 2018-02-01 ENCOUNTER — Other Ambulatory Visit: Payer: Self-pay

## 2018-02-01 DIAGNOSIS — Z01810 Encounter for preprocedural cardiovascular examination: Secondary | ICD-10-CM | POA: Insufficient documentation

## 2018-02-01 DIAGNOSIS — I2510 Atherosclerotic heart disease of native coronary artery without angina pectoris: Secondary | ICD-10-CM

## 2018-02-01 DIAGNOSIS — R011 Cardiac murmur, unspecified: Secondary | ICD-10-CM | POA: Insufficient documentation

## 2018-02-13 ENCOUNTER — Other Ambulatory Visit: Payer: Self-pay

## 2018-02-13 ENCOUNTER — Encounter (HOSPITAL_COMMUNITY): Payer: Self-pay | Admitting: *Deleted

## 2018-02-13 ENCOUNTER — Other Ambulatory Visit (INDEPENDENT_AMBULATORY_CARE_PROVIDER_SITE_OTHER): Payer: Self-pay | Admitting: Orthopaedic Surgery

## 2018-02-13 DIAGNOSIS — M25461 Effusion, right knee: Secondary | ICD-10-CM

## 2018-02-13 NOTE — Progress Notes (Signed)
Pt denies SOB and chest pain. Pt stated that she is under the care of Dr. Anne Fu, Cardiology; clearance in Epic (see stress and echo results). Pt made aware to stop taking vitamins, fish oil and herbal medications. Do not take any NSAIDs ie: Ibuprofen, Advil, Naproxen (Aleve), Motrin, BC and Goody Powder. Pt denies recent labs. Pt verbalized understanding of all pre-op instructions.

## 2018-02-14 ENCOUNTER — Ambulatory Visit (HOSPITAL_COMMUNITY)
Admission: RE | Admit: 2018-02-14 | Discharge: 2018-02-14 | Disposition: A | Discharge status: Home / Self Care | Payer: Medicare Other | Source: Ambulatory Visit | Attending: Orthopaedic Surgery | Admitting: Orthopaedic Surgery

## 2018-02-14 ENCOUNTER — Encounter (HOSPITAL_COMMUNITY)
Admission: RE | Disposition: A | Discharge status: Home / Self Care | Payer: Self-pay | Source: Ambulatory Visit | Attending: Orthopaedic Surgery

## 2018-02-14 ENCOUNTER — Ambulatory Visit (HOSPITAL_COMMUNITY): Payer: Medicare Other | Admitting: Anesthesiology

## 2018-02-14 ENCOUNTER — Encounter (HOSPITAL_COMMUNITY): Payer: Self-pay | Admitting: *Deleted

## 2018-02-14 DIAGNOSIS — K219 Gastro-esophageal reflux disease without esophagitis: Secondary | ICD-10-CM | POA: Diagnosis not present

## 2018-02-14 DIAGNOSIS — Z79899 Other long term (current) drug therapy: Secondary | ICD-10-CM | POA: Diagnosis not present

## 2018-02-14 DIAGNOSIS — Z8673 Personal history of transient ischemic attack (TIA), and cerebral infarction without residual deficits: Secondary | ICD-10-CM | POA: Diagnosis not present

## 2018-02-14 DIAGNOSIS — G894 Chronic pain syndrome: Secondary | ICD-10-CM | POA: Insufficient documentation

## 2018-02-14 DIAGNOSIS — Z880 Allergy status to penicillin: Secondary | ICD-10-CM | POA: Diagnosis not present

## 2018-02-14 DIAGNOSIS — M65861 Other synovitis and tenosynovitis, right lower leg: Secondary | ICD-10-CM | POA: Diagnosis not present

## 2018-02-14 DIAGNOSIS — Z87891 Personal history of nicotine dependence: Secondary | ICD-10-CM | POA: Diagnosis not present

## 2018-02-14 DIAGNOSIS — M25461 Effusion, right knee: Secondary | ICD-10-CM | POA: Diagnosis present

## 2018-02-14 DIAGNOSIS — F329 Major depressive disorder, single episode, unspecified: Secondary | ICD-10-CM | POA: Insufficient documentation

## 2018-02-14 DIAGNOSIS — E7800 Pure hypercholesterolemia, unspecified: Secondary | ICD-10-CM | POA: Diagnosis not present

## 2018-02-14 DIAGNOSIS — M94261 Chondromalacia, right knee: Secondary | ICD-10-CM | POA: Diagnosis not present

## 2018-02-14 DIAGNOSIS — Z9689 Presence of other specified functional implants: Secondary | ICD-10-CM | POA: Diagnosis not present

## 2018-02-14 DIAGNOSIS — Z7982 Long term (current) use of aspirin: Secondary | ICD-10-CM | POA: Diagnosis not present

## 2018-02-14 DIAGNOSIS — M1990 Unspecified osteoarthritis, unspecified site: Secondary | ICD-10-CM | POA: Insufficient documentation

## 2018-02-14 DIAGNOSIS — Z885 Allergy status to narcotic agent status: Secondary | ICD-10-CM | POA: Insufficient documentation

## 2018-02-14 DIAGNOSIS — M23221 Derangement of posterior horn of medial meniscus due to old tear or injury, right knee: Secondary | ICD-10-CM | POA: Insufficient documentation

## 2018-02-14 DIAGNOSIS — I739 Peripheral vascular disease, unspecified: Secondary | ICD-10-CM | POA: Insufficient documentation

## 2018-02-14 DIAGNOSIS — Z888 Allergy status to other drugs, medicaments and biological substances status: Secondary | ICD-10-CM | POA: Diagnosis not present

## 2018-02-14 DIAGNOSIS — F0390 Unspecified dementia without behavioral disturbance: Secondary | ICD-10-CM | POA: Insufficient documentation

## 2018-02-14 DIAGNOSIS — Z8249 Family history of ischemic heart disease and other diseases of the circulatory system: Secondary | ICD-10-CM | POA: Diagnosis not present

## 2018-02-14 DIAGNOSIS — I2510 Atherosclerotic heart disease of native coronary artery without angina pectoris: Secondary | ICD-10-CM | POA: Diagnosis not present

## 2018-02-14 DIAGNOSIS — F419 Anxiety disorder, unspecified: Secondary | ICD-10-CM | POA: Diagnosis not present

## 2018-02-14 DIAGNOSIS — I10 Essential (primary) hypertension: Secondary | ICD-10-CM | POA: Diagnosis not present

## 2018-02-14 DIAGNOSIS — S83241S Other tear of medial meniscus, current injury, right knee, sequela: Secondary | ICD-10-CM | POA: Diagnosis not present

## 2018-02-14 HISTORY — DX: Presence of spectacles and contact lenses: Z97.3

## 2018-02-14 HISTORY — DX: Presence of dental prosthetic device (complete) (partial): Z97.2

## 2018-02-14 HISTORY — DX: Unspecified dementia, unspecified severity, without behavioral disturbance, psychotic disturbance, mood disturbance, and anxiety: F03.90

## 2018-02-14 HISTORY — PX: KNEE ARTHROSCOPY: SHX127

## 2018-02-14 LAB — CBC
HCT: 40.6 % (ref 36.0–46.0)
HEMOGLOBIN: 12.5 g/dL (ref 12.0–15.0)
MCH: 29.2 pg (ref 26.0–34.0)
MCHC: 30.8 g/dL (ref 30.0–36.0)
MCV: 94.9 fL (ref 80.0–100.0)
PLATELETS: 302 10*3/uL (ref 150–400)
RBC: 4.28 MIL/uL (ref 3.87–5.11)
RDW: 13.1 % (ref 11.5–15.5)
WBC: 9.3 10*3/uL (ref 4.0–10.5)
nRBC: 0 % (ref 0.0–0.2)

## 2018-02-14 LAB — BASIC METABOLIC PANEL
ANION GAP: 10 (ref 5–15)
BUN: 20 mg/dL (ref 8–23)
CALCIUM: 8.9 mg/dL (ref 8.9–10.3)
CO2: 23 mmol/L (ref 22–32)
Chloride: 108 mmol/L (ref 98–111)
Creatinine, Ser: 1.22 mg/dL — ABNORMAL HIGH (ref 0.44–1.00)
GFR, EST AFRICAN AMERICAN: 48 mL/min — AB (ref >60)
GFR, EST NON AFRICAN AMERICAN: 41 mL/min — AB (ref >60)
Glucose, Bld: 101 mg/dL — ABNORMAL HIGH (ref 70–99)
POTASSIUM: 4.2 mmol/L (ref 3.5–5.1)
SODIUM: 141 mmol/L (ref 135–145)

## 2018-02-14 SURGERY — ARTHROSCOPY, KNEE
Anesthesia: General | Laterality: Right

## 2018-02-14 MED ORDER — SODIUM CHLORIDE 0.9 % IR SOLN
Status: DC | PRN
  Administered 2018-02-14 (×2): 3000 mL

## 2018-02-14 MED ORDER — DEXAMETHASONE SODIUM PHOSPHATE 10 MG/ML IJ SOLN
INTRAMUSCULAR | Status: AC
  Filled 2018-02-14: qty 1

## 2018-02-14 MED ORDER — FENTANYL CITRATE (PF) 250 MCG/5ML IJ SOLN
INTRAMUSCULAR | Status: DC | PRN
  Administered 2018-02-14: 75 ug via INTRAVENOUS
  Administered 2018-02-14 (×2): 25 ug via INTRAVENOUS

## 2018-02-14 MED ORDER — STERILE WATER FOR IRRIGATION IR SOLN
Status: DC | PRN
  Administered 2018-02-14: 1000 mL

## 2018-02-14 MED ORDER — PROPOFOL 10 MG/ML IV BOLUS
INTRAVENOUS | Status: DC | PRN
  Administered 2018-02-14: 100 mg via INTRAVENOUS

## 2018-02-14 MED ORDER — BUPIVACAINE HCL (PF) 0.25 % IJ SOLN
INTRAMUSCULAR | Status: DC | PRN
  Administered 2018-02-14: 30 mL

## 2018-02-14 MED ORDER — FENTANYL CITRATE (PF) 100 MCG/2ML IJ SOLN: 25.0000 ug | INTRAMUSCULAR | Status: DC | PRN

## 2018-02-14 MED ORDER — ONDANSETRON HCL 4 MG/2ML IJ SOLN
INTRAMUSCULAR | Status: DC | PRN
  Administered 2018-02-14: 4 mg via INTRAVENOUS

## 2018-02-14 MED ORDER — ONDANSETRON HCL 4 MG/2ML IJ SOLN
INTRAMUSCULAR | Status: AC
  Filled 2018-02-14: qty 2

## 2018-02-14 MED ORDER — OXYCODONE-ACETAMINOPHEN 7.5-325 MG PO TABS: 1.0000 | ORAL_TABLET | Freq: Four times a day (QID) | ORAL | 0 refills | Status: DC | PRN

## 2018-02-14 MED ORDER — FENTANYL CITRATE (PF) 250 MCG/5ML IJ SOLN
INTRAMUSCULAR | Status: AC
  Filled 2018-02-14: qty 5

## 2018-02-14 MED ORDER — PROPOFOL 10 MG/ML IV BOLUS
INTRAVENOUS | Status: AC
  Filled 2018-02-14: qty 20

## 2018-02-14 MED ORDER — DIPHENHYDRAMINE HCL 50 MG/ML IJ SOLN
INTRAMUSCULAR | Status: DC | PRN
  Administered 2018-02-14: 12.5 mg via INTRAVENOUS

## 2018-02-14 MED ORDER — PROMETHAZINE HCL 25 MG/ML IJ SOLN: 6.2500 mg | INTRAMUSCULAR | Status: DC | PRN

## 2018-02-14 MED ORDER — DEXAMETHASONE SODIUM PHOSPHATE 10 MG/ML IJ SOLN
INTRAMUSCULAR | Status: DC | PRN
  Administered 2018-02-14: 10 mg via INTRAVENOUS

## 2018-02-14 MED ORDER — DIPHENHYDRAMINE HCL 50 MG/ML IJ SOLN
INTRAMUSCULAR | Status: AC
  Filled 2018-02-14: qty 1

## 2018-02-14 MED ORDER — CLINDAMYCIN PHOSPHATE 900 MG/50ML IV SOLN
900.0000 mg | INTRAVENOUS | Status: AC
  Administered 2018-02-14: 900 mg via INTRAVENOUS
  Filled 2018-02-14: qty 50

## 2018-02-14 MED ORDER — EPHEDRINE SULFATE-NACL 50-0.9 MG/10ML-% IV SOSY
PREFILLED_SYRINGE | INTRAVENOUS | Status: DC | PRN
  Administered 2018-02-14: 15 mg via INTRAVENOUS

## 2018-02-14 MED ORDER — LACTATED RINGERS IV SOLN
INTRAVENOUS | Status: DC
  Administered 2018-02-14: 12:00:00 via INTRAVENOUS

## 2018-02-14 MED ORDER — EPHEDRINE 5 MG/ML INJ
INTRAVENOUS | Status: AC
  Filled 2018-02-14: qty 10

## 2018-02-14 MED ORDER — BUPIVACAINE HCL (PF) 0.25 % IJ SOLN
INTRAMUSCULAR | Status: AC
  Filled 2018-02-14: qty 30

## 2018-02-14 SURGICAL SUPPLY — 34 items
BANDAGE ACE 6X5 VEL STRL LF (GAUZE/BANDAGES/DRESSINGS) ×3 IMPLANT
BLADE CLIPPER SURG (BLADE) IMPLANT
BLADE CUTTER GATOR 3.5 (BLADE) ×3 IMPLANT
BNDG ELASTIC 6X10 VLCR STRL LF (GAUZE/BANDAGES/DRESSINGS) ×3 IMPLANT
COVER WAND RF STERILE (DRAPES) ×3 IMPLANT
DRAPE ARTHROSCOPY W/POUCH 114 (DRAPES) ×3 IMPLANT
DRAPE U-SHAPE 47X51 STRL (DRAPES) ×3 IMPLANT
DRSG PAD ABDOMINAL 8X10 ST (GAUZE/BANDAGES/DRESSINGS) ×3 IMPLANT
DURAPREP 26ML APPLICATOR (WOUND CARE) ×3 IMPLANT
GAUZE SPONGE 4X4 12PLY STRL (GAUZE/BANDAGES/DRESSINGS) ×3 IMPLANT
GAUZE SPONGE 4X4 12PLY STRL LF (GAUZE/BANDAGES/DRESSINGS) ×3 IMPLANT
GAUZE XEROFORM 1X8 LF (GAUZE/BANDAGES/DRESSINGS) ×3 IMPLANT
GLOVE BIOGEL PI IND STRL 8 (GLOVE) ×2 IMPLANT
GLOVE BIOGEL PI INDICATOR 8 (GLOVE) ×4
GLOVE ORTHO TXT STRL SZ7.5 (GLOVE) ×3 IMPLANT
GLOVE SURG ORTHO 8.0 STRL STRW (GLOVE) ×3 IMPLANT
GOWN STRL REUS W/ TWL LRG LVL3 (GOWN DISPOSABLE) ×1 IMPLANT
GOWN STRL REUS W/ TWL XL LVL3 (GOWN DISPOSABLE) ×2 IMPLANT
GOWN STRL REUS W/TWL LRG LVL3 (GOWN DISPOSABLE) ×2
GOWN STRL REUS W/TWL XL LVL3 (GOWN DISPOSABLE) ×4
KIT TURNOVER KIT B (KITS) ×3 IMPLANT
MANIFOLD NEPTUNE II (INSTRUMENTS) ×3 IMPLANT
PACK ARTHROSCOPY DSU (CUSTOM PROCEDURE TRAY) ×3 IMPLANT
PAD ABD 8X10 STRL (GAUZE/BANDAGES/DRESSINGS) ×3 IMPLANT
PAD ARMBOARD 7.5X6 YLW CONV (MISCELLANEOUS) ×3 IMPLANT
PADDING CAST COTTON 6X4 STRL (CAST SUPPLIES) ×3 IMPLANT
SET ARTHROSCOPY TUBING (MISCELLANEOUS)
SET ARTHROSCOPY TUBING LN (MISCELLANEOUS) IMPLANT
SPONGE LAP 4X18 RFD (DISPOSABLE) ×3 IMPLANT
SUT ETHILON 3 0 PS 1 (SUTURE) ×3 IMPLANT
TOWEL OR 17X24 6PK STRL BLUE (TOWEL DISPOSABLE) IMPLANT
TOWEL OR 17X26 10 PK STRL BLUE (TOWEL DISPOSABLE) ×3 IMPLANT
WAND HAND CNTRL MULTIVAC 90 (MISCELLANEOUS) IMPLANT
WATER STERILE IRR 1000ML POUR (IV SOLUTION) ×3 IMPLANT

## 2018-02-14 NOTE — Transfer of Care (Signed)
Immediate Anesthesia Transfer of Care Note  Patient: Sara [name]  Procedure(s) Performed: RIGHT KNEE ARTHROSCOPY WITH POSSIBLE PARTIAL MENISCECTOMY (Right )  Patient Location: PACU  Anesthesia Type:General  Level of Consciousness: awake, alert  and oriented  Airway & Oxygen Therapy: Patient Spontanous Breathing  Post-op Assessment: Report given to RN  Post vital signs: Reviewed and stable  Last Vitals:  Vitals Value Taken Time  BP 152/72 02/14/2018  2:23 PM  Temp    Pulse 90 02/14/2018  2:23 PM  Resp 9 02/14/2018  2:23 PM  SpO2 100 % 02/14/2018  2:23 PM  Vitals shown include unvalidated device data.  Last Pain:  Vitals:   02/14/18 1211  TempSrc:   PainSc: 5       Patients Stated Pain Goal: 3 (02/14/18 1211)  Complications: No apparent anesthesia complications

## 2018-02-14 NOTE — Discharge Instructions (Signed)
You may put all of your weight on your right leg as comfort allows. Increase your activities as comfort allows. Expect swelling - ice and elevation as needed. You can remove your dressings in 24 hours and get your incisions wet daily in the shower. You can place small band-aids over your incisions daily.

## 2018-02-14 NOTE — Op Note (Signed)
NAME: Sara [name], Sara [name] MEDICAL RECORD ZO:[id number] ACCOUNT [id number] DATE OF BIRTH:02-15-1941 FACILITY: [facility] LOCATION: [facility] PHYSICIAN:Jayci Ellefson Aretha Parrot, MD  OPERATIVE REPORT  DATE OF PROCEDURE:  02/14/2018  PREOPERATIVE DIAGNOSIS:  Right knee with recurrent effusions and suspected medial meniscal tear with synovitis.  POSTOPERATIVE DIAGNOSIS:  Right knee synovitis with large posterior horn to midbody medial meniscal tear.  PROCEDURE:  Right knee arthroscopy with debridement, partial synovectomy and partial medial meniscectomy.  SURGEON:  Vanita Panda. Magnus Ivan, MD  ASSISTANT:  Richardean Canal, PA-C.  ANESTHESIA: 1.  General. 2.  Local with 0.25% plain Marcaine.  ESTIMATED BLOOD LOSS:  Minimal.  COMPLICATIONS:  None.  INDICATIONS:  The patient is a very active 77 year old female with recurrent effusions involving her right knee.  We have aspirated her knee on multiple occasions and provided steroid injections, but her effusions kept occurring.  Plain films showed  still good joint space in the knee.  She cannot have an MRI because of spinal cord stimulator.  With recurrent effusions, we did obtain a CT scan and it was suspicious for a possible meniscal tear and she had some wearing of the cartilage of her knee,  but nothing making it suspicious enough to recommend a knee replacement.  We at this point recommend a knee arthroscopy with partial synovectomy and assessing the medial compartment of her knee.  We had a long and thorough discussion about the surgery as  well as a discussion of the risks and benefits of the surgery.  DESCRIPTION OF PROCEDURE:  After informed consent was obtained and appropriate right knee was marked, she was brought to the operating room and placed supine on the operating table.  General anesthesia was then obtained.  Her right thigh, knee, leg and  ankle were prepped and draped with DuraPrep, sterile drapes including a sterile stockinette with  the bed raised and a lateral leg post-utilized the right operative knee was flexed off the side of the table.  Timeout was called to identify correct  patient, correct right knee.  I then made an anterolateral arthroscopy portal and inserted a cannula knee and drained a large effusion from the knee.  I then placed the camera in the knee and went to the medial compartment and made an anterior medial  incision.  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  There was a complex medial meniscal tear from the posterior horn to midbody and so we used arthroscopic basket forceps with  biters and arthroscopic shaver to perform partial medial meniscectomy get this back to a stable margin.  There was significant synovitis in the medial compartment of the knee as well as the infrapatellar and suprapatellar space.  We used arthroscopic  shaver to perform a partial synovectomy in all 3 of these areas.  With the knee in a figure-of-four position, the lateral compartment was assessed and found it to be intact with no significant wear of the meniscus or the cartilage.  We then allowed fluid  lavage of the knee and then drained all the fluid from the knee.  We removed all instrumentation and closed the portal sites with interrupted nylon suture.  Xeroform well-padded sterile dressing was applied.  Before the sterile dressing was applied, we  did insert 0.25% Marcaine into the knee joint and arthroscopy portal sites.  She was then awakened, extubated, and taken to recovery room in stable condition.  All final counts were correct.  No complications noted.  Postoperatively, we will allow her to  weightbear as tolerated, increase activities as tolerated.  She will be discharged from the PACU with followup in the office in a week.  TN/NUANCE  D:02/14/2018 T:02/14/2018 JOB:[id number]

## 2018-02-14 NOTE — Anesthesia Postprocedure Evaluation (Signed)
Anesthesia Post Note  Patient: Sara [name]  Procedure(s) Performed: RIGHT KNEE ARTHROSCOPY WITH POSSIBLE PARTIAL MENISCECTOMY (Right )     Patient location during evaluation: PACU Anesthesia Type: General Level of consciousness: sedated Pain management: pain level controlled Vital Signs Assessment: post-procedure vital signs reviewed and stable Respiratory status: spontaneous breathing and respiratory function stable Cardiovascular status: stable Postop Assessment: no apparent nausea or vomiting Anesthetic complications: no    Last Vitals:  Vitals:   02/14/18 1445 02/14/18 1510  BP:  (!) 155/93  Pulse: 80 77  Resp:  13  Temp:  36.5 C  SpO2: 92% 97%    Last Pain:  Vitals:   02/14/18 1445  TempSrc:   PainSc: 0-No pain                 Mahad Newstrom DANIEL

## 2018-02-14 NOTE — Anesthesia Procedure Notes (Signed)
Procedure Name: LMA Insertion Date/Time: 02/14/2018 1:40 PM Performed by: De Nurse, CRNA Pre-anesthesia Checklist: Patient identified, Emergency Drugs available, Suction available and Patient being monitored Patient Re-evaluated:Patient Re-evaluated prior to induction Oxygen Delivery Method: Circle System Utilized Preoxygenation: Pre-oxygenation with 100% oxygen Induction Type: IV induction Ventilation: Mask ventilation without difficulty LMA: LMA inserted LMA Size: 4.0 Number of attempts: 1 Placement Confirmation: positive ETCO2 Tube secured with: Tape Dental Injury: Teeth and Oropharynx as per pre-operative assessment

## 2018-02-14 NOTE — H&P (Signed)
Sara [name] is an 77 y.o. female.   Chief Complaint:  Right knee pain with recurrent effusions HPI:   77 yo female with acute onset of right knee pain.  Has had recurrent effusions as well.  Unable to have a MRI.  Failed conservative treatment at this standpoint.  Wishes to proceed with an arthroscopic intervention to address likely meniscal tear.  Past Medical History:  Diagnosis Date  . Anxiety   . Arthritis   . Bronchitis    hx of  . Carotid artery occlusion   . Chronic pain syndrome    pain management  . Coronary artery disease    sees Dr. Patty Sermons  . Dementia (HCC)   . GERD (gastroesophageal reflux disease)    hx of  . H/O hiatal hernia   . Hypercholesteremia   . Hypertension    all managed by Dr. Haynes Dage bill  . Mitral valve regurgitation    trace  . Non-traumatic compression fracture of thoracic vertebra (HCC)    T9  . Pneumonia 05/27/2017  . Stroke (HCC)   . Wears dentures   . Wears glasses     Past Surgical History:  Procedure Laterality Date  . BACK SURGERY     x 3 Dr Juliene Pina, last @ [facility] ~ 2005, rods, fusion, four surgery  . CARDIAC CATHETERIZATION  1994  . CAROTID ENDARTERECTOMY    . CARPAL TUNNEL RELEASE     left  . CHOLECYSTECTOMY    . ENDARTERECTOMY  04/24/2012   Procedure: ENDARTERECTOMY CAROTID;  Surgeon: Larina Earthly, MD;  Location: [facility] OR;  Service: Vascular;  Laterality: Left;  . HAND SURGERY     bilateral  . HIP FRACTURE SURGERY Left   . HIP PINNING,CANNULATED Left 02/08/2015   Procedure: CANNULATED HIP PINNING;  Surgeon: Kathryne Hitch, MD;  Location: [facility] ORS;  Service: Orthopedics;  Laterality: Left;  . HIP PINNING,CANNULATED Right 03/31/2016   Procedure: CANNULATED HIP PINNING;  Surgeon: Kathryne Hitch, MD;  Location: [facility] ORS;  Service: Orthopedics;  Laterality: Right;  . HYSTEROTOMY    . MULTIPLE TOOTH EXTRACTIONS    . SPINAL CORD STIMULATOR INSERTION    . TEE WITHOUT CARDIOVERSION  04/12/2012   Procedure:  TRANSESOPHAGEAL ECHOCARDIOGRAM (TEE);  Surgeon: Lewayne Bunting, MD;  Location: [facility] ENDOSCOPY;  Service: Cardiovascular;  Laterality: N/A;  . TONSILLECTOMY      Family History  Problem Relation Age of Onset  . Heart attack Father   . Heart disease Father        before age 82  . Heart disease Sister   . Cancer Sister   . Heart disease Sister    Social History:  reports that she has quit smoking. Her smoking use included cigarettes. She has a 30.00 pack-year smoking history. She has never used smokeless tobacco. She reports that she does not drink alcohol or use drugs.  Allergies:  Allergies  Allergen Reactions  . Penicillins Other (See Comments)    Has patient had a PCN reaction causing immediate rash, facial/tongue/throat swelling, SOB or lightheadedness with hypotension: # # # Yes # # # Has patient had a PCN reaction causing severe rash involving mucus membranes or skin necrosis: No Has patient had a PCN reaction that required hospitalization No Has patient had a PCN reaction occurring within the last 10 years: No If all of the above answers are "NO", then may proceed with Cephalosporin use.   Marland Kitchen Morphine And Related Rash  . Percocet [Oxycodone-Acetaminophen] Rash  Tolerates APAP (including IV), morphine, and Hydrocodone/APAP  . Hydrochlorothiazide Other (See Comments)    Pt does not remember  . Indomethacin     Pt does not remember  . Paroxetine Hcl     Pt does not remember  . Pravachol Other (See Comments)    Pt does not remember  . Quinine Derivatives     Pt does not remember  . Wellbutrin [Bupropion]     agitation  . Zocor [Simvastatin - High Dose] Other (See Comments)    Weakness/no energy    Medications Prior to Admission  Medication Sig Dispense Refill  . alendronate (FOSAMAX) 70 MG tablet Take 1 tablet (70 mg total) by mouth once a week. Take with a full glass of water on an empty stomach. (Patient taking differently: Take 70 mg by mouth every Friday. Take with  a full glass of water on an empty stomach.) 4 tablet 12  . amLODipine (NORVASC) 10 MG tablet Take 1 tablet (10 mg total) by mouth daily. 90 tablet 1  . aspirin EC 81 MG tablet Take 81 mg by mouth daily.    Marland Kitchen atorvastatin (LIPITOR) 40 MG tablet Take 0.5 tablets (20 mg total) by mouth daily. 45 tablet 5  . diazepam (VALIUM) 5 MG tablet TAKE 1 TABLET BY MOUTH EVERY DAY AS NEEDED (Patient taking differently: Take 5 mg by mouth daily as needed for anxiety. ) 30 tablet 2  . donepezil (ARICEPT) 10 MG tablet Take 1 tablet (10 mg total) by mouth at bedtime. 90 tablet 1  . escitalopram (LEXAPRO) 10 MG tablet Take 10 mg by mouth daily.   4  . gabapentin (NEURONTIN) 300 MG capsule Take 1 capsule by mouth every morning and 2 capsules by mouth every evening at bedtime (Patient taking differently: Take 300-600 mg by mouth See admin instructions. Take 1 capsule (300 mg)  by mouth every morning and 2 capsules (600 mg) by mouth every evening at bedtime) 270 capsule 1  . HYSINGLA ER 40 MG T24A Take 40 mg by mouth at bedtime.   0  . metoprolol tartrate (LOPRESSOR) 50 MG tablet Take 0.5 tablets (25 mg total) by mouth 2 (two) times daily. 90 tablet 1  . omeprazole (PRILOSEC) 20 MG capsule Take 1 capsule (20 mg total) by mouth daily. 90 capsule 3  . oxyCODONE-acetaminophen (PERCOCET) 7.5-325 MG tablet Take 1 tablet by mouth every 8 (eight) hours as needed for moderate pain or severe pain.    . vitamin B-12 (CYANOCOBALAMIN) 1000 MCG tablet Take 1,000 mcg by mouth daily.      No results found for this or any previous visit (from the past 48 hour(s)). No results found.  Review of Systems  Musculoskeletal: Positive for joint pain.  All other systems reviewed and are negative.   Blood pressure (!) 184/62, pulse 61, temperature 98.2 F (36.8 C), temperature source Oral, resp. rate 18, height 5\' 5"  (1.651 m), weight 63.5 kg, SpO2 99 %. Physical Exam  Constitutional: She is oriented to person, place, and time. She  appears well-developed and well-nourished.  HENT:  Head: Normocephalic and atraumatic.  Eyes: Pupils are equal, round, and reactive to light.  Neck: Normal range of motion.  Cardiovascular: Normal rate.  Respiratory: Effort normal.  GI: Soft.  Musculoskeletal:       Right knee: She exhibits decreased range of motion, swelling, effusion, abnormal alignment and abnormal meniscus. Tenderness found. Lateral joint line tenderness noted.  Neurological: She is alert and oriented to person, place, and  time.  Skin: Skin is warm and dry.  Psychiatric: She has a normal mood and affect.     Assessment/Plan Right knee acute pain with recurrent effusions  To the OR today for a right knee diagnostic and hopefully therapeutic arthroscopy.  Risks and benefits have been discussed in detail.  Kathryne Hitch, MD 02/14/2018, 12:06 PM

## 2018-02-14 NOTE — Anesthesia Preprocedure Evaluation (Signed)
Anesthesia Evaluation  Patient identified by MRN, date of birth, ID band Patient awake    Reviewed: Allergy & Precautions, NPO status , Patient's Chart, lab work & pertinent test results  History of Anesthesia Complications Negative for: history of anesthetic complications  Airway Mallampati: II  TM Distance: >3 FB Neck ROM: Full    Dental  (+) Edentulous Upper, Dental Advisory Given   Pulmonary Current Smoker, former smoker,    breath sounds clear to auscultation       Cardiovascular hypertension, Pt. on home beta blockers + CAD and + Peripheral Vascular Disease  Normal cardiovascular exam  Study Highlights     Nuclear stress EF: 63%.  There was no ST segment deviation noted during stress.  The study is normal.  This is a low risk study.  The left ventricular ejection fraction is normal (55-65%).   Normal resting and stress perfusion. No ischemia or infarction EF 63%     Neuro/Psych PSYCHIATRIC DISORDERS Anxiety Depression Dementia CVA    GI/Hepatic Neg liver ROS, hiatal hernia, GERD  ,  Endo/Other    Renal/GU negative Renal ROS     Musculoskeletal  (+) Arthritis , Osteoarthritis,  Pt has chronic back and leg pain.  Spinal cord stimulator in place.   Abdominal   Peds  Hematology   Anesthesia Other Findings   Reproductive/Obstetrics                             Anesthesia Physical  Anesthesia Plan  ASA: III  Anesthesia Plan: General   Post-op Pain Management:    Induction: Intravenous  PONV Risk Score and Plan: 3 and Ondansetron, Dexamethasone and Diphenhydramine  Airway Management Planned: LMA  Additional Equipment:   Intra-op Plan:   Post-operative Plan: Extubation in OR  Informed Consent: I have reviewed the patients History and Physical, chart, labs and discussed the procedure including the risks, benefits and alternatives for the proposed anesthesia with  the patient or authorized representative who has indicated his/her understanding and acceptance.   Dental advisory given  Plan Discussed with: Anesthesiologist and CRNA  Anesthesia Plan Comments:         Anesthesia Quick Evaluation

## 2018-02-14 NOTE — Brief Op Note (Signed)
02/14/2018  2:21 PM  PATIENT:  Sara [name]  77 y.o. female  PRE-OPERATIVE DIAGNOSIS:  recurrent right knee effusion  POST-OPERATIVE DIAGNOSIS:  recurrent right knee effusion  PROCEDURE:  Procedure(s): RIGHT KNEE ARTHROSCOPY WITH POSSIBLE PARTIAL MENISCECTOMY (Right)  SURGEON:  Surgeon(s) and Role:    Kathryne Hitch, MD - Primary  PHYSICIAN ASSISTANT:  Rexene Edison, PA-C  ANESTHESIA:   local and general  EBL:  50 mL   COUNTS:  YES  DICTATION: .Other Dictation: Dictation Number [id number]  PLAN OF CARE: Discharge to home after PACU  PATIENT DISPOSITION:  PACU - hemodynamically stable.   Delay start of Pharmacological VTE agent (>24hrs) due to surgical blood loss or risk of bleeding: no

## 2018-02-15 ENCOUNTER — Encounter (HOSPITAL_COMMUNITY): Payer: Self-pay | Admitting: Orthopaedic Surgery

## 2018-02-21 ENCOUNTER — Inpatient Hospital Stay (HOSPITAL_COMMUNITY): Admission: RE | Admit: 2018-02-21 | Payer: Medicare Other | Source: Ambulatory Visit

## 2018-02-21 ENCOUNTER — Ambulatory Visit: Payer: Medicare Other | Admitting: Family

## 2018-02-22 ENCOUNTER — Ambulatory Visit (INDEPENDENT_AMBULATORY_CARE_PROVIDER_SITE_OTHER): Payer: Medicare Other | Admitting: Orthopaedic Surgery

## 2018-02-22 ENCOUNTER — Encounter (INDEPENDENT_AMBULATORY_CARE_PROVIDER_SITE_OTHER): Payer: Self-pay | Admitting: Orthopaedic Surgery

## 2018-02-22 DIAGNOSIS — Z9889 Other specified postprocedural states: Secondary | ICD-10-CM

## 2018-02-22 NOTE — Progress Notes (Signed)
Patient is a very pleasant 77 year old female who is 1 week out from a right knee arthroscopy where we performed a partial medial meniscectomy.  We found really good looking cartilage throughout her knee.  She had some grade 2 to grade 3 chondral malacia the medial compartment but no exposed bone at all.  The remainder of her knee was pristine.  On exam today her sutures look good to remove them and the incisions look fine.  He does have a moderate joint effusion and I did drain 30 to 40 cc of fluid off of her knee that gave her immediate relief.  I did place an injection of lidocaine and Depo-Medrol in the knee.  This point she will work on increasing her activities as comfort allows.  I offered her physical therapy but she does not feel like she needs to go there.  I like to see her back in 4 weeks to see how she is doing overall.  All questions concerns were answered and addressed.  We did go over arthroscopy pictures as well.

## 2018-03-22 ENCOUNTER — Telehealth (INDEPENDENT_AMBULATORY_CARE_PROVIDER_SITE_OTHER): Payer: Self-pay

## 2018-03-22 ENCOUNTER — Ambulatory Visit (INDEPENDENT_AMBULATORY_CARE_PROVIDER_SITE_OTHER): Payer: Medicare Other | Admitting: Orthopaedic Surgery

## 2018-03-22 ENCOUNTER — Encounter (INDEPENDENT_AMBULATORY_CARE_PROVIDER_SITE_OTHER): Payer: Self-pay | Admitting: Orthopaedic Surgery

## 2018-03-22 DIAGNOSIS — M1711 Unilateral primary osteoarthritis, right knee: Secondary | ICD-10-CM

## 2018-03-22 DIAGNOSIS — M25461 Effusion, right knee: Secondary | ICD-10-CM

## 2018-03-22 DIAGNOSIS — Z9889 Other specified postprocedural states: Secondary | ICD-10-CM

## 2018-03-22 NOTE — Telephone Encounter (Signed)
Noted  

## 2018-03-22 NOTE — Progress Notes (Signed)
The patient is continue to follow-up status post arthroscopic surgery on her right knee.  She is a very young 77 year old at the time of surgery found a significant medial meniscal tear but only mild cartilage changes throughout her knee with no derangement in the lateral aspect of her knee and no exposed bone.  The cartilage thinning was minimal just medial and patellofemoral.  Her ACL and PCL were intact and lateral meniscus was pristine.  She still having some swelling and problems with her knee.  On examination she does have moderate effusion of her right knee and I did aspirate 40 cc of serous fluid from the knee.  I then placed a steroid in the knee.  She is a perfect candidate for hyaluronic acid and a compressive sleeve.  The sleeve she can get over the counter on line.  This will be a copper fit sleeve.  We will see her back in 4 weeks for a hyaluronic acid injection in that right knee.  All question concerns were answered and addressed.

## 2018-03-22 NOTE — Telephone Encounter (Signed)
Right knee gel injection  

## 2018-03-27 ENCOUNTER — Telehealth (INDEPENDENT_AMBULATORY_CARE_PROVIDER_SITE_OTHER): Payer: Self-pay

## 2018-03-27 NOTE — Telephone Encounter (Signed)
Submitted VOB for SynviscOne, right knee. 

## 2018-04-06 ENCOUNTER — Telehealth (INDEPENDENT_AMBULATORY_CARE_PROVIDER_SITE_OTHER): Payer: Self-pay

## 2018-04-06 NOTE — Telephone Encounter (Signed)
Patient is approved for SynviscOne, right knee. Buy & Bill Patient will be responsible for 20% OOP. Co-pay of $30.00 No PA required  Appt. 04/20/2018 with Dr. Magnus IvanBlackman

## 2018-04-20 ENCOUNTER — Ambulatory Visit (INDEPENDENT_AMBULATORY_CARE_PROVIDER_SITE_OTHER): Payer: Medicare Other | Admitting: Orthopaedic Surgery

## 2018-04-20 ENCOUNTER — Encounter (INDEPENDENT_AMBULATORY_CARE_PROVIDER_SITE_OTHER): Payer: Self-pay | Admitting: Orthopaedic Surgery

## 2018-04-20 DIAGNOSIS — M1711 Unilateral primary osteoarthritis, right knee: Secondary | ICD-10-CM

## 2018-04-20 MED ORDER — LIDOCAINE HCL 1 % IJ SOLN
3.0000 mL | INTRAMUSCULAR | Status: AC | PRN
  Administered 2018-04-20: 3 mL

## 2018-04-20 MED ORDER — HYLAN G-F 20 48 MG/6ML IX SOSY
48.0000 mg | PREFILLED_SYRINGE | INTRA_ARTICULAR | Status: AC | PRN
  Administered 2018-04-20: 48 mg via INTRA_ARTICULAR

## 2018-04-20 NOTE — Progress Notes (Signed)
   Procedure Note  Patient: Sara [name]             Date of Birth: 08/10/1940           MRN: [id number]             Visit Date: 04/20/2018  Procedures: Visit Diagnoses: Unilateral primary osteoarthritis, right knee  Large Joint Inj on 04/20/2018 3:24 PM Indications: pain and diagnostic evaluation Details: 22 G 1.5 in needle, superolateral approach  Arthrogram: No  Medications: 48 mg Hylan 48 MG/6ML; 3 mL lidocaine 1 % Outcome: tolerated well, no immediate complications Procedure, treatment alternatives, risks and benefits explained, specific risks discussed. Consent was given by the patient. Immediately prior to procedure a time out was called to verify the correct patient, procedure, equipment, support staff and site/side marked as required. Patient was prepped and draped in the usual sterile fashion.    The patient is here today for scheduled hyaluronic acid injection in her right knee to treat the pain from osteoarthritis.  This is with Synvisc 1.  On exam she does have a moderate effusion of her right knee and I did place 5 cc of lidocaine in the knee and then aspirated about 50 cc of serosanguineous fluid from the knee.  She understands why we are placing the hyaluronic injection and she tolerated it well.  She also understands that the only other surgical option would be a knee replacement if this fails.  All questions concerns were answered and addressed.  Follow-up will be as needed.  She understands she can always get a steroid injection in that right knee 3 months from now or repeat hyaluronic acid injection in the right knee 6 months from now.

## 2018-05-03 ENCOUNTER — Other Ambulatory Visit: Payer: Self-pay | Admitting: Internal Medicine

## 2018-05-05 ENCOUNTER — Ambulatory Visit (HOSPITAL_BASED_OUTPATIENT_CLINIC_OR_DEPARTMENT_OTHER)
Admission: RE | Admit: 2018-05-05 | Discharge: 2018-05-05 | Disposition: A | Discharge status: Home / Self Care | Payer: Medicare Other | Source: Ambulatory Visit | Attending: Internal Medicine | Admitting: Internal Medicine

## 2018-05-05 ENCOUNTER — Ambulatory Visit (INDEPENDENT_AMBULATORY_CARE_PROVIDER_SITE_OTHER): Payer: Medicare Other | Admitting: Internal Medicine

## 2018-05-05 ENCOUNTER — Encounter: Payer: Self-pay | Admitting: Internal Medicine

## 2018-05-05 VITALS — BP 122/78 | HR 67 | Temp 97.7°F | Resp 16 | Ht 65.0 in | Wt 139.1 lb

## 2018-05-05 DIAGNOSIS — F419 Anxiety disorder, unspecified: Secondary | ICD-10-CM

## 2018-05-05 DIAGNOSIS — D649 Anemia, unspecified: Secondary | ICD-10-CM | POA: Diagnosis not present

## 2018-05-05 DIAGNOSIS — J40 Bronchitis, not specified as acute or chronic: Secondary | ICD-10-CM | POA: Diagnosis not present

## 2018-05-05 DIAGNOSIS — F329 Major depressive disorder, single episode, unspecified: Secondary | ICD-10-CM

## 2018-05-05 DIAGNOSIS — R059 Cough, unspecified: Secondary | ICD-10-CM

## 2018-05-05 DIAGNOSIS — R05 Cough: Secondary | ICD-10-CM

## 2018-05-05 MED ORDER — DOXYCYCLINE HYCLATE 100 MG PO TABS: 100.0000 mg | ORAL_TABLET | Freq: Two times a day (BID) | ORAL | 0 refills | Status: DC

## 2018-05-05 NOTE — Progress Notes (Signed)
Subjective:    Patient ID: Sara [name], female    DOB: 01/26/1941, 78 y.o.   MRN: [id number]  DOS:  05/05/2018 Type of visit - description: Acute visit, here with her husband Reports a one-week history of cough with no sputum production and some nasal congestion. She is feeling quite fatigued. She is a former smoker. History of previous pneumonia   Review of Systems + Chills, no fevers. + Mild nasal congestion and clear nasal discharge. No aches or pains, no nausea or vomiting Noted some chest "rattling",  wheezing. Some dizziness.  + Malaise.  Past Medical History:  Diagnosis Date  . Anxiety   . Arthritis   . Bronchitis    hx of  . Carotid artery occlusion   . Chronic pain syndrome    pain management  . Coronary artery disease    sees Dr. Patty Sermons  . Dementia (HCC)   . GERD (gastroesophageal reflux disease)    hx of  . H/O hiatal hernia   . Hypercholesteremia   . Hypertension    all managed by Dr. Haynes Dage bill  . Mitral valve regurgitation    trace  . Non-traumatic compression fracture of thoracic vertebra (HCC)    T9  . Pneumonia 05/27/2017  . Stroke (HCC)   . Wears dentures   . Wears glasses     Past Surgical History:  Procedure Laterality Date  . BACK SURGERY     x 3 Dr Juliene Pina, last @ [facility] ~ 2005, rods, fusion, four surgery  . CARDIAC CATHETERIZATION  1994  . CAROTID ENDARTERECTOMY    . CARPAL TUNNEL RELEASE     left  . CHOLECYSTECTOMY    . ENDARTERECTOMY  04/24/2012   Procedure: ENDARTERECTOMY CAROTID;  Surgeon: Larina Earthly, MD;  Location: [facility] OR;  Service: Vascular;  Laterality: Left;  . HAND SURGERY     bilateral  . HIP FRACTURE SURGERY Left   . HIP PINNING,CANNULATED Left 02/08/2015   Procedure: CANNULATED HIP PINNING;  Surgeon: Kathryne Hitch, MD;  Location: [facility] ORS;  Service: Orthopedics;  Laterality: Left;  . HIP PINNING,CANNULATED Right 03/31/2016   Procedure: CANNULATED HIP PINNING;  Surgeon: Kathryne Hitch, MD;   Location: [facility] ORS;  Service: Orthopedics;  Laterality: Right;  . HYSTEROTOMY    . KNEE ARTHROSCOPY Right 02/14/2018   Procedure: RIGHT KNEE ARTHROSCOPY WITH POSSIBLE PARTIAL MENISCECTOMY;  Surgeon: Kathryne Hitch, MD;  Location: [facility] OR;  Service: Orthopedics;  Laterality: Right;  . MULTIPLE TOOTH EXTRACTIONS    . SPINAL CORD STIMULATOR INSERTION    . TEE WITHOUT CARDIOVERSION  04/12/2012   Procedure: TRANSESOPHAGEAL ECHOCARDIOGRAM (TEE);  Surgeon: Lewayne Bunting, MD;  Location: [facility] ENDOSCOPY;  Service: Cardiovascular;  Laterality: N/A;  . TONSILLECTOMY      Social History   Socioeconomic History  . Marital status: Married    Spouse name: Not on file  . Number of children: 4  . Years of education: Not on file  . Highest education level: Not on file  Occupational History  . Occupation: retired   Engineer, production  . Financial resource strain: Not on file  . Food insecurity:    Worry: Not on file    Inability: Not on file  . Transportation needs:    Medical: Not on file    Non-medical: Not on file  Tobacco Use  . Smoking status: Former Smoker    Packs/day: 0.50    Years: 60.00    Pack years: 30.00  Types: Cigarettes  . Smokeless tobacco: Never Used  . Tobacco comment: quit 04-2015  Substance and Sexual Activity  . Alcohol use: No    Alcohol/week: 0.0 standard drinks  . Drug use: No  . Sexual activity: Not Currently  Lifestyle  . Physical activity:    Days per week: Not on file    Minutes per session: Not on file  . Stress: Not on file  Relationships  . Social connections:    Talks on phone: Not on file    Gets together: Not on file    Attends religious service: Not on file    Active member of club or organization: Not on file    Attends meetings of clubs or organizations: Not on file    Relationship status: Not on file  . Intimate partner violence:    Fear of current or ex partner: Not on file    Emotionally abused: Not on file    Physically abused: Not on  file    Forced sexual activity: Not on file  Other Topics Concern  . Not on file  Social History Narrative   Lives w/ husband in a one story home.     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   Education: high school      Allergies as of 05/05/2018      Reactions   Penicillins Other (See Comments)   Has patient had a PCN reaction causing immediate rash, facial/tongue/throat swelling, SOB or lightheadedness with hypotension: # # # Yes # # # Has patient had a PCN reaction causing severe rash involving mucus membranes or skin necrosis: No Has patient had a PCN reaction that required hospitalization No Has patient had a PCN reaction occurring within the last 10 years: No If all of the above answers are "NO", then may proceed with Cephalosporin use.   Morphine And Related Rash   Percocet [oxycodone-acetaminophen] Rash   Tolerates APAP (including IV), morphine, and Hydrocodone/APAP   Hydrochlorothiazide Other (See Comments)   Pt does not remember   Indomethacin    Pt does not remember   Paroxetine Hcl    Pt does not remember   Pravachol Other (See Comments)   Pt does not remember   Quinine Derivatives    Pt does not remember   Wellbutrin [bupropion]    agitation   Zocor [simvastatin - High Dose] Other (See Comments)   Weakness/no energy      Medication List       Accurate as of May 05, 2018 11:59 PM. Always use your most recent med list.        alendronate 70 MG tablet Commonly known as:  FOSAMAX Take 1 tablet (70 mg total) by mouth once a week. Take with a full glass of water on an empty stomach.   amLODipine 10 MG tablet Commonly known as:  NORVASC Take 1 tablet (10 mg total) by mouth daily.   aspirin EC 81 MG tablet Take 81 mg by mouth daily.   atorvastatin 40 MG tablet Commonly known as:  LIPITOR Take 0.5 tablets (20 mg total) by mouth daily.   diazepam 5 MG tablet Commonly known as:  VALIUM TAKE 1 TABLET BY MOUTH EVERY DAY  AS NEEDED   donepezil 10 MG tablet Commonly known as:  ARICEPT Take 1 tablet (10 mg total) by mouth at bedtime.   doxycycline 100 MG tablet Commonly known as:  VIBRA-TABS Take 1  tablet (100 mg total) by mouth 2 (two) times daily.   gabapentin 300 MG capsule Commonly known as:  NEURONTIN Take 1 capsule by mouth every morning and 2 capsules by mouth every evening at bedtime   HYSINGLA ER 40 MG T24a Generic drug:  HYDROcodone Bitartrate ER Take 40 mg by mouth at bedtime.   metoprolol tartrate 50 MG tablet Commonly known as:  LOPRESSOR Take 0.5 tablets (25 mg total) by mouth 2 (two) times daily. (BETA BLOCKER)   omeprazole 20 MG capsule Commonly known as:  PRILOSEC Take 1 capsule (20 mg total) by mouth daily.   oxyCODONE-acetaminophen 7.5-325 MG tablet Commonly known as:  PERCOCET Take 1 tablet by mouth every 6 (six) hours as needed for moderate pain or severe pain.   vitamin B-12 1000 MCG tablet Commonly known as:  CYANOCOBALAMIN Take 1,000 mcg by mouth daily.           Objective:   Physical Exam BP 122/78 (BP Location: Left Arm, Patient Position: Sitting, Cuff Size: Small)   Pulse 67   Temp 97.7 F (36.5 C) (Oral)   Resp 16   Ht 5\' 5"  (1.651 m)   Wt 139 lb 2 oz (63.1 kg)   SpO2 93%   BMI 23.15 kg/m  General:   Well developed, NAD, BMI noted. HEENT:  Normocephalic . Face symmetric, atraumatic. TMs: Normal Throat symmetric and not red Nose congested, sinuses slightly TTP at both maxillary areas. Lungs:  Few rhonchi with cough, decreased breath sounds left base?Marland Kitchen. Normal respiratory effort, no intercostal retractions, no accessory muscle use. Heart: RRR,  no murmur.  No pretibial edema bilaterally  Skin: Not pale. Not jaundice Neurologic:  alert & oriented X3.  Speech normal, walk with great difficulty, mostly from knee pain but also she seems weaker than before. Uses a walker. Psych--  Cognition and judgment appear intact.  Cooperative with normal  attention span and concentration.  Behavior appropriate. No anxious or depressed appearing.      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 Depression started Lexapro around 04-2011 Mild Dementia CV --CAD  --H/o Stroke 2013:non hemorrhagic infarcts scattered throughout the [place] . 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  Mucinex DM, supportive treatment.  See AVS. Suspect anemia: Patient looks pale.  Will check CBC, iron, ferritin Anxiety: Not taking Lexapro MSK : s/p R knee arthroscopy, 01/2018, mobility stil;l limited RTC : has an appointment in 1  week, advised patient to keep it

## 2018-05-05 NOTE — Patient Instructions (Addendum)
GO TO THE LAB : Get the blood work     See you next week for follow-up  ====  Rest, fluids , tylenol  For cough:  Take Mucinex DM  as needed until better  For nasal congestion: Use OTC  Flonase : 2 nasal sprays on each side of the nose in the morning until you feel better r   Avoid decongestants such as  Pseudoephedrine or phenylephrine   Take the antibiotic as prescribed  (Doxycycline)  Call if not gradually better  s  Call anytime if the symptoms are severe

## 2018-05-05 NOTE — Progress Notes (Signed)
Pre visit review using our clinic review tool, if applicable. No additional management support is needed unless otherwise documented below in the visit note. 

## 2018-05-06 LAB — CBC WITH DIFFERENTIAL/PLATELET
ABSOLUTE MONOCYTES: 968 {cells}/uL — AB (ref 200–950)
BASOS PCT: 0.5 %
Basophils Absolute: 38 cells/uL (ref 0–200)
Eosinophils Absolute: 293 cells/uL (ref 15–500)
Eosinophils Relative: 3.9 %
HEMATOCRIT: 39.3 % (ref 35.0–45.0)
Hemoglobin: 13.2 g/dL (ref 11.7–15.5)
LYMPHS ABS: 2393 {cells}/uL (ref 850–3900)
MCH: 30.1 pg (ref 27.0–33.0)
MCHC: 33.6 g/dL (ref 32.0–36.0)
MCV: 89.7 fL (ref 80.0–100.0)
MPV: 10.9 fL (ref 7.5–12.5)
Monocytes Relative: 12.9 %
NEUTROS PCT: 50.8 %
Neutro Abs: 3810 cells/uL (ref 1500–7800)
PLATELETS: 295 10*3/uL (ref 140–400)
RBC: 4.38 10*6/uL (ref 3.80–5.10)
RDW: 12.9 % (ref 11.0–15.0)
TOTAL LYMPHOCYTE: 31.9 %
WBC: 7.5 10*3/uL (ref 3.8–10.8)

## 2018-05-06 LAB — BASIC METABOLIC PANEL
BUN/Creatinine Ratio: 19 (calc) (ref 6–22)
BUN: 23 mg/dL (ref 7–25)
CO2: 28 mmol/L (ref 20–32)
CREATININE: 1.24 mg/dL — AB (ref 0.60–0.88)
Calcium: 9.6 mg/dL (ref 8.6–10.4)
Chloride: 104 mmol/L (ref 98–110)
Glucose, Bld: 100 mg/dL — ABNORMAL HIGH (ref 65–99)
POTASSIUM: 5.1 mmol/L (ref 3.5–5.3)
Sodium: 143 mmol/L (ref 135–146)

## 2018-05-06 LAB — IRON: Iron: 38 ug/dL — ABNORMAL LOW (ref 45–160)

## 2018-05-06 LAB — FERRITIN: FERRITIN: 51 ng/mL (ref 16–288)

## 2018-05-06 NOTE — Assessment & Plan Note (Signed)
PLAN: Cough: Suspect pneumonia, chest x-ray stat: No infiltrate thus dx bronchitis and ?early pneumonia Plan: Doxycycline for 1 week.  Mucinex DM, supportive treatment.  See AVS. Suspect anemia: Patient looks pale.  Will check CBC, iron, ferritin Anxiety: Not taking Lexapro MSK : s/p R knee arthroscopy, 01/2018, mobility stil;l limited RTC : has an appointment in 1 week, advised patient to keep it

## 2018-05-12 ENCOUNTER — Encounter: Payer: Self-pay | Admitting: Internal Medicine

## 2018-05-12 ENCOUNTER — Other Ambulatory Visit: Payer: Self-pay

## 2018-05-12 ENCOUNTER — Ambulatory Visit (INDEPENDENT_AMBULATORY_CARE_PROVIDER_SITE_OTHER): Payer: Medicare Other | Admitting: Internal Medicine

## 2018-05-12 VITALS — BP 122/78 | HR 61 | Temp 97.9°F | Resp 17 | Ht 65.0 in | Wt 138.0 lb

## 2018-05-12 DIAGNOSIS — R05 Cough: Secondary | ICD-10-CM

## 2018-05-12 DIAGNOSIS — E7800 Pure hypercholesterolemia, unspecified: Secondary | ICD-10-CM | POA: Diagnosis not present

## 2018-05-12 DIAGNOSIS — I714 Abdominal aortic aneurysm, without rupture: Secondary | ICD-10-CM

## 2018-05-12 DIAGNOSIS — R059 Cough, unspecified: Secondary | ICD-10-CM

## 2018-05-12 DIAGNOSIS — I7143 Infrarenal abdominal aortic aneurysm, without rupture: Secondary | ICD-10-CM

## 2018-05-12 LAB — LIPID PANEL
Cholesterol: 134 mg/dL (ref <200)
HDL: 46 mg/dL — ABNORMAL LOW (ref >50)
LDL Cholesterol (Calc): 65 mg/dL (calc)
NON-HDL CHOLESTEROL (CALC): 88 mg/dL (ref <130)
TRIGLYCERIDES: 144 mg/dL (ref <150)
Total CHOL/HDL Ratio: 2.9 (calc) (ref <5.0)

## 2018-05-12 LAB — ALT: ALT: 8 U/L (ref 6–29)

## 2018-05-12 LAB — AST: AST: 13 U/L (ref 10–35)

## 2018-05-12 NOTE — Progress Notes (Signed)
Subjective:    Patient ID: Sara BrickRebecca S [name], female    DOB: 02/13/1941, 78 y.o.   MRN: [id number]  DOS:  05/12/2018 Type of visit - description: Follow-up Was seen with cough and fatigue, improving, still has residual cough and some fatigue.  Review of Systems Denies fever chills Shortness of breath back to baseline No sputum production Denies any abdominal pain, has chronic back pain.  Past Medical History:  Diagnosis Date  . Anxiety   . Arthritis   . Bronchitis    hx of  . Carotid artery occlusion   . Chronic pain syndrome    pain management  . Coronary artery disease    sees Dr. Patty SermonsBrackbill  . Dementia (HCC)   . GERD (gastroesophageal reflux disease)    hx of  . H/O hiatal hernia   . Hypercholesteremia   . Hypertension    all managed by Dr. Haynes DageBrack bill  . Mitral valve regurgitation    trace  . Non-traumatic compression fracture of thoracic vertebra (HCC)    T9  . Pneumonia 05/27/2017  . Stroke (HCC)   . Wears dentures   . Wears glasses     Past Surgical History:  Procedure Laterality Date  . BACK SURGERY     x 3 Dr Juliene PinaGeoffrey, [facility] ~ [facility], rods, fusion, four surgery  . CARDIAC CATHETERIZATION  1994  . CAROTID ENDARTERECTOMY    . CARPAL TUNNEL RELEASE     left  . CHOLECYSTECTOMY    . ENDARTERECTOMY  04/24/2012   Procedure: ENDARTERECTOMY CAROTID;  Surgeon: Larina Earthlyodd F Early, MD;  [facility];  Service: Vascular;  Laterality: Left;  . HAND SURGERY     bilateral  . HIP FRACTURE SURGERY Left   . HIP PINNING,CANNULATED Left 02/08/2015   Procedure: CANNULATED HIP PINNING;  Surgeon: Kathryne Hitchhristopher Y Blackman, MD;  Location: [facility] ORS;  Service: Orthopedics;  Laterality: Left;  . HIP PINNING,CANNULATED Right 03/31/2016   Procedure: CANNULATED HIP PINNING;  Surgeon: Kathryne Hitchhristopher Y Blackman, MD;  Location: [facility] ORS;  Service: Orthopedics;  Laterality: Right;  . HYSTEROTOMY    . KNEE ARTHROSCOPY Right 02/14/2018   Procedure: RIGHT KNEE ARTHROSCOPY WITH POSSIBLE PARTIAL  MENISCECTOMY;  Surgeon: Kathryne HitchBlackman, Christopher Y, MD;  Location: [facility] OR;  Service: Orthopedics;  Laterality: Right;  . MULTIPLE TOOTH EXTRACTIONS    . SPINAL CORD STIMULATOR INSERTION    . TEE WITHOUT CARDIOVERSION  04/12/2012   Procedure: TRANSESOPHAGEAL ECHOCARDIOGRAM (TEE);  Surgeon: Lewayne BuntingBrian S Crenshaw, MD;  [facility];  Service: Cardiovascular;  Laterality: N/A;  . TONSILLECTOMY      Social History   Socioeconomic History  . Marital status: Married    Spouse name: Not on file  . Number of children: 4  . Years of education: Not on file  . Highest education level: Not on file  Occupational History  . Occupation: retired   Engineer, productionocial Needs  . Financial resource strain: Not on file  . Food insecurity:    Worry: Not on file    Inability: Not on file  . Transportation needs:    Medical: Not on file    Non-medical: Not on file  Tobacco Use  . Smoking status: Former Smoker    Packs/day: 0.50    Years: 60.00    Pack years: 30.00    Types: Cigarettes  . Smokeless tobacco: Never Used  . Tobacco comment: quit 04-2015  Substance and Sexual Activity  . Alcohol use: No    Alcohol/week: 0.0 standard drinks  .  Drug use: No  . Sexual activity: Not Currently  Lifestyle  . Physical activity:    Days per week: Not on file    Minutes per session: Not on file  . Stress: Not on file  Relationships  . Social connections:    Talks on phone: Not on file    Gets together: Not on file    Attends religious service: Not on file    Active member of club or organization: Not on file    Attends meetings of clubs or organizations: Not on file    Relationship status: Not on file  . Intimate partner violence:    Fear of current or ex partner: Not on file    Emotionally abused: Not on file    Physically abused: Not on file    Forced sexual activity: Not on file  Other Topics Concern  . Not on file  Social History Narrative   Lives w/ husband in a one story home.     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   Education: high school      Allergies as of 05/12/2018      Reactions   Penicillins Other (See Comments)   Has patient had a PCN reaction causing immediate rash, facial/tongue/throat swelling, SOB or lightheadedness with hypotension: # # # Yes # # # Has patient had a PCN reaction causing severe rash involving mucus membranes or skin necrosis: No Has patient had a PCN reaction that required hospitalization No Has patient had a PCN reaction occurring within the last 10 years: No If all of the above answers are "NO", then may proceed with Cephalosporin use.   Morphine And Related Rash   Percocet [oxycodone-acetaminophen] Rash   Tolerates APAP (including IV), morphine, and Hydrocodone/APAP   Hydrochlorothiazide Other (See Comments)   Pt does not remember   Indomethacin    Pt does not remember   Paroxetine Hcl    Pt does not remember   Pravachol Other (See Comments)   Pt does not remember   Quinine Derivatives    Pt does not remember   Wellbutrin [bupropion]    agitation   Zocor [simvastatin - High Dose] Other (See Comments)   Weakness/no energy      Medication List       Accurate as of May 12, 2018  2:29 PM. Always use your most recent med list.        alendronate 70 MG tablet Commonly known as:  FOSAMAX Take 1 tablet (70 mg total) by mouth once a week. Take with a full glass of water on an empty stomach.   amLODipine 10 MG tablet Commonly known as:  NORVASC Take 1 tablet (10 mg total) by mouth daily.   aspirin EC 81 MG tablet Take 81 mg by mouth daily.   atorvastatin 40 MG tablet Commonly known as:  LIPITOR Take 0.5 tablets (20 mg total) by mouth daily.   diazepam 5 MG tablet Commonly known as:  VALIUM TAKE 1 TABLET BY MOUTH EVERY DAY AS NEEDED   donepezil 10 MG tablet Commonly known as:  ARICEPT Take 1 tablet (10 mg total) by mouth at bedtime.   doxycycline 100 MG tablet Commonly known as:   VIBRA-TABS Take 1 tablet (100 mg total) by mouth 2 (two) times daily.   gabapentin 300 MG capsule Commonly known as:  NEURONTIN Take 1 capsule by mouth every morning and 2 capsules by  mouth every evening at bedtime   HYSINGLA ER 40 MG T24a Generic drug:  HYDROcodone Bitartrate ER Take 40 mg by mouth at bedtime.   lidocaine-prilocaine cream Commonly known as:  EMLA   metoprolol tartrate 50 MG tablet Commonly known as:  LOPRESSOR Take 0.5 tablets (25 mg total) by mouth 2 (two) times daily. (BETA BLOCKER)   omeprazole 20 MG capsule Commonly known as:  PRILOSEC Take 1 capsule (20 mg total) by mouth daily.   oxyCODONE-acetaminophen 7.5-325 MG tablet Commonly known as:  PERCOCET Take 1 tablet by mouth every 6 (six) hours as needed for moderate pain or severe pain.   vitamin B-12 1000 MCG tablet Commonly known as:  CYANOCOBALAMIN Take 1,000 mcg by mouth daily.           Objective:   Physical Exam BP 122/78   Pulse 61   Temp 97.9 F (36.6 C) (Oral)   Resp 17   Ht 5\' 5"  (1.651 m)   Wt 138 lb (62.6 kg)   SpO2 94%   BMI 22.96 kg/m      General:   Well developed, NAD, BMI noted.  HEENT:  Normocephalic . Face symmetric, atraumatic Lungs:  Good air movement, minimal rhonchi with cough.  No wheezing. Normal respiratory effort, no intercostal retractions, no accessory muscle use. Heart: RRR,  no murmur.  no pretibial edema bilaterally  Abdomen:  Not distended, soft, non-tender. Skin: Not pale. Not jaundice Neurologic:  alert & oriented X3.  Speech normal, gait not tested, sitting in a wheelchair Psych--  Cognition and judgment appear intact.  Cooperative with normal attention span and concentration.  Behavior appropriate. No anxious or depressed appearing.  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 Depression started Lexapro  around 04-2011 Mild Dementia CV --CAD  --H/o Stroke 2013:non hemorrhagic infarcts scattered throughout the [place] Hem[place] . 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   MSK: -Osteoporosis  last DEXA -hip fx 01-2015 -Vertebral FX (s/p v-plasty) - R Hip Fx 03-2016 -Chronic back pain , s/p spinal cord stimulator  Dr Jordan Likes   Abnormal CT abd/pelvis  2017: See full report, some of the findings>> Atherosclerotic ulcer left subclavian T 8 compression fracture Infrarenal abdominal aortic aneurysm 3.5 cm, follow-up 2 years Narrow  left renal artery , SMA occluded with pancreatic collaterals Mild mesenteric adenopathy, possibly stable, consider 2 year follow-up  R distal ureter enhancement per CT: Saw urology 07-2015, Rx a ultrasound   R adrenal adenoma Smoker , quit ~ 04-2015  Urosepsis 05-2015   PLAN: Cough: Since the last office visit she has improved, almost done with her antibiotics, recommend to continue Mucinex DM, good hydration, call if not back to baseline in few days.  Symbicort?  Round of steroids?Marland Kitchen Hyperlipidemia: On Lipitor, check a FLP, AST, ALT AAA: Last visualized 2017, check ultrasound. RTC 4 months

## 2018-05-12 NOTE — Patient Instructions (Signed)
GO TO THE LAB : Get the blood work     GO TO THE FRONT DESK Schedule your next appointment      Continue with Mucinex DM or Robitussin-DM  If you are not gradually back to your normal please let me know

## 2018-05-13 NOTE — Assessment & Plan Note (Signed)
Cough: Since the last office visit she has improved, almost done with her antibiotics, recommend to continue Mucinex DM, good hydration, call if not back to baseline in few days.  Symbicort?  Round of steroids?Marland Kitchen Hyperlipidemia: On Lipitor, check a FLP, AST, ALT AAA: Last visualized 2017, check ultrasound. RTC 4 months

## 2018-05-15 ENCOUNTER — Other Ambulatory Visit: Payer: Self-pay

## 2018-05-15 DIAGNOSIS — I714 Abdominal aortic aneurysm, without rupture, unspecified: Secondary | ICD-10-CM

## 2018-05-17 ENCOUNTER — Ambulatory Visit (HOSPITAL_BASED_OUTPATIENT_CLINIC_OR_DEPARTMENT_OTHER)
Admission: RE | Admit: 2018-05-17 | Discharge: 2018-05-17 | Disposition: A | Discharge status: Home / Self Care | Payer: Medicare Other | Source: Ambulatory Visit | Attending: Internal Medicine | Admitting: Internal Medicine

## 2018-05-17 DIAGNOSIS — I714 Abdominal aortic aneurysm, without rupture, unspecified: Secondary | ICD-10-CM

## 2018-05-18 ENCOUNTER — Telehealth: Payer: Self-pay | Admitting: Internal Medicine

## 2018-05-18 NOTE — Telephone Encounter (Signed)
Sent!

## 2018-05-18 NOTE — Telephone Encounter (Signed)
Pt is requesting refill on diazepam.   Last OV: 05/12/2018 Last Fill: 08/22/2017 #30 and 2RF UDS: under contract w/ pain medicine

## 2018-05-24 ENCOUNTER — Ambulatory Visit (INDEPENDENT_AMBULATORY_CARE_PROVIDER_SITE_OTHER): Payer: Medicare Other | Admitting: Orthopaedic Surgery

## 2018-05-29 ENCOUNTER — Encounter (INDEPENDENT_AMBULATORY_CARE_PROVIDER_SITE_OTHER): Payer: Self-pay | Admitting: Orthopaedic Surgery

## 2018-05-29 ENCOUNTER — Ambulatory Visit (INDEPENDENT_AMBULATORY_CARE_PROVIDER_SITE_OTHER): Payer: Medicare Other | Admitting: Orthopaedic Surgery

## 2018-05-29 ENCOUNTER — Ambulatory Visit (INDEPENDENT_AMBULATORY_CARE_PROVIDER_SITE_OTHER): Payer: Self-pay

## 2018-05-29 DIAGNOSIS — Z9889 Other specified postprocedural states: Secondary | ICD-10-CM | POA: Diagnosis not present

## 2018-05-29 DIAGNOSIS — M25561 Pain in right knee: Secondary | ICD-10-CM

## 2018-05-29 DIAGNOSIS — M25461 Effusion, right knee: Secondary | ICD-10-CM | POA: Diagnosis not present

## 2018-05-29 MED ORDER — LIDOCAINE HCL 1 % IJ SOLN
3.0000 mL | INTRAMUSCULAR | Status: AC | PRN
  Administered 2018-05-29: 3 mL

## 2018-05-29 MED ORDER — METHYLPREDNISOLONE ACETATE 40 MG/ML IJ SUSP
40.0000 mg | INTRAMUSCULAR | Status: AC | PRN
  Administered 2018-05-29: 40 mg via INTRA_ARTICULAR

## 2018-05-29 NOTE — Progress Notes (Signed)
+   Office Visit Note   Patient: Sara [name]           Date of Birth: 10/19/1940           MRN: [id number] Visit Date: 05/29/2018              Requested by: Wanda Plump, MD [address] [place], [place] [address] PCP: Wanda Plump, MD   Assessment & Plan: Visit Diagnoses:  1. Right knee pain, unspecified chronicity   2. Status post arthroscopy of right knee   3. Effusion, right knee     Plan: I did recommend a steroid injection in her right knee to see if we can calm down her acute pain.  I gave her reassurance that her knee should get better based on her arthroscopic pictures and the plain film findings.  However she is on chronic high-dose narcotics and that can be contributing to her pain as well.  I have recommended outpatient physical therapy to see if they can help with strengthening her quad muscles and increasing her mobility as well as decreasing her pain with any modalities that they can try to help with her knee.  All question concerns were answered and addressed.  We will see her back in about 6 weeks to see if this is helped.  There is nothing really else that I have to offer.  Follow-Up Instructions: Return in about 6 weeks (around 07/10/2018).   Orders:  Orders Placed This Encounter  Procedures  . Large Joint Inj  . XR Knee 1-2 Views Right   No orders of the defined types were placed in this encounter.     Procedures: Large Joint Inj: R knee on 05/29/2018 2:26 PM Indications: diagnostic evaluation and pain Details: 22 G 1.5 in needle, superolateral approach  Arthrogram: No  Medications: 3 mL lidocaine 1 %; 40 mg methylPREDNISolone acetate 40 MG/ML Outcome: tolerated well, no immediate complications Procedure, treatment alternatives, risks and benefits explained, specific risks discussed. Consent was given by the patient. Immediately prior to procedure a time  out was called to verify the correct patient, procedure, equipment, support staff and site/side marked as required. Patient was prepped and draped in the usual sterile fashion.       Clinical Data: No additional findings.   Subjective: Chief Complaint  Patient presents with  . Right Knee - Pain  Is now just over 3 months status post a right knee arthroscopy.  She is 78 years old and still having a very hard time getting over her knee arthroscopy.  At the time of surgery we found a significant medial meniscal tear but intact cartilage throughout the knee with only slight cartilage thinning.  She is someone who does ambulate using a walker and has a spinal cord stimulator.  She is also on Percocet 7.5 every 4-6 hours which she has taken regularly.  She is having a very hard time getting over the knee arthroscopy and having severe right knee pain.  I did try hyaluronic acid injection and this did not help her at all. HPI  Review of Systems  She currently denies any headache, chest pain, shortness of breath, fever, chills, nausea, vomiting. Objective: Vital Signs:  There were no vitals taken for this visit.  Physical Exam She is alert and orient x3 and in no acute distress Ortho Exam Examination of her right knee does show mild effusion.  Range of motion though is full.  The knee feels ligamentously stable. Specialty Comments:  No specialty comments available.  Imaging: Xr Knee 1-2 Views Right  Result Date: 05/29/2018 2 views of the right knee show well-maintained joint space with no acute findings.    PMFS History: Patient Active Problem List   Diagnosis Date Noted  . Status post arthroscopy of right knee 02/22/2018  . Acute medial meniscal tear, right, sequela   . Effusion, right knee   . Acute pain of right knee 01/09/2018  . Community acquired pneumonia of right upper lobe of lung (HCC) 07/13/2017  . Generalized weakness 07/13/2017  . Anxiety and depression 04/14/2016  .  Leukocytosis 03/31/2016  . Renal insufficiency 03/31/2016  . Neuropathy 02/12/2016  . AAA (abdominal aortic aneurysm) without rupture (HCC) 06/22/2015  . PCP NOTES >>>>>>>>>>>>>>>>>>>>>>>>>>>>>>>>>. 06/22/2015  . Malnutrition of moderate degree 06/05/2015  . Gait disorder 06/04/2015  . Elevated transaminase level 06/04/2015  . Pre-syncope 06/04/2015  . Closed left hip fracture (HCC) 02/07/2015  . Annual physical exam >>>>>>>>>>>> 01/22/2013  . Caregiver stress 01/22/2013  . 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 Chronic back pain 08/29/2012  . Occlusion and stenosis of carotid artery without mention of cerebral infarction 05/09/2012  . Carotid stenosis, left 04/13/2012  . 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 CVA (cerebral infarction) 04/11/2012  . Arthritis 08/05/2010  . Tobacco abuse 08/05/2010  . Mitral valve regurgitation   . Hypertension   . Hypercholesteremia   . Coronary artery disease    Past Medical History:  Diagnosis Date  . Anxiety   . Arthritis   . Bronchitis    hx of  . Carotid artery occlusion   . Chronic pain syndrome    pain management  . Coronary artery disease    sees Dr. Patty SermonsBrackbill  . Dementia (HCC)   . GERD (gastroesophageal reflux disease)    hx of  . H/O hiatal hernia   . Hypercholesteremia   . Hypertension    all managed by Dr. Haynes DageBrack bill  . Mitral valve regurgitation    trace  . Non-traumatic compression fracture of thoracic vertebra (HCC)    T9  . Pneumonia 05/27/2017  . Stroke (HCC)   . Wears dentures   . Wears glasses     Family History  Problem Relation Age of Onset  . Heart attack Father   . Heart disease Father        before age 80  . Heart disease Sister   . Cancer Sister   . Heart disease Sister     Past Surgical History:  Procedure Laterality Date  . BACK SURGERY     x 3 Dr Juliene PinaGeoffrey, [facility] ~ 2005, rods, fusion, four surgery  . CARDIAC CATHETERIZATION  1994  . CAROTID ENDARTERECTOMY    .  CARPAL TUNNEL RELEASE     left  . CHOLECYSTECTOMY    . ENDARTERECTOMY  04/24/2012   Procedure: ENDARTERECTOMY CAROTID;  Surgeon: Larina Earthlyodd F Early, MD;  [facility];  Service: Vascular;  Laterality: Left;  . HAND SURGERY     bilateral  . HIP FRACTURE SURGERY Left   . HIP PINNING,CANNULATED Left 02/08/2015   Procedure: CANNULATED HIP PINNING;  Surgeon: Kathryne Hitchhristopher Y Zahriyah Joo, MD;  Location: [facility] ORS;  Service: Orthopedics;  Laterality: Left;  . HIP PINNING,CANNULATED Right 03/31/2016   Procedure: CANNULATED HIP PINNING;  Surgeon: Kathryne Hitchhristopher Y Jerzey Komperda, MD;  Location: [facility] ORS;  Service: Orthopedics;  Laterality: Right;  . HYSTEROTOMY    . KNEE ARTHROSCOPY Right 02/14/2018   Procedure: RIGHT KNEE ARTHROSCOPY WITH POSSIBLE PARTIAL MENISCECTOMY;  Surgeon: Kathryne HitchBlackman, Hatsue Sime Y, MD;  Location: [facility] OR;  Service: Orthopedics;  Laterality: Right;  . MULTIPLE TOOTH EXTRACTIONS    . SPINAL CORD STIMULATOR INSERTION    . TEE WITHOUT CARDIOVERSION  04/12/2012   Procedure: TRANSESOPHAGEAL ECHOCARDIOGRAM (TEE);  Surgeon: Lewayne BuntingBrian S Crenshaw, MD;  [facility];  Service: Cardiovascular;  Laterality: N/A;  . TONSILLECTOMY     Social History   Occupational History  . Occupation: retired   Tobacco Use  . Smoking status: Former Smoker    Packs/day: 0.50    Years: 60.00    Pack years: 30.00    Types: Cigarettes  . Smokeless tobacco: Never Used  . Tobacco comment: quit 04-2015  Substance and Sexual Activity  . Alcohol use: No    Alcohol/week: 0.0 standard drinks  . Drug use: No  . Sexual activity: Not Currently

## 2018-06-06 ENCOUNTER — Telehealth: Payer: Self-pay | Admitting: Cardiology

## 2018-06-06 ENCOUNTER — Ambulatory Visit: Payer: Self-pay

## 2018-06-06 ENCOUNTER — Other Ambulatory Visit: Payer: Self-pay

## 2018-06-06 ENCOUNTER — Emergency Department (HOSPITAL_COMMUNITY): Payer: Medicare Other

## 2018-06-06 ENCOUNTER — Emergency Department (HOSPITAL_COMMUNITY)
Admission: EM | Admit: 2018-06-06 | Discharge: 2018-06-06 | Disposition: A | Discharge status: Home / Self Care | Payer: Medicare Other | Attending: Emergency Medicine | Admitting: Emergency Medicine

## 2018-06-06 ENCOUNTER — Encounter (HOSPITAL_COMMUNITY): Payer: Self-pay | Admitting: Family Medicine

## 2018-06-06 DIAGNOSIS — Z79899 Other long term (current) drug therapy: Secondary | ICD-10-CM | POA: Diagnosis not present

## 2018-06-06 DIAGNOSIS — Z7982 Long term (current) use of aspirin: Secondary | ICD-10-CM | POA: Insufficient documentation

## 2018-06-06 DIAGNOSIS — I2510 Atherosclerotic heart disease of native coronary artery without angina pectoris: Secondary | ICD-10-CM | POA: Diagnosis not present

## 2018-06-06 DIAGNOSIS — I10 Essential (primary) hypertension: Secondary | ICD-10-CM | POA: Diagnosis present

## 2018-06-06 DIAGNOSIS — I714 Abdominal aortic aneurysm, without rupture, unspecified: Secondary | ICD-10-CM

## 2018-06-06 DIAGNOSIS — R51 Headache: Secondary | ICD-10-CM | POA: Diagnosis not present

## 2018-06-06 DIAGNOSIS — Z87891 Personal history of nicotine dependence: Secondary | ICD-10-CM | POA: Insufficient documentation

## 2018-06-06 DIAGNOSIS — F0390 Unspecified dementia without behavioral disturbance: Secondary | ICD-10-CM | POA: Insufficient documentation

## 2018-06-06 LAB — BASIC METABOLIC PANEL
Anion gap: 8 (ref 5–15)
BUN: 29 mg/dL — ABNORMAL HIGH (ref 8–23)
CALCIUM: 9 mg/dL (ref 8.9–10.3)
CO2: 29 mmol/L (ref 22–32)
CREATININE: 1.03 mg/dL — AB (ref 0.44–1.00)
Chloride: 105 mmol/L (ref 98–111)
GFR calc non Af Amer: 51 mL/min — ABNORMAL LOW (ref >60)
GFR, EST AFRICAN AMERICAN: 59 mL/min — AB (ref >60)
Glucose, Bld: 131 mg/dL — ABNORMAL HIGH (ref 70–99)
Potassium: 3.8 mmol/L (ref 3.5–5.1)
Sodium: 142 mmol/L (ref 135–145)

## 2018-06-06 LAB — URINALYSIS, ROUTINE W REFLEX MICROSCOPIC
Bilirubin Urine: NEGATIVE
GLUCOSE, UA: NEGATIVE mg/dL
Hgb urine dipstick: NEGATIVE
KETONES UR: NEGATIVE mg/dL
LEUKOCYTE UA: NEGATIVE
NITRITE: NEGATIVE
PROTEIN: NEGATIVE mg/dL
Specific Gravity, Urine: 1.016 (ref 1.005–1.030)
pH: 6 (ref 5.0–8.0)

## 2018-06-06 LAB — CBC
HCT: 40.4 % (ref 36.0–46.0)
Hemoglobin: 12.6 g/dL (ref 12.0–15.0)
MCH: 29.7 pg (ref 26.0–34.0)
MCHC: 31.2 g/dL (ref 30.0–36.0)
MCV: 95.3 fL (ref 80.0–100.0)
NRBC: 0 % (ref 0.0–0.2)
PLATELETS: 307 10*3/uL (ref 150–400)
RBC: 4.24 MIL/uL (ref 3.87–5.11)
RDW: 13.2 % (ref 11.5–15.5)
WBC: 9.4 10*3/uL (ref 4.0–10.5)

## 2018-06-06 NOTE — Telephone Encounter (Signed)
FYI

## 2018-06-06 NOTE — ED Provider Notes (Signed)
[facility]-EMERGENCY DEPT Provider Note   CSN: [id number] Arrival date & time: 06/06/18  1303     History   Chief Complaint Chief Complaint  Patient presents with  . Hypertension  . Headache  . Dizziness    HPI Sara [name] is a 78 y.o. female.  HPI   She presents for evaluation of elevated blood pressure, headache and dizziness.  She checked her blood pressure at home this morning and it was 212/111.  This made her concerned so she came here for evaluation.  She reports being dizzy for 2 days, and having intermittent headache, some blurred vision, and difficulty walking.  She also fell 3 days ago, but did not injure herself.  She is taking her usual medications.  She has been able to eat, drink fluids, and walk.  He is here with family members.  She denies other recent illnesses or problems.  There are no other known modifying factors.  Past Medical History:  Diagnosis Date  . Anxiety   . Arthritis   . Bronchitis    hx of  . Carotid artery occlusion   . Chronic pain syndrome    pain management  . Coronary artery disease    sees Dr. Patty Sermons  . Dementia (HCC)   . GERD (gastroesophageal reflux disease)    hx of  . H/O hiatal hernia   . Hypercholesteremia   . Hypertension    all managed by Dr. Haynes Dage bill  . Mitral valve regurgitation    trace  . Non-traumatic compression fracture of thoracic vertebra (HCC)    T9  . Pneumonia 05/27/2017  . Stroke (HCC)   . Wears dentures   . Wears glasses     Patient Active Problem List   Diagnosis Date Noted  . Status post arthroscopy of right knee 02/22/2018  . Acute medial meniscal tear, right, sequela   . Effusion, right knee   . Acute pain of right knee 01/09/2018  . Community acquired pneumonia of right upper lobe of lung (HCC) 07/13/2017  . Generalized weakness 07/13/2017  . Anxiety and depression 04/14/2016  . Leukocytosis 03/31/2016  . Renal insufficiency 03/31/2016  . Neuropathy  02/12/2016  . AAA (abdominal aortic aneurysm) without rupture (HCC) 06/22/2015  . PCP NOTES >>>>>>>>>>>>>>>>>>>>>>>>>>>>>>>>>. 06/22/2015  . Malnutrition of moderate degree 06/05/2015  . Gait disorder 06/04/2015  . Elevated transaminase level 06/04/2015  . Pre-syncope 06/04/2015  . Closed left hip fracture (HCC) 02/07/2015  . Annual physical exam >>>>>>>>>>>> 01/22/2013  . Caregiver stress 01/22/2013  . 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 Chronic back pain 08/29/2012  . Occlusion and stenosis of carotid artery without mention of cerebral infarction 05/09/2012  . Carotid stenosis, left 04/13/2012  . 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 CVA (cerebral infarction) 04/11/2012  . Arthritis 08/05/2010  . Tobacco abuse 08/05/2010  . Mitral valve regurgitation   . Hypertension   . Hypercholesteremia   . Coronary artery disease     Past Surgical History:  Procedure Laterality Date  . BACK SURGERY     x 3 Dr Juliene Pina, last @ [facility] ~ 2005, rods, fusion, four surgery  . CARDIAC CATHETERIZATION  1994  . CAROTID ENDARTERECTOMY    . CARPAL TUNNEL RELEASE     left  . CHOLECYSTECTOMY    . ENDARTERECTOMY  04/24/2012   Procedure: ENDARTERECTOMY CAROTID;  Surgeon: Larina Earthly, MD;  Location: [facility] OR;  Service: Vascular;  Laterality: Left;  . HAND SURGERY  bilateral  . HIP FRACTURE SURGERY Left   . HIP PINNING,CANNULATED Left 02/08/2015   Procedure: CANNULATED HIP PINNING;  Surgeon: Kathryne Hitchhristopher Y Blackman, MD;  Location: [facility] ORS;  Service: Orthopedics;  Laterality: Left;  . HIP PINNING,CANNULATED Right 03/31/2016   Procedure: CANNULATED HIP PINNING;  Surgeon: Kathryne Hitchhristopher Y Blackman, MD;  Location: [facility] ORS;  Service: Orthopedics;  Laterality: Right;  . HYSTEROTOMY    . KNEE ARTHROSCOPY Right 02/14/2018   Procedure: RIGHT KNEE ARTHROSCOPY WITH POSSIBLE PARTIAL MENISCECTOMY;  Surgeon: Kathryne HitchBlackman, Christopher Y, MD;  Location: [facility] OR;  Service: Orthopedics;  Laterality:  Right;  . MULTIPLE TOOTH EXTRACTIONS    . SPINAL CORD STIMULATOR INSERTION    . TEE WITHOUT CARDIOVERSION  04/12/2012   Procedure: TRANSESOPHAGEAL ECHOCARDIOGRAM (TEE);  Surgeon: Lewayne BuntingBrian S Crenshaw, MD;  [facility];  Service: Cardiovascular;  Laterality: N/A;  . TONSILLECTOMY       OB History   No obstetric history on file.      Home Medications    Prior to Admission medications   Medication Sig Start Date End Date Taking? Authorizing Provider  amLODipine (NORVASC) 10 MG tablet Take 1 tablet (10 mg total) by mouth daily. 05/03/18  Yes Wanda PlumpPaz, Jose E, MD  aspirin EC 81 MG tablet Take 81 mg by mouth daily.   Yes [provider]  atorvastatin (LIPITOR) 40 MG tablet Take 0.5 tablets (20 mg total) by mouth daily. 05/24/17  Yes Paz, Nolon RodJose E, MD  diazepam (VALIUM) 5 MG tablet TAKE 1 TABLET BY MOUTH EVERY DAY AS NEEDED 05/18/18  Yes Wanda PlumpPaz, Jose E, MD  diphenhydrAMINE (BENADRYL) 25 mg capsule Take 25 mg by mouth every 6 (six) hours as needed (runny nose).   Yes [provider]  donepezil (ARICEPT) 10 MG tablet Take 1 tablet (10 mg total) by mouth at bedtime. 05/03/18  Yes Paz, Nolon RodJose E, MD  gabapentin (NEURONTIN) 300 MG capsule Take 1 capsule by mouth every morning and 2 capsules by mouth every evening at bedtime Patient taking differently: Take 300-600 mg by mouth See admin instructions. Take 1 capsule (300 mg)  by mouth every morning and 2 capsules (600 mg) by mouth every evening at bedtime 01/23/18  Yes Wanda PlumpPaz, Jose E, MD  lidocaine-prilocaine (EMLA) cream  05/09/18  Yes [provider]  metoprolol tartrate (LOPRESSOR) 50 MG tablet Take 0.5 tablets (25 mg total) by mouth 2 (two) times daily. (BETA BLOCKER) 05/03/18  Yes Paz, Nolon RodJose E, MD  omeprazole (PRILOSEC) 20 MG capsule Take 1 capsule (20 mg total) by mouth daily. 01/23/18  Yes Paz, Nolon RodJose E, MD  oxyCODONE-acetaminophen (PERCOCET) 7.5-325 MG tablet Take 1 tablet by mouth every 6 (six) hours as needed for moderate pain or severe  pain. 02/14/18  Yes Kathryne HitchBlackman, Christopher Y, MD  alendronate (FOSAMAX) 70 MG tablet Take 1 tablet (70 mg total) by mouth once a week. Take with a full glass of water on an empty stomach. Patient not taking: Reported on 06/06/2018 09/26/17   Wanda PlumpPaz, Jose E, MD  doxycycline (VIBRA-TABS) 100 MG tablet Take 1 tablet (100 mg total) by mouth 2 (two) times daily. Patient not taking: Reported on 06/06/2018 05/05/18   Wanda PlumpPaz, Jose E, MD  [facility] 40 MG T24A Take 40 mg by mouth at bedtime.  09/16/16   [provider]  vitamin B-12 (CYANOCOBALAMIN) 1000 MCG tablet Take 1,000 mcg by mouth daily.    [provider]    Family History Family History  Problem Relation Age of Onset  .  Heart attack Father   . Heart disease Father        before age 80  . Heart disease Sister   . Cancer Sister   . Heart disease Sister     Social History Social History   Tobacco Use  . Smoking status: Former Smoker    Packs/day: 0.50    Years: 60.00    Pack years: 30.00    Types: Cigarettes  . Smokeless tobacco: Never Used  . Tobacco comment: quit 04-2015  Substance Use Topics  . Alcohol use: No    Alcohol/week: 0.0 standard drinks  . Drug use: No     Allergies   Penicillins; Morphine and related; Percocet [oxycodone-acetaminophen]; Hydrochlorothiazide; Indomethacin; Paroxetine hcl; Pravachol; Quinine derivatives; Wellbutrin [bupropion]; and Zocor [simvastatin - high dose]   Review of Systems Review of Systems  All other systems reviewed and are negative.    Physical Exam Updated Vital Signs BP (!) 139/58 (BP Location: Right Arm)   Pulse 60   Temp 97.8 F (36.6 C) (Oral)   Resp 11   Ht 5\' 5"  (1.651 m)   Wt 63.5 kg   SpO2 95%   BMI 23.30 kg/m   Physical Exam Vitals signs and nursing note reviewed.  Constitutional:      General: She is not in acute distress.    Appearance: She is well-developed and normal weight. She is not ill-appearing, toxic-appearing or diaphoretic.  HENT:      Head: Normocephalic and atraumatic.     Right Ear: External ear normal.     Left Ear: External ear normal.     Nose: Nose normal.     Mouth/Throat:     Mouth: Mucous membranes are moist.  Eyes:     Conjunctiva/sclera: Conjunctivae normal.     Pupils: Pupils are equal, round, and reactive to light.  Neck:     Musculoskeletal: Normal range of motion and neck supple.     Trachea: Phonation normal.  Cardiovascular:     Rate and Rhythm: Normal rate and regular rhythm.     Heart sounds: Normal heart sounds.  Pulmonary:     Effort: Pulmonary effort is normal.     Breath sounds: Normal breath sounds.  Abdominal:     Palpations: Abdomen is soft.     Tenderness: There is no abdominal tenderness.  Musculoskeletal: Normal range of motion.  Skin:    General: Skin is warm and dry.  Neurological:     Mental Status: She is alert and oriented to person, place, and time.     Cranial Nerves: No cranial nerve deficit.     Sensory: No sensory deficit.     Motor: No weakness or abnormal muscle tone.     Coordination: Coordination normal.     Comments: No dysarthria, aphasia or nystagmus.  No ataxia.  Normal finger-to-nose and heel-to-shin, bilaterally.  Psychiatric:        Mood and Affect: Mood normal.        Behavior: Behavior normal.        Thought Content: Thought content normal.        Judgment: Judgment normal.      ED Treatments / Results  Labs (all labs ordered are listed, but only abnormal results are displayed) Labs Reviewed  BASIC METABOLIC PANEL - Abnormal; Notable for the following components:      Result Value   Glucose, Bld 131 (*)    BUN 29 (*)    Creatinine, Ser 1.03 (*)  GFR calc non Af Amer 51 (*)    GFR calc Af Amer 59 (*)    All other components within normal limits  CBC  URINALYSIS, ROUTINE W REFLEX MICROSCOPIC    EKG EKG Interpretation  Date/Time:  Tuesday June 06 2018 13:27:37 EST Ventricular Rate:  58 PR Interval:    QRS Duration: 87 QT  Interval:  462 QTC Calculation: 454 R Axis:   106 Text Interpretation:  Sinus rhythm Right axis deviation RSR' in V1 or V2, probably normal variant Baseline wander in lead(s) V2 since last tracing no significant change Confirmed by Mancel Bale [phone]) on 06/06/2018 4:04:34 PM   Radiology Ct Head Wo Contrast  Result Date: 06/06/2018 CLINICAL DATA:  Headache, dizziness. EXAM: CT HEAD WITHOUT CONTRAST TECHNIQUE: Contiguous axial images were obtained from the base of the skull through the vertex without intravenous contrast. COMPARISON:  CT scan of March 31, 2016. FINDINGS: Brain: Mild diffuse cortical atrophy is noted. Mild chronic ischemic white matter disease is noted. No mass effect or midline shift is noted. Ventricular size is within normal limits. There is no evidence of mass lesion, hemorrhage or acute infarction. Vascular: No hyperdense vessel or unexpected calcification. Skull: Normal. Negative for fracture or focal lesion. Sinuses/Orbits: No acute finding. Other: None. IMPRESSION: Mild diffuse cortical atrophy. Mild chronic ischemic white matter disease. No acute intracranial abnormality seen. Electronically Signed   By: Lupita Raider, M.D.   On: 06/06/2018 15:35    Procedures Procedures (including critical care time)  Medications Ordered in ED Medications - No data to display   Initial Impression / Assessment and Plan / ED Course  I have reviewed the triage vital signs and the nursing notes.  Pertinent labs & imaging results that were available during my care of the patient were reviewed by me and considered in my medical decision making (see chart for details).  Clinical Course as of Jun 06 1628  Tue Jun 06, 2018  1604 Normal except glucose high, BUN high, creatinine high, GFR low  Basic metabolic panel(!) [EW]  1604 Normal  CBC [EW]  1604 Obtained, not yet resulted.  Urinalysis, Routine w reflex microscopic [EW]  1606 No CVA or mass, images reviewed by me  CT Head Wo  Contrast [EW]  1629 Normal  Urinalysis, Routine w reflex microscopic [EW]    Clinical Course User Index [EW] Mancel Bale, MD     Patient Vitals for the past 24 hrs:  BP Temp Temp src Pulse Resp SpO2 Height Weight  06/06/18 1604 (!) 139/58 - - 60 11 95 % - -  06/06/18 1500 (!) 150/51 - - 60 12 96 % - -  06/06/18 1430 (!) 171/68 - - - 14 - - -  06/06/18 1420 (!) 152/72 - - 60 16 96 % - -  06/06/18 1327 (!) 171/67 97.8 F (36.6 C) Oral (!) 58 16 96 % - -  06/06/18 1323 - - - - - - 5\' 5"  (1.651 m) 63.5 kg    4:05 PM Reevaluation with update and discussion. After initial assessment and treatment, an updated evaluation reveals patient is comfortable now.  Currently blood pressure is 139/58.  Findings discussed with patient family members, all questions answered. Mancel Bale   Medical Decision Making: Nonspecific headache, reassuring evaluation.  No evidence for CVA, hypertensive urgency or impending vascular collapse.  CRITICAL CARE- No Performed by: Mancel Bale  Nursing Notes Reviewed/ Care Coordinated Applicable Imaging Reviewed Interpretation of Laboratory Data incorporated into ED treatment  The patient appears reasonably screened and/or stabilized for discharge and I doubt any other medical condition or other [facility] requiring further screening, evaluation, or treatment in the ED at this time prior to discharge.  Plan: Home Medications-continue usual medications, Tylenol if needed for headache; Home Treatments-DASH diet, plenty of fluids with low sodium; return here if the recommended treatment, does not improve the symptoms; Recommended follow up-PCP checkup 1 week and as needed      Final Clinical Impressions(s) / ED Diagnoses   Final diagnoses:  Hypertension, unspecified type    ED Discharge Orders    None       Mancel Bale, MD 06/06/18 1640

## 2018-06-06 NOTE — Telephone Encounter (Signed)
Pt called to report that her BP is running very high today. 212/111, 216/116 and 223/99.  She states that it was high all day yesterday.  She states she has not missed any doses of her BP medication. She states that she is feeling dizzy and has a headache. Per protocol pt will go to ER for evaluation of her symptoms.  Care advice rad to patient. Pt verbalized understaning of all instructions.  Pt states she has someone who will drive her to the ER.  Reason for Disposition . [1] Systolic BP  >= 160 OR Diastolic >= 100 AND [2] cardiac or neurologic symptoms (e.g., chest pain, difficulty breathing, unsteady gait, blurred vision)  Answer Assessment - Initial Assessment Questions 1. BLOOD PRESSURE: "What is the blood pressure?" "Did you take at least two measurements 5 minutes apart?"     212/111  216/116 223/99 2. ONSET: "When did you take your blood pressure?"    yesterday 3. HOW: "How did you obtain the blood pressure?" (e.g., visiting nurse, automatic home BP monitor)     Home machine 4. HISTORY: "Do you have a history of high blood pressure?"     High BP 5. MEDICATIONS: "Are you taking any medications for blood pressure?" "Have you missed any doses recently?"     no 6. OTHER SYMPTOMS: "Do you have any symptoms?" (e.g., headache, chest pain, blurred vision, difficulty breathing, weakness)     Dizzy headache 7. PREGNANCY: "Is there any chance you are pregnant?" "When was your last menstrual period?"    N/A  Protocols used: HIGH BLOOD PRESSURE-A-AH

## 2018-06-06 NOTE — Discharge Instructions (Signed)
The test today were normal.  Make sure you are drinking plenty fluids, especially water and stay on a low-salt diet diet.  Use the attached information for DASH diet.  See your PCP for checkup on your blood pressure in 1 week and as needed.

## 2018-06-06 NOTE — ED Triage Notes (Addendum)
Patient is complaining of hypertension (212/111) this morning), bilateral temple headache, and dizziness since yesterday. Patient reports she normally does not check her BP, but started experiencing symptoms yesterday of dizziness. No relieving or aggravating symptoms. Denies missing or skipping a dose of her BP medication. Patients daughter also reports patient had a fall on Sunday night, but denies hitting her head. Patient denies having any symptoms and slip/tripped.

## 2018-06-06 NOTE — Telephone Encounter (Signed)
This should had been a DOD call. I will route this message to Dr. Anne Fu high priority.

## 2018-06-06 NOTE — Telephone Encounter (Signed)
° ° °  Dr Drue Novel calling, requesting Dr Anne Fu respond to a MyChart message he is sending him. If Dr Anne Fu has questions he can call Dr Drue Novel at [phone]

## 2018-06-06 NOTE — Progress Notes (Signed)
a 

## 2018-06-07 ENCOUNTER — Telehealth: Payer: Self-pay | Admitting: Internal Medicine

## 2018-06-07 NOTE — Telephone Encounter (Signed)
Spoke w/ Pt- informed of PCP recommendations. Instructed to write BPs on log. ED f/u appt scheduled 06/14/2018 at 2pm.

## 2018-06-07 NOTE — Telephone Encounter (Signed)
Patient went to the ER with elevated BP. Please advise pt to check BP daily at least, come back in a week with a BP log. If she has problems/symptoms let me know.

## 2018-06-08 NOTE — Telephone Encounter (Signed)
I spoke to Dr. Drue Novel regarding the situation. Donato Schultz, MD

## 2018-06-14 ENCOUNTER — Telehealth: Payer: Self-pay

## 2018-06-14 ENCOUNTER — Ambulatory Visit (INDEPENDENT_AMBULATORY_CARE_PROVIDER_SITE_OTHER): Payer: Medicare Other | Admitting: Internal Medicine

## 2018-06-14 ENCOUNTER — Encounter: Payer: Self-pay | Admitting: Internal Medicine

## 2018-06-14 VITALS — BP 116/68 | HR 49 | Temp 97.9°F | Resp 18 | Ht 65.0 in | Wt 138.4 lb

## 2018-06-14 DIAGNOSIS — I10 Essential (primary) hypertension: Secondary | ICD-10-CM | POA: Diagnosis not present

## 2018-06-14 DIAGNOSIS — F419 Anxiety disorder, unspecified: Secondary | ICD-10-CM | POA: Diagnosis not present

## 2018-06-14 DIAGNOSIS — F329 Major depressive disorder, single episode, unspecified: Secondary | ICD-10-CM | POA: Diagnosis not present

## 2018-06-14 DIAGNOSIS — F0390 Unspecified dementia without behavioral disturbance: Secondary | ICD-10-CM

## 2018-06-14 DIAGNOSIS — F32A Depression, unspecified: Secondary | ICD-10-CM

## 2018-06-14 MED ORDER — ESCITALOPRAM OXALATE 10 MG PO TABS: ORAL_TABLET | ORAL | 3 refills | Status: DC

## 2018-06-14 NOTE — Patient Instructions (Addendum)
  GO TO THE FRONT DESK Schedule your next appointment for a check up in 6 weeks       Start taking Lexapro or escitalopram 10 mg tablet: The first 2 weeks take only half tablet at bedtime Then take 1 tablet at bedtime.  Check the  blood pressure 1 or 2 times a day Be sure your blood pressure is between 110/65 and  150/85. If it is consistently higher or lower, let me know   HOW TO TAKE YOUR BLOOD PRESSURE:   Rest 5 minutes before taking your blood pressure.   Don't smoke or drink caffeinated beverages for at least 30 minutes before.   Take your blood pressure before (not after) you eat.   Sit comfortably with your back supported and both feet on the floor (don't cross your legs).   Elevate your arm to heart level on a table or a desk.   Use the proper sized cuff. It should fit smoothly and snugly around your bare upper arm. There should be enough room to slip a fingertip under the cuff. The bottom edge of the cuff should be 1 inch above the crease of the elbow.   Ideally, take 3 measurements at one sitting and record the average.

## 2018-06-14 NOTE — Telephone Encounter (Signed)
Can you initiate initial Prolia benefits?

## 2018-06-14 NOTE — Progress Notes (Signed)
Pre visit review using our clinic review tool, if applicable. No additional management support is needed unless otherwise documented below in the visit note. 

## 2018-06-14 NOTE — Progress Notes (Signed)
Subjective:    Patient ID: Sara [name], female    DOB: 12/14/1940, 78 y.o.   MRN: [id number]  DOS:  06/14/2018 Type of visit - description: ER follow-up The patient went to the ER 06/06/2018 she reported elevated BP, headache and dizziness. BP prior to the ER visit was 212/111. Good compliance with medication, she was eating and drinking normally. At the ER, BP was 171/67 and at the end it was 139/58. BMP and CBC unremarkable, CT head no acute   Review of Systems Since he left the ER, BPs range from 115/74-170/80. Denies headache or dizziness Good med compliance  Other issues: Declines to take Fosamax. Memory is a slightly worse according to the daughter who is here, she also reports that the patient has been depressed for few weeks, frequent crying. The reason with depression is that she cannot do much, mostly because she continue with right knee pain.  Unable to drive. Denies suicidal ideas.  Past Medical History:  Diagnosis Date  . Anxiety   . Arthritis   . Bronchitis    hx of  . Carotid artery occlusion   . Chronic pain syndrome    pain management  . Coronary artery disease    sees Dr. Patty SermonsBrackbill  . Dementia (HCC)   . GERD (gastroesophageal reflux disease)    hx of  . H/O hiatal hernia   . Hypercholesteremia   . Hypertension    all managed by Dr. Haynes DageBrack bill  . Mitral valve regurgitation    trace  . Non-traumatic compression fracture of thoracic vertebra (HCC)    T9  . Pneumonia 05/27/2017  . Stroke (HCC)   . Wears dentures   . Wears glasses     Past Surgical History:  Procedure Laterality Date  . BACK SURGERY     x 3 Dr Juliene PinaGeoffrey, [facility], rods, fusion, four surgery  . CARDIAC CATHETERIZATION  1994  . CAROTID ENDARTERECTOMY    . CARPAL TUNNEL RELEASE     left  . CHOLECYSTECTOMY    . ENDARTERECTOMY  04/24/2012   Procedure: ENDARTERECTOMY CAROTID;  Surgeon: Larina Earthlyodd F Early, MD;  [facility];  Service: Vascular;  Laterality: Left;  .  HAND SURGERY     bilateral  . HIP FRACTURE SURGERY Left   . HIP PINNING,CANNULATED Left 02/08/2015   Procedure: CANNULATED HIP PINNING;  Surgeon: Kathryne Hitchhristopher Y Blackman, MD;  Location: [facility] ORS;  Service: Orthopedics;  Laterality: Left;  . HIP PINNING,CANNULATED Right 03/31/2016   Procedure: CANNULATED HIP PINNING;  Surgeon: Kathryne Hitchhristopher Y Blackman, MD;  Location: [facility] ORS;  Service: Orthopedics;  Laterality: Right;  . HYSTEROTOMY    . KNEE ARTHROSCOPY Right 02/14/2018   Procedure: RIGHT KNEE ARTHROSCOPY WITH POSSIBLE PARTIAL MENISCECTOMY;  Surgeon: Kathryne HitchBlackman, Christopher Y, MD;  Location: [facility] OR;  Service: Orthopedics;  Laterality: Right;  . MULTIPLE TOOTH EXTRACTIONS    . SPINAL CORD STIMULATOR INSERTION    . TEE WITHOUT CARDIOVERSION  04/12/2012   Procedure: TRANSESOPHAGEAL ECHOCARDIOGRAM (TEE);  Surgeon: Lewayne BuntingBrian S Crenshaw, MD;  [facility];  Service: Cardiovascular;  Laterality: N/A;  . TONSILLECTOMY      Social History   Socioeconomic History  . Marital status: Married    Spouse name: Not on file  . Number of children: 4  . Years of education: Not on file  . Highest education level: Not on file  Occupational History  . Occupation: retired   Engineer, productionocial Needs  . Financial resource strain: Not on file  .  Food insecurity:    Worry: Not on file    Inability: Not on file  . Transportation needs:    Medical: Not on file    Non-medical: Not on file  Tobacco Use  . Smoking status: Former Smoker    Packs/day: 0.50    Years: 60.00    Pack years: 30.00    Types: Cigarettes  . Smokeless tobacco: Never Used  . Tobacco comment: quit 04-2015  Substance and Sexual Activity  . Alcohol use: No    Alcohol/week: 0.0 standard drinks  . Drug use: No  . Sexual activity: Not Currently  Lifestyle  . Physical activity:    Days per week: Not on file    Minutes per session: Not on file  . Stress: Not on file  Relationships  . Social connections:    Talks on phone: Not on file    Gets  together: Not on file    Attends religious service: Not on file    Active member of club or organization: Not on file    Attends meetings of clubs or organizations: Not on file    Relationship status: Not on file  . Intimate partner violence:    Fear of current or ex partner: Not on file    Emotionally abused: Not on file    Physically abused: Not on file    Forced sexual activity: Not on file  Other Topics Concern  . Not on file  Social History Narrative   Lives w/ husband in a one story home.     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   Education: high school      Allergies as of 06/14/2018      Reactions   Penicillins Other (See Comments)   Has patient had a PCN reaction causing immediate rash, facial/tongue/throat swelling, SOB or lightheadedness with hypotension: # # # Yes # # # Has patient had a PCN reaction causing severe rash involving mucus membranes or skin necrosis: No Has patient had a PCN reaction that required hospitalization No Has patient had a PCN reaction occurring within the last 10 years: No If all of the above answers are "NO", then may proceed with Cephalosporin use.   Morphine And Related Rash   Percocet [oxycodone-acetaminophen] Rash   Tolerates APAP (including IV), morphine, and Hydrocodone/APAP   Hydrochlorothiazide Other (See Comments)   Pt does not remember   Indomethacin    Pt does not remember   Paroxetine Hcl    Pt does not remember   Pravachol Other (See Comments)   Pt does not remember   Quinine Derivatives    Pt does not remember   Wellbutrin [bupropion]    agitation   Zocor [simvastatin - High Dose] Other (See Comments)   Weakness/no energy      Medication List       Accurate as of June 14, 2018 11:59 PM. Always use your most recent med list.        amLODipine 10 MG tablet Commonly known as:  NORVASC Take 1 tablet (10 mg total) by mouth daily.   aspirin EC 81 MG tablet Take 81 mg by mouth  daily.   atorvastatin 40 MG tablet Commonly known as:  LIPITOR Take 0.5 tablets (20 mg total) by mouth daily.   diazepam 5 MG tablet Commonly known as:  VALIUM TAKE 1 TABLET BY MOUTH EVERY DAY AS NEEDED   diphenhydrAMINE 25 mg  capsule Commonly known as:  BENADRYL Take 25 mg by mouth every 6 (six) hours as needed (runny nose).   donepezil 10 MG tablet Commonly known as:  ARICEPT Take 1 tablet (10 mg total) by mouth at bedtime.   escitalopram 10 MG tablet Commonly known as:  LEXAPRO Half tablet at bedtime for 2 weeks, then 1 tablet at bedtime   gabapentin 300 MG capsule Commonly known as:  NEURONTIN Take 1 capsule by mouth every morning and 2 capsules by mouth every evening at bedtime   HYSINGLA ER 40 MG T24a Generic drug:  HYDROcodone Bitartrate ER Take 40 mg by mouth at bedtime.   lidocaine-prilocaine cream Commonly known as:  EMLA   metoprolol tartrate 50 MG tablet Commonly known as:  LOPRESSOR Take 0.5 tablets (25 mg total) by mouth 2 (two) times daily. (BETA BLOCKER)   omeprazole 20 MG capsule Commonly known as:  PRILOSEC Take 1 capsule (20 mg total) by mouth daily.   oxyCODONE-acetaminophen 7.5-325 MG tablet Commonly known as:  PERCOCET Take 1 tablet by mouth every 6 (six) hours as needed for moderate pain or severe pain.   vitamin B-12 1000 MCG tablet Commonly known as:  CYANOCOBALAMIN Take 1,000 mcg by mouth daily.           Objective:   Physical Exam BP 116/68 (BP Location: Left Arm, Patient Position: Sitting, Cuff Size: Small)   Pulse (!) 49   Temp 97.9 F (36.6 C) (Oral)   Resp 18   Ht 5\' 5"  (1.651 m)   Wt 138 lb 6 oz (62.8 kg)   SpO2 95%   BMI 23.03 kg/m  General:   Well developed, some emotional distress when we talk about her depression HEENT:  Normocephalic . Face symmetric, atraumatic Lungs:  CTA B Normal respiratory effort, no intercostal retractions, no accessory muscle use. Heart: RRR,  no murmur.  No pretibial edema  bilaterally  Skin: Not pale. Not jaundice Neurologic:  alert & oriented X3.  Speech normal, gait not tested, sitting in a wheelchair Psych--  Cognition and judgment appear intact.  Cooperative with normal attention span and concentration.  Behavior appropriate. 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 Depression started Lexapro around 04-2011 Mild Dementia CV --CAD  --H/o Stroke 2013:non hemorrhagic infarcts scattered throughout the [place] Hemisphere . 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   MSK: -Osteoporosis  last DEXA -hip fx 01-2015 -Vertebral FX (s/p v-plasty) - R Hip Fx 03-2016 -Chronic back pain , s/p spinal cord stimulator  Dr Jordan Likes   Abnormal CT abd/pelvis  2017: See full report, some of the findings>> Atherosclerotic ulcer left subclavian T 8 compression fracture Infrarenal abdominal aortic aneurysm 3.5 cm, follow-up 2 years Narrow  left renal artery , SMA occluded with pancreatic collaterals Mild mesenteric adenopathy, possibly stable, consider 2 year follow-up  R distal ureter enhancement per CT: Saw urology 07-2015, Rx a ultrasound   R adrenal adenoma Smoker , quit ~ 04-2015  Urosepsis 05-2015   PLAN: HTN: Currently on amlodipine 10 mg and metoprolol 25 mg twice a day.  BP was elevated at the ER, unclear reason.  Now BPs range from 115-170. Recommend  to continue with the same meds, only adjust therapy if ambulatory BPs are consistently more than 150, we need to prevent hypotension.  See AVS. Osteoporosis: Patient declines to take Fosamax, pros and cons of her decision discussed, eventually we agreed on trying Prolia which  last year was rejected by her insurance. Dementia: Slightly worse, see next Depression: Started few weeks ago, patient is quite depressed and frustrated due to her general health, she is unable to drive or do much at home.  I counseled to the best of my ability, ? Medication, we both agreed that meds will benefit her; Paxil is listed as a "allergy" but no specific reaction is stated.  I will start Lexapro 5 mg daily then increase to 10 mg. RTC 6 weeks.  F2F > 32 min

## 2018-06-15 DIAGNOSIS — F0390 Unspecified dementia without behavioral disturbance: Secondary | ICD-10-CM | POA: Insufficient documentation

## 2018-06-15 NOTE — Assessment & Plan Note (Signed)
HTN: Currently on amlodipine 10 mg and metoprolol 25 mg twice a day.  BP was elevated at the ER, unclear reason.  Now BPs range from 115-170. Recommend to continue with the same meds, only adjust therapy if ambulatory BPs are consistently more than 150, we need to prevent hypotension.  See AVS. Osteoporosis: Patient declines to take Fosamax, pros and cons of her decision discussed, eventually we agreed on trying Prolia which last year was rejected by her insurance. Dementia: Slightly worse, see next Depression: Started few weeks ago, patient is quite depressed and frustrated due to her general health, she is unable to drive or do much at home.  I counseled to the best of my ability, ? Medication, we both agreed that meds will benefit her; Paxil is listed as a "allergy" but no specific reaction is stated.  I will start Lexapro 5 mg daily then increase to 10 mg. RTC 6 weeks.

## 2018-06-16 ENCOUNTER — Telehealth (INDEPENDENT_AMBULATORY_CARE_PROVIDER_SITE_OTHER): Payer: Self-pay | Admitting: Orthopaedic Surgery

## 2018-06-16 NOTE — Telephone Encounter (Signed)
[name], patient's daughter called and stated that they were told patient was to get OP PT. They have not heard from anyone.  Please call [name] to advise when and who they are to hear from. No referral in system.  [phone]

## 2018-06-16 NOTE — Telephone Encounter (Signed)
Verification ran, Waiting on summary of benefits.

## 2018-06-19 ENCOUNTER — Other Ambulatory Visit (INDEPENDENT_AMBULATORY_CARE_PROVIDER_SITE_OTHER): Payer: Self-pay

## 2018-06-19 DIAGNOSIS — Z96651 Presence of right artificial knee joint: Secondary | ICD-10-CM

## 2018-06-19 NOTE — Telephone Encounter (Signed)
Sent order.

## 2018-06-20 ENCOUNTER — Telehealth: Payer: Self-pay

## 2018-06-20 MED ORDER — FLUOXETINE HCL 20 MG PO TABS: ORAL_TABLET | ORAL | 1 refills | Status: DC

## 2018-06-20 NOTE — Telephone Encounter (Signed)
Copied from CRM [id number]. Topic: General - Other >> Jun 20, 2018  1:28 PM Elliot Gault wrote: Relation to pt: self  Call back number: [phone] Pharmacy: [id number] - [address], [place] - [address] [facility] [address] [phone] (Phone) [phone] (Fax)  Reason for call:  Patient states escitalopram (LEXAPRO) 10 MG tablet is making her weak and would like PCP to prescribe alternate, please advise

## 2018-06-20 NOTE — Telephone Encounter (Signed)
Spoke w/ Pt- informed of recommendations. Rx sent to [facility]. Pt verbalized understanding.  °

## 2018-06-20 NOTE — Telephone Encounter (Signed)
Advise patient: Stop Lexapro Send Prozac 20 mg: Half tablet daily x1 week, then 1 tablet daily #30 and 1 refill

## 2018-06-20 NOTE — Telephone Encounter (Signed)
Please advise 

## 2018-06-26 ENCOUNTER — Other Ambulatory Visit: Payer: Self-pay | Admitting: Internal Medicine

## 2018-06-29 ENCOUNTER — Encounter: Payer: Self-pay | Admitting: Physical Therapy

## 2018-06-29 ENCOUNTER — Ambulatory Visit: Payer: Medicare Other | Attending: Orthopaedic Surgery | Admitting: Physical Therapy

## 2018-06-29 DIAGNOSIS — G8929 Other chronic pain: Secondary | ICD-10-CM | POA: Insufficient documentation

## 2018-06-29 DIAGNOSIS — M25561 Pain in right knee: Secondary | ICD-10-CM | POA: Insufficient documentation

## 2018-06-29 DIAGNOSIS — M6281 Muscle weakness (generalized): Secondary | ICD-10-CM | POA: Diagnosis present

## 2018-06-29 NOTE — Therapy (Signed)
[facility] [address] [place], [place], [address] Phone: [phone]   Fax:  [phone]  Physical Therapy Evaluation  Patient Details  Name: Sara [name] MRN: [id number] Date of Birth: 1940/08/28 Referring Provider (PT): christopher Magnus Ivan   Encounter Date: 06/29/2018  PT End of Session - 06/29/18 1346    Visit Number  1    Date for PT Re-Evaluation  08/24/18    PT Start Time  1347    PT Stop Time  1426    PT Time Calculation (min)  39 min    Activity Tolerance  Patient tolerated treatment well    Behavior During Therapy  [facility] for tasks assessed/performed       Past Medical History:  Diagnosis Date  . Anxiety   . Arthritis   . Bronchitis    hx of  . Carotid artery occlusion   . Chronic pain syndrome    pain management  . Coronary artery disease    sees Dr. Patty Sermons  . Dementia (HCC)   . GERD (gastroesophageal reflux disease)    hx of  . H/O hiatal hernia   . Hypercholesteremia   . Hypertension    all managed by Dr. Haynes Dage bill  . Mitral valve regurgitation    trace  . Non-traumatic compression fracture of thoracic vertebra (HCC)    T9  . Pneumonia 05/27/2017  . Stroke (HCC)   . Wears dentures   . Wears glasses     Past Surgical History:  Procedure Laterality Date  . BACK SURGERY     x 3 Dr Juliene Pina, last @ [facility] ~ 2005, rods, fusion, four surgery  . CARDIAC CATHETERIZATION  1994  . CAROTID ENDARTERECTOMY    . CARPAL TUNNEL RELEASE     left  . CHOLECYSTECTOMY    . ENDARTERECTOMY  04/24/2012   Procedure: ENDARTERECTOMY CAROTID;  Surgeon: Larina Earthly, MD;  Location: [facility] OR;  Service: Vascular;  Laterality: Left;  . HAND SURGERY     bilateral  . HIP FRACTURE SURGERY Left   . HIP PINNING,CANNULATED Left 02/08/2015   Procedure: CANNULATED HIP PINNING;  Surgeon: Kathryne Hitch, MD;  Location: [facility] ORS;  Service: Orthopedics;  Laterality: Left;  . HIP PINNING,CANNULATED Right  03/31/2016   Procedure: CANNULATED HIP PINNING;  Surgeon: Kathryne Hitch, MD;  Location: [facility] ORS;  Service: Orthopedics;  Laterality: Right;  . HYSTEROTOMY    . KNEE ARTHROSCOPY Right 02/14/2018   Procedure: RIGHT KNEE ARTHROSCOPY WITH POSSIBLE PARTIAL MENISCECTOMY;  Surgeon: Kathryne Hitch, MD;  Location: [facility] OR;  Service: Orthopedics;  Laterality: Right;  . MULTIPLE TOOTH EXTRACTIONS    . SPINAL CORD STIMULATOR INSERTION    . TEE WITHOUT CARDIOVERSION  04/12/2012   Procedure: TRANSESOPHAGEAL ECHOCARDIOGRAM (TEE);  Surgeon: Lewayne Bunting, MD;  Location: [facility] ENDOSCOPY;  Service: Cardiovascular;  Laterality: N/A;  . TONSILLECTOMY      There were no vitals filed for this visit.   Subjective Assessment - 06/29/18 1352    Subjective  Patient had arthroscopy on her right knee 02/14/18. Ever since then she has had fluid drained from the knee monthly and cortisone shots as well. She says her knee is popping out of place. She indicates her knee giving way medially. this occurs daily whenever she is walking. She uses a SPC more than the walker at this time. She has had one fall since the surgery where she stumbled. She wears a knee brace and also  wears an ankle brace for stability to prevent dragging her foot.     Pertinent History  Bil hip fx with pins, Rt knee arthroscopy, CVA 2017, osteoporosis, 5 back surgeries and back stimulator, anxiety, depression    Patient Stated Goals  to get stronger and be comfortable walking and getting out a little bit    Currently in Pain?  Yes    Pain Score  4     Pain Location  Knee    Pain Orientation  Right;Medial    Pain Descriptors / Indicators  Aching    Pain Type  Chronic pain;Acute pain    Pain Onset  More than a month ago    Pain Frequency  Intermittent    Aggravating Factors   walking and standing    Pain Relieving Factors  rest    Effect of Pain on Daily Activities  limited with ambulation and activity          [facility] PT Assessment  - 06/29/18 0001      Assessment   Medical Diagnosis  s/p right knee arthroscopy    Referring Provider (PT)  christopher Magnus Ivan    Onset Date/Surgical Date  02/14/18    Prior Therapy  no      Precautions   Precautions  Fall      Balance Screen   Has the patient fallen in the past 6 months  Yes    How many times?  1    Has the patient had a decrease in activity level because of a fear of falling?   Yes    Is the patient reluctant to leave their home because of a fear of falling?   Yes      Home Environment   Living Environment  Private residence    Living Arrangements  Spouse/significant other    Home Access  Stairs to enter   townhouse small spread out steps   Entrance Stairs-Rails  Can reach both    Home Layout  One level    Home Equipment  Walker - 2 wheels;Cane - single point      Prior Function   Level of Independence  Requires assistive device for independence    Vocation  Retired      ROM / Strength   AROM / PROM / Strength  AROM;PROM;Strength      AROM   Overall AROM Comments  right knee flex 3-130      PROM   Overall PROM Comments  right knee 0-135      Strength   Overall Strength Comments  Bil hip flex 4-/5, ext 4+/5, ABD 5/5 in sitting, ADD right 4+/5, left 5/5 in sitting, Bil knee ext 4+/5, flex Rt 4-/5, left 4/5 Ankle DF 4+/5 bil      Flexibility   Soft Tissue Assessment /Muscle Length  --   tight bil HS Rt>Lt; tight right gastroc/soleus     Palpation   Palpation comment  sore along medial right knee      Ambulation/Gait   Ambulation/Gait  Yes    Ambulation/Gait Assistance  6: Modified independent (Device/Increase time)    Ambulation Distance (Feet)  20 Feet    Assistive device  Straight cane   adjusted to proper height   Gait Pattern  Step-to pattern;Decreased step length - left;Decreased stance time - right;Decreased stance time - left;Decreased step length - right;Decreased stride length;Decreased hip/knee flexion - right;Decreased dorsiflexion -  right;Decreased dorsiflexion - left;Poor foot clearance - right;Poor foot clearance - left  Ambulation Surface  Level    Gait Comments  advised to use RW for safety                Objective measurements completed on examination: See above findings.                PT Short Term Goals - 06/29/18 1544      PT SHORT TERM GOAL #1   Title  Ind with initial HEP    Time  2    Period  Weeks    Status  New    Target Date  07/13/18        PT Long Term Goals - 06/29/18 1544      PT LONG TERM GOAL #1   Title  Patient to demo improved RLE strength to 5/5 to improve ADLs and gait.    Time  8    Period  Weeks    Target Date  08/24/18      PT LONG TERM GOAL #2   Title  Patient to demo normal swing phase in gait to prevent falls.    Time  8    Period  Weeks    Status  New      PT LONG TERM GOAL #3   Title  Patient able to safely ambulate 200 feet with least restrictive AD without her knee giving way.    Time  8    Period  Weeks    Status  New      PT LONG TERM GOAL #4   Title  decreased pain in Right knee by 50% with ADLS.    Time  8    Period  Weeks    Status  New             Plan - 06/29/18 1438    Clinical Impression Statement  Patient presents with c/o right knee pain and instability since her arthroscopy on 02/14/18. She has marked hip and leg weakness affecting gait and mobility. She also has LE flexibility deficits. She presently is using a SPC for gait, but was advised to use RW at this time for safety as she has difficulty clearing her Right foot in swing phase. Patient will benefit from PT to address these difficulties.    Personal Factors and Comorbidities  Age;Comorbidity 3+;Finances    Comorbidities  Bil hip fx with pins, Rt knee arthroscopy, CVA 2017, osteoporosis, 5 back surgeries and back stimulator, anxiety, depression    Examination-Activity Limitations  Stairs;Other   walking   Examination-Participation Restrictions  Community  Activity    Stability/Clinical Decision Making  Evolving/Moderate complexity    Clinical Decision Making  Moderate    Rehab Potential  Fair    PT Frequency  Biweekly   due to high copay   PT Duration  8 weeks    PT Treatment/Interventions  ADLs/Self Care Home Management;Electrical Stimulation;Iontophoresis 4mg /ml Dexamethasone;Cryotherapy;Ultrasound;Gait training;Stair training;Therapeutic exercise;Neuromuscular re-education;Balance training;Patient/family education;Taping    PT Next Visit Plan  review HEP and progress due to high copay; add hip flexion seated/standing, 4way hip    PT Home Exercise Plan  [id number]    Consulted and Agree with Plan of Care  Patient       Patient will benefit from skilled therapeutic intervention in order to improve the following deficits and impairments:  Abnormal gait, Pain, Decreased mobility, Decreased activity tolerance, Decreased strength, Impaired flexibility  Visit Diagnosis: Chronic pain of right knee - Plan: PT plan of care cert/re-cert  Muscle weakness (  generalized) - Plan: PT plan of care cert/re-cert     Problem List Patient Active Problem List   Diagnosis Date Noted  . Dementia (HCC) 06/15/2018  . Status post arthroscopy of right knee 02/22/2018  . Acute medial meniscal tear, right, sequela   . Effusion, right knee   . Acute pain of right knee 01/09/2018  . Community acquired pneumonia of right upper lobe of lung (HCC) 07/13/2017  . Generalized weakness 07/13/2017  . Anxiety and depression 04/14/2016  . Leukocytosis 03/31/2016  . Renal insufficiency 03/31/2016  . Neuropathy 02/12/2016  . AAA (abdominal aortic aneurysm) without rupture (HCC) 06/22/2015  . PCP NOTES >>>>>>>>>>>>>>>>>>>>>>>>>>>>>>>>>. 06/22/2015  . Malnutrition of moderate degree 06/05/2015  . Gait disorder 06/04/2015  . Elevated transaminase level 06/04/2015  . Pre-syncope 06/04/2015  . Closed left hip fracture (HCC) 02/07/2015  . Annual physical exam >>>>>>>>>>>>  01/22/2013  . Caregiver stress 01/22/2013  . 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 Chronic back pain 08/29/2012  . Occlusion and stenosis of carotid artery without mention of cerebral infarction 05/09/2012  . Carotid stenosis, left 04/13/2012  . 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 CVA (cerebral infarction) 04/11/2012  . Arthritis 08/05/2010  . Tobacco abuse 08/05/2010  . Mitral valve regurgitation   . Hypertension   . Hypercholesteremia   . Coronary artery disease     Solon Palm PT 06/29/2018, 3:57 PM  [facility] [place] [facility] [address] [place], [place], [address] Phone: [phone]   Fax:  [phone]  Name: Sara [name] MRN: [id number] Date of Birth: Aug 27, 1940

## 2018-06-29 NOTE — Patient Instructions (Signed)
Access Code: [email or web address]  URL: [email or web address]  Date: 06/29/2018  Prepared by: Solon Palm   Exercises  Supine Bridge - 10 reps - 1-2 sets - 2-3 sec hold - 2x daily - 7x weekly  Long Sitting Calf Stretch with Strap - 3 reps - 1 sets - 30 sec hold - 2x daily - 7x weekly  Seated Ankle Pumps - 10 reps - 3 sets - 3-5 sec hold - 1x daily - 7x weekly  Long Sitting Quad Set - 10 reps - 3 sets - 5 sec hold - 2x daily - 7x weekly  Supine Isometric Hamstring Set - 10 reps - 3 sets - 5 sec hold hold - 2x daily - 7x weekly

## 2018-07-05 ENCOUNTER — Ambulatory Visit (INDEPENDENT_AMBULATORY_CARE_PROVIDER_SITE_OTHER): Payer: Medicare Other | Admitting: Orthopaedic Surgery

## 2018-07-13 ENCOUNTER — Ambulatory Visit: Payer: Medicare Other

## 2018-07-25 ENCOUNTER — Ambulatory Visit: Payer: Medicare Other | Admitting: Physical Therapy

## 2018-07-26 ENCOUNTER — Ambulatory Visit (INDEPENDENT_AMBULATORY_CARE_PROVIDER_SITE_OTHER): Payer: Medicare Other | Admitting: Internal Medicine

## 2018-07-26 ENCOUNTER — Other Ambulatory Visit: Payer: Self-pay

## 2018-07-26 DIAGNOSIS — N390 Urinary tract infection, site not specified: Secondary | ICD-10-CM

## 2018-07-26 DIAGNOSIS — R627 Adult failure to thrive: Secondary | ICD-10-CM | POA: Diagnosis not present

## 2018-07-26 DIAGNOSIS — F329 Major depressive disorder, single episode, unspecified: Secondary | ICD-10-CM | POA: Diagnosis not present

## 2018-07-26 DIAGNOSIS — F419 Anxiety disorder, unspecified: Secondary | ICD-10-CM | POA: Diagnosis not present

## 2018-07-26 DIAGNOSIS — R399 Unspecified symptoms and signs involving the genitourinary system: Secondary | ICD-10-CM

## 2018-07-26 DIAGNOSIS — F32A Depression, unspecified: Secondary | ICD-10-CM

## 2018-07-26 NOTE — Progress Notes (Addendum)
Subjective:    Patient ID: Sara [name], female    DOB: 1940-10-16, 78 y.o.   MRN: [id number]  DOS:  07/26/2018 Type of visit - description:Virtual Visit via Telephone Note  I connected with@ on 07/28/18 at  2:20 PM EDT by telephone and verified that I am speaking with the correct person using two identifiers.  THIS ENCOUNTER IS A VIRTUAL VISIT DUE TO COVID-19 - PATIENT WAS NOT SEEN IN THE OFFICE. PATIENT HAS CONSENTED TO VIRTUAL VISIT / TELEMEDICINE VISIT   Location of patient: home  Location of provider: office  I discussed the limitations, risks, security and privacy concerns of performing an evaluation and management service by telephone and the availability of in person appointments. I also discussed with the patient that there may be a patient responsible charge related to this service. The patient expressed understanding and agreed to proceed.   History of Present Illness: I spoke with the patient and Sara [name], her daughter. 2 weeks ago, had a upper back epidural injection. Shortly after that she developed generalized weakness. She had to be helped going back to her car after the injection. She is not feeling well since then. Her gait has been much more difficult and is needing help on and off. Her upper extremities are also weak. There is no pain, swelling or redness at the site of the epidural injection  Review of Systems Denies fever chills, she has been checking her temperature daily No nausea, vomiting, diarrhea. Ambulatory BPs are reportedly normal No rash + Decreased appetite. Denies chest pain, shortness of breath, no cough. No palpitations or edema. Her urine has particular "odor" but no dysuria or gross hematuria per se  Past Medical History:  Diagnosis Date  . Anxiety   . Arthritis   . Bronchitis    hx of  . Carotid artery occlusion   . Chronic pain syndrome    pain management  . Coronary artery disease    sees Dr. Patty Sermons  . Dementia (HCC)   . GERD  (gastroesophageal reflux disease)    hx of  . H/O hiatal hernia   . Hypercholesteremia   . Hypertension    all managed by Dr. Haynes Dage bill  . Mitral valve regurgitation    trace  . Non-traumatic compression fracture of thoracic vertebra (HCC)    T9  . Pneumonia 05/27/2017  . Stroke (HCC)   . Wears dentures   . Wears glasses     Past Surgical History:  Procedure Laterality Date  . BACK SURGERY     x 3 Dr Juliene Pina, last @ [facility] ~ 2005, rods, fusion, four surgery  . CARDIAC CATHETERIZATION  1994  . CAROTID ENDARTERECTOMY    . CARPAL TUNNEL RELEASE     left  . CHOLECYSTECTOMY    . ENDARTERECTOMY  04/24/2012   Procedure: ENDARTERECTOMY CAROTID;  Surgeon: Larina Earthly, MD;  Location: [facility] OR;  Service: Vascular;  Laterality: Left;  . HAND SURGERY     bilateral  . HIP FRACTURE SURGERY Left   . HIP PINNING,CANNULATED Left 02/08/2015   Procedure: CANNULATED HIP PINNING;  Surgeon: Kathryne Hitch, MD;  Location: [facility] ORS;  Service: Orthopedics;  Laterality: Left;  . HIP PINNING,CANNULATED Right 03/31/2016   Procedure: CANNULATED HIP PINNING;  Surgeon: Kathryne Hitch, MD;  Location: [facility] ORS;  Service: Orthopedics;  Laterality: Right;  . HYSTEROTOMY    . KNEE ARTHROSCOPY Right 02/14/2018   Procedure: RIGHT KNEE ARTHROSCOPY WITH POSSIBLE PARTIAL MENISCECTOMY;  Surgeon: Doneen Poisson  Y, MD;  Location: [facility] OR;  Service: Orthopedics;  Laterality: Right;  . MULTIPLE TOOTH EXTRACTIONS    . SPINAL CORD STIMULATOR INSERTION    . TEE WITHOUT CARDIOVERSION  04/12/2012   Procedure: TRANSESOPHAGEAL ECHOCARDIOGRAM (TEE);  Surgeon: Lewayne Bunting, MD;  Location: [facility] ENDOSCOPY;  Service: Cardiovascular;  Laterality: N/A;  . TONSILLECTOMY      Social History   Socioeconomic History  . Marital status: Married    Spouse name: Not on file  . Number of children: 4  . Years of education: Not on file  . Highest education level: Not on file  Occupational History  . Occupation:  retired   Engineer, production  . Financial resource strain: Not on file  . Food insecurity:    Worry: Not on file    Inability: Not on file  . Transportation needs:    Medical: Not on file    Non-medical: Not on file  Tobacco Use  . Smoking status: Former Smoker    Packs/day: 0.50    Years: 60.00    Pack years: 30.00    Types: Cigarettes  . Smokeless tobacco: Never Used  . Tobacco comment: quit 04-2015  Substance and Sexual Activity  . Alcohol use: No    Alcohol/week: 0.0 standard drinks  . Drug use: No  . Sexual activity: Not Currently  Lifestyle  . Physical activity:    Days per week: Not on file    Minutes per session: Not on file  . Stress: Not on file  Relationships  . Social connections:    Talks on phone: Not on file    Gets together: Not on file    Attends religious service: Not on file    Active member of club or organization: Not on file    Attends meetings of clubs or organizations: Not on file    Relationship status: Not on file  . Intimate partner violence:    Fear of current or ex partner: Not on file    Emotionally abused: Not on file    Physically abused: Not on file    Forced sexual activity: Not on file  Other Topics Concern  . Not on file  Social History Narrative   Lives w/ husband in a one story home.     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   Education: high school      Allergies as of 07/26/2018      Reactions   Penicillins Other (See Comments)   Has patient had a PCN reaction causing immediate rash, facial/tongue/throat swelling, SOB or lightheadedness with hypotension: # # # Yes # # # Has patient had a PCN reaction causing severe rash involving mucus membranes or skin necrosis: No Has patient had a PCN reaction that required hospitalization No Has patient had a PCN reaction occurring within the last 10 years: No If all of the above answers are "NO", then may proceed with Cephalosporin use.   Morphine And  Related Rash   Percocet [oxycodone-acetaminophen] Rash   Tolerates APAP (including IV), morphine, and Hydrocodone/APAP   Hydrochlorothiazide Other (See Comments)   Pt does not remember   Indomethacin    Pt does not remember   Paroxetine Hcl    Pt does not remember   Pravachol Other (See Comments)   Pt does not remember   Quinine Derivatives    Pt does not remember   Wellbutrin [bupropion]  agitation   Zocor [simvastatin - High Dose] Other (See Comments)   Weakness/no energy      Medication List       Accurate as of July 26, 2018 11:59 PM. Always use your most recent med list.        amLODipine 10 MG tablet Commonly known as:  NORVASC Take 1 tablet (10 mg total) by mouth daily.   aspirin EC 81 MG tablet Take 81 mg by mouth daily.   atorvastatin 40 MG tablet Commonly known as:  LIPITOR Take 0.5 tablets (20 mg total) by mouth daily.   diazepam 5 MG tablet Commonly known as:  VALIUM TAKE 1 TABLET BY MOUTH EVERY DAY AS NEEDED   diphenhydrAMINE 25 mg capsule Commonly known as:  BENADRYL Take 25 mg by mouth every 6 (six) hours as needed (runny nose).   donepezil 10 MG tablet Commonly known as:  ARICEPT Take 1 tablet (10 mg total) by mouth at bedtime.   gabapentin 300 MG capsule Commonly known as:  NEURONTIN TAKE 1 CAPSULE BY MOUTH EVERY MORNING AND 2 CAPSULES BY MOUTH EVERY PM AT BEDTIME   Hysingla ER 40 MG T24a Generic drug:  HYDROcodone Bitartrate ER Take 40 mg by mouth at bedtime.   lidocaine-prilocaine cream Commonly known as:  EMLA   metoprolol tartrate 50 MG tablet Commonly known as:  LOPRESSOR Take 0.5 tablets (25 mg total) by mouth 2 (two) times daily. (BETA BLOCKER)   omeprazole 20 MG capsule Commonly known as:  PRILOSEC Take 1 capsule (20 mg total) by mouth daily.   oxyCODONE-acetaminophen 7.5-325 MG tablet Commonly known as:  PERCOCET Take 1 tablet by mouth every 6 (six) hours as needed for moderate pain or severe pain.   vitamin B-12 1000  MCG tablet Commonly known as:  CYANOCOBALAMIN Take 1,000 mcg by mouth daily.           Objective:   Physical Exam There were no vitals taken for this visit. 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 Depression started Lexapro around 04-2011 Mild Dementia CV --CAD  --H/o Stroke 2013:non hemorrhagic infarcts scattered throughout the [place] Hem[place] . 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  She is describing essentially failure to thrive, review of systems is benign. She did complain of a urine w/ a "odor", ? UTI. Interestingly, symptoms are started immediately after  upper back epidural, something to keep in mind, the muscle weakness is both in the upper and lower extremities. In the midst of the coronavirus pandemia it is certainly risky to bring her to the  office consequently we agreed on the following: Sara [name] will pick up a cup and provide a urine sample for a UA urine culture.  UTI?Marland Kitchen We will monitor the symptoms for the next few days, if she is worse needs to go to the ER, if she is not improving will set up a face-to-face visit next week. Depression: The patient tried Lexapro and subsequently fluoxetine and self discontinue, apparently she did not like the way those meds make her feel.  We will have to reassess at the next opportunity    I discussed the assessment and treatment plan with the patient. The patient was provided an opportunity to ask questions and all were answered. The patient agreed with the plan and demonstrated an understanding of the instructions.   The patient was advised to call back or seek an in-person evaluation if the symptoms worsen or if the condition fails to improve as anticipated.  I provided > 25 minutes of non-face-to-face time during this encounter.  [name], MD

## 2018-07-26 NOTE — Assessment & Plan Note (Signed)
Failure to thrive: The patient and the daughter are aware of the limitations of a phone evaluation.  She is describing essentially failure to thrive, review of systems is benign. She did complain of a urine w/ a "odor", ? UTI. Interestingly, symptoms are started immediately after upper back epidural, something to keep in mind, the muscle weakness is both in the upper and lower extremities. In the midst of the coronavirus pandemia it is certainly risky to bring her to the office consequently we agreed on the following: [name] will pick up a cup and provide a urine sample for a UA urine culture.  UTI?Marland Kitchen We will monitor the symptoms for the next few days, if she is worse needs to go to the ER, if she is not improving will set up a face-to-face visit next week. Depression: The patient tried Lexapro and subsequently fluoxetine and self discontinue, apparently she did not like the way those meds make her feel.  We will have to reassess at the next opportunity

## 2018-07-27 ENCOUNTER — Other Ambulatory Visit (INDEPENDENT_AMBULATORY_CARE_PROVIDER_SITE_OTHER): Payer: Medicare Other

## 2018-07-27 ENCOUNTER — Other Ambulatory Visit: Payer: Self-pay

## 2018-07-27 DIAGNOSIS — R399 Unspecified symptoms and signs involving the genitourinary system: Secondary | ICD-10-CM

## 2018-07-27 LAB — URINALYSIS, ROUTINE W REFLEX MICROSCOPIC
Bilirubin Urine: NEGATIVE
Hgb urine dipstick: NEGATIVE
Ketones, ur: NEGATIVE
Leukocytes,Ua: NEGATIVE
Nitrite: NEGATIVE
RBC / HPF: NONE SEEN (ref 0–?)
Specific Gravity, Urine: 1.015 (ref 1.000–1.030)
Total Protein, Urine: NEGATIVE
Urine Glucose: NEGATIVE
Urobilinogen, UA: 0.2 (ref 0.0–1.0)
pH: 7 (ref 5.0–8.0)

## 2018-07-28 ENCOUNTER — Telehealth: Payer: Self-pay | Admitting: Internal Medicine

## 2018-07-28 LAB — URINE CULTURE
MICRO NUMBER:: 371643
SPECIMEN QUALITY:: ADEQUATE

## 2018-07-28 NOTE — Addendum Note (Signed)
Addended by: [name] E on: 07/28/2018 12:26 PM   Modules accepted: Orders

## 2018-07-28 NOTE — Telephone Encounter (Signed)
Urinalysis came back negative.  Urine culture pending. I called the patient's daughter [name]: Ted is doing about the same, continues to be fatigued. No fever, no chills. No cough or unusual aches or pains. Yesterday, he went to the orthopedic doctor office, BP was normal, they are arranging for a CT of the area where she had a epidural.  Overall, it is hard to say why she is feeling so fatigued.  I advised [name] that bring her to the office in the midst of COVID-19 pandemia may be more risky than beneficial.  We agreed on observation for now, if she develop other symptoms in addition to fatigue she will let me know.

## 2018-07-31 ENCOUNTER — Other Ambulatory Visit: Payer: Self-pay | Admitting: Pain Medicine

## 2018-07-31 DIAGNOSIS — R29898 Other symptoms and signs involving the musculoskeletal system: Secondary | ICD-10-CM

## 2018-08-03 ENCOUNTER — Other Ambulatory Visit: Payer: Medicare Other

## 2018-08-23 ENCOUNTER — Ambulatory Visit (INDEPENDENT_AMBULATORY_CARE_PROVIDER_SITE_OTHER): Payer: Medicare Other

## 2018-08-23 ENCOUNTER — Other Ambulatory Visit: Payer: Self-pay

## 2018-08-23 DIAGNOSIS — M810 Age-related osteoporosis without current pathological fracture: Secondary | ICD-10-CM | POA: Diagnosis not present

## 2018-08-23 MED ORDER — DENOSUMAB 60 MG/ML ~~LOC~~ SOSY
60.0000 mg | PREFILLED_SYRINGE | Freq: Once | SUBCUTANEOUS | Status: AC
  Administered 2018-08-23: 60 mg via SUBCUTANEOUS

## 2018-08-23 NOTE — Progress Notes (Addendum)
Pre visit review using our clinic tool,if applicable. No additional management support is needed unless otherwise documented below in the visit note.   Patient in for Prolia injection per order from Dr. [name].  Given SQ in left outer arm. Patient tolerated well. Reminder card given for 6 months. Package inserts given to patient to read as well. This was her first Prolia injection.  [name], MD

## 2018-08-28 ENCOUNTER — Other Ambulatory Visit: Payer: Self-pay | Admitting: Internal Medicine

## 2018-09-25 ENCOUNTER — Ambulatory Visit: Payer: Medicare Other | Admitting: Physician Assistant

## 2018-09-27 ENCOUNTER — Ambulatory Visit: Payer: Medicare Other | Admitting: Physician Assistant

## 2018-09-27 ENCOUNTER — Other Ambulatory Visit: Payer: Self-pay

## 2018-09-27 ENCOUNTER — Encounter: Payer: Self-pay | Admitting: Physician Assistant

## 2018-09-27 VITALS — Ht 65.0 in | Wt 135.0 lb

## 2018-09-27 DIAGNOSIS — M25461 Effusion, right knee: Secondary | ICD-10-CM | POA: Diagnosis not present

## 2018-09-27 MED ORDER — LIDOCAINE HCL 1 % IJ SOLN
5.0000 mL | INTRAMUSCULAR | Status: AC | PRN
  Administered 2018-09-27: 18:00:00 5 mL

## 2018-09-27 MED ORDER — METHYLPREDNISOLONE ACETATE 40 MG/ML IJ SUSP
40.0000 mg | INTRAMUSCULAR | Status: AC | PRN
  Administered 2018-09-27: 40 mg via INTRA_ARTICULAR

## 2018-09-27 NOTE — Progress Notes (Signed)
   Procedure Note  Patient: Sara [name]             Date of Birth: [birth year]           MRN: [id number]             Visit Date: 09/27/2018  Sara [name] is 7 months status post knee arthroscopy.  Again she only had some slight cartilage thinning of the knee and the medial meniscal tear for which she underwent a partial meniscectomy.  She returns today due to increased pain and swelling of the knee.  She is asking for repeat injection and possible aspiration.  ROS: Denies any fevers or chills.  Physical exam: Right knee good range of motion.  No abnormal warmth or erythema.  Slight effusion.  No instability valgus varus stressing.  Procedures: Visit Diagnoses: Effusion, right knee - Plan: Large Joint Inj: R knee  Large Joint Inj: R knee on 09/27/2018 5:50 PM Indications: pain Details: 22 G 1.5 in needle, superolateral approach  Arthrogram: No  Medications: 40 mg methylPREDNISolone acetate 40 MG/ML; 5 mL lidocaine 1 % Aspirate: 30 mL yellow Outcome: tolerated well, no immediate complications Procedure, treatment alternatives, risks and benefits explained, specific risks discussed. Consent was given by the patient. Immediately prior to procedure a time out was called to verify the correct patient, procedure, equipment, support staff and site/side marked as required. Patient was prepped and draped in the usual sterile fashion.     Plan she will follow-up with [name] as needed.  She and her daughter understand that she can only have cortisone injections in the knee every 3 months.  However if she needs an aspiration between now and then she can definitely return for this.

## 2018-10-11 ENCOUNTER — Other Ambulatory Visit: Payer: Self-pay | Admitting: Internal Medicine

## 2018-10-30 ENCOUNTER — Telehealth: Payer: Self-pay | Admitting: Internal Medicine

## 2018-10-30 NOTE — Telephone Encounter (Signed)
Diazepam refill.   Last OV: 07/26/2018  Last Fill: 05/18/2018 #30 and 1RF UDS: contract w/ pain medicine

## 2018-10-30 NOTE — Telephone Encounter (Signed)
Sent!

## 2018-11-10 ENCOUNTER — Ambulatory Visit: Payer: Medicare Other | Admitting: Internal Medicine

## 2018-12-04 ENCOUNTER — Other Ambulatory Visit: Payer: Self-pay

## 2018-12-04 ENCOUNTER — Encounter: Payer: Self-pay | Admitting: Internal Medicine

## 2018-12-04 ENCOUNTER — Ambulatory Visit (INDEPENDENT_AMBULATORY_CARE_PROVIDER_SITE_OTHER): Payer: Medicare Other | Admitting: Internal Medicine

## 2018-12-04 DIAGNOSIS — J309 Allergic rhinitis, unspecified: Secondary | ICD-10-CM

## 2018-12-04 DIAGNOSIS — R1310 Dysphagia, unspecified: Secondary | ICD-10-CM

## 2018-12-04 MED ORDER — PANTOPRAZOLE SODIUM 40 MG PO TBEC: 40.0000 mg | DELAYED_RELEASE_TABLET | Freq: Every day | ORAL | 3 refills | Status: DC

## 2018-12-04 NOTE — Progress Notes (Signed)
Subjective:    Patient ID: Sara [name], female    DOB: 12-25-1940, 78 y.o.   MRN: [id number]  DOS:  12/04/2018 Type of visit - description: Attempted  to make this a video visit, due to technical difficulties from the patient side it was not possible  thus we proceeded with a Virtual Visit via Telephone    I connected with@   by telephone and verified that I am speaking with the correct person using two identifiers.  THIS ENCOUNTER IS A VIRTUAL VISIT DUE TO COVID-19 - PATIENT WAS NOT SEEN IN THE OFFICE. PATIENT HAS CONSENTED TO VIRTUAL VISIT / TELEMEDICINE VISIT   Location of patient: home  Location of provider: office  I discussed the limitations, risks, security and privacy concerns of performing an evaluation and management service by telephone and the availability of in person appointments. I also discussed with the patient that there may be a patient responsible charge related to this service. The patient expressed understanding and agreed to proceed.   History of Present Illness: Acute 2 days history of sinus pain, congestion, some puffiness under her eyes and clear runny nose.   Also, dysphagia has come back, occasionally heartburn and a "sour" feeling in the chest. Currently taking OTC acid reducer.  Name?.  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  Review of Systems Has checked her temperature, no fever. Denies nausea, vomiting.  3 days ago had loose stools x2 or 3 times, that is resolved. Appetite is very good. No cough No chest pain no difficulty breathing  Past Medical History:  Diagnosis Date  . Anxiety   . Arthritis   . Bronchitis    hx of  . Carotid artery occlusion   . Chronic pain syndrome    pain management  . Coronary artery disease    sees Dr. Mare Ferrari  . Dementia ([place])   . GERD (gastroesophageal reflux disease)    hx of  . H/O hiatal hernia   . Hypercholesteremia   . Hypertension    all managed by Dr. Guss Bunde bill  . Mitral valve  regurgitation    trace  . Non-traumatic compression fracture of thoracic vertebra (HCC)    T9  . Pneumonia 05/27/2017  . Stroke ([place])   . Wears dentures   . Wears glasses     Past Surgical History:  Procedure Laterality Date  . BACK SURGERY     x 3 Dr Cay Schillings, last @ [facility] ~ 2005, rods, fusion, four surgery  . CARDIAC CATHETERIZATION  1994  . CAROTID ENDARTERECTOMY    . CARPAL TUNNEL RELEASE     left  . CHOLECYSTECTOMY    . ENDARTERECTOMY  04/24/2012   Procedure: ENDARTERECTOMY CAROTID;  Surgeon: Rosetta Posner, MD;  Location: [place];  Service: Vascular;  Laterality: Left;  . HAND SURGERY     bilateral  . HIP FRACTURE SURGERY Left   . HIP PINNING,CANNULATED Left 02/08/2015   Procedure: CANNULATED HIP PINNING;  Surgeon: Mcarthur Rossetti, MD;  Location: [facility];  Service: Orthopedics;  Laterality: Left;  . HIP PINNING,CANNULATED Right 03/31/2016   Procedure: CANNULATED HIP PINNING;  Surgeon: Mcarthur Rossetti, MD;  Location: [facility];  Service: Orthopedics;  Laterality: Right;  . HYSTEROTOMY    . KNEE ARTHROSCOPY Right 02/14/2018   Procedure: RIGHT KNEE ARTHROSCOPY WITH POSSIBLE PARTIAL MENISCECTOMY;  Surgeon: Mcarthur Rossetti, MD;  Location: [facility];  Service: Orthopedics;  Laterality: Right;  . MULTIPLE TOOTH EXTRACTIONS    . SPINAL  CORD STIMULATOR INSERTION    . TEE WITHOUT CARDIOVERSION  04/12/2012   Procedure: TRANSESOPHAGEAL ECHOCARDIOGRAM (TEE);  Surgeon: Lewayne BuntingBrian S Crenshaw, MD;  [facility];  Service: Cardiovascular;  Laterality: N/A;  . TONSILLECTOMY      Social History   Socioeconomic History  . Marital status: Married    Spouse name: Not on file  . Number of children: 4  . Years of education: Not on file  . Highest education level: Not on file  Occupational History  . Occupation: retired   Engineer, productionocial Needs  . Financial resource strain: Not on file  . Food insecurity    Worry: Not on file    Inability: Not on file  . Transportation needs     Medical: Not on file    Non-medical: Not on file  Tobacco Use  . Smoking status: Former Smoker    Packs/day: 0.50    Years: 60.00    Pack years: 30.00    Types: Cigarettes  . Smokeless tobacco: Never Used  . Tobacco comment: quit 04-2015  Substance and Sexual Activity  . Alcohol use: No    Alcohol/week: 0.0 standard drinks  . Drug use: No  . Sexual activity: Not Currently  Lifestyle  . Physical activity    Days per week: Not on file    Minutes per session: Not on file  . Stress: Not on file  Relationships  . Social Musicianconnections    Talks on phone: Not on file    Gets together: Not on file    Attends religious service: Not on file    Active member of club or organization: Not on file    Attends meetings of clubs or organizations: Not on file    Relationship status: Not on file  . Intimate partner violence    Fear of current or ex partner: Not on file    Emotionally abused: Not on file    Physically abused: Not on file    Forced sexual activity: Not on file  Other Topics Concern  . Not on file  Social History Narrative   Lives w/ husband in a one story home.     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   Education: high school      Allergies as of 12/04/2018      Reactions   Penicillins Other (See Comments)   Has patient had a PCN reaction causing immediate rash, facial/tongue/throat swelling, SOB or lightheadedness with hypotension: # # # Yes # # # Has patient had a PCN reaction causing severe rash involving mucus membranes or skin necrosis: No Has patient had a PCN reaction that required hospitalization No Has patient had a PCN reaction occurring within the last 10 years: No If all of the above answers are "NO", then may proceed with Cephalosporin use.   Morphine And Related Rash   Percocet [oxycodone-acetaminophen] Rash   Tolerates APAP (including IV), morphine, and Hydrocodone/APAP   Hydrochlorothiazide Other (See Comments)   Pt  does not remember   Indomethacin    Pt does not remember   Paroxetine Hcl    Pt does not remember   Pravachol Other (See Comments)   Pt does not remember   Quinine Derivatives    Pt does not remember   Wellbutrin [bupropion]    agitation   Zocor [simvastatin - High Dose] Other (See Comments)   Weakness/no energy      Medication List  Accurate as of December 04, 2018 11:59 PM. If you have any questions, ask your nurse or doctor.        STOP taking these medications   omeprazole 20 MG capsule Commonly known as: PRILOSEC Stopped by: Willow OraJose Tarun Patchell, MD     TAKE these medications   amLODipine 10 MG tablet Commonly known as: NORVASC Take 1 tablet (10 mg total) by mouth daily.   aspirin EC 81 MG tablet Take 81 mg by mouth daily.   atorvastatin 40 MG tablet Commonly known as: LIPITOR Take 0.5 tablets (20 mg total) by mouth daily. What changed: Another medication with the same name was removed. Continue taking this medication, and follow the directions you see here. Changed by: Willow OraJose Cal Gindlesperger, MD   diazepam 5 MG tablet Commonly known as: VALIUM TAKE 1 TABLET BY MOUTH EVERY DAY AS NEEDED   diphenhydrAMINE 25 mg capsule Commonly known as: BENADRYL Take 25 mg by mouth every 6 (six) hours as needed (runny nose).   donepezil 10 MG tablet Commonly known as: ARICEPT Take 1 tablet (10 mg total) by mouth at bedtime.   gabapentin 300 MG capsule Commonly known as: NEURONTIN TAKE 1 CAPSULE BY MOUTH EVERY MORNING AND 2 CAPSULES BY MOUTH EVERY PM AT BEDTIME   Hysingla ER 40 MG T24a Generic drug: HYDROcodone Bitartrate ER Take 40 mg by mouth at bedtime.   lidocaine-prilocaine cream Commonly known as: EMLA   metoprolol tartrate 50 MG tablet Commonly known as: LOPRESSOR Take 0.5 tablets (25 mg total) by mouth 2 (two) times daily.   oxyCODONE-acetaminophen 7.5-325 MG tablet Commonly known as: PERCOCET Take 1 tablet by mouth every 6 (six) hours as needed for moderate pain or severe  pain.   pantoprazole 40 MG tablet Commonly known as: PROTONIX Take 1 tablet (40 mg total) by mouth daily. Started by: Willow OraJose Jeshurun [place]aca, MD   vitamin B-12 1000 MCG tablet Commonly known as: CYANOCOBALAMIN Take 1,000 mcg by mouth daily.           Objective:   Physical Exam There were no vitals taken for this visit. This is a virtual phone visit, alert oriented x3, sounds at baseline.    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 Depression started Lexapro around 04-2011 Mild Dementia CV --CAD  --H/o Stroke 2013:non hemorrhagic infarcts scattered throughout the [place] Hem[place] . 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 The patient knows the limitations of a phone evaluation, she reports sinus congestion for 2 days, no fever or chills.  No cough.  Possibly has allergies. Plan: Flonase,  Claritin, lots of fluids, Tylenol, call if not better.  We will send a summary of recommendations through a MyChart message, her daughter checks her  messages. Dysphagia: Similar problem 07/25/2017, a barium study showed dysmotility.  Symptoms have come back to some extent. Plan: Stop over-the-counter acid reducer, start Protonix, prescription sent. Call if not better RTC 3 months, will call and schedule

## 2018-12-05 NOTE — Assessment & Plan Note (Signed)
Allergies? The patient knows the limitations of a phone evaluation, she reports sinus congestion for 2 days, no fever or chills.  No cough.  Possibly has allergies. Plan: Flonase, Claritin, lots of fluids, Tylenol, call if not better.  We will send a summary of recommendations through a MyChart message, her daughter checks her messages. Dysphagia: Similar problem 07/25/2017, a barium study showed dysmotility.  Symptoms have come back to some extent. Plan: Stop over-the-counter acid reducer, start Protonix, prescription sent. Call if not better RTC 3 months, will call and schedule

## 2018-12-21 ENCOUNTER — Encounter: Payer: Self-pay | Admitting: Internal Medicine

## 2018-12-21 ENCOUNTER — Other Ambulatory Visit: Payer: Self-pay

## 2018-12-21 ENCOUNTER — Ambulatory Visit (INDEPENDENT_AMBULATORY_CARE_PROVIDER_SITE_OTHER): Payer: Medicare Other | Admitting: Internal Medicine

## 2018-12-21 ENCOUNTER — Ambulatory Visit: Payer: Medicare Other | Admitting: Internal Medicine

## 2018-12-21 VITALS — BP 141/67 | HR 72 | Temp 97.0°F | Resp 18 | Ht 65.0 in | Wt 143.1 lb

## 2018-12-21 DIAGNOSIS — R21 Rash and other nonspecific skin eruption: Secondary | ICD-10-CM

## 2018-12-21 DIAGNOSIS — G8929 Other chronic pain: Secondary | ICD-10-CM

## 2018-12-21 DIAGNOSIS — F329 Major depressive disorder, single episode, unspecified: Secondary | ICD-10-CM

## 2018-12-21 DIAGNOSIS — R1310 Dysphagia, unspecified: Secondary | ICD-10-CM | POA: Diagnosis not present

## 2018-12-21 DIAGNOSIS — M25561 Pain in right knee: Secondary | ICD-10-CM | POA: Diagnosis not present

## 2018-12-21 DIAGNOSIS — F419 Anxiety disorder, unspecified: Secondary | ICD-10-CM | POA: Diagnosis not present

## 2018-12-21 MED ORDER — DULOXETINE HCL 30 MG PO CPEP: 30.0000 mg | ORAL_CAPSULE | Freq: Two times a day (BID) | ORAL | 1 refills | Status: DC

## 2018-12-21 MED ORDER — HYDROCORTISONE 2.5 % EX CREA: TOPICAL_CREAM | Freq: Two times a day (BID) | CUTANEOUS | 0 refills | Status: AC

## 2018-12-21 NOTE — Patient Instructions (Addendum)
  GO TO THE FRONT DESK Schedule your next appointment for a checkup in 4 weeks  Hold Protonix for now  Hydrocortisone cream: Apply to the blisters twice a day  Start Cymbalta 30 mg: 1 tablet daily for 10 days, then 1 tablet twice a day

## 2018-12-21 NOTE — Assessment & Plan Note (Addendum)
Rash: Unclear etiology, related to Protonix?.  Will recommend to hold Protonix, hydrocortisone 2.5% and the existing lesions.  If not better, round of prednisone?,  Dermatology referral?  BX? Dementia: Currently on Aricept, no behavioral issues, she is alert oriented x3.  No change for now. Depression: The patient PHQ 9 is 0, she reports depression from time to time, I believe most of her issues are frustration due to knee pain.  She is allergic to Wellbutrin, has not tolerated 2 recent SSRIs.  Plan: Start Cymbalta 1 tablet daily for 10 days then 1 twice a day. DJD, knee: Request a second opinion, she understands treatment options are very limited due to her general health, unable to do MRI due to the spinal cord stimulator, etc. Infrarenal AAA: Given her general state, she would not accept surgery or any procedure.  No need to redo a CT. RTC 4 weeks

## 2018-12-21 NOTE — Progress Notes (Signed)
Subjective:    Patient ID: Sara [name], female    DOB: 08/13/1940, 78 y.o.   MRN: [id number]  DOS:  12/21/2018 Type of visit - description: Acute About 2 weeks ago developed a rash described as blisters at the legs, neck, behind the right ear. No itching but it stings and burns. Rash coincide with the initiation of Protonix.  Dementia: The patient states she is doing worse, very forgetful.  The husband is here, he reports no behavioral issues.  Dysphagia: Better  Depression: PHQ 9 is 0 however she reports that from time to time she gets depressed, mostly d/t chronic pain.  The most pain she has now is at the right knee, she has been Ortho multiple times and the knee has been drained.    Review of Systems No fever chills Has not been outdoors or exposed to any plants to account for poison ivy.   Past Medical History:  Diagnosis Date  . Anxiety   . Arthritis   . Bronchitis    hx of  . Carotid artery occlusion   . Chronic pain syndrome    pain management  . Coronary artery disease    sees Dr. Patty SermonsBrackbill  . Dementia (HCC)   . GERD (gastroesophageal reflux disease)    hx of  . H/O hiatal hernia   . Hypercholesteremia   . Hypertension    all managed by Dr. Haynes DageBrack bill  . Mitral valve regurgitation    trace  . Non-traumatic compression fracture of thoracic vertebra (HCC)    T9  . Pneumonia 05/27/2017  . Stroke (HCC)   . Wears dentures   . Wears glasses     Past Surgical History:  Procedure Laterality Date  . BACK SURGERY     x 3 Dr Juliene PinaGeoffrey, [facility], rods, fusion, four surgery  . CARDIAC CATHETERIZATION  1994  . CAROTID ENDARTERECTOMY    . CARPAL TUNNEL RELEASE     left  . CHOLECYSTECTOMY    . ENDARTERECTOMY  04/24/2012   Procedure: ENDARTERECTOMY CAROTID;  Surgeon: Larina Earthlyodd F Early, MD;  [facility];  Service: Vascular;  Laterality: Left;  . HAND SURGERY     bilateral  . HIP FRACTURE SURGERY Left   . HIP PINNING,CANNULATED Left 02/08/2015    Procedure: CANNULATED HIP PINNING;  Surgeon: Kathryne Hitchhristopher Y Blackman, MD;  Location: [facility] ORS;  Service: Orthopedics;  Laterality: Left;  . HIP PINNING,CANNULATED Right 03/31/2016   Procedure: CANNULATED HIP PINNING;  Surgeon: Kathryne Hitchhristopher Y Blackman, MD;  Location: [facility] ORS;  Service: Orthopedics;  Laterality: Right;  . HYSTEROTOMY    . KNEE ARTHROSCOPY Right 02/14/2018   Procedure: RIGHT KNEE ARTHROSCOPY WITH POSSIBLE PARTIAL MENISCECTOMY;  Surgeon: Kathryne HitchBlackman, Christopher Y, MD;  Location: [facility] OR;  Service: Orthopedics;  Laterality: Right;  . MULTIPLE TOOTH EXTRACTIONS    . SPINAL CORD STIMULATOR INSERTION    . TEE WITHOUT CARDIOVERSION  04/12/2012   Procedure: TRANSESOPHAGEAL ECHOCARDIOGRAM (TEE);  Surgeon: Lewayne BuntingBrian S Crenshaw, MD;  [facility];  Service: Cardiovascular;  Laterality: N/A;  . TONSILLECTOMY      Social History   Socioeconomic History  . Marital status: Married    Spouse name: Not on file  . Number of children: 4  . Years of education: Not on file  . Highest education level: Not on file  Occupational History  . Occupation: retired   Engineer, productionocial Needs  . Financial resource strain: Not on file  . Food insecurity    Worry:  Not on file    Inability: Not on file  . Transportation needs    Medical: Not on file    Non-medical: Not on file  Tobacco Use  . Smoking status: Former Smoker    Packs/day: 0.50    Years: 60.00    Pack years: 30.00    Types: Cigarettes  . Smokeless tobacco: Never Used  . Tobacco comment: quit 04-2015  Substance and Sexual Activity  . Alcohol use: No    Alcohol/week: 0.0 standard drinks  . Drug use: No  . Sexual activity: Not Currently  Lifestyle  . Physical activity    Days per week: Not on file    Minutes per session: Not on file  . Stress: Not on file  Relationships  . Social Musician on phone: Not on file    Gets together: Not on file    Attends religious service: Not on file    Active member of club or organization:  Not on file    Attends meetings of clubs or organizations: Not on file    Relationship status: Not on file  . Intimate partner violence    Fear of current or ex partner: Not on file    Emotionally abused: Not on file    Physically abused: Not on file    Forced sexual activity: Not on file  Other Topics Concern  . Not on file  Social History Narrative   Lives w/ husband in a one story home.     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   Education: high school      Allergies as of 12/21/2018      Reactions   Penicillins Other (See Comments)   Has patient had a PCN reaction causing immediate rash, facial/tongue/throat swelling, SOB or lightheadedness with hypotension: # # # Yes # # # Has patient had a PCN reaction causing severe rash involving mucus membranes or skin necrosis: No Has patient had a PCN reaction that required hospitalization No Has patient had a PCN reaction occurring within the last 10 years: No If all of the above answers are "NO", then may proceed with Cephalosporin use.   Morphine And Related Rash   Percocet [oxycodone-acetaminophen] Rash   Tolerates APAP (including IV), morphine, and Hydrocodone/APAP   Hydrochlorothiazide Other (See Comments)   Pt does not remember   Indomethacin    Pt does not remember   Paroxetine Hcl    Pt does not remember   Pravachol Other (See Comments)   Pt does not remember   Quinine Derivatives    Pt does not remember   Wellbutrin [bupropion]    agitation   Zocor [simvastatin - High Dose] Other (See Comments)   Weakness/no energy      Medication List       Accurate as of December 21, 2018  5:05 PM. If you have any questions, ask your nurse or doctor.        amLODipine 10 MG tablet Commonly known as: NORVASC Take 1 tablet (10 mg total) by mouth daily.   aspirin EC 81 MG tablet Take 81 mg by mouth daily.   atorvastatin 40 MG tablet Commonly known as: LIPITOR Take 0.5 tablets (20 mg total)  by mouth daily.   diazepam 5 MG tablet Commonly known as: VALIUM TAKE 1 TABLET BY MOUTH EVERY DAY AS NEEDED   diphenhydrAMINE 25 mg capsule Commonly known as: BENADRYL  Take 25 mg by mouth every 6 (six) hours as needed (runny nose).   donepezil 10 MG tablet Commonly known as: ARICEPT Take 1 tablet (10 mg total) by mouth at bedtime.   DULoxetine 30 MG capsule Commonly known as: Cymbalta Take 1 capsule (30 mg total) by mouth 2 (two) times daily. Started by: Kathlene November, MD   gabapentin 300 MG capsule Commonly known as: NEURONTIN TAKE 1 CAPSULE BY MOUTH EVERY MORNING AND 2 CAPSULES BY MOUTH EVERY PM AT BEDTIME   hydrocortisone 2.5 % cream Apply topically 2 (two) times daily. Started by: Kathlene November, MD   Hysingla ER 40 MG T24a Generic drug: HYDROcodone Bitartrate ER Take 40 mg by mouth at bedtime.   lidocaine-prilocaine cream Commonly known as: EMLA   loratadine 10 MG tablet Commonly known as: CLARITIN Take 10 mg by mouth daily.   metoprolol tartrate 50 MG tablet Commonly known as: LOPRESSOR Take 0.5 tablets (25 mg total) by mouth 2 (two) times daily.   oxyCODONE-acetaminophen 7.5-325 MG tablet Commonly known as: PERCOCET Take 1 tablet by mouth every 6 (six) hours as needed for moderate pain or severe pain.   pantoprazole 40 MG tablet Commonly known as: PROTONIX Take 1 tablet (40 mg total) by mouth daily.   vitamin B-12 1000 MCG tablet Commonly known as: CYANOCOBALAMIN Take 1,000 mcg by mouth daily.           Objective:   Physical Exam BP (!) 141/67 (BP Location: Left Arm, Patient Position: Sitting, Cuff Size: Small)   Pulse 72   Temp (!) 97 F (36.1 C) (Temporal)   Resp 18   Ht 5\' 5"  (1.651 m)   Wt 143 lb 2 oz (64.9 kg)   SpO2 100%   BMI 23.82 kg/m  General:   Well developed, sitting in a wheelchair, in no distress but she does not seem to be completely comfortable due to knee pain. HEENT:  Normocephalic . Face symmetric, atraumatic Lungs:  CTA B  Normal respiratory effort, no intercostal retractions, no accessory muscle use. Heart: RRR,  no murmur.  Some right lower extremity edema, calf is soft and not tender.  She has right knee brace. Skin: Several dried up blisters, they look like very superficial ulcers at the pretibial areas, the back of the right ear, low back. Neurologic:  alert, cooperative, coherent ; oriented to time, self, place. Speech normal, gait not tested. Psych--  Cognition and judgment appear intact.   normal attention span and concentration.  Behavior appropriate. No anxious or depressed appearing.  She does seems frustrated     Assessment      Assessment Pre-diabetes-- a1c 5.9 2013 Neuropathy , NCS 12-2015  purely sensory, axonal  polyneuropathy  HTN Hyperlipidemia Anxiety/depression, rarely uses diazepam. Self d/c ssri trial ~ 05/2018 Mild Dementia CV --CAD  --H/o Stroke 2013:non hemorrhagic infarcts scattered throughout the [place] Hem[place] . 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  (Declined CT 11-2018, pt will not agree to procedures  for AAA) MSK: -Osteoporosis  last DEXA -hip fx 01-2015 -Vertebral FX (s/p v-plasty) - R Hip Fx 03-2016 -Chronic back pain , s/p spinal cord stimulator  Dr Vira Blanco   Abnormal CT abd/pelvis  2017: See full report, some of the findings>> Atherosclerotic ulcer left subclavian T 8 compression fracture Infrarenal abdominal aortic aneurysm 3.5 cm,  follow-up 2 years Narrow  left renal artery , SMA occluded with pancreatic collaterals Mild mesenteric adenopathy, possibly stable, consider 2 year follow-up  R distal ureter enhancement per CT: Saw urology 07-2015, Rx a ultrasound   R adrenal adenoma Smoker , quit ~ 04-2015  Urosepsis 05-2015   PLAN: Rash: Unclear etiology, related to Protonix?.   Will recommend to hold Protonix, hydrocortisone 2.5% and the existing lesions.  If not better, round of prednisone?,  Dermatology referral?  BX? Dementia: Currently on Aricept, no behavioral issues, she is alert oriented x3.  No change for now. Depression: The patient PHQ 9 is 0, she reports depression from time to time, I believe most of her issues are frustration due to knee pain.  She is allergic to Wellbutrin, has not tolerated 2 recent SSRIs.  Plan: Start Cymbalta 1 tablet daily for 10 days then 1 twice a day. DJD, knee: Request a second opinion, she understands treatment options are very limited due to her general health, unable to do MRI due to the spinal cord stimulator, etc. Infrarenal AAA: Given her general state, she would not accept surgery or any procedure.  No need to redo a CT. RTC 4 weeks  Time spent more than 25 minutes

## 2018-12-21 NOTE — Progress Notes (Signed)
Pre visit review using our clinic review tool, if applicable. No additional management support is needed unless otherwise documented below in the visit note. 

## 2018-12-27 ENCOUNTER — Other Ambulatory Visit: Payer: Self-pay | Admitting: Orthopedic Surgery

## 2018-12-27 DIAGNOSIS — S83249A Other tear of medial meniscus, current injury, unspecified knee, initial encounter: Secondary | ICD-10-CM

## 2018-12-28 ENCOUNTER — Other Ambulatory Visit: Payer: Self-pay | Admitting: Internal Medicine

## 2019-01-03 ENCOUNTER — Other Ambulatory Visit: Payer: Self-pay

## 2019-01-03 DIAGNOSIS — G8929 Other chronic pain: Secondary | ICD-10-CM

## 2019-01-04 ENCOUNTER — Other Ambulatory Visit: Payer: Self-pay

## 2019-01-04 ENCOUNTER — Ambulatory Visit (INDEPENDENT_AMBULATORY_CARE_PROVIDER_SITE_OTHER): Payer: Medicare Other | Admitting: Neurology

## 2019-01-04 DIAGNOSIS — G8929 Other chronic pain: Secondary | ICD-10-CM

## 2019-01-04 DIAGNOSIS — M25561 Pain in right knee: Secondary | ICD-10-CM

## 2019-01-04 DIAGNOSIS — G629 Polyneuropathy, unspecified: Secondary | ICD-10-CM

## 2019-01-04 NOTE — Procedures (Signed)
[facility]  [address], [address]  [place], [place] [address] Tel: [phone] Fax:  [phone] Test Date:  01/04/2019  Patient: Sara BowensRebecca [name] DOB: 01/21/1941 Physician: Nita Sickleonika , DO  Sex: Female Height: 5\' 5"  Ref Phys: Margarita Ranaimothy Murphy, MD  ID#: [id number] Temp: 32.0C Technician:    Patient Complaints: This is an 78 year old female with history of neuropathy referred for evaluation of her right leg pain.  NCV & EMG Findings: Extensive electrodiagnostic testing of the right lower extremity and additional studies of the left shows:  1. Bilateral sural and superficial peroneal sensory responses are absent. 2. Right peroneal motor response shows reduced amplitude at the extensor digitorum brevis and tibialis anterior (R2.0, R2.1 mV).  Bilateral tibial and left peroneal motor responses are within normal limits.   3. Right tibial H reflex study is within normal limits.   4. Chronic motor axonal loss changes are seen affecting the L5 myotome, without accompanied active denervation.    Impression: 1. 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 2. Mild chronic L5 radiculopathy affecting the right lower extremity is new.    ___________________________ Nita Sickleonika , DO    Nerve Conduction Studies Anti Sensory Summary Table   Site NR Peak (ms) Norm Peak (ms) P-T Amp (V) Norm P-T Amp  Left Sup Peroneal Anti Sensory (Ant Lat Mall)  12 cm NR  <4.6  >3  Right Sup Peroneal Anti Sensory (Ant Lat Mall)  12 cm NR  <4.6  >3  Left Sural Anti Sensory (Lat Mall)  Calf NR  <4.6  >3  Right Sural Anti Sensory (Lat Mall)  Calf NR  <4.6  >3   Motor Summary Table   Site NR Onset (ms) Norm Onset (ms) O-P Amp (mV) Norm O-P Amp Site1 Site2 Delta-0 (ms) Dist (cm) Vel (m/s) Norm Vel (m/s)  Left Peroneal Motor (Ext Dig Brev)  Ankle    3.9 <6.0 2.8 >2.5 B Fib Ankle 7.9  35.0 44 >40  B Fib    11.8  2.7  Poplt B Fib 1.4 8.0 57 >40  Poplt    13.2  2.6         Right Peroneal Motor (Ext Dig Brev)  Ankle    3.3 <6.0 2.0 >2.5 B Fib Ankle 8.2 37.0 45 >40  B Fib    11.5  1.8  Poplt B Fib 1.8 9.0 50 >40  Poplt    13.3  1.8         Left Peroneal TA Motor (Tib Ant)  Fib Head    2.3 <4.5 3.2 >3 Poplit Fib Head 1.6 8.0 50 >40  Poplit    3.9  3.2         Right Peroneal TA Motor (Tib Ant)  Fib Head    3.3 <4.5 2.1 >3 Poplit Fib Head 1.3 8.0 62 >40  Poplit    4.6  2.0         Left Tibial Motor (Abd Hall Brev)  Ankle    3.8 <6.0 10.2 >4 Knee Ankle 8.3 40.0 48 >40  Knee    12.1  9.9         Right Tibial Motor (Abd Hall Brev)  Ankle    3.4 <6.0 9.5 >4 Knee Ankle 8.4 39.0 46 >40  Knee    11.8  8.4          H Reflex Studies   NR  H-Lat (ms) Lat Norm (ms) L-R H-Lat (ms)  Right Tibial (Gastroc)     34.83 <35    EMG   Side Muscle Ins Act Fibs Psw Fasc Number Recrt Dur Dur. Amp Amp. Poly Poly. Comment  Right AntTibialis Nml Nml Nml Nml 1- Rapid Some 1+ Some 1+ Nml Nml N/A  Right Gastroc Nml Nml Nml Nml Nml Nml Nml Nml Nml Nml Nml Nml N/A  Right Flex Dig Long Nml Nml Nml Nml 1- Rapid Some 1+ Some 1+ Nml Nml N/A  Right RectFemoris Nml Nml Nml Nml Nml Nml Nml Nml Nml Nml Nml Nml N/A  Right GluteusMed Nml Nml Nml Nml 1- Rapid Some 1+ Some 1+ Nml Nml N/A  Left AntTibialis Nml Nml Nml Nml Nml Nml Nml Nml Nml Nml Nml Nml N/A      Waveforms:

## 2019-01-05 ENCOUNTER — Other Ambulatory Visit: Payer: Self-pay

## 2019-01-09 ENCOUNTER — Ambulatory Visit
Admission: RE | Admit: 2019-01-09 | Discharge: 2019-01-09 | Disposition: A | Discharge status: Home / Self Care | Payer: Medicare Other | Source: Ambulatory Visit | Attending: Orthopedic Surgery | Admitting: Orthopedic Surgery

## 2019-01-09 ENCOUNTER — Other Ambulatory Visit: Payer: Self-pay

## 2019-01-09 ENCOUNTER — Other Ambulatory Visit: Payer: Self-pay | Admitting: Internal Medicine

## 2019-01-09 DIAGNOSIS — S83249A Other tear of medial meniscus, current injury, unspecified knee, initial encounter: Secondary | ICD-10-CM

## 2019-01-09 MED ORDER — IOPAMIDOL (ISOVUE-M 200) INJECTION 41%
35.0000 mL | Freq: Once | INTRAMUSCULAR | Status: AC
  Administered 2019-01-09: 35 mL via INTRA_ARTICULAR

## 2019-01-11 ENCOUNTER — Telehealth: Payer: Self-pay

## 2019-01-11 NOTE — Telephone Encounter (Signed)
Patient called to report developing itching and hives after CT arthrogram of knee two days ago (01/09/19).  She stated she will go buy some Benadryl, and I suggested she take two of them (if she can tolerate it and has nowhere to go today) right away then one every six hours as needed and tolerated.  I also informed her she should consider calling her PCP tomorrow if the Benadryl isn't helping; they may consider a steroid to help her get over the reaction.  She stated an understanding of all of this information and was very thankful!

## 2019-01-17 ENCOUNTER — Telehealth: Payer: Self-pay

## 2019-01-17 NOTE — Telephone Encounter (Signed)
Okay, I will see her 01/19/2019

## 2019-01-17 NOTE — Telephone Encounter (Signed)
Surgical clearance form received from Raliegh Ip- Pt needing R TKA w/ Dr. Edmonia Lynch on 02/06/2019. Surgical Clearance appt scheduled w/ PCP on 01/19/2019 at 1:40pm. Form placed in PCP red folder for completion.

## 2019-01-19 ENCOUNTER — Other Ambulatory Visit: Payer: Self-pay

## 2019-01-19 ENCOUNTER — Encounter: Payer: Self-pay | Admitting: Internal Medicine

## 2019-01-19 ENCOUNTER — Ambulatory Visit (INDEPENDENT_AMBULATORY_CARE_PROVIDER_SITE_OTHER): Payer: Medicare Other | Admitting: Internal Medicine

## 2019-01-19 VITALS — BP 137/72 | HR 91 | Temp 96.4°F | Resp 16 | Ht 65.0 in | Wt 144.0 lb

## 2019-01-19 DIAGNOSIS — Z01818 Encounter for other preprocedural examination: Secondary | ICD-10-CM

## 2019-01-19 DIAGNOSIS — Z23 Encounter for immunization: Secondary | ICD-10-CM | POA: Diagnosis not present

## 2019-01-19 DIAGNOSIS — I10 Essential (primary) hypertension: Secondary | ICD-10-CM

## 2019-01-19 DIAGNOSIS — M25561 Pain in right knee: Secondary | ICD-10-CM

## 2019-01-19 DIAGNOSIS — M1711 Unilateral primary osteoarthritis, right knee: Secondary | ICD-10-CM | POA: Diagnosis present

## 2019-01-19 DIAGNOSIS — R739 Hyperglycemia, unspecified: Secondary | ICD-10-CM

## 2019-01-19 DIAGNOSIS — Z09 Encounter for follow-up examination after completed treatment for conditions other than malignant neoplasm: Secondary | ICD-10-CM

## 2019-01-19 DIAGNOSIS — F329 Major depressive disorder, single episode, unspecified: Secondary | ICD-10-CM

## 2019-01-19 DIAGNOSIS — F419 Anxiety disorder, unspecified: Secondary | ICD-10-CM

## 2019-01-19 DIAGNOSIS — R21 Rash and other nonspecific skin eruption: Secondary | ICD-10-CM

## 2019-01-19 DIAGNOSIS — G8929 Other chronic pain: Secondary | ICD-10-CM

## 2019-01-19 DIAGNOSIS — K219 Gastro-esophageal reflux disease without esophagitis: Secondary | ICD-10-CM | POA: Diagnosis present

## 2019-01-19 DIAGNOSIS — Z8673 Personal history of transient ischemic attack (TIA), and cerebral infarction without residual deficits: Secondary | ICD-10-CM

## 2019-01-19 NOTE — Patient Instructions (Signed)
GO TO THE LAB : Get the blood work     GO TO THE FRONT DESK Schedule your next appointment for a   checkup in 4 months  

## 2019-01-19 NOTE — Progress Notes (Signed)
Pre visit review using our clinic review tool, if applicable. No additional management support is needed unless otherwise documented below in the visit note. 

## 2019-01-19 NOTE — Progress Notes (Addendum)
Subjective:    Patient ID: Sara [name], female    DOB: 1940/07/21, 78 y.o.   MRN: [id number]  DOS:  01/19/2019 Type of visit - description: Follow-up Patient needs a right TKR, request clearance. Rash: Not better, currently is located only at the right leg.  Rash coincide with the use of brace.  Wonders if this is a contact dermatitis   Depression?   Cymbalta, intolerant, she thinks that if she is able to control her knee pain she will be okay   Review of Systems Denies fever chills No cough No nausea, vomiting, diarrhea Her mobility is limited, she uses walker, able to go 30 or 40 yards without stopping.  The limiting factor is knee pain, no chest pain or DOE.    Past Medical History:  Diagnosis Date  . Anxiety   . Arthritis   . Bronchitis    hx of  . Carotid artery occlusion   . Chronic pain syndrome    pain management  . Coronary artery disease    sees Dr. Patty Sermons  . Dementia (HCC)   . GERD (gastroesophageal reflux disease)    hx of  . H/O hiatal hernia   . Hypercholesteremia   . Hypertension    all managed by Dr. Haynes Dage bill  . Mitral valve regurgitation    trace  . Non-traumatic compression fracture of thoracic vertebra (HCC)    T9  . Pneumonia 05/27/2017  . Stroke (HCC)   . Wears dentures   . Wears glasses     Past Surgical History:  Procedure Laterality Date  . BACK SURGERY     x 3 Dr Juliene Pina, last @ [facility] ~ 2005, rods, fusion, four surgery  . CARDIAC CATHETERIZATION  1994  . CAROTID ENDARTERECTOMY    . CARPAL TUNNEL RELEASE     left  . CHOLECYSTECTOMY    . ENDARTERECTOMY  04/24/2012   Procedure: ENDARTERECTOMY CAROTID;  Surgeon: Larina Earthly, MD;  Location: [facility] OR;  Service: Vascular;  Laterality: Left;  . HAND SURGERY     bilateral  . HIP FRACTURE SURGERY Left   . HIP PINNING,CANNULATED Left 02/08/2015   Procedure: CANNULATED HIP PINNING;  Surgeon: Kathryne Hitch, MD;  Location: [facility] ORS;  Service: Orthopedics;  Laterality: Left;  .  HIP PINNING,CANNULATED Right 03/31/2016   Procedure: CANNULATED HIP PINNING;  Surgeon: Kathryne Hitch, MD;  Location: [facility] ORS;  Service: Orthopedics;  Laterality: Right;  . HYSTEROTOMY    . KNEE ARTHROSCOPY Right 02/14/2018   Procedure: RIGHT KNEE ARTHROSCOPY WITH POSSIBLE PARTIAL MENISCECTOMY;  Surgeon: Kathryne Hitch, MD;  Location: [facility] OR;  Service: Orthopedics;  Laterality: Right;  . MULTIPLE TOOTH EXTRACTIONS    . SPINAL CORD STIMULATOR INSERTION    . TEE WITHOUT CARDIOVERSION  04/12/2012   Procedure: TRANSESOPHAGEAL ECHOCARDIOGRAM (TEE);  Surgeon: Lewayne Bunting, MD;  Location: [facility] ENDOSCOPY;  Service: Cardiovascular;  Laterality: N/A;  . TONSILLECTOMY      Social History   Socioeconomic History  . Marital status: Married    Spouse name: Not on file  . Number of children: 4  . Years of education: Not on file  . Highest education level: Not on file  Occupational History  . Occupation: retired   Engineer, production  . Financial resource strain: Not on file  . Food insecurity    Worry: Not on file    Inability: Not on file  . Transportation needs    Medical: Not on file  Non-medical: Not on file  Tobacco Use  . Smoking status: Former Smoker    Packs/day: 0.50    Years: 60.00    Pack years: 30.00    Types: Cigarettes  . Smokeless tobacco: Never Used  . Tobacco comment: quit 04-2015  Substance and Sexual Activity  . Alcohol use: No    Alcohol/week: 0.0 standard drinks  . Drug use: No  . Sexual activity: Not Currently  Lifestyle  . Physical activity    Days per week: Not on file    Minutes per session: Not on file  . Stress: Not on file  Relationships  . Social Musician on phone: Not on file    Gets together: Not on file    Attends religious service: Not on file    Active member of club or organization: Not on file    Attends meetings of clubs or organizations: Not on file    Relationship status: Not on file  . Intimate partner violence     Fear of current or ex partner: Not on file    Emotionally abused: Not on file    Physically abused: Not on file    Forced sexual activity: Not on file  Other Topics Concern  . Not on file  Social History Narrative   Lives w/ husband in a one story home.     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   Education: high school      Allergies as of 01/19/2019      Reactions   Penicillins Other (See Comments)   Has patient had a PCN reaction causing immediate rash, facial/tongue/throat swelling, SOB or lightheadedness with hypotension: # # # Yes # # # Has patient had a PCN reaction causing severe rash involving mucus membranes or skin necrosis: No Has patient had a PCN reaction that required hospitalization No Has patient had a PCN reaction occurring within the last 10 years: No If all of the above answers are "NO", then may proceed with Cephalosporin use.   Iodinated Diagnostic Agents Hives, Itching   01/11/19: Had CT arthrogram 01/09/19 and developed itching/hives after.  Needs 13-hour prep in the future.   Morphine And Related Rash   Percocet [oxycodone-acetaminophen] Rash   Tolerates APAP (including IV), morphine, and Hydrocodone/APAP   Hydrochlorothiazide Other (See Comments)   Pt does not remember   Indomethacin    Pt does not remember   Paroxetine Hcl    Pt does not remember   Pravachol Other (See Comments)   Pt does not remember   Quinine Derivatives    Pt does not remember   Wellbutrin [bupropion]    agitation   Zocor [simvastatin - High Dose] Other (See Comments)   Weakness/no energy      Medication List       Accurate as of January 19, 2019 11:59 PM. If you have any questions, ask your nurse or doctor.        amLODipine 10 MG tablet Commonly known as: NORVASC Take 1 tablet (10 mg total) by mouth daily.   aspirin EC 81 MG tablet Take 81 mg by mouth daily.   atorvastatin 40 MG tablet Commonly known as: LIPITOR Take 0.5 tablets  (20 mg total) by mouth daily.   diazepam 5 MG tablet Commonly known as: VALIUM TAKE 1 TABLET BY MOUTH EVERY DAY AS NEEDED   diphenhydrAMINE 25 mg capsule Commonly known as: BENADRYL  Take 25 mg by mouth every 6 (six) hours as needed (runny nose).   donepezil 10 MG tablet Commonly known as: ARICEPT Take 1 tablet (10 mg total) by mouth at bedtime.   DULoxetine 30 MG capsule Commonly known as: Cymbalta Take 1 capsule (30 mg total) by mouth 2 (two) times daily.   gabapentin 300 MG capsule Commonly known as: NEURONTIN TAKE 1 CAPSULE BY MOUTH EVERY MORNING AND 2 CAPSULE BY MOUTH EVERY NIGHT AT BEDTIME   hydrocortisone 2.5 % cream Apply topically 2 (two) times daily.   Hysingla ER 40 MG T24a Generic drug: HYDROcodone Bitartrate ER Take 40 mg by mouth at bedtime.   lidocaine-prilocaine cream Commonly known as: EMLA   loratadine 10 MG tablet Commonly known as: CLARITIN Take 10 mg by mouth daily.   metoprolol tartrate 50 MG tablet Commonly known as: LOPRESSOR Take 0.5 tablets (25 mg total) by mouth 2 (two) times daily.   oxyCODONE-acetaminophen 7.5-325 MG tablet Commonly known as: PERCOCET Take 1 tablet by mouth every 6 (six) hours as needed for moderate pain or severe pain.   pantoprazole 40 MG tablet Commonly known as: PROTONIX Take 1 tablet (40 mg total) by mouth daily.   vitamin B-12 1000 MCG tablet Commonly known as: CYANOCOBALAMIN Take 1,000 mcg by mouth daily.           Objective:   Physical Exam BP 137/72 (BP Location: Left Arm, Patient Position: Sitting, Cuff Size: Small)   Pulse 91   Temp (!) 96.4 F (35.8 C) (Temporal)   Resp 16   Ht 5\' 5"  (1.651 m)   Wt 144 lb (65.3 kg)   SpO2 98%   BMI 23.96 kg/m  General:   Well developed, NAD, BMI noted.  HEENT:  Normocephalic . Face symmetric, atraumatic Lungs:  CTA B Normal respiratory effort, no intercostal retractions, no accessory muscle use. Heart: RRR, soft murmur.  no pretibial edema bilaterally  Good pedal pulses on the right. Abdomen:  Not distended, soft, non-tender. No rebound or rigidity.   Skin: Few skin lesions, irregular shaped, around 1 cm in diameter, almost seems like a punched out lesion.  No blisters. Neurologic:  alert & oriented X3.  Speech normal, gait appropriate for age and unassisted Psych--  Cognition and judgment appear intact.  Cooperative with normal attention span and concentration.  Behavior appropriate. No anxious or depressed appearing.     Assessment     Assessment Pre-diabetes-- a1c 5.9 2013 Neuropathy , NCS 12-2015 and 12/2018  purely sensory, axonal  polyneuropathy  HTN Hyperlipidemia Anxiety/depression, rarely uses diazepam. Self d/c ssri trial ~ 05/2018 Mild Dementia CV --CAD  --H/o Stroke 2013:non hemorrhagic infarcts scattered throughout the [place] Hem[place] . 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 Chart review, a year ago, had a  Low risk nuclear stress test and a  Echocardiogram: Normal EF, tricuspid regurgitation likely to be the source of murmur.   EKG today NSR, no change compared to previous Given age and multiple other issues she remains moderate risk for complication in my opinion. Plan:  --Clear from the medical standpoint, will send a message to cardiology (needs their clearance) --01/23/2019, addendum: Message from Dr. Marlou Porch cardiology, he is clearing patient based on last stress test and lack of symptoms. HTN: CMP,CBC, Hyperglycemia: Check K A1c Rash: See last visit, today she reports that the rash is limited to the right leg, started about the same time she started using a brace, contact dermatitis?  That is possible, she plans to put a barrier between the brace and her skin.  Continue hydrocortisone. Call if not better, dermatology referral? Depression: see last OV, cymbalta intolerant, patient believes once knee pain decreases she will be fine emotionally.  I agree. RTC 4 months

## 2019-01-19 NOTE — H&P (Signed)
KNEE ARTHROPLASTY ADMISSION H&P  Patient ID: MERTHA CLYATT MRN: [id number] DOB/AGE: 07/17/1940 78 y.o.  Chief Complaint: right knee pain.  Planned Procedure Date: 02/06/19  Medical Clearance by Dr. Drue Novel (PENDING)   Additional clearance by PM&R: Dr Ardell Isaacs   HPI:  TEMPRANCE WYRE is a 78 y.o. female with a history of CVA 2013, endarterectomy, infrarenal AAA, chronic back pain, HTN, HLD, GERD who presents for evaluation of OA RIGHT KNEE. The patient has a history of pain and functional disability in the right knee due to arthritis and has failed non-surgical conservative treatments for greater than 12 weeks to include NSAID's and/or analgesics, corticosteriod injections, use of assistive devices and activity modification.  Onset of symptoms was gradual, starting 4 + years ago with gradually worsening course since that time.  Patient has had prior right knee arthroscopy by Dr. Magnus Ivan in 2016 for posterior medial meniscus tear-grade III chondromalacia seen at that time.  She feels consistent instability in the knee.  Images show significant anterior subluxation of her tibia suggesting nonfunctioning ACL.  She has been wearing a brace.  Patient currently rates pain at 9 out of 10 with activity. Patient has night pain, worsening of pain with activity and weight bearing and pain that interferes with activities of daily living.  Patient has evidence of periarticular osteophytes, joint subluxation and joint space narrowing by imaging studies.  There is no active infection.  Past Medical History:  Diagnosis Date  . Anxiety   . Arthritis   . Bronchitis    hx of  . Carotid artery occlusion   . Chronic pain syndrome    pain management  . Coronary artery disease    sees Dr. Patty Sermons  . Dementia (HCC)   . GERD (gastroesophageal reflux disease)    hx of  . H/O hiatal hernia   . Hypercholesteremia   . Hypertension    all managed by Dr. Haynes Dage bill  . Mitral valve regurgitation    trace  .  Non-traumatic compression fracture of thoracic vertebra (HCC)    T9  . Pneumonia 05/27/2017  . Stroke (HCC)   . Wears dentures   . Wears glasses    Past Surgical History:  Procedure Laterality Date  . BACK SURGERY     x 3 Dr Juliene Pina, last @ [facility] ~ 2005, rods, fusion, four surgery  . CARDIAC CATHETERIZATION  1994  . CAROTID ENDARTERECTOMY    . CARPAL TUNNEL RELEASE     left  . CHOLECYSTECTOMY    . ENDARTERECTOMY  04/24/2012   Procedure: ENDARTERECTOMY CAROTID;  Surgeon: Larina Earthly, MD;  Location: [facility] OR;  Service: Vascular;  Laterality: Left;  . HAND SURGERY     bilateral  . HIP FRACTURE SURGERY Left   . HIP PINNING,CANNULATED Left 02/08/2015   Procedure: CANNULATED HIP PINNING;  Surgeon: Kathryne Hitch, MD;  Location: [facility] ORS;  Service: Orthopedics;  Laterality: Left;  . HIP PINNING,CANNULATED Right 03/31/2016   Procedure: CANNULATED HIP PINNING;  Surgeon: Kathryne Hitch, MD;  Location: [facility] ORS;  Service: Orthopedics;  Laterality: Right;  . HYSTEROTOMY    . KNEE ARTHROSCOPY Right 02/14/2018   Procedure: RIGHT KNEE ARTHROSCOPY WITH POSSIBLE PARTIAL MENISCECTOMY;  Surgeon: Kathryne Hitch, MD;  Location: [facility] OR;  Service: Orthopedics;  Laterality: Right;  . MULTIPLE TOOTH EXTRACTIONS    . SPINAL CORD STIMULATOR INSERTION    . TEE WITHOUT CARDIOVERSION  04/12/2012   Procedure: TRANSESOPHAGEAL ECHOCARDIOGRAM (TEE);  Surgeon: Lewayne Bunting, MD;  Location: [facility] ENDOSCOPY;  Service: Cardiovascular;  Laterality: N/A;  . TONSILLECTOMY     Allergies  Allergen Reactions  . Penicillins Other (See Comments)    Has patient had a PCN reaction causing immediate rash, facial/tongue/throat swelling, SOB or lightheadedness with hypotension: # # # Yes # # # Has patient had a PCN reaction causing severe rash involving mucus membranes or skin necrosis: No Has patient had a PCN reaction that required hospitalization No Has patient had a PCN reaction occurring within the  last 10 years: No If all of the above answers are "NO", then may proceed with Cephalosporin use.   . Iodinated Diagnostic Agents Hives and Itching    01/11/19: Had CT arthrogram 01/09/19 and developed itching/hives after.  Needs 13-hour prep in the future.  . Morphine And Related Rash  . Percocet [Oxycodone-Acetaminophen] Rash    Tolerates APAP (including IV), morphine, and Hydrocodone/APAP  . Hydrochlorothiazide Other (See Comments)    Pt does not remember  . Indomethacin     Pt does not remember  . Paroxetine Hcl     Pt does not remember  . Pravachol Other (See Comments)    Pt does not remember  . Quinine Derivatives     Pt does not remember  . Wellbutrin [Bupropion]     agitation  . Zocor [Simvastatin - High Dose] Other (See Comments)    Weakness/no energy   Prior to Admission medications   Medication Sig Start Date End Date Taking? Authorizing Provider  amLODipine (NORVASC) 10 MG tablet Take 1 tablet (10 mg total) by mouth daily. 10/11/18   Wanda PlumpPaz, Jose E, MD  aspirin EC 81 MG tablet Take 81 mg by mouth daily.    [provider]  atorvastatin (LIPITOR) 40 MG tablet Take 0.5 tablets (20 mg total) by mouth daily. 12/28/18   Wanda PlumpPaz, Jose E, MD  diazepam (VALIUM) 5 MG tablet TAKE 1 TABLET BY MOUTH EVERY DAY AS NEEDED 10/30/18   Wanda PlumpPaz, Jose E, MD  diphenhydrAMINE (BENADRYL) 25 mg capsule Take 25 mg by mouth every 6 (six) hours as needed (runny nose).    [provider]  donepezil (ARICEPT) 10 MG tablet Take 1 tablet (10 mg total) by mouth at bedtime. 10/11/18   Wanda PlumpPaz, Jose E, MD  DULoxetine (CYMBALTA) 30 MG capsule Take 1 capsule (30 mg total) by mouth 2 (two) times daily. 12/21/18   Wanda PlumpPaz, Jose E, MD  gabapentin (NEURONTIN) 300 MG capsule TAKE 1 CAPSULE BY MOUTH EVERY MORNING AND 2 CAPSULE BY MOUTH EVERY NIGHT AT BEDTIME 12/28/18   Wanda PlumpPaz, Jose E, MD  hydrocortisone 2.5 % cream Apply topically 2 (two) times daily. 12/21/18 12/21/19  Wanda PlumpPaz, Jose E, MD  [facility] Allied DiseasesYSINGLA ER 40 MG T24A Take 40 mg by mouth  at bedtime.  09/16/16   [provider]  lidocaine-prilocaine (EMLA) cream  05/09/18   [provider]  loratadine (CLARITIN) 10 MG tablet Take 10 mg by mouth daily.    [provider]  metoprolol tartrate (LOPRESSOR) 50 MG tablet Take 0.5 tablets (25 mg total) by mouth 2 (two) times daily. 10/11/18   Wanda PlumpPaz, Jose E, MD  oxyCODONE-acetaminophen (PERCOCET) 7.5-325 MG tablet Take 1 tablet by mouth every 6 (six) hours as needed for moderate pain or severe pain. 02/14/18   Kathryne HitchBlackman, Christopher Y, MD  pantoprazole (PROTONIX) 40 MG tablet Take 1 tablet (40 mg total) by mouth daily. 12/04/18   Wanda PlumpPaz, Jose E, MD  vitamin B-12 (CYANOCOBALAMIN) 1000 MCG tablet Take 1,000 mcg by  mouth daily.    [provider]   Social History   Socioeconomic History  . Marital status: Married    Spouse name: Not on file  . Number of children: 4  . Years of education: Not on file  . Highest education level: Not on file  Occupational History  . Occupation: retired   Engineer, production  . Financial resource strain: Not on file  . Food insecurity    Worry: Not on file    Inability: Not on file  . Transportation needs    Medical: Not on file    Non-medical: Not on file  Tobacco Use  . Smoking status: Former Smoker    Packs/day: 0.50    Years: 60.00    Pack years: 30.00    Types: Cigarettes  . Smokeless tobacco: Never Used  . Tobacco comment: quit 04-2015  Substance and Sexual Activity  . Alcohol use: No    Alcohol/week: 0.0 standard drinks  . Drug use: No  . Sexual activity: Not Currently  Lifestyle  . Physical activity    Days per week: Not on file    Minutes per session: Not on file  . Stress: Not on file  Relationships  . Social Musician on phone: Not on file    Gets together: Not on file    Attends religious service: Not on file    Active member of club or organization: Not on file    Attends meetings of clubs or organizations: Not on file    Relationship  status: Not on file  Other Topics Concern  . Not on file  Social History Narrative   Lives w/ husband in a one story home.     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   Education: high school   Family History  Problem Relation Age of Onset  . Heart attack Father   . Heart disease Father        before age 84  . Heart disease Sister   . Cancer Sister   . Heart disease Sister     ROS: Currently denies lightheadedness, dizziness, Fever, chills, CP, SOB.   No personal history of DVT, PE, MI.   No loose teeth.  Upper dentures.  Wears reading glasses.  Chronic R>L LE edema. All other systems have been reviewed and were otherwise currently negative with the exception of those mentioned in the HPI and as above.  Objective: Vitals: Ht: 5'5" Wt: 143 Temp: 97.9 BP: 134/62 Pulse: 82 O2 96% on room air.   Physical Exam: General: Alert, NAD.  Antalgic Gait.  Using 3 wheeled rolling walker. HEENT: EOMI, Good Neck Extension  Pulm: No increased work of breathing.  Clear B/L A/P w/o crackle or wheeze.  CV: RRR, No m/g/r appreciated  GI: soft, NT, ND Neuro: Neuro without gross focal deficit.  Sensation intact distally Skin: No lesions in the area of chief complaint MSK/Surgical Site: RLE w/ scattered superficial round shallow red lesions w/out surrounding erythema or sign of infection.   Right knee w/o redness or effusion.  + JLT. ROM 0-120.  4/5 strength in extension and flexion.  +EHL/FHL.  Lax to anterior drawer.  Stable to varus/valgus stress.   Imaging Review Plain radiographs demonstrate severe degenerative joint disease of the right knee.   Preoperative templating of the joint replacement has been completed, documented, and submitted to the Operating Room personnel in order to  optimize intra-operative equipment management.  Assessment: OA RIGHT KNEE Principal Problem:   Primary osteoarthritis of right knee Active Problems:   Hypertension    Hypercholesteremia   Chronic back pain   AAA (abdominal aortic aneurysm) without rupture (HCC)   History of CVA (cerebrovascular accident)   GERD (gastroesophageal reflux disease)   Plan: Plan for Procedure(s): TOTAL KNEE ARTHROPLASTY  The patient history, physical exam, clinical judgement of the provider and imaging are consistent with end stage degenerative joint disease and total joint arthroplasty is deemed medically necessary. The treatment options including medical management, injection therapy, and arthroplasty were discussed at length. The risks and benefits of Procedure(s): TOTAL KNEE ARTHROPLASTY were presented and reviewed.  The risks of nonoperative treatment, versus surgical intervention including but not limited to continued pain, aseptic loosening, stiffness, dislocation/subluxation, infection, bleeding, nerve injury, blood clots, cardiopulmonary complications, morbidity, mortality, among others were discussed. The patient verbalizes understanding and wishes to proceed with the plan.  Patient is being admitted for inpatient treatment for surgery, pain control, PT, prophylactic antibiotics, VTE prophylaxis, progressive ambulation, ADL's and discharge planning.   Dental prophylaxis discussed and recommended for 2 years postoperatively.   PCP clearance pending.  She also has lesions of unknown etiology on her right leg - not directly in incisional area but on her right leg.  I am suspicious that it is a superficial reaction to her knee brace and advised applying over clothing or using a cotton sleeve as a barrier.  May need further evaluation by PCP and/or derm/vascular prior to surgery if this does not improve.  The patient does meet the criteria for TXA which will be used perioperatively.    ASA 81 mg BID will be used postoperatively for DVT prophylaxis in addition to SCDs, and early ambulation.  Plan for Tylenol, 100 mg Celebrex, oxycodone for pain.  She will continue her  chronic Oxycodone and Hysingla as well as Gabapentin.  Baclofen for spasm.  Omeprazole for gastric protection (she recently d/c'd protonix).  The patient is planning to be discharged home with HHPT ([facility]) in care of her daughter Otila Kluver and help from a neighbor (retired [employer], hospital).  Anticipated LOS less than 2 midnights.  Insurance approval for inpatient due to: - Age 8 and older with one or more of the following:  - Expected need for hospital services (PT, OT, Nursing) required for safe  discharge  - Active co-morbidities: Chronic pain requiring opiods, Coronary Artery Disease and Stroke   Prudencio Burly III, PA-C 01/19/2019 8:56 AM

## 2019-01-20 LAB — CBC WITH DIFFERENTIAL/PLATELET
Absolute Monocytes: 533 cells/uL (ref 200–950)
Basophils Absolute: 43 cells/uL (ref 0–200)
Basophils Relative: 0.6 %
Eosinophils Absolute: 213 cells/uL (ref 15–500)
Eosinophils Relative: 3 %
HCT: 37.8 % (ref 35.0–45.0)
Hemoglobin: 12.4 g/dL (ref 11.7–15.5)
Lymphs Abs: 2513 cells/uL (ref 850–3900)
MCH: 30.2 pg (ref 27.0–33.0)
MCHC: 32.8 g/dL (ref 32.0–36.0)
MCV: 92 fL (ref 80.0–100.0)
MPV: 10.5 fL (ref 7.5–12.5)
Monocytes Relative: 7.5 %
Neutro Abs: 3799 cells/uL (ref 1500–7800)
Neutrophils Relative %: 53.5 %
Platelets: 295 10*3/uL (ref 140–400)
RBC: 4.11 10*6/uL (ref 3.80–5.10)
RDW: 12.6 % (ref 11.0–15.0)
Total Lymphocyte: 35.4 %
WBC: 7.1 10*3/uL (ref 3.8–10.8)

## 2019-01-20 LAB — HEMOGLOBIN A1C
Hgb A1c MFr Bld: 5.5 % of total Hgb (ref <5.7)
Mean Plasma Glucose: 111 (calc)
eAG (mmol/L): 6.2 (calc)

## 2019-01-20 LAB — COMPREHENSIVE METABOLIC PANEL
AG Ratio: 1.7 (calc) (ref 1.0–2.5)
ALT: 10 U/L (ref 6–29)
AST: 13 U/L (ref 10–35)
Albumin: 3.8 g/dL (ref 3.6–5.1)
Alkaline phosphatase (APISO): 81 U/L (ref 37–153)
BUN/Creatinine Ratio: 18 (calc) (ref 6–22)
BUN: 20 mg/dL (ref 7–25)
CO2: 26 mmol/L (ref 20–32)
Calcium: 8.4 mg/dL — ABNORMAL LOW (ref 8.6–10.4)
Chloride: 104 mmol/L (ref 98–110)
Creat: 1.14 mg/dL — ABNORMAL HIGH (ref 0.60–0.88)
Globulin: 2.3 g/dL (calc) (ref 1.9–3.7)
Glucose, Bld: 140 mg/dL — ABNORMAL HIGH (ref 65–99)
Potassium: 4.1 mmol/L (ref 3.5–5.3)
Sodium: 141 mmol/L (ref 135–146)
Total Bilirubin: 0.5 mg/dL (ref 0.2–1.2)
Total Protein: 6.1 g/dL (ref 6.1–8.1)

## 2019-01-20 NOTE — Assessment & Plan Note (Addendum)
Preop evaluation: The patient is 27, has multiple medical problems, needs a right knee replacement, pain is really decreasing her QOL. Chart review, a year ago, had a Low risk nuclear stress test and a  Echocardiogram: Normal EF, tricuspid regurgitation likely to be the source of murmur.   EKG today NSR, no change compared to previous Given age and multiple other issues she remains moderate risk for complication in my opinion. Plan:  --Clear from the medical standpoint, will send a message to cardiology (needs their clearance) --01/23/2019, addendum: Message from Dr. Marlou Porch cardiology, he is clearing patient based on last stress test and lack of symptoms. HTN: CMP,CBC, Hyperglycemia: Check K A1c Rash: See last visit, today she reports that the rash is limited to the right leg, started about the same time she started using a brace, contact dermatitis?  That is possible, she plans to put a barrier between the brace and her skin.  Continue hydrocortisone. Call if not better, dermatology referral? Depression: see last OV, cymbalta intolerant, patient believes once knee pain decreases she will be fine emotionally.  I agree. RTC 4 months

## 2019-01-23 NOTE — Telephone Encounter (Signed)
Received fax confirmation

## 2019-01-23 NOTE — Telephone Encounter (Signed)
Cleared per PCP. Form faxed to ATTN: Claiborne Billings at [phone]. Form sent for scanning.

## 2019-01-25 NOTE — Patient Instructions (Addendum)
DUE TO COVID-19 ONLY ONE VISITOR IS ALLOWED TO COME WITH YOU AND STAY IN THE WAITING ROOM ONLY DURING PRE OP AND PROCEDURE DAY OF SURGERY. THE 1 VISITOR MAY VISIT WITH YOU AFTER SURGERY IN YOUR PRIVATE ROOM DURING VISITING HOURS ONLY!  YOU NEED TO HAVE A COVID 19 TEST ON___Friday , October 9th ____ @___10 :00AM____, THIS TEST MUST BE DONE BEFORE SURGERY, COME  [address], [place] , [id number].  [facility]) ONCE YOUR COVID TEST IS COMPLETED, PLEASE BEGIN THE QUARANTINE INSTRUCTIONS AS OUTLINED IN YOUR HANDOUT.                Sara BrickRebecca S [name]    Your procedure is scheduled on: 02-06-19    [facility] Main  Entr[facility]    Report to Admitting at 10:25  AM     Call this number if you have problems the morning of surgery [phone]    Remember: NO SOLID FOOD AFTER MIDNIGHT THE NIGHT PRIOR TO SURGERY. NOTHING BY MOUTH EXCEPT CLEAR LIQUIDS UNTIL 9:55 AM . PLEASE FINISH ENSURE DRINK PER SURGEON ORDER  WHICH NEEDS TO BE COMPLETED AT 9:55 AM   CLEAR LIQUID DIET   Foods Allowed                                                                     Foods Excluded  Coffee and tea, regular and decaf                             liquids that you cannot  Plain Jell-O any favor except red or purple                                           see through such as: Fruit ices (not with fruit pulp)                                     milk, soups, orange juice  Iced Popsicles                                    All solid food Carbonated beverages, regular and diet                                    Cranberry, grape and apple juices Sports drinks like Gatorade Lightly seasoned clear broth or consume(fat free) Sugar, honey syrup  Sample Menu Breakfast                                Lunch                                     Supper Cranberry juice  Beef broth                            Chicken broth Jell-O                                     Grape juice                            Apple juice Coffee or tea                        Jell-O                                      Popsicle                                                Coffee or tea                        Coffee or tea  _____________________________________________________________________      Take these medicines the morning of surgery with A SIP OF WATER: Amlodipine (Norvasc), Atorvastatin (Lipitor), Diazepam (Valium), Duloxetine (Cymbalta), Gabapentin (Neurontin), Metoprolol Tartrate, Loratadine (Claritin), and Pantoprazole (Protonix)                                 You may not have any metal on your body including hair pins and              piercings    Do not wear jewelry, make-up, lotions, powders or perfumes, deodorant             Do not wear nail polish on your fingernails.  Do not shave  48 hours prior to surgery.     Do not bring valuables to the hospital. [facility] IS NOT             RESPONSIBLE   FOR VALUABLES.  Contacts, dentures or bridgework may not be worn into surgery.  YOU MAY BRING A SMALL OVERNIGHT BAG              Please read over the following fact sheets you were given: _____________________________________________________________________             [facility] - Preparing for Surgery Before surgery, you can play an important role.  Because skin is not sterile, your skin needs to be as free of germs as possible.  You can reduce the number of germs on your skin by washing with CHG (chlorahexidine gluconate) soap before surgery.  CHG is an antiseptic cleaner which kills germs and bonds with the skin to continue killing germs even after washing. Please DO NOT use if you have an allergy to CHG or antibacterial soaps.  If your skin becomes reddened/irritated stop using the CHG and inform your nurse when you arrive at Short Stay. Do not shave (including legs and underarms) for at least 48 hours prior to the first CHG shower.  You may shave your  face/neck. Please follow these instructions  carefully:  1.  Shower with CHG Soap the night before surgery and the  morning of Surgery.  2.  If you choose to wash your hair, wash your hair first as usual with your  normal  shampoo.  3.  After you shampoo, rinse your hair and body thoroughly to remove the  shampoo.                           4.  Use CHG as you would any other liquid soap.  You can apply chg directly  to the skin and wash                       Gently with a scrungie or clean washcloth.  5.  Apply the CHG Soap to your body ONLY FROM THE NECK DOWN.   Do not use on face/ open                           Wound or open sores. Avoid contact with eyes, ears mouth and genitals (private parts).                       Wash face,  Genitals (private parts) with your normal soap.             6.  Wash thoroughly, paying special attention to the area where your surgery  will be performed.  7.  Thoroughly rinse your body with warm water from the neck down.  8.  DO NOT shower/wash with your normal soap after using and rinsing off  the CHG Soap.                9.  Pat yourself dry with a clean towel.            10.  Wear clean pajamas.            11.  Place clean sheets on your bed the night of your first shower and do not  sleep with pets. Day of Surgery : Do not apply any lotions/deodorants the morning of surgery.  Please wear clean clothes to the hospital/surgery center.  FAILURE TO FOLLOW THESE INSTRUCTIONS MAY RESULT IN THE CANCELLATION OF YOUR SURGERY PATIENT SIGNATURE_________________________________  NURSE SIGNATURE__________________________________  ________________________________________________________________________   Sara [name]  An incentive spirometer is a tool that can help keep your lungs clear and active. This tool measures how well you are filling your lungs with each breath. Taking long deep breaths may help reverse or decrease the chance of developing breathing  (pulmonary) problems (especially infection) following:  A long period of time when you are unable to move or be active. BEFORE THE PROCEDURE   If the spirometer includes an indicator to show your best effort, your nurse or respiratory therapist will set it to a desired goal.  If possible, sit up straight or lean slightly forward. Try not to slouch.  Hold the incentive spirometer in an upright position. INSTRUCTIONS FOR USE  1. Sit on the edge of your bed if possible, or sit up as far as you can in bed or on a chair. 2. Hold the incentive spirometer in an upright position. 3. Breathe out normally. 4. Place the mouthpiece in your mouth and seal your lips tightly around it. 5. Breathe in slowly and as deeply as possible, raising the piston or the ball toward the top of  the column. 6. Hold your breath for 3-5 seconds or for as long as possible. Allow the piston or ball to fall to the bottom of the column. 7. Remove the mouthpiece from your mouth and breathe out normally. 8. Rest for a few seconds and repeat Steps 1 through 7 at least 10 times every 1-2 hours when you are awake. Take your time and take a few normal breaths between deep breaths. 9. The spirometer may include an indicator to show your best effort. Use the indicator as a goal to work toward during each repetition. 10. After each set of 10 deep breaths, practice coughing to be sure your lungs are clear. If you have an incision (the cut made at the time of surgery), support your incision when coughing by placing a pillow or rolled up towels firmly against it. Once you are able to get out of bed, walk around indoors and cough well. You may stop using the incentive spirometer when instructed by your caregiver.  RISKS AND COMPLICATIONS  Take your time so you do not get dizzy or light-headed.  If you are in pain, you may need to take or ask for pain medication before doing incentive spirometry. It is harder to take a deep breath if you  are having pain. AFTER USE  Rest and breathe slowly and easily.  It can be helpful to keep track of a log of your progress. Your caregiver can provide you with a simple table to help with this. If you are using the spirometer at home, follow these instructions: [place] IF:   You are having difficultly using the spirometer.  You have trouble using the spirometer as often as instructed.  Your pain medication is not giving enough relief while using the spirometer.  You develop fever of 100.5 F (38.1 C) or higher. SEEK IMMEDIATE MEDICAL CARE IF:   You cough up bloody sputum that had not been present before.  You develop fever of 102 F (38.9 C) or greater.  You develop worsening pain at or near the incision site. MAKE SURE YOU:   Understand these instructions.  Will watch your condition.  Will get help right away if you are not doing well or get worse. Document Released: 08/23/2006 Document Revised: 07/05/2011 Document Reviewed: 10/24/2006 [facility] Patient Information 2014 [place], [place].   ________________________________________________________________________

## 2019-01-27 ENCOUNTER — Other Ambulatory Visit: Payer: Self-pay | Admitting: Internal Medicine

## 2019-01-29 ENCOUNTER — Other Ambulatory Visit: Payer: Self-pay | Admitting: Internal Medicine

## 2019-01-29 ENCOUNTER — Encounter (HOSPITAL_COMMUNITY)
Admission: RE | Admit: 2019-01-29 | Discharge: 2019-01-29 | Disposition: A | Discharge status: Home / Self Care | Payer: Medicare Other | Source: Ambulatory Visit | Attending: Orthopedic Surgery | Admitting: Orthopedic Surgery

## 2019-01-29 ENCOUNTER — Other Ambulatory Visit: Payer: Self-pay

## 2019-01-29 ENCOUNTER — Encounter (HOSPITAL_COMMUNITY): Payer: Self-pay

## 2019-01-29 DIAGNOSIS — M1711 Unilateral primary osteoarthritis, right knee: Secondary | ICD-10-CM | POA: Diagnosis not present

## 2019-01-29 DIAGNOSIS — Z01818 Encounter for other preprocedural examination: Secondary | ICD-10-CM | POA: Insufficient documentation

## 2019-01-29 HISTORY — DX: Presence of other specified functional implants: Z96.89

## 2019-01-29 LAB — URINALYSIS, ROUTINE W REFLEX MICROSCOPIC
Bilirubin Urine: NEGATIVE
Glucose, UA: NEGATIVE mg/dL
Ketones, ur: NEGATIVE mg/dL
Leukocytes,Ua: NEGATIVE
Nitrite: NEGATIVE
Protein, ur: NEGATIVE mg/dL
Specific Gravity, Urine: 1.018 (ref 1.005–1.030)
pH: 5 (ref 5.0–8.0)

## 2019-01-29 LAB — CBC
HCT: 42 % (ref 36.0–46.0)
Hemoglobin: 13 g/dL (ref 12.0–15.0)
MCH: 30.4 pg (ref 26.0–34.0)
MCHC: 31 g/dL (ref 30.0–36.0)
MCV: 98.1 fL (ref 80.0–100.0)
Platelets: 287 10*3/uL (ref 150–400)
RBC: 4.28 MIL/uL (ref 3.87–5.11)
RDW: 13.1 % (ref 11.5–15.5)
WBC: 8.6 10*3/uL (ref 4.0–10.5)
nRBC: 0 % (ref 0.0–0.2)

## 2019-01-29 LAB — BASIC METABOLIC PANEL
Anion gap: 10 (ref 5–15)
BUN: 21 mg/dL (ref 8–23)
CO2: 27 mmol/L (ref 22–32)
Calcium: 9 mg/dL (ref 8.9–10.3)
Chloride: 104 mmol/L (ref 98–111)
Creatinine, Ser: 1.16 mg/dL — ABNORMAL HIGH (ref 0.44–1.00)
GFR calc Af Amer: 51 mL/min — ABNORMAL LOW (ref >60)
GFR calc non Af Amer: 44 mL/min — ABNORMAL LOW (ref >60)
Glucose, Bld: 116 mg/dL — ABNORMAL HIGH (ref 70–99)
Potassium: 3.8 mmol/L (ref 3.5–5.1)
Sodium: 141 mmol/L (ref 135–145)

## 2019-01-29 LAB — SURGICAL PCR SCREEN
MRSA, PCR: NEGATIVE
Staphylococcus aureus: NEGATIVE

## 2019-01-29 MED ORDER — PANTOPRAZOLE SODIUM 40 MG PO TBEC: 40.0000 mg | DELAYED_RELEASE_TABLET | Freq: Every day | ORAL | 3 refills | Status: DC

## 2019-01-29 NOTE — Progress Notes (Addendum)
PCP - Colon Branch, MD Cardiologist - Candee Furbish? Dr Larose Kells mentions are cardiologist in clearance progress note dated 01-19-2019 in epic   Chest x-ray -  EKG - epic 2020 Stress Test - epic 2019 ECHO - epic 2019 Cardiac Cath -   Sleep Study -  CPAP -   Fasting Blood Sugar -  Checks Blood Sugar _____ times a day  Blood Thinner Instructions: Aspirin Instructions: 81mg  , reports she is to hold x7 days  Last Dose: 01-28-2019  Anesthesia review:   Hx of stroke, htn  and AAA (last abd aorta US done 05-17-2018 epic ) , CAD . Has received medical and cardiac clearance per Dr Larose Kells progress note in epic 01-19-2019. Today she denies any acute cardiac symptoms. she does mention a healing rash on her RLE. She reports rash is due to contact reaction from her knee brace , denies pain nor itching at site of rash . There are 2 dime sized areas of skin covered by granualated tissue on anterior and posterior RLE near ankle. Area surrounding "scabs" are midlly erythematous. No pain on palpation nor noticeable drainage or inflammation.   she reports the sites have improved  since Dr Larose Kells assessed it in September when he cleared her for surgery . She has been avoiding scratching the area. Chart given to APP for review.    Patient denies shortness of breath, fever, cough and chest pain at PAT appointment   Patient verbalized understanding of instructions that were given to them at the PAT appointment. Patient was also instructed that they will need to review over the PAT instructions again at home before surgery.

## 2019-01-31 NOTE — Anesthesia Preprocedure Evaluation (Addendum)
Anesthesia Evaluation  Patient identified by MRN, date of birth, ID band Patient awake    Reviewed: Allergy & Precautions, H&P , NPO status , Patient's Chart, lab work & pertinent test results  Airway Mallampati: II   Neck ROM: full    Dental   Pulmonary former smoker,    breath sounds clear to auscultation       Cardiovascular hypertension, + CAD  + Valvular Problems/Murmurs MR  Rhythm:regular Rate:Normal     Neuro/Psych PSYCHIATRIC DISORDERS Anxiety Depression Dementia CVA    GI/Hepatic hiatal hernia, GERD  ,  Endo/Other    Renal/GU      Musculoskeletal  (+) Arthritis ,   Abdominal   Peds  Hematology   Anesthesia Other Findings   Reproductive/Obstetrics                            Anesthesia Physical Anesthesia Plan  ASA: III  Anesthesia Plan: General   Post-op Pain Management:  Regional for Post-op pain   Induction: Intravenous  PONV Risk Score and Plan: 3 and Propofol infusion and Treatment may vary due to age or medical condition  Airway Management Planned: LMA  Additional Equipment:   Intra-op Plan:   Post-operative Plan:   Informed Consent: I have reviewed the patients History and Physical, chart, labs and discussed the procedure including the risks, benefits and alternatives for the proposed anesthesia with the patient or authorized representative who has indicated his/her understanding and acceptance.       Plan Discussed with: CRNA, Anesthesiologist and Surgeon  Anesthesia Plan Comments: (See PAT note 01/29/2019, Konrad Felix, PA-C  Pt has h/o back surgery with hardware and a spinal cord stimulator.)      Anesthesia Quick Evaluation

## 2019-01-31 NOTE — Progress Notes (Signed)
Anesthesia Chart Review   Case: [id number] Date/Time: 02/06/19 1240   Procedure: TOTAL KNEE ARTHROPLASTY (Right Knee)   Anesthesia type: Choice   Pre-op diagnosis: OA RIGHT KNEE   [facility] ROOM 08 / [facility]   Surgeon: Sheral ApleyMurphy, Timothy D, MD      DISCUSSION:78 y.o. former smoker (30 pack years) with h/o Stroke, HTN, GERD, CAD, spinal cord stimulator in place, previous back surgery (L3-L5 fusion with hardware seen on MRI 09/24/2013), right knee OA scheduled for above procedure 02/06/2019 with Dr. Margarita Ranaimothy Murphy.   Pt seen by PCP, Dr. Willow OraJose Paz, 01/20/2019 for preoperative evaluation.  Per OV note, "The patient is [age], has multiple medical problems, needs a right knee replacement, pain is really decreasing her QOL. Chart review, a year ago, had a Low risk nuclear stress test and a  Echocardiogram: Normal EF, tricuspid regurgitation likely to be the source of murmur.   EKG today NSR, no change compared to previous Given age and multiple other issues she remains moderate risk for complication in my opinion. Plan:  --Clear from the medical standpoint, will send a message to cardiology (needs their clearance) --01/23/2019, addendum: Message from Dr. Anne FuSkains cardiology, he is clearing patient based on last stress test and lack of symptoms."  Anticipate pt can proceed with planned procedure barring acute status change.   VS: BP (!) 147/72   Pulse 74   Temp 36.6 C (Oral)   Resp 16   Ht 5\' 5"  (1.651 m)   Wt 65.8 kg   SpO2 100%   BMI 24.13 kg/m   PROVIDERS: Wanda PlumpPaz, Jose E, MD is PCP   Donato SchultzSkains, Mark, MD is Cardiologist  LABS: Labs reviewed: Acceptable for surgery. (all labs ordered are listed, but only abnormal results are displayed)  Labs Reviewed  BASIC METABOLIC PANEL - Abnormal; Notable for the following components:      Result Value   Glucose, Bld 116 (*)    Creatinine, Ser 1.16 (*)    GFR calc non Af Amer 44 (*)    GFR calc Af Amer 51 (*)    All other components within normal limits   URINALYSIS, ROUTINE W REFLEX MICROSCOPIC - Abnormal; Notable for the following components:   Hgb urine dipstick SMALL (*)    Bacteria, UA RARE (*)    All other components within normal limits  SURGICAL PCR SCREEN  CBC     IMAGES:   EKG: 01/19/2019 Rate 71 bpm Sinus rhythm with rate variation  RSR (V1)-nondiagnostic  Nonspecific T-abnormality   CV: Echo 02/01/2018 Study Conclusions  - Left ventricle: The cavity size was normal. There was mild   concentric hypertrophy. Systolic function was normal. The   estimated ejection fraction was in the range of 55% to 60%. Wall   motion was normal; there were no regional wall motion   abnormalities. Doppler parameters are consistent with abnormal   left ventricular relaxation (grade 1 diastolic dysfunction).   Doppler parameters are consistent with high ventricular filling   pressure. - Aortic valve: Transvalvular velocity was within the normal range.   There was no stenosis. There was no regurgitation. - Mitral valve: Transvalvular velocity was within the normal range.   There was no evidence for stenosis. There was no regurgitation. - Right ventricle: The cavity size was normal. Wall thickness was   normal. Systolic function was normal. - Atrial septum: No defect or patent foramen ovale was identified   by color flow Doppler. - Tricuspid valve: There was mild regurgitation. -  Pulmonary arteries: Systolic pressure was within the normal   range. PA peak pressure: 20 mm Hg (S).  Myocardial Perfusion Imaging 01/30/2018  Nuclear stress EF: 63%.  There was no ST segment deviation noted during stress.  The study is normal.  This is a low risk study.  The left ventricular ejection fraction is normal (55-65%).   Normal resting and stress perfusion. No ischemia or infarction EF 63% Past Medical History:  Diagnosis Date  . Anxiety   . Arthritis   . Bronchitis    hx of  . Carotid artery occlusion   . Chronic pain syndrome     pain management  . Coronary artery disease    sees Dr. Mare Ferrari  . Dementia ([place])   . GERD (gastroesophageal reflux disease)    hx of  . H/O hiatal hernia   . Hypercholesteremia   . Hypertension    all managed by Dr. Guss Bunde bill  . Mitral valve regurgitation    trace  . Non-traumatic compression fracture of thoracic vertebra (HCC)    T9  . Pneumonia 05/27/2017  . S/P insertion of spinal cord stimulator    "i just charge it every couple of weeks , i dont have a remote "   . Stroke ([place])   . Wears dentures   . Wears glasses     Past Surgical History:  Procedure Laterality Date  . BACK SURGERY     x 3 Dr Cay Schillings, last @ [facility] ~ 2005, rods, fusion, four surgery  . CARDIAC CATHETERIZATION  1994  . CAROTID ENDARTERECTOMY    . CARPAL TUNNEL RELEASE     left  . CHOLECYSTECTOMY    . ENDARTERECTOMY  04/24/2012   Procedure: ENDARTERECTOMY CAROTID;  Surgeon: Rosetta Posner, MD;  Location: [facility];  Service: Vascular;  Laterality: Left;  . HAND SURGERY     bilateral  . HIP FRACTURE SURGERY Left   . HIP PINNING,CANNULATED Left 02/08/2015   Procedure: CANNULATED HIP PINNING;  Surgeon: Mcarthur Rossetti, MD;  [facility];  Service: Orthopedics;  Laterality: Left;  . HIP PINNING,CANNULATED Right 03/31/2016   Procedure: CANNULATED HIP PINNING;  Surgeon: Mcarthur Rossetti, MD;  [facility];  Service: Orthopedics;  Laterality: Right;  . HYSTEROTOMY    . KNEE ARTHROSCOPY Right 02/14/2018   Procedure: RIGHT KNEE ARTHROSCOPY WITH POSSIBLE PARTIAL MENISCECTOMY;  Surgeon: Mcarthur Rossetti, MD;  Location: [place];  Service: Orthopedics;  Laterality: Right;  . MULTIPLE TOOTH EXTRACTIONS    . SPINAL CORD STIMULATOR INSERTION    . TEE WITHOUT CARDIOVERSION  04/12/2012   Procedure: TRANSESOPHAGEAL ECHOCARDIOGRAM (TEE);  Surgeon: Lelon Perla, MD;  [facility];  Service: Cardiovascular;  Laterality: N/A;  . TONSILLECTOMY      MEDICATIONS: . acetaminophen  (TYLENOL) 500 MG tablet  . amLODipine (NORVASC) 10 MG tablet  . aspirin EC 81 MG tablet  . atorvastatin (LIPITOR) 40 MG tablet  . diazepam (VALIUM) 5 MG tablet  . diphenhydrAMINE (BENADRYL) 25 mg capsule  . donepezil (ARICEPT) 10 MG tablet  . DULoxetine (CYMBALTA) 30 MG capsule  . gabapentin (NEURONTIN) 300 MG capsule  . hydrocortisone 2.5 % cream  . HYSINGLA ER 40 MG T24A  . loratadine (CLARITIN) 10 MG tablet  . metoprolol tartrate (LOPRESSOR) 50 MG tablet  . Multiple Vitamin (MULTIVITAMIN WITH MINERALS) TABS tablet  . oxyCODONE-acetaminophen (PERCOCET) 10-325 MG tablet  . pantoprazole (PROTONIX) 40 MG tablet   No current facility-administered medications for this encounter.  Janey Genta [facility] Pre-Surgical Testing [phone] 01/31/19  10:52 AM

## 2019-02-02 ENCOUNTER — Other Ambulatory Visit (HOSPITAL_COMMUNITY)
Admission: RE | Admit: 2019-02-02 | Discharge: 2019-02-02 | Disposition: A | Discharge status: Home / Self Care | Payer: Medicare Other | Source: Ambulatory Visit | Attending: Orthopedic Surgery | Admitting: Orthopedic Surgery

## 2019-02-02 DIAGNOSIS — Z01812 Encounter for preprocedural laboratory examination: Secondary | ICD-10-CM | POA: Diagnosis present

## 2019-02-02 DIAGNOSIS — Z20828 Contact with and (suspected) exposure to other viral communicable diseases: Secondary | ICD-10-CM | POA: Insufficient documentation

## 2019-02-05 LAB — NOVEL CORONAVIRUS, NAA (HOSP ORDER, SEND-OUT TO REF LAB; TAT 18-24 HRS): SARS-CoV-2, NAA: NOT DETECTED

## 2019-02-05 MED ORDER — BUPIVACAINE LIPOSOME 1.3 % IJ SUSP
20.0000 mL | Freq: Once | INTRAMUSCULAR | Status: DC
  Filled 2019-02-05: qty 20

## 2019-02-06 ENCOUNTER — Inpatient Hospital Stay (HOSPITAL_COMMUNITY): Payer: Medicare Other

## 2019-02-06 ENCOUNTER — Other Ambulatory Visit: Payer: Self-pay

## 2019-02-06 ENCOUNTER — Encounter (HOSPITAL_COMMUNITY)
Admission: RE | Disposition: A | Discharge status: Home / Self Care | Payer: Self-pay | Source: Ambulatory Visit | Attending: Orthopedic Surgery

## 2019-02-06 ENCOUNTER — Encounter (HOSPITAL_COMMUNITY): Payer: Self-pay

## 2019-02-06 ENCOUNTER — Inpatient Hospital Stay (HOSPITAL_COMMUNITY)
Admission: RE | Admit: 2019-02-06 | Discharge: 2019-02-09 | DRG: 469 | Disposition: A | Discharge status: Home / Self Care | Payer: Medicare Other | Source: Ambulatory Visit | Attending: Orthopedic Surgery | Admitting: Orthopedic Surgery

## 2019-02-06 ENCOUNTER — Inpatient Hospital Stay (HOSPITAL_COMMUNITY): Payer: Medicare Other | Admitting: Physician Assistant

## 2019-02-06 ENCOUNTER — Inpatient Hospital Stay (HOSPITAL_COMMUNITY): Payer: Medicare Other | Admitting: Certified Registered Nurse Anesthetist

## 2019-02-06 DIAGNOSIS — G894 Chronic pain syndrome: Secondary | ICD-10-CM | POA: Diagnosis present

## 2019-02-06 DIAGNOSIS — E785 Hyperlipidemia, unspecified: Secondary | ICD-10-CM | POA: Diagnosis present

## 2019-02-06 DIAGNOSIS — Z87891 Personal history of nicotine dependence: Secondary | ICD-10-CM

## 2019-02-06 DIAGNOSIS — E7800 Pure hypercholesterolemia, unspecified: Secondary | ICD-10-CM | POA: Diagnosis present

## 2019-02-06 DIAGNOSIS — Z885 Allergy status to narcotic agent status: Secondary | ICD-10-CM

## 2019-02-06 DIAGNOSIS — Z7982 Long term (current) use of aspirin: Secondary | ICD-10-CM | POA: Diagnosis not present

## 2019-02-06 DIAGNOSIS — G8929 Other chronic pain: Secondary | ICD-10-CM

## 2019-02-06 DIAGNOSIS — I2510 Atherosclerotic heart disease of native coronary artery without angina pectoris: Secondary | ICD-10-CM | POA: Diagnosis present

## 2019-02-06 DIAGNOSIS — Z79891 Long term (current) use of opiate analgesic: Secondary | ICD-10-CM

## 2019-02-06 DIAGNOSIS — Z8673 Personal history of transient ischemic attack (TIA), and cerebral infarction without residual deficits: Secondary | ICD-10-CM

## 2019-02-06 DIAGNOSIS — Z880 Allergy status to penicillin: Secondary | ICD-10-CM

## 2019-02-06 DIAGNOSIS — J9601 Acute respiratory failure with hypoxia: Secondary | ICD-10-CM | POA: Diagnosis not present

## 2019-02-06 DIAGNOSIS — I110 Hypertensive heart disease with heart failure: Secondary | ICD-10-CM | POA: Diagnosis present

## 2019-02-06 DIAGNOSIS — Z79899 Other long term (current) drug therapy: Secondary | ICD-10-CM | POA: Diagnosis not present

## 2019-02-06 DIAGNOSIS — I5030 Unspecified diastolic (congestive) heart failure: Secondary | ICD-10-CM | POA: Diagnosis present

## 2019-02-06 DIAGNOSIS — I714 Abdominal aortic aneurysm, without rupture, unspecified: Secondary | ICD-10-CM | POA: Diagnosis present

## 2019-02-06 DIAGNOSIS — R7981 Abnormal blood-gas level: Secondary | ICD-10-CM

## 2019-02-06 DIAGNOSIS — M549 Dorsalgia, unspecified: Secondary | ICD-10-CM | POA: Diagnosis present

## 2019-02-06 DIAGNOSIS — Z91041 Radiographic dye allergy status: Secondary | ICD-10-CM | POA: Diagnosis not present

## 2019-02-06 DIAGNOSIS — M1711 Unilateral primary osteoarthritis, right knee: Secondary | ICD-10-CM | POA: Diagnosis present

## 2019-02-06 DIAGNOSIS — K219 Gastro-esophageal reflux disease without esophagitis: Secondary | ICD-10-CM | POA: Diagnosis present

## 2019-02-06 DIAGNOSIS — I10 Essential (primary) hypertension: Secondary | ICD-10-CM | POA: Diagnosis present

## 2019-02-06 DIAGNOSIS — F0390 Unspecified dementia without behavioral disturbance: Secondary | ICD-10-CM | POA: Diagnosis present

## 2019-02-06 DIAGNOSIS — M1990 Unspecified osteoarthritis, unspecified site: Secondary | ICD-10-CM | POA: Diagnosis present

## 2019-02-06 DIAGNOSIS — M25561 Pain in right knee: Secondary | ICD-10-CM | POA: Diagnosis present

## 2019-02-06 DIAGNOSIS — F419 Anxiety disorder, unspecified: Secondary | ICD-10-CM | POA: Diagnosis present

## 2019-02-06 DIAGNOSIS — M1710 Unilateral primary osteoarthritis, unspecified knee: Secondary | ICD-10-CM | POA: Diagnosis present

## 2019-02-06 DIAGNOSIS — Z96659 Presence of unspecified artificial knee joint: Secondary | ICD-10-CM

## 2019-02-06 HISTORY — PX: TOTAL KNEE ARTHROPLASTY: SHX125

## 2019-02-06 SURGERY — ARTHROPLASTY, KNEE, TOTAL
Anesthesia: General | Site: Knee | Laterality: Right

## 2019-02-06 MED ORDER — HYDROMORPHONE HCL 1 MG/ML IJ SOLN
INTRAMUSCULAR | Status: AC
  Filled 2019-02-06: qty 1

## 2019-02-06 MED ORDER — ACETAMINOPHEN 500 MG PO TABS
1000.0000 mg | ORAL_TABLET | Freq: Once | ORAL | Status: AC
  Administered 2019-02-06: 1000 mg via ORAL
  Filled 2019-02-06: qty 2

## 2019-02-06 MED ORDER — MAGNESIUM CITRATE PO SOLN: 1.0000 | Freq: Once | ORAL | Status: DC | PRN

## 2019-02-06 MED ORDER — ACETAMINOPHEN 10 MG/ML IV SOLN: 1000.0000 mg | Freq: Once | INTRAVENOUS | Status: DC

## 2019-02-06 MED ORDER — ONDANSETRON HCL 4 MG/2ML IJ SOLN
INTRAMUSCULAR | Status: DC | PRN
  Administered 2019-02-06: 4 mg via INTRAVENOUS

## 2019-02-06 MED ORDER — BUPIVACAINE LIPOSOME 1.3 % IJ SUSP
INTRAMUSCULAR | Status: DC | PRN
  Administered 2019-02-06: 20 mL

## 2019-02-06 MED ORDER — METOPROLOL TARTRATE 25 MG PO TABS
25.0000 mg | ORAL_TABLET | Freq: Two times a day (BID) | ORAL | Status: DC
  Administered 2019-02-06 – 2019-02-09 (×6): 25 mg via ORAL
  Filled 2019-02-06 (×6): qty 1

## 2019-02-06 MED ORDER — FENTANYL CITRATE (PF) 100 MCG/2ML IJ SOLN
INTRAMUSCULAR | Status: AC
  Filled 2019-02-06: qty 2

## 2019-02-06 MED ORDER — EPHEDRINE SULFATE-NACL 50-0.9 MG/10ML-% IV SOSY
PREFILLED_SYRINGE | INTRAVENOUS | Status: DC | PRN
  Administered 2019-02-06: 10 mg via INTRAVENOUS
  Administered 2019-02-06: 5 mg via INTRAVENOUS

## 2019-02-06 MED ORDER — ONDANSETRON HCL 4 MG/2ML IJ SOLN
4.0000 mg | Freq: Four times a day (QID) | INTRAMUSCULAR | Status: DC | PRN
  Administered 2019-02-07: 4 mg via INTRAVENOUS
  Filled 2019-02-06: qty 2

## 2019-02-06 MED ORDER — PROPOFOL 10 MG/ML IV BOLUS
INTRAVENOUS | Status: AC
  Filled 2019-02-06: qty 20

## 2019-02-06 MED ORDER — GABAPENTIN 300 MG PO CAPS: 300.0000 mg | ORAL_CAPSULE | ORAL | Status: DC

## 2019-02-06 MED ORDER — ONDANSETRON HCL 4 MG PO TABS: 4.0000 mg | ORAL_TABLET | Freq: Three times a day (TID) | ORAL | 0 refills | Status: DC | PRN

## 2019-02-06 MED ORDER — METOCLOPRAMIDE HCL 5 MG/ML IJ SOLN: 5.0000 mg | Freq: Three times a day (TID) | INTRAMUSCULAR | Status: DC | PRN

## 2019-02-06 MED ORDER — DIPHENHYDRAMINE HCL 12.5 MG/5ML PO ELIX: 12.5000 mg | ORAL_SOLUTION | ORAL | Status: DC | PRN

## 2019-02-06 MED ORDER — GABAPENTIN 300 MG PO CAPS
600.0000 mg | ORAL_CAPSULE | Freq: Every day | ORAL | Status: DC
  Administered 2019-02-06 – 2019-02-08 (×3): 600 mg via ORAL
  Filled 2019-02-06 (×3): qty 2

## 2019-02-06 MED ORDER — LACTATED RINGERS IV SOLN
INTRAVENOUS | Status: DC
  Administered 2019-02-06 – 2019-02-07 (×2): via INTRAVENOUS

## 2019-02-06 MED ORDER — SODIUM CHLORIDE (PF) 0.9 % IJ SOLN
INTRAMUSCULAR | Status: AC
  Filled 2019-02-06: qty 30

## 2019-02-06 MED ORDER — PHENOL 1.4 % MT LIQD
1.0000 | OROMUCOSAL | Status: DC | PRN
  Filled 2019-02-06: qty 177

## 2019-02-06 MED ORDER — SODIUM CHLORIDE 0.9 % IR SOLN
Status: DC | PRN
  Administered 2019-02-06: 1000 mL

## 2019-02-06 MED ORDER — POLYETHYLENE GLYCOL 3350 17 G PO PACK: 17.0000 g | PACK | Freq: Every day | ORAL | Status: DC | PRN

## 2019-02-06 MED ORDER — ASPIRIN EC 81 MG PO TBEC: 81.0000 mg | DELAYED_RELEASE_TABLET | Freq: Two times a day (BID) | ORAL | 0 refills | Status: DC

## 2019-02-06 MED ORDER — ACETAMINOPHEN 500 MG PO TABS
1000.0000 mg | ORAL_TABLET | Freq: Three times a day (TID) | ORAL | Status: AC
  Administered 2019-02-06 – 2019-02-08 (×6): 1000 mg via ORAL
  Filled 2019-02-06 (×6): qty 2

## 2019-02-06 MED ORDER — LIDOCAINE 2% (20 MG/ML) 5 ML SYRINGE
INTRAMUSCULAR | Status: DC | PRN
  Administered 2019-02-06: 60 mg via INTRAVENOUS

## 2019-02-06 MED ORDER — POVIDONE-IODINE 10 % EX SWAB: 2.0000 "application " | Freq: Once | CUTANEOUS | Status: DC

## 2019-02-06 MED ORDER — AMLODIPINE BESYLATE 10 MG PO TABS
10.0000 mg | ORAL_TABLET | Freq: Every day | ORAL | Status: DC
  Administered 2019-02-06 – 2019-02-09 (×4): 10 mg via ORAL
  Filled 2019-02-06 (×4): qty 1

## 2019-02-06 MED ORDER — HYDROMORPHONE HCL 1 MG/ML IJ SOLN
0.5000 mg | INTRAMUSCULAR | Status: DC | PRN
  Administered 2019-02-07: 1 mg via INTRAVENOUS
  Filled 2019-02-06: qty 1

## 2019-02-06 MED ORDER — FENTANYL CITRATE (PF) 100 MCG/2ML IJ SOLN
INTRAMUSCULAR | Status: DC | PRN
  Administered 2019-02-06 (×6): 50 ug via INTRAVENOUS

## 2019-02-06 MED ORDER — HYDROCODONE BITARTRATE ER 40 MG PO T24A
40.0000 mg | EXTENDED_RELEASE_TABLET | Freq: Every day | ORAL | Status: DC
  Administered 2019-02-06 – 2019-02-08 (×3): 40 mg via ORAL

## 2019-02-06 MED ORDER — METHOCARBAMOL 500 MG IVPB - SIMPLE MED
500.0000 mg | Freq: Four times a day (QID) | INTRAVENOUS | Status: DC | PRN
  Administered 2019-02-06: 500 mg via INTRAVENOUS
  Filled 2019-02-06: qty 50

## 2019-02-06 MED ORDER — SODIUM CHLORIDE 0.9% FLUSH
INTRAVENOUS | Status: DC | PRN
  Administered 2019-02-06: 50 mL via INTRAVENOUS

## 2019-02-06 MED ORDER — DEXAMETHASONE SODIUM PHOSPHATE 10 MG/ML IJ SOLN
10.0000 mg | Freq: Once | INTRAMUSCULAR | Status: AC
  Administered 2019-02-07: 10 mg via INTRAVENOUS
  Filled 2019-02-06: qty 1

## 2019-02-06 MED ORDER — MENTHOL 3 MG MT LOZG: 1.0000 | LOZENGE | OROMUCOSAL | Status: DC | PRN

## 2019-02-06 MED ORDER — CEFAZOLIN SODIUM-DEXTROSE 2-4 GM/100ML-% IV SOLN
2.0000 g | INTRAVENOUS | Status: AC
  Administered 2019-02-06: 12:00:00 2 g via INTRAVENOUS
  Filled 2019-02-06: qty 100

## 2019-02-06 MED ORDER — ONDANSETRON HCL 4 MG/2ML IJ SOLN
INTRAMUSCULAR | Status: AC
  Filled 2019-02-06: qty 2

## 2019-02-06 MED ORDER — DIAZEPAM 5 MG PO TABS: 5.0000 mg | ORAL_TABLET | Freq: Every day | ORAL | Status: DC | PRN

## 2019-02-06 MED ORDER — ATORVASTATIN CALCIUM 20 MG PO TABS
20.0000 mg | ORAL_TABLET | Freq: Every day | ORAL | Status: DC
  Administered 2019-02-07 – 2019-02-09 (×3): 20 mg via ORAL
  Filled 2019-02-06 (×3): qty 1

## 2019-02-06 MED ORDER — DULOXETINE HCL 30 MG PO CPEP
30.0000 mg | ORAL_CAPSULE | Freq: Two times a day (BID) | ORAL | Status: DC
  Administered 2019-02-06 – 2019-02-09 (×6): 30 mg via ORAL
  Filled 2019-02-06 (×6): qty 1

## 2019-02-06 MED ORDER — FENTANYL CITRATE (PF) 100 MCG/2ML IJ SOLN
50.0000 ug | Freq: Once | INTRAMUSCULAR | Status: AC
  Administered 2019-02-06: 50 ug via INTRAVENOUS
  Filled 2019-02-06: qty 2

## 2019-02-06 MED ORDER — SORBITOL 70 % SOLN
30.0000 mL | Freq: Every day | Status: DC | PRN
  Filled 2019-02-06: qty 30

## 2019-02-06 MED ORDER — CHLORHEXIDINE GLUCONATE 4 % EX LIQD: 60.0000 mL | Freq: Once | CUTANEOUS | Status: DC

## 2019-02-06 MED ORDER — METHOCARBAMOL 500 MG PO TABS
500.0000 mg | ORAL_TABLET | Freq: Four times a day (QID) | ORAL | Status: DC | PRN
  Administered 2019-02-08: 500 mg via ORAL
  Filled 2019-02-06: qty 1

## 2019-02-06 MED ORDER — OMEPRAZOLE 20 MG PO CPDR: 20.0000 mg | DELAYED_RELEASE_CAPSULE | Freq: Every day | ORAL | 0 refills | Status: DC

## 2019-02-06 MED ORDER — LACTATED RINGERS IV SOLN
INTRAVENOUS | Status: DC
  Administered 2019-02-06: 1000 mL via INTRAVENOUS
  Administered 2019-02-06: 10:00:00 via INTRAVENOUS

## 2019-02-06 MED ORDER — STERILE WATER FOR IRRIGATION IR SOLN
Status: DC | PRN
  Administered 2019-02-06: 2000 mL

## 2019-02-06 MED ORDER — HYDROMORPHONE HCL 1 MG/ML IJ SOLN
0.5000 mg | INTRAMUSCULAR | Status: DC
  Administered 2019-02-06 (×2): 0.5 mg via INTRAVENOUS

## 2019-02-06 MED ORDER — MIDAZOLAM HCL 2 MG/2ML IJ SOLN
1.0000 mg | Freq: Once | INTRAMUSCULAR | Status: DC
  Filled 2019-02-06: qty 2

## 2019-02-06 MED ORDER — CEFAZOLIN SODIUM-DEXTROSE 1-4 GM/50ML-% IV SOLN
1.0000 g | Freq: Four times a day (QID) | INTRAVENOUS | Status: AC
  Administered 2019-02-06 (×2): 1 g via INTRAVENOUS
  Filled 2019-02-06 (×2): qty 50

## 2019-02-06 MED ORDER — PROPOFOL 500 MG/50ML IV EMUL
INTRAVENOUS | Status: AC
  Filled 2019-02-06: qty 50

## 2019-02-06 MED ORDER — 0.9 % SODIUM CHLORIDE (POUR BTL) OPTIME
TOPICAL | Status: DC | PRN
  Administered 2019-02-06: 1000 mL

## 2019-02-06 MED ORDER — BUPIVACAINE-EPINEPHRINE (PF) 0.5% -1:200000 IJ SOLN
INTRAMUSCULAR | Status: DC | PRN
  Administered 2019-02-06: 25 mL via PERINEURAL

## 2019-02-06 MED ORDER — ASPIRIN 81 MG PO CHEW
81.0000 mg | CHEWABLE_TABLET | Freq: Two times a day (BID) | ORAL | Status: DC
  Administered 2019-02-06 – 2019-02-09 (×6): 81 mg via ORAL
  Filled 2019-02-06 (×6): qty 1

## 2019-02-06 MED ORDER — FENTANYL CITRATE (PF) 100 MCG/2ML IJ SOLN
25.0000 ug | INTRAMUSCULAR | Status: DC | PRN
  Administered 2019-02-06 (×3): 50 ug via INTRAVENOUS

## 2019-02-06 MED ORDER — OXYCODONE HCL 5 MG PO TABS
5.0000 mg | ORAL_TABLET | ORAL | Status: DC | PRN
  Administered 2019-02-06: 10 mg via ORAL
  Administered 2019-02-06 – 2019-02-07 (×2): 5 mg via ORAL
  Administered 2019-02-07 (×2): 10 mg via ORAL
  Administered 2019-02-08: 5 mg via ORAL
  Filled 2019-02-06 (×2): qty 2
  Filled 2019-02-06: qty 1
  Filled 2019-02-06 (×3): qty 2

## 2019-02-06 MED ORDER — CELECOXIB 100 MG PO CAPS
100.0000 mg | ORAL_CAPSULE | Freq: Two times a day (BID) | ORAL | Status: DC
  Administered 2019-02-06 – 2019-02-09 (×6): 100 mg via ORAL
  Filled 2019-02-06 (×7): qty 1

## 2019-02-06 MED ORDER — CELECOXIB 100 MG PO CAPS: 100.0000 mg | ORAL_CAPSULE | Freq: Two times a day (BID) | ORAL | 0 refills | Status: DC

## 2019-02-06 MED ORDER — ONDANSETRON HCL 4 MG PO TABS: 4.0000 mg | ORAL_TABLET | Freq: Four times a day (QID) | ORAL | Status: DC | PRN

## 2019-02-06 MED ORDER — OXYCODONE HCL 5 MG PO TABS: 5.0000 mg | ORAL_TABLET | ORAL | 0 refills | Status: DC | PRN

## 2019-02-06 MED ORDER — LORATADINE 10 MG PO TABS: 10.0000 mg | ORAL_TABLET | Freq: Every day | ORAL | Status: DC | PRN

## 2019-02-06 MED ORDER — TRANEXAMIC ACID-NACL 1000-0.7 MG/100ML-% IV SOLN
1000.0000 mg | INTRAVENOUS | Status: AC
  Administered 2019-02-06: 12:00:00 1000 mg via INTRAVENOUS
  Filled 2019-02-06: qty 100

## 2019-02-06 MED ORDER — ACETAMINOPHEN 500 MG PO TABS: 1000.0000 mg | ORAL_TABLET | Freq: Three times a day (TID) | ORAL | 0 refills | Status: DC

## 2019-02-06 MED ORDER — DOCUSATE SODIUM 100 MG PO CAPS
100.0000 mg | ORAL_CAPSULE | Freq: Two times a day (BID) | ORAL | Status: DC
  Administered 2019-02-06 – 2019-02-09 (×6): 100 mg via ORAL
  Filled 2019-02-06 (×6): qty 1

## 2019-02-06 MED ORDER — ACETAMINOPHEN 325 MG PO TABS: 325.0000 mg | ORAL_TABLET | Freq: Four times a day (QID) | ORAL | Status: DC | PRN

## 2019-02-06 MED ORDER — PROPOFOL 10 MG/ML IV BOLUS
INTRAVENOUS | Status: DC | PRN
  Administered 2019-02-06: 110 mg via INTRAVENOUS

## 2019-02-06 MED ORDER — ONDANSETRON HCL 4 MG/2ML IJ SOLN: 4.0000 mg | Freq: Four times a day (QID) | INTRAMUSCULAR | Status: DC | PRN

## 2019-02-06 MED ORDER — LIDOCAINE 2% (20 MG/ML) 5 ML SYRINGE
INTRAMUSCULAR | Status: AC
  Filled 2019-02-06: qty 5

## 2019-02-06 MED ORDER — METOCLOPRAMIDE HCL 5 MG PO TABS: 5.0000 mg | ORAL_TABLET | Freq: Three times a day (TID) | ORAL | Status: DC | PRN

## 2019-02-06 MED ORDER — EPHEDRINE 5 MG/ML INJ
INTRAVENOUS | Status: AC
  Filled 2019-02-06: qty 10

## 2019-02-06 MED ORDER — GABAPENTIN 300 MG PO CAPS
300.0000 mg | ORAL_CAPSULE | Freq: Every day | ORAL | Status: DC
  Administered 2019-02-07 – 2019-02-09 (×3): 300 mg via ORAL
  Filled 2019-02-06 (×3): qty 1

## 2019-02-06 MED ORDER — DONEPEZIL HCL 10 MG PO TABS
10.0000 mg | ORAL_TABLET | Freq: Every day | ORAL | Status: DC
  Administered 2019-02-06 – 2019-02-08 (×3): 10 mg via ORAL
  Filled 2019-02-06 (×3): qty 1

## 2019-02-06 MED ORDER — PANTOPRAZOLE SODIUM 40 MG PO TBEC
40.0000 mg | DELAYED_RELEASE_TABLET | Freq: Every day | ORAL | Status: DC
  Administered 2019-02-07 – 2019-02-09 (×3): 40 mg via ORAL
  Filled 2019-02-06 (×3): qty 1

## 2019-02-06 MED ORDER — METHOCARBAMOL 500 MG IVPB - SIMPLE MED
INTRAVENOUS | Status: AC
  Filled 2019-02-06: qty 50

## 2019-02-06 SURGICAL SUPPLY — 52 items
BASEPLATE TIBIAL UNV SZ3 (Orthopedic Implant) ×3 IMPLANT
BLADE HEX COATED 2.75 (ELECTRODE) ×3 IMPLANT
BLADE SAG 18X100X1.27 (BLADE) ×3 IMPLANT
BLADE SAGITTAL 25.0X1.37X90 (BLADE) ×2 IMPLANT
BLADE SAGITTAL 25.0X1.37X90MM (BLADE) ×1
BLADE SURG 15 STRL LF DISP TIS (BLADE) ×1 IMPLANT
BLADE SURG 15 STRL SS (BLADE) ×2
BLADE SURG SZ10 CARB STEEL (BLADE) ×6 IMPLANT
BNDG ELASTIC 6X10 VLCR STRL LF (GAUZE/BANDAGES/DRESSINGS) ×3 IMPLANT
BNDG ELASTIC 6X15 VLCR STRL LF (GAUZE/BANDAGES/DRESSINGS) ×3 IMPLANT
BOWL SMART MIX CTS (DISPOSABLE) IMPLANT
CEMENT BONE SIMPLEX SPEEDSET (Cement) ×6 IMPLANT
CHLORAPREP W/TINT 26 (MISCELLANEOUS) ×6 IMPLANT
CLOSURE STERI-STRIP 1/2X4 (GAUZE/BANDAGES/DRESSINGS) ×1
CLOSURE WOUND 1/2 X4 (GAUZE/BANDAGES/DRESSINGS) ×1
CLSR STERI-STRIP ANTIMIC 1/2X4 (GAUZE/BANDAGES/DRESSINGS) ×2 IMPLANT
COVER SURGICAL LIGHT HANDLE (MISCELLANEOUS) ×3 IMPLANT
COVER WAND RF STERILE (DRAPES) IMPLANT
CUFF TOURN SGL QUICK 34 (TOURNIQUET CUFF) ×2
CUFF TRNQT CYL 34X4.125X (TOURNIQUET CUFF) ×1 IMPLANT
DECANTER SPIKE VIAL GLASS SM (MISCELLANEOUS) IMPLANT
DRAPE U-SHAPE 47X51 STRL (DRAPES) ×3 IMPLANT
DRSG MEPILEX BORDER 4X12 (GAUZE/BANDAGES/DRESSINGS) ×3 IMPLANT
FEMORAL POST STABILIZED NO 3 (Orthopedic Implant) ×3 IMPLANT
GLOVE BIO SURGEON STRL SZ7.5 (GLOVE) ×6 IMPLANT
GLOVE BIOGEL PI IND STRL 8 (GLOVE) ×2 IMPLANT
GLOVE BIOGEL PI INDICATOR 8 (GLOVE) ×4
GOWN STRL REUS W/ TWL LRG LVL3 (GOWN DISPOSABLE) ×2 IMPLANT
GOWN STRL REUS W/TWL LRG LVL3 (GOWN DISPOSABLE) ×4
HANDPIECE INTERPULSE COAX TIP (DISPOSABLE) ×2
HOLDER FOLEY CATH W/STRAP (MISCELLANEOUS) IMPLANT
IMMOBILIZER KNEE 20 (SOFTGOODS) ×3
IMMOBILIZER KNEE 20 THIGH 36 (SOFTGOODS) ×1 IMPLANT
IMMOBILIZER KNEE 22 UNIV (SOFTGOODS) ×3 IMPLANT
INSERT TIB TRIA X3 9 KNEE (Insert) ×3 IMPLANT
KIT TURNOVER KIT A (KITS) IMPLANT
MANIFOLD NEPTUNE II (INSTRUMENTS) ×3 IMPLANT
NS IRRIG 1000ML POUR BTL (IV SOLUTION) ×3 IMPLANT
PACK TOTAL KNEE CUSTOM (KITS) ×3 IMPLANT
PATELLA TRIATHLON SZ 29 9 MM (Orthopedic Implant) ×3 IMPLANT
PROTECTOR NERVE ULNAR (MISCELLANEOUS) ×3 IMPLANT
SET HNDPC FAN SPRY TIP SCT (DISPOSABLE) ×1 IMPLANT
STRIP CLOSURE SKIN 1/2X4 (GAUZE/BANDAGES/DRESSINGS) ×2 IMPLANT
SUT MNCRL AB 4-0 PS2 18 (SUTURE) ×3 IMPLANT
SUT VIC AB 0 CT1 36 (SUTURE) ×3 IMPLANT
SUT VIC AB 1 CT1 36 (SUTURE) ×6 IMPLANT
SUT VIC AB 2-0 CT1 27 (SUTURE) ×2
SUT VIC AB 2-0 CT1 TAPERPNT 27 (SUTURE) ×1 IMPLANT
TRAY FOLEY MTR SLVR 14FR STAT (SET/KITS/TRAYS/PACK) IMPLANT
TRAY FOLEY MTR SLVR 16FR STAT (SET/KITS/TRAYS/PACK) ×3 IMPLANT
YANKAUER SUCT BULB TIP 10FT TU (MISCELLANEOUS) ×3 IMPLANT
tibial insert ×3 IMPLANT

## 2019-02-06 NOTE — Progress Notes (Signed)
Assisted Dr. Hodierne with right, ultrasound guided, adductor canal block. Side rails up, monitors on throughout procedure. See vital signs in flow sheet. Tolerated Procedure well.  

## 2019-02-06 NOTE — Anesthesia Procedure Notes (Signed)
Procedure Name: LMA Insertion Date/Time: 02/06/2019 12:05 PM Performed by: Maxwell Caul, CRNA Pre-anesthesia Checklist: Patient identified, Emergency Drugs available, Suction available and Patient being monitored Patient Re-evaluated:Patient Re-evaluated prior to induction Oxygen Delivery Method: Circle system utilized Preoxygenation: Pre-oxygenation with 100% oxygen Induction Type: IV induction LMA: LMA inserted LMA Size: 4.0 Number of attempts: 1 Placement Confirmation: positive ETCO2 and breath sounds checked- equal and bilateral Tube secured with: Tape Dental Injury: Teeth and Oropharynx as per pre-operative assessment

## 2019-02-06 NOTE — Interval H&P Note (Signed)
I participated in the care of this patient and agree with the above history, physical and evaluation. I performed a review of the history and a physical exam as detailed   Cattaleya Wien Daniel Quantel Mcinturff MD  

## 2019-02-06 NOTE — Transfer of Care (Signed)
Immediate Anesthesia Transfer of Care Note  Patient: Sara [name]  Procedure(s) Performed: TOTAL KNEE ARTHROPLASTY (Right Knee)  Patient Location: PACU  Anesthesia Type:General  Level of Consciousness: awake, alert  and oriented  Airway & Oxygen Therapy: Patient Spontanous Breathing and Patient connected to face mask oxygen  Post-op Assessment: Report given to RN and Post -op Vital signs reviewed and stable  Post vital signs: Reviewed and stable  Last Vitals:  Vitals Value Taken Time  BP 150/69 02/06/19 1413  Temp    Pulse 86 02/06/19 1415  Resp 12 02/06/19 1415  SpO2 96 % 02/06/19 1415  Vitals shown include unvalidated device data.  Last Pain:  Vitals:   02/06/19 1041  TempSrc:   PainSc: 0-No pain         Complications: No apparent anesthesia complications

## 2019-02-06 NOTE — Evaluation (Signed)
Physical Therapy Evaluation Patient Details Name: Sara [name] MRN: [id number] DOB: 02/21/1941 Today's Date: 02/06/2019   History of Present Illness  Patient is 78 y.o. 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    Clinical Impression  Sara [name] is a 78 y.o. female POD 0 s/p Rt TKA. Patient reports modified independence with RW for mobility at baseline. Patient is now limited by functional impairments (see PT problem list below) and requires min assist for transfers and gait with RW. Patient was able to ambulate ~30 feet with RW and min cues fors safe sue throughout. Pt limited by pain and symptoms of light headedness that subsided with seated rest. Patient instructed in exercise to facilitate ROM and circulation. Patient will benefit from continued skilled PT interventions to address impairments and progress towards PLOF. Acute PT will follow to progress mobility and stair training in preparation for safe discharge home.     Follow Up Recommendations Follow surgeon's recommendation for DC plan and follow-up therapies    Equipment Recommendations  None recommended by PT    Recommendations for Other Services       Precautions / Restrictions Precautions Precautions: Fall Restrictions Weight Bearing Restrictions: No      Mobility  Bed Mobility Overal bed mobility: Needs Assistance Bed Mobility: Supine to Sit     Supine to sit: Min assist;HOB elevated     General bed mobility comments: cues for sequencing and assist for LE mobility  Transfers Overall transfer level: Needs assistance Equipment used: Rolling walker (2 wheeled) Transfers: Sit to/from Stand Sit to Stand: Min assist         General transfer comment: cues for safe hand placement and technique with RW, assist to initiate power up and to steady in standing  Ambulation/Gait Ambulation/Gait assistance: Min assist Gait Distance  (Feet): 30 Feet Assistive device: Rolling walker (2 wheeled) Gait Pattern/deviations: Step-through pattern;Decreased stride length;Decreased stance time - right;Decreased step length - left;Trunk flexed Gait velocity: decreased   General Gait Details: pt required cues for safe hand placement and to maintain safe proximity to RW, pt with poor posture throughout requiring cues to improve  Stairs            Wheelchair Mobility    Modified Rankin (Stroke Patients Only)       Balance Overall balance assessment: Needs assistance Sitting-balance support: Feet supported;No upper extremity supported Sitting balance-Leahy Scale: Fair     Standing balance support: During functional activity;Bilateral upper extremity supported Standing balance-Leahy Scale: Poor              Pertinent Vitals/Pain Pain Assessment: 0-10 Pain Score: 6  Pain Location: Rt knee Pain Descriptors / Indicators: Aching;Sore Pain Intervention(s): Limited activity within patient's tolerance;Monitored during session;Ice applied;Repositioned;Patient requesting pain meds-RN notified    Home Living Family/patient expects to be discharged to:: Private residence Living Arrangements: Spouse/significant other Available Help at Discharge: Family(1 daughter will stay as long as needed since she works from home, and another daughter lives 2-3 minutes away) Type of Home: House Home Access: Stairs to enter Entrance Stairs-Rails: Left Entrance Stairs-Number of Steps: 6 steps with Lt hand rail going up and 3 more at porch with 2 hand rails (can reach both) Home Layout: One level Home Equipment: Walker - 2 wheels;Walker - 4 wheels;Cane - single point;Shower seat;Bedside commode;Toilet riser      Prior Function Level of Independence: Independent with assistive device(s)  Comments: pt was mobilizing with RW prior to surgery     Hand Dominance   Dominant Hand: Right    Extremity/Trunk Assessment    Upper Extremity Assessment Upper Extremity Assessment: Generalized weakness    Lower Extremity Assessment Lower Extremity Assessment: Generalized weakness;RLE deficits/detail RLE Deficits / Details: pt with 3+/5 or greater for Rt quad, no signs of buckling in standing RLE Sensation: WNL RLE Coordination: WNL    Cervical / Trunk Assessment Cervical / Trunk Assessment: Normal  Communication   Communication: No difficulties  Cognition Arousal/Alertness: Awake/alert Behavior During Therapy: WFL for tasks assessed/performed Overall Cognitive Status: Within Functional Limits for tasks assessed                 General Comments      Exercises Total Joint Exercises Ankle Circles/Pumps: AROM;10 reps;Both;Seated Quad Sets: AROM;5 reps;Supine;Right   Assessment/Plan    PT Assessment Patient needs continued PT services  PT Problem List Decreased strength;Decreased balance;Pain;Decreased activity tolerance;Decreased mobility;Decreased range of motion;Decreased knowledge of use of DME       PT Treatment Interventions DME instruction;Functional mobility training;Gait training;Therapeutic activities;Therapeutic exercise;Stair training;Balance training;Patient/family education;Modalities    PT Goals (Current goals can be found in the Care Plan section)  Acute Rehab PT Goals Patient Stated Goal: to return home and improve independence PT Goal Formulation: With patient Time For Goal Achievement: 02/13/19 Potential to Achieve Goals: Good    Frequency 7X/week    AM-PAC PT "6 Clicks" Mobility  Outcome Measure Help needed turning from your back to your side while in a flat bed without using bedrails?: A Little Help needed moving from lying on your back to sitting on the side of a flat bed without using bedrails?: A Little Help needed moving to and from a bed to a chair (including a wheelchair)?: A Little Help needed standing up from a chair using your arms (e.g., wheelchair or  bedside chair)?: A Little Help needed to walk in hospital room?: A Little Help needed climbing 3-5 steps with a railing? : A Lot 6 Click Score: 17    End of Session Equipment Utilized During Treatment: Gait belt Activity Tolerance: Patient tolerated treatment well Patient left: in chair;with call bell/phone within reach;with family/visitor present;with chair alarm set Nurse Communication: Mobility status PT Visit Diagnosis: Pain;Muscle weakness (generalized) (M62.81);Difficulty in walking, not elsewhere classified (R26.2);Other abnormalities of gait and mobility (R26.89) Pain - Right/Left: Right Pain - part of body: Knee    Time: 1194-1740 PT Time Calculation (min) (ACUTE ONLY): 25 min   Charges:   PT Evaluation $PT Eval Low Complexity: 1 Low PT Treatments $Gait Training: 8-22 mins       Kipp Brood, PT, DPT Physical Therapist with [facility]  02/06/2019 7:10 PM

## 2019-02-06 NOTE — Discharge Instructions (Signed)
You may bear weight as tolerated. Keep your dressing on and dry until follow up. Take medicine to prevent blood clots as directed. Resume your chronic pain medications as prescribed.  Oxycodone prescribed is for breakthrough pain only.  Reduce and stop using this medicine as soon as you are able.   INSTRUCTIONS AFTER JOINT REPLACEMENT   o Remove items at home which could result in a fall. This includes throw rugs or furniture in walking pathways o ICE to the affected joint every three hours while awake for 30 minutes at a time, for at least the first 3-5 days, and then as needed for pain and swelling.  Continue to use ice for pain and swelling. You may notice swelling that will progress down to the foot and ankle.  This is normal after surgery.  Elevate your leg when you are not up walking on it.   o Continue to use the breathing machine you got in the hospital (incentive spirometer) which will help keep your temperature down.  It is common for your temperature to cycle up and down following surgery, especially at night when you are not up moving around and exerting yourself.  The breathing machine keeps your lungs expanded and your temperature down.   DIET:  As you were doing prior to hospitalization, we recommend a well-balanced diet.  DRESSING / WOUND CARE / SHOWERING  You may shower 3 days after surgery, but keep the wounds dry during showering.  You may use an occlusive plastic wrap (Press'n Seal for example) with blue painter's tape at edges, NO SOAKING/SUBMERGING IN THE BATHTUB.  If the bandage gets wet, change with a clean dry gauze.  If the incision gets wet, pat the wound dry with a clean towel.  ACTIVITY  o Increase activity slowly as tolerated, but follow the weight bearing instructions below.   o No driving for 6 weeks or until further direction given by your physician.  You cannot drive while taking narcotics.  o No lifting or carrying greater than 10 lbs. until further directed  by your surgeon. o Avoid periods of inactivity such as sitting longer than an hour when not asleep. This helps prevent blood clots.  o You may return to work once you are authorized by your doctor.     WEIGHT BEARING   Weight bearing as tolerated with assist device (walker, cane, etc) as directed, use it as long as suggested by your surgeon or therapist, typically at least 4-6 weeks.   EXERCISES  Results after joint replacement surgery are often greatly improved when you follow the exercise, range of motion and muscle strengthening exercises prescribed by your doctor. Safety measures are also important to protect the joint from further injury. Any time any of these exercises cause you to have increased pain or swelling, decrease what you are doing until you are comfortable again and then slowly increase them. If you have problems or questions, call your caregiver or physical therapist for advice.   Rehabilitation is important following a joint replacement. After just a few days of immobilization, the muscles of the leg can become weakened and shrink (atrophy).  These exercises are designed to build up the tone and strength of the thigh and leg muscles and to improve motion. Often times heat used for twenty to thirty minutes before working out will loosen up your tissues and help with improving the range of motion but do not use heat for the first two weeks following surgery (sometimes heat can increase  post-operative swelling).   These exercises can be done on a training (exercise) mat, on the floor, on a table or on a bed. Use whatever works the best and is most comfortable for you.    Use music or television while you are exercising so that the exercises are a pleasant break in your day. This will make your life better with the exercises acting as a break in your routine that you can look forward to.   Perform all exercises about fifteen times, three times per day or as directed.  You should  exercise both the operative leg and the other leg as well.  Exercises include:    Quad Sets - Tighten up the muscle on the front of the thigh (Quad) and hold for 5-10 seconds.    Straight Leg Raises - With your knee straight (if you were given a brace, keep it on), lift the leg to 60 degrees, hold for 3 seconds, and slowly lower the leg.  Perform this exercise against resistance later as your leg gets stronger.   Leg Slides: Lying on your back, slowly slide your foot toward your buttocks, bending your knee up off the floor (only go as far as is comfortable). Then slowly slide your foot back down until your leg is flat on the floor again.   Angel Wings: Lying on your back spread your legs to the side as far apart as you can without causing discomfort.   Hamstring Strength:  Lying on your back, push your heel against the floor with your leg straight by tightening up the muscles of your buttocks.  Repeat, but this time bend your knee to a comfortable angle, and push your heel against the floor.  You may put a pillow under the heel to make it more comfortable if necessary.   A rehabilitation program following joint replacement surgery can speed recovery and prevent re-injury in the future due to weakened muscles. Contact your doctor or a physical therapist for more information on knee rehabilitation.    CONSTIPATION  Constipation is defined medically as fewer than three stools per week and severe constipation as less than one stool per week.  Even if you have a regular bowel pattern at home, your normal regimen is likely to be disrupted due to multiple reasons following surgery.  Combination of anesthesia, postoperative narcotics, change in appetite and fluid intake all can affect your bowels.   YOU MUST use at least one of the following options; they are listed in order of increasing strength to get the job done.  They are all available over the counter, and you may need to use some, POSSIBLY even  all of these options:    Drink plenty of fluids (prune juice may be helpful) and high fiber foods Colace 100 mg by mouth twice a day  Senokot for constipation as directed and as needed Dulcolax (bisacodyl), take with full glass of water  Miralax (polyethylene glycol) once or twice a day as needed.  If you have tried all these things and are unable to have a bowel movement in the first 3-4 days after surgery call either your surgeon or your primary doctor.    If you experience loose stools or diarrhea, hold the medications until you stool forms back up.  If your symptoms do not get better within 1 week or if they get worse, check with your doctor.  If you experience "the worst abdominal pain ever" or develop nausea or vomiting, please contact the  office immediately for further recommendations for treatment.   ITCHING:  If you experience itching with your medications, try taking only a single pain pill, or even half a pain pill at a time.  You can also use Benadryl over the counter for itching or also to help with sleep.   TED HOSE STOCKINGS:  Use stockings on both legs until for at least 2 weeks or as directed by physician office. They may be removed at night for sleeping.  MEDICATIONS:  See your medication summary on the After Visit Summary that nursing will review with you.  You may have some home medications which will be placed on hold until you complete the course of blood thinner medication.  It is important for you to complete the blood thinner medication as prescribed.  PRECAUTIONS:  If you experience chest pain or shortness of breath - call 911 immediately for transfer to the hospital emergency department.   If you develop a fever greater that 101 F, purulent drainage from wound, increased redness or drainage from wound, foul odor from the wound/dressing, or calf pain - CONTACT YOUR SURGEON.                                                   FOLLOW-UP APPOINTMENTS:  If you do not  already have a post-op appointment, please call the office for an appointment to be seen by your surgeon.  Guidelines for how soon to be seen are listed in your After Visit Summary, but are typically between 1-4 weeks after surgery.  OTHER INSTRUCTIONS:     MAKE SURE YOU:   Understand these instructions.   Get help right away if you are not doing well or get worse.    Thank you for letting us be a part of your medical care team.  It is a privilege we respect greatly.  We hope these instructions will help you stay on track for a fast and full recovery!

## 2019-02-06 NOTE — Anesthesia Procedure Notes (Signed)
Anesthesia Regional Block: Adductor canal block   Pre-Anesthetic Checklist: ,, timeout performed, Correct Patient, Correct Site, Correct Laterality, Correct Procedure, Correct Position, site marked, Risks and benefits discussed,  Surgical consent,  Pre-op evaluation,  At surgeon's request and post-op pain management  Laterality: Right  Prep: chloraprep       Needles:  Injection technique: Single-shot  Needle Type: Echogenic Needle     Needle Length: 9cm  Needle Gauge: 21     Additional Needles:   Narrative:  Start time: 02/06/2019 10:40 AM End time: 02/06/2019 10:46 AM Injection made incrementally with aspirations every 5 mL.  Performed by: Personally  Anesthesiologist: Albertha Ghee, MD  Additional Notes: Pt tolerated the procedure well.

## 2019-02-07 ENCOUNTER — Encounter (HOSPITAL_COMMUNITY): Payer: Self-pay | Admitting: Orthopedic Surgery

## 2019-02-07 ENCOUNTER — Inpatient Hospital Stay (HOSPITAL_COMMUNITY): Payer: Medicare Other

## 2019-02-07 LAB — GLUCOSE, CAPILLARY: Glucose-Capillary: 178 mg/dL — ABNORMAL HIGH (ref 70–99)

## 2019-02-07 NOTE — Progress Notes (Signed)
Physical Therapy Treatment Patient Details Name: Sara [name] MRN: [id number] DOB: 1940/07/29 Today's Date: 02/07/2019    History of Present Illness Patient is 78 y.o. 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    PT Comments    Pt with continued limited ambulation tolerance, ambulating 25 ft with min assist with RW complaining of back and RLE pain. Pt performed stair navigation well, but requires min assist for steadying and verbal cuing. Pt's daughter present and is confident she can assist mother at home. Pt has 100 ft to ambulate to get to stairs, so PT recommending pt uses wheelchair from car to get to 3 steps into townhouse, for energy conservation. PT to see pt for second session.    Follow Up Recommendations  Follow surgeon's recommendation for DC plan and follow-up therapies     Equipment Recommendations  None recommended by PT    Recommendations for Other Services       Precautions / Restrictions Precautions Precautions: Fall Restrictions Weight Bearing Restrictions: No    Mobility  Bed Mobility Overal bed mobility: Needs Assistance Bed Mobility: Sit to Supine       Sit to supine: HOB elevated;Min assist   General bed mobility comments: pt sitting EOB with RN upon arrival to room. Min assist for LE lifting into bed, re-positioning.  Transfers Overall transfer level: Needs assistance Equipment used: Rolling walker (2 wheeled) Transfers: Sit to/from Stand Sit to Stand: Min assist         General transfer comment: Min assist for power up, steadying, increased time to bring UEs to RW after initial power up. Sit to stand x3, from bed, BSC, recliner.  Ambulation/Gait Ambulation/Gait assistance: Min assist Gait Distance (Feet): 25 Feet Assistive device: Rolling walker (2 wheeled) Gait Pattern/deviations: Step-through pattern;Decreased stride length;Decreased stance time -  right;Decreased step length - left;Trunk flexed Gait velocity: decreased   General Gait Details: min guard to min assist for steadying, frequent reinforcement for upright posture, placement in RW.   Stairs Stairs: Yes Stairs assistance: Min assist Stair Management: One rail Right;With cane;Step to pattern;Forwards Number of Stairs: 3 General stair comments: min assist for steadying, supporting pt as pt with posterior leaning on step transitions (corrected with verbal and tactile cuing). Verbal cuing for sequencing (up with good leg leading, down with bad leg leading), placement of cane for support.   Wheelchair Mobility    Modified Rankin (Stroke Patients Only)       Balance Overall balance assessment: Needs assistance Sitting-balance support: Feet supported;No upper extremity supported Sitting balance-Leahy Scale: Fair     Standing balance support: During functional activity;Bilateral upper extremity supported Standing balance-Leahy Scale: Poor Standing balance comment: reliant on external support                            Cognition Arousal/Alertness: Awake/alert Behavior During Therapy: WFL for tasks assessed/performed Overall Cognitive Status: Within Functional Limits for tasks assessed                                        Exercises      General Comments        Pertinent Vitals/Pain Pain Assessment: 0-10 Pain Score: 5  Pain Location: R knee, back Pain Descriptors / Indicators: Sore Pain Intervention(s): Monitored during  session;Limited activity within patient's tolerance;Premedicated before session;Repositioned;Patient requesting pain meds-RN notified    Home Living                      Prior Function            PT Goals (current goals can now be found in the care plan section) Acute Rehab PT Goals Patient Stated Goal: to return home and improve independence PT Goal Formulation: With patient Time For Goal  Achievement: 02/13/19 Potential to Achieve Goals: Good Progress towards PT goals: Progressing toward goals    Frequency    7X/week      PT Plan Current plan remains appropriate    Co-evaluation              AM-PAC PT "6 Clicks" Mobility   Outcome Measure  Help needed turning from your back to your side while in a flat bed without using bedrails?: A Little Help needed moving from lying on your back to sitting on the side of a flat bed without using bedrails?: A Little Help needed moving to and from a bed to a chair (including a wheelchair)?: A Little Help needed standing up from a chair using your arms (e.g., wheelchair or bedside chair)?: A Little Help needed to walk in hospital room?: A Little Help needed climbing 3-5 steps with a railing? : A Little 6 Click Score: 18    End of Session Equipment Utilized During Treatment: Gait belt Activity Tolerance: Patient tolerated treatment well Patient left: with call bell/phone within reach;with family/visitor present;in bed Nurse Communication: Mobility status PT Visit Diagnosis: Pain;Muscle weakness (generalized) (M62.81);Difficulty in walking, not elsewhere classified (R26.2);Other abnormalities of gait and mobility (R26.89) Pain - Right/Left: Right Pain - part of body: Knee     Time: 7741-2878 PT Time Calculation (min) (ACUTE ONLY): 32 min  Charges:  $Gait Training: 23-37 mins                     Nicola Police, PT Acute Rehabilitation Services Pager [phone]  Office [phone]    Sara [name] 02/07/2019, 10:59 AM

## 2019-02-07 NOTE — Progress Notes (Signed)
Subjective: Patient reports pain as mild to moderate, controlled.  Tolerating diet.  Urinating.  No CP, SOB.  Good early mobilization.  Objective:   VITALS:   Vitals:   02/06/19 1937 02/06/19 2106 02/07/19 0119 02/07/19 0450  BP: 126/75 (!) 153/67 134/83 (!) 149/72  Pulse: 78 72 66 66  Resp: 14 14 14 14   Temp: 98 F (36.7 C) (!) 97.2 F (36.2 C) 97.9 F (36.6 C) 98.2 F (36.8 C)  TempSrc: Oral Oral Oral Oral  SpO2: 98% 100% 96% 94%  Weight:      Height:       CBC Latest Ref Rng & Units 01/29/2019 01/19/2019 06/06/2018  WBC 4.0 - 10.5 K/uL 8.6 7.1 9.4  Hemoglobin 12.0 - 15.0 g/dL 13.0 12.4 12.6  Hematocrit 36.0 - 46.0 % 42.0 37.8 40.4  Platelets 150 - 400 K/uL 287 295 307   BMP Latest Ref Rng & Units 01/29/2019 01/19/2019 06/06/2018  Glucose 70 - 99 mg/dL 116(H) 140(H) 131(H)  BUN 8 - 23 mg/dL 21 20 29(H)  Creatinine 0.44 - 1.00 mg/dL 1.16(H) 1.14(H) 1.03(H)  BUN/Creat Ratio 6 - 22 (calc) - 18 -  Sodium 135 - 145 mmol/L 141 141 142  Potassium 3.5 - 5.1 mmol/L 3.8 4.1 3.8  Chloride 98 - 111 mmol/L 104 104 105  CO2 22 - 32 mmol/L 27 26 29   Calcium 8.9 - 10.3 mg/dL 9.0 8.4(L) 9.0   Intake/Output      10/13 0701 - 10/14 0700 10/14 0701 - 10/15 0700   P.O. 240    I.V. (mL/kg) 2317.4 (35.3)    IV Piggyback 348.3    Total Intake(mL/kg) 2905.8 (44.2)    Urine (mL/kg/hr) 200    Stool 0    Blood 5    Total Output 205    Net +2700.8         Urine Occurrence 1 x    Stool Occurrence 0 x       Physical Exam: General: NAD.  Supine in bed.  Conversant.  Daughter at bedside. Resp: No increased wob Cardio: regular rate and rhythm ABD soft Neurologically intact MSK RLE: Neurovascularly intact Sensation intact distally Feet warm Dorsiflexion/Plantar flexion intact Incision: dressing C/D/I   Assessment: 1 Day Post-Op  S/P Procedure(s) (LRB): TOTAL KNEE ARTHROPLASTY (Right) by Dr. Ernesta Amble. Percell Miller on 02/06/2019  Principal Problem:   Primary osteoarthritis of  right knee Active Problems:   Hypertension   Hypercholesteremia   Chronic back pain   AAA (abdominal aortic aneurysm) without rupture (HCC)   History of CVA (cerebrovascular accident)   GERD (gastroesophageal reflux disease)   Primary localized osteoarthritis of knee   Primary osteoarthritis, status post right total knee arthroplasty Well postop day 1 Tolerating diet and voiding Pain controlled Good early mobilization  Plan: Advance diet Up with therapy D/C IV fluids Incentive Spirometry Elevate and Apply ice CPM, bone foam  Weight Bearing: Weight Bearing as Tolerated (WBAT) RLE Dressings: Maintain Mepilex.   VTE prophylaxis: Aspirin, SCDs, ambulation Dispo: Home today therapy evaluation  Anticipated LOS less than 2 midnights.  Insurance approval for inpatient due to: - Age 80 and older with one or more of the following:             - Expected need for hospital services (PT, OT, Nursing) required for safe   discharge             - Active co-morbidities: Chronic pain requiring opiods, Coronary Artery Disease and Stroke  Lucretia Kern Martensen III, PA-C 02/07/2019, 8:24 AM

## 2019-02-07 NOTE — Op Note (Signed)
DATE OF SURGERY:  02/06/2019 TIME: 7:38 AM  PATIENT NAME:  Sara [name]   AGE: 78 y.o.    PRE-OPERATIVE DIAGNOSIS:  OA RIGHT KNEE  POST-OPERATIVE DIAGNOSIS:  Same  PROCEDURE:  Procedure(s): TOTAL KNEE ARTHROPLASTY   SURGEON:  Renette Butters, MD   ASSISTANT:  Roxan Hockey, PA-C, he was present and scrubbed throughout the case, critical for completion in a timely fashion, and for retraction, instrumentation, and closure.   OPERATIVE IMPLANTS: [employer] Triathlon Posterior Stabilized. Universal base plate with a TS poly given her pre-op ant instability  Femur size 3, Tibia size 3, Patella size 29 3-peg oval button, with a 9 mm polyethylene insert.   PREOPERATIVE INDICATIONS:  Sara [name] is a 78 y.o. year old female with end stage bone on bone degenerative arthritis of the knee who failed conservative treatment, including injections, antiinflammatories, activity modification, and assistive devices, and had significant impairment of their activities of daily living, and elected for Total Knee Arthroplasty.   The risks, benefits, and alternatives were discussed at length including but not limited to the risks of infection, bleeding, nerve injury, stiffness, blood clots, the need for revision surgery, cardiopulmonary complications, among others, and they were willing to proceed.   OPERATIVE DESCRIPTION:  The patient was brought to the operative room and placed in a supine position.  General anesthesia was administered.  IV antibiotics were given.  The lower extremity was prepped and draped in the usual sterile fashion.  Time out was performed.  The leg was elevated and exsanguinated and the tourniquet was inflated.  Anterior approach was performed.  The patella was everted and osteophytes were removed.  The anterior horn of the medial and lateral meniscus was removed.   The distal femur was opened with the drill and the intramedullary distal femoral cutting jig was utilized,  set at 5 degrees resecting 8 mm off the distal femur.  Care was taken to protect the collateral ligaments.  The distal femoral sizing jig was applied, taking care to avoid notching.  Then the 4-in-1 cutting jig was applied and the anterior and posterior femur was cut, along with the chamfer cuts.  All posterior osteophytes were removed.  The flexion gap was then measured and was symmetric with the extension gap.  Then the extramedullary tibial cutting jig was utilized making the appropriate cut using the anterior tibial crest as a reference building in appropriate posterior slope.  Care was taken during the cut to protect the medial and collateral ligaments.  The proximal tibia was removed along with the posterior horns of the menisci.  The PCL was sacrificed.    The extensor gap was measured and was approximately 52mm.    I completed the distal femoral preparation using the appropriate jig to prepare the box.  The patella was then measured, and cut with the saw.    The proximal tibia sized and prepared accordingly with the reamer and the punch, and then all components were trialed with the 57mm poly insert.  The knee was found to have excellent balance and full motion.    The above named components were then cemented into place and all excess cement was removed. Poly tibial piece and patella were inserted.  I was very happy with his stability and ROM  I performed a periarticular injection with marcaine and toradol  The knee was easily taken through a range of motion and the patella tracked well and the knee irrigated copiously and the parapatellar and subcutaneous tissue  closed with vicryl, and monocryl with steri strips for the skin.  The incision was dressed with sterile gauze and the tourniquet released and the patient was awakened and returned to the PACU in stable and satisfactory condition.  There were no complications.  Total tourniquet time was roughly 70 minutes.   POSTOPERATIVE  PLAN: post op Abx, DVT px: SCD's, TED's, Early ambulation and chemical px

## 2019-02-07 NOTE — Progress Notes (Signed)
Patient O2 sat is between 87-88 on room air. Patient placed back on 2L. HC, PA notified. Encouraged patient to use incentive spirometer. Will continue to monitor.

## 2019-02-07 NOTE — Anesthesia Postprocedure Evaluation (Signed)
Anesthesia Post Note  Patient: Sara [name]  Procedure(s) Performed: TOTAL KNEE ARTHROPLASTY (Right Knee)     Patient location during evaluation: PACU Anesthesia Type: General Level of consciousness: awake and alert Pain management: pain level controlled Vital Signs Assessment: post-procedure vital signs reviewed and stable Respiratory status: spontaneous breathing, nonlabored ventilation, respiratory function stable and patient connected to nasal cannula oxygen Cardiovascular status: blood pressure returned to baseline and stable Postop Assessment: no apparent nausea or vomiting Anesthetic complications: no    Last Vitals:  Vitals:   02/07/19 1044 02/07/19 1121  BP: (!) 141/61   Pulse: 65   Resp: 14   Temp: 36.7 C   SpO2: 98% 96%    Last Pain:  Vitals:   02/07/19 1044  TempSrc: Oral  PainSc:                  [place]

## 2019-02-07 NOTE — Progress Notes (Addendum)
Physical Therapy Treatment Patient Details Name: Sara [name] MRN: [id number] DOB: Apr 01, 1941 Today's Date: 02/07/2019    History of Present Illness Patient is 78 y.o. 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    PT Comments    Pt progressed ambulation distance, but is quite unsteady and is a significant fall risk. Pt with LOB needing to be corrected by PT. Pt performed some TKR exercises, limited by fatigue. Handout administered and reviewed.   PT recommended PTAR home for pt safety if pt is indeed leaving today, as pt is very fatigued and is a high fall risk with current mobility. Pt and daughter agree, but may not d/c due to O2 requirements (see below) and mobility.   SATURATION QUALIFICATIONS: (This note is used to comply with regulatory documentation for home oxygen)  Patient Saturations on Room Air at Rest = 93%  Patient Saturations on Room Air while Ambulating = 82%   Patient Saturations on 2 Liters of oxygen while Ambulating = 93%  Please briefly explain why patient needs home oxygen: maintain sats >90%   Follow Up Recommendations  Follow surgeon's recommendation for DC plan and follow-up therapies     Equipment Recommendations  None recommended by PT    Recommendations for Other Services       Precautions / Restrictions Precautions Precautions: Fall Restrictions Weight Bearing Restrictions: No    Mobility  Bed Mobility Overal bed mobility: Needs Assistance Bed Mobility: Supine to Sit;Sit to Supine     Supine to sit: Min assist;HOB elevated Sit to supine: HOB elevated;Min assist   General bed mobility comments: min assist for supine<>sit for LE and trunk management  Transfers Overall transfer level: Needs assistance Equipment used: Rolling walker (2 wheeled) Transfers: Sit to/from Stand Sit to Stand: Min assist         General transfer comment: Min assist for power up,  steadying, increased time to bring UEs to RW after initial power up.  Ambulation/Gait Ambulation/Gait assistance: Min assist Gait Distance (Feet): 50 Feet Assistive device: Rolling walker (2 wheeled) Gait Pattern/deviations: Step-through pattern;Decreased stride length;Decreased stance time - right;Decreased step length - left;Trunk flexed;Shuffle Gait velocity: decreased   General Gait Details: min guard for first 25 ft, min assist for last 25 ft due to unsteadiness, LOB x1. Frequent VC for placement in RW, picking up feet, upright posture.   Stairs Stairs: Yes Stairs assistance: Min assist Stair Management: One rail Right;With cane;Step to pattern;Forwards Number of Stairs: 3 General stair comments: min assist for steadying, supporting pt as pt with posterior leaning on step transitions (corrected with verbal and tactile cuing). Verbal cuing for sequencing (up with good leg leading, down with bad leg leading), placement of cane for support.   Wheelchair Mobility    Modified Rankin (Stroke Patients Only)       Balance Overall balance assessment: Needs assistance Sitting-balance support: Feet supported;No upper extremity supported Sitting balance-Leahy Scale: Fair     Standing balance support: During functional activity;Bilateral upper extremity supported Standing balance-Leahy Scale: Poor Standing balance comment: reliant on external support, LOBx1                            Cognition Arousal/Alertness: Awake/alert Behavior During Therapy: WFL for tasks assessed/performed Overall Cognitive Status: Within Functional Limits for tasks assessed  General Comments: pt drowsy and fatigued      Exercises Total Joint Exercises Ankle Circles/Pumps: AROM;10 reps;Both;Seated Quad Sets: AROM;5 reps;Supine;Both;10 reps Heel Slides: AAROM;Right;5 reps;Supine Hip ABduction/ADduction: AAROM;Right;5 reps;Supine Straight Leg  Raises: AAROM;Right;5 reps;Supine    General Comments        Pertinent Vitals/Pain Pain Assessment: Faces Pain Score: 5  Faces Pain Scale: Hurts a little bit Pain Location: R knee, back Pain Descriptors / Indicators: Sore Pain Intervention(s): Limited activity within patient's tolerance;Monitored during session;Premedicated before session;Repositioned    Home Living                      Prior Function            PT Goals (current goals can now be found in the care plan section) Acute Rehab PT Goals Patient Stated Goal: to return home and improve independence PT Goal Formulation: With patient Time For Goal Achievement: 02/13/19 Potential to Achieve Goals: Good Progress towards PT goals: Progressing toward goals    Frequency    7X/week      PT Plan Current plan remains appropriate    Co-evaluation              AM-PAC PT "6 Clicks" Mobility   Outcome Measure  Help needed turning from your back to your side while in a flat bed without using bedrails?: A Little Help needed moving from lying on your back to sitting on the side of a flat bed without using bedrails?: A Little Help needed moving to and from a bed to a chair (including a wheelchair)?: A Little Help needed standing up from a chair using your arms (e.g., wheelchair or bedside chair)?: A Little Help needed to walk in hospital room?: A Little Help needed climbing 3-5 steps with a railing? : A Little 6 Click Score: 18    End of Session Equipment Utilized During Treatment: Gait belt Activity Tolerance: Patient tolerated treatment well Patient left: with call bell/phone within reach;with family/visitor present;in bed Nurse Communication: Mobility status PT Visit Diagnosis: Pain;Muscle weakness (generalized) (M62.81);Difficulty in walking, not elsewhere classified (R26.2);Other abnormalities of gait and mobility (R26.89) Pain - Right/Left: Right Pain - part of body: Knee     Time:  1135-1205 PT Time Calculation (min) (ACUTE ONLY): 30 min  Charges:  $Gait Training: 8-22 mins $Therapeutic Exercise: 8-22 mins                     Nicola Police, PT Acute Rehabilitation Services Pager [phone]  Office [phone]   [name] D [name] 02/07/2019, 1:07 PM

## 2019-02-08 ENCOUNTER — Inpatient Hospital Stay (HOSPITAL_COMMUNITY): Payer: Medicare Other

## 2019-02-08 DIAGNOSIS — J9601 Acute respiratory failure with hypoxia: Secondary | ICD-10-CM

## 2019-02-08 LAB — BLOOD GAS, ARTERIAL
Acid-Base Excess: 2.3 mmol/L — ABNORMAL HIGH (ref 0.0–2.0)
Bicarbonate: 26.4 mmol/L (ref 20.0–28.0)
Drawn by: 270211
O2 Saturation: 95.1 %
Patient temperature: 98.6
pCO2 arterial: 41.4 mmHg (ref 32.0–48.0)
pH, Arterial: 7.421 (ref 7.350–7.450)
pO2, Arterial: 70 mmHg — ABNORMAL LOW (ref 83.0–108.0)

## 2019-02-08 LAB — BASIC METABOLIC PANEL
Anion gap: 9 (ref 5–15)
BUN: 27 mg/dL — ABNORMAL HIGH (ref 8–23)
CO2: 25 mmol/L (ref 22–32)
Calcium: 8.2 mg/dL — ABNORMAL LOW (ref 8.9–10.3)
Chloride: 103 mmol/L (ref 98–111)
Creatinine, Ser: 1.24 mg/dL — ABNORMAL HIGH (ref 0.44–1.00)
GFR calc Af Amer: 48 mL/min — ABNORMAL LOW (ref >60)
GFR calc non Af Amer: 41 mL/min — ABNORMAL LOW (ref >60)
Glucose, Bld: 123 mg/dL — ABNORMAL HIGH (ref 70–99)
Potassium: 4.2 mmol/L (ref 3.5–5.1)
Sodium: 137 mmol/L (ref 135–145)

## 2019-02-08 LAB — CBC WITH DIFFERENTIAL/PLATELET
Abs Immature Granulocytes: 0.09 10*3/uL — ABNORMAL HIGH (ref 0.00–0.07)
Basophils Absolute: 0 10*3/uL (ref 0.0–0.1)
Basophils Relative: 0 %
Eosinophils Absolute: 0 10*3/uL (ref 0.0–0.5)
Eosinophils Relative: 0 %
HCT: 31.5 % — ABNORMAL LOW (ref 36.0–46.0)
Hemoglobin: 9.9 g/dL — ABNORMAL LOW (ref 12.0–15.0)
Immature Granulocytes: 1 %
Lymphocytes Relative: 11 %
Lymphs Abs: 1.4 10*3/uL (ref 0.7–4.0)
MCH: 31.1 pg (ref 26.0–34.0)
MCHC: 31.4 g/dL (ref 30.0–36.0)
MCV: 99.1 fL (ref 80.0–100.0)
Monocytes Absolute: 1 10*3/uL (ref 0.1–1.0)
Monocytes Relative: 8 %
Neutro Abs: 10.4 10*3/uL — ABNORMAL HIGH (ref 1.7–7.7)
Neutrophils Relative %: 80 %
Platelets: 208 10*3/uL (ref 150–400)
RBC: 3.18 MIL/uL — ABNORMAL LOW (ref 3.87–5.11)
RDW: 13.2 % (ref 11.5–15.5)
WBC: 12.9 10*3/uL — ABNORMAL HIGH (ref 4.0–10.5)
nRBC: 0 % (ref 0.0–0.2)

## 2019-02-08 LAB — D-DIMER, QUANTITATIVE: D-Dimer, Quant: 0.89 ug/mL-FEU — ABNORMAL HIGH (ref 0.00–0.50)

## 2019-02-08 MED ORDER — HYDROMORPHONE HCL 1 MG/ML IJ SOLN: 0.2500 mg | INTRAMUSCULAR | Status: DC | PRN

## 2019-02-08 MED ORDER — TECHNETIUM TO 99M ALBUMIN AGGREGATED
1.5000 | Freq: Once | INTRAVENOUS | Status: AC | PRN
  Administered 2019-02-08: 1.5 via INTRAVENOUS

## 2019-02-08 MED ORDER — OXYCODONE HCL 5 MG PO TABS
5.0000 mg | ORAL_TABLET | ORAL | Status: DC | PRN
  Administered 2019-02-08 – 2019-02-09 (×7): 5 mg via ORAL
  Filled 2019-02-08 (×8): qty 1

## 2019-02-08 MED ORDER — BACLOFEN 10 MG PO TABS: 10.0000 mg | ORAL_TABLET | Freq: Two times a day (BID) | ORAL | Status: DC | PRN

## 2019-02-08 NOTE — Progress Notes (Signed)
Sara [name] paged regarding the pt's need for home oxygen DME for d/c.

## 2019-02-08 NOTE — Progress Notes (Signed)
SATURATION QUALIFICATIONS: (This note is used to comply with regulatory documentation for home oxygen)  Patient Saturations on Room Air at Rest = 87%  Patient Saturations on Room Air while Ambulating = 85%  Patient Saturations on 1 Liters of oxygen while Ambulating = 93%

## 2019-02-08 NOTE — Progress Notes (Signed)
Physical Therapy Treatment Patient Details Name: Sara [name] MRN: [id number] DOB: 04-24-1941 Today's Date: 02/08/2019    History of Present Illness Patient is 78 y.o. 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    PT Comments    Pt still requiring min assist for mobility, but caregiver training today reveals pt's daughter is able to physically assist as needed. PT practiced stair navigation with pt to reinforce safety with steps upon d/c. Plan is for pt to use w/c from car to base of steps, climb 3 steps, and immediately rest upon entering home. Both pt and daughter feel comfortable for d/c home. Pt safe to d/c home from PT standpoint.   SATURATION QUALIFICATIONS: (This note is used to comply with regulatory documentation for home oxygen)  Patient Saturations on Room Air at Rest = 93%  Patient Saturations on Room Air while Ambulating = 84%  Patient Saturations on 1 Liters of oxygen while Ambulating = 93%  Please briefly explain why patient needs home oxygen: to maintain O2 saturation >90%   Follow Up Recommendations  Follow surgeon's recommendation for DC plan and follow-up therapies     Equipment Recommendations  None recommended by PT    Recommendations for Other Services       Precautions / Restrictions Precautions Precautions: Fall Restrictions Weight Bearing Restrictions: No    Mobility  Bed Mobility Overal bed mobility: Needs Assistance Bed Mobility: Supine to Sit;Sit to Supine     Supine to sit: Min assist     General bed mobility comments: min assist for RLE lifting, trunk elevation, scooting to EOB.  Transfers Overall transfer level: Needs assistance Equipment used: Rolling walker (2 wheeled) Transfers: Sit to/from Stand Sit to Stand: Min assist         General transfer comment: Min assist for power up, steadying. Sit to stand x2, once from bed and twice from recliner  pre- and post-stair training. Pt's daughter assisting for second stand attempt, assist performed well.  Ambulation/Gait Ambulation/Gait assistance: Min assist;Min guard Gait Distance (Feet): 25 Feet(25 ft +5 ft + 5 ft) Assistive device: Rolling walker (2 wheeled) Gait Pattern/deviations: Step-through pattern;Decreased stride length;Decreased stance time - right;Trunk flexed;Shuffle Gait velocity: decreased   General Gait Details: min guard to min assist for steadying, pt limited by fatigue and short step length. PT reinforcing pt picking up feet and taking larger steps.   Stairs   Stairs assistance: Min assist Stair Management: One rail Right;With cane;Step to pattern;Forwards Number of Stairs: 3 General stair comments: Min assist for steadying, verbal cuing for sequencing. Pt's daughter providing VC for sequencing, use of cane with steps.   Wheelchair Mobility    Modified Rankin (Stroke Patients Only)       Balance Overall balance assessment: Needs assistance Sitting-balance support: Feet supported;No upper extremity supported Sitting balance-Leahy Scale: Fair     Standing balance support: During functional activity;Bilateral upper extremity supported Standing balance-Leahy Scale: Poor Standing balance comment: reliant on external support, LOBx1                            Cognition Arousal/Alertness: Awake/alert Behavior During Therapy: WFL for tasks assessed/performed Overall Cognitive Status: Within Functional Limits for tasks assessed  General Comments: increased processing time, drowsy upon PT arrival to room but more awake and alert with mobility.      Exercises Total Joint Exercises Ankle Circles/Pumps: AROM;10 reps;Both;Seated Quad Sets: AROM;5 reps;Both;Seated Heel Slides: AAROM;Right;5 reps;Supine    General Comments General comments (skin integrity, edema, etc.): SpO2 84% on RA with ambulation,  placed on 1L for recovery to 93% during activity.      Pertinent Vitals/Pain Pain Assessment: Faces Faces Pain Scale: Hurts a little bit Pain Location: R knee Pain Descriptors / Indicators: Sore;Discomfort;Grimacing Pain Intervention(s): Limited activity within patient's tolerance;Monitored during session;Premedicated before session;Repositioned    Home Living                      Prior Function            PT Goals (current goals can now be found in the care plan section) Acute Rehab PT Goals Patient Stated Goal: to return home and improve independence PT Goal Formulation: With patient Time For Goal Achievement: 02/13/19 Potential to Achieve Goals: Good Progress towards PT goals: Progressing toward goals    Frequency    7X/week      PT Plan Current plan remains appropriate    Co-evaluation              AM-PAC PT "6 Clicks" Mobility   Outcome Measure  Help needed turning from your back to your side while in a flat bed without using bedrails?: A Little Help needed moving from lying on your back to sitting on the side of a flat bed without using bedrails?: A Little Help needed moving to and from a bed to a chair (including a wheelchair)?: A Little Help needed standing up from a chair using your arms (e.g., wheelchair or bedside chair)?: A Little Help needed to walk in hospital room?: A Little Help needed climbing 3-5 steps with a railing? : A Little 6 Click Score: 18    End of Session Equipment Utilized During Treatment: Gait belt;Oxygen Activity Tolerance: Patient tolerated treatment well Patient left: with call bell/phone within reach;with family/visitor present;in bed Nurse Communication: Mobility status PT Visit Diagnosis: Pain;Muscle weakness (generalized) (M62.81);Difficulty in walking, not elsewhere classified (R26.2);Other abnormalities of gait and mobility (R26.89) Pain - Right/Left: Right Pain - part of body: Knee     Time: 1000-1026 PT  Time Calculation (min) (ACUTE ONLY): 26 min  Charges:  $Gait Training: 23-37 mins                     Julien Girt, PT Acute Rehabilitation Services Pager [phone]  Office [phone]    Dianne Bady D Adriauna Campton 02/08/2019, 11:09 AM

## 2019-02-08 NOTE — Progress Notes (Signed)
Prudencio Burly III, PA was paged/notified regarding the pt's pending medical clearance and inability to obtain home oxygen d/t the time of day. Home O2 will have to be set up in the morning. Pt will be staying another night.

## 2019-02-08 NOTE — Consult Note (Signed)
Medical Consultation   Sara [name]  FWY:[id number]  DOB: 02/19/1941  DOA: 02/06/2019  PCP: Wanda Plump, MD   Requesting physician: Dr Margarita Rana  Reason for consultation: Hypoxia   History of Present Illness: Sara [name] is an 78 y.o. female with a history of CVA, AAA, chronic back pain, hypertension, hyperlipidemia, GERD, was admitted by the orthopedics team for total right knee arthroplasty done on 02/06/2019.  Patient was noted to subsequently hypoxic while participating with physical therapy, sats dropped to 82% on ambulation.  Orthopedics team/family unsure of patient's respiratory status prior to surgery.  Patient does not use oxygen at home/has never required oxygen.  Daughter at bedside, had an extensive discussion with her, who noted patient was a longtime smoker currently quit.  As of today, patient was planned to be discharged, but still remained hypoxic on ambulation and sometimes while sleeping.  Patient also noted to be dyspneic on exertion.  Patient denies any cough, chest pain, congestion, nausea/vomiting, diarrhea, abdominal pain, fever/chills.  [facility] hospitalist asked to consult on the patient, prior to discharge  Review of Systems:  Review of systems are otherwise negative.    Past Medical History: Past Medical History:  Diagnosis Date  . Anxiety   . Arthritis   . Bronchitis    hx of  . Carotid artery occlusion   . Chronic pain syndrome    pain management  . Coronary artery disease    sees Dr. Patty Sermons  . Dementia (HCC)   . GERD (gastroesophageal reflux disease)    hx of  . H/O hiatal hernia   . Hypercholesteremia   . Hypertension    all managed by Dr. Haynes Dage bill  . Mitral valve regurgitation    trace  . Non-traumatic compression fracture of thoracic vertebra (HCC)    T9  . Pneumonia 05/27/2017  . S/P insertion of spinal cord stimulator    "i just charge it every couple of weeks , i dont have a remote "   . Stroke (HCC)    . Wears dentures   . Wears glasses     Past Surgical History: Past Surgical History:  Procedure Laterality Date  . BACK SURGERY     x 3 Dr Juliene Pina, last @ [facility] ~ 2005, rods, fusion, four surgery  . CARDIAC CATHETERIZATION  1994  . CAROTID ENDARTERECTOMY    . CARPAL TUNNEL RELEASE     left  . CHOLECYSTECTOMY    . ENDARTERECTOMY  04/24/2012   Procedure: ENDARTERECTOMY CAROTID;  Surgeon: Larina Earthly, MD;  Location: [facility] OR;  Service: Vascular;  Laterality: Left;  . HAND SURGERY     bilateral  . HIP FRACTURE SURGERY Left   . HIP PINNING,CANNULATED Left 02/08/2015   Procedure: CANNULATED HIP PINNING;  Surgeon: Kathryne Hitch, MD;  Location: [facility] ORS;  Service: Orthopedics;  Laterality: Left;  . HIP PINNING,CANNULATED Right 03/31/2016   Procedure: CANNULATED HIP PINNING;  Surgeon: Kathryne Hitch, MD;  Location: [facility] ORS;  Service: Orthopedics;  Laterality: Right;  . HYSTEROTOMY    . KNEE ARTHROSCOPY Right 02/14/2018   Procedure: RIGHT KNEE ARTHROSCOPY WITH POSSIBLE PARTIAL MENISCECTOMY;  Surgeon: Kathryne Hitch, MD;  Location: [facility] OR;  Service: Orthopedics;  Laterality: Right;  . MULTIPLE TOOTH EXTRACTIONS    . SPINAL CORD STIMULATOR INSERTION    . TEE WITHOUT CARDIOVERSION  04/12/2012   Procedure: TRANSESOPHAGEAL ECHOCARDIOGRAM (TEE);  Surgeon: Arlys John  Jacalyn Lefevre, MD;  Location: [facility] ENDOSCOPY;  Service: Cardiovascular;  Laterality: N/A;  . TONSILLECTOMY    . TOTAL KNEE ARTHROPLASTY Right 02/06/2019   Procedure: TOTAL KNEE ARTHROPLASTY;  Surgeon: Renette Butters, MD;  Location: [facility] ORS;  Service: Orthopedics;  Laterality: Right;     Allergies:   Allergies  Allergen Reactions  . Penicillins Hives and Rash    Did it involve swelling of the face/tongue/throat, SOB, or low BP? No Did it involve sudden or severe rash/hives, skin peeling, or any reaction on the inside of your mouth or nose? No Did you need to seek medical attention at a hospital or doctor's  office? No When did it last happen?20+ years If all above answers are "NO", may proceed with cephalosporin use.    . Iodinated Diagnostic Agents Hives and Itching    01/11/19: Had CT arthrogram 01/09/19 and developed itching/hives after.  Needs 13-hour prep in the future.  . Morphine And Related Rash  . Hydrochlorothiazide Other (See Comments)    Pt does not remember  . Indomethacin     Pt does not remember  . Paroxetine Hcl     Pt does not remember  . Pravachol Other (See Comments)    Pt does not remember  . Quinine Derivatives     Pt does not remember  . Wellbutrin [Bupropion]     agitation  . Zocor [Simvastatin - High Dose] Other (See Comments)    Weakness/no energy     Social History:  reports that she has quit smoking. Her smoking use included cigarettes. She has a 30.00 pack-year smoking history. She has never used smokeless tobacco. She reports that she does not drink alcohol or use drugs.   Family History: Family History  Problem Relation Age of Onset  . Heart attack Father   . Heart disease Father        before age 67  . Heart disease Sister   . Cancer Sister   . Heart disease Sister      Physical Exam: Vitals:   02/08/19 0801 02/08/19 0853 02/08/19 0854 02/08/19 1032  BP: (!) 152/93     Pulse: 70     Resp:      Temp:      TempSrc:      SpO2: 94% (!) 87% 92% 93%  Weight:      Height:        Constitutional: NAD, fatigue/deconditioned, frail Eyes: Normal ENMT: Normal Neck: Supple CVS: S1, S2 present Respiratory: CTA B Abdomen: Soft, nontender, nondistended, bowel sounds present Musculoskeletal: Right lower extremity dressing clean/dry/intact, no pedal edema noted Neuro: No obvious focal neurologic deficit noted Psych: Normal mood Skin: No rashes noted   Data reviewed:  I have personally reviewed following labs and imaging studies Labs:  CBC: Recent Labs  Lab 02/08/19 1238  WBC 12.9*  NEUTROABS 10.4*  HGB 9.9*  HCT 31.5*  MCV 99.1   PLT 540    Basic Metabolic Panel: Recent Labs  Lab 02/08/19 0713  NA 137  K 4.2  CL 103  CO2 25  GLUCOSE 123*  BUN 27*  CREATININE 1.24*  CALCIUM 8.2*   GFR Estimated Creatinine Clearance: 32.6 mL/min (A) (by C-G formula based on SCr of 1.24 mg/dL (H)). Liver Function Tests: No results for input(s): AST, ALT, ALKPHOS, BILITOT, PROT, ALBUMIN in the last 168 hours. No results for input(s): LIPASE, AMYLASE in the last 168 hours. No results for input(s): AMMONIA in the last 168 hours. Coagulation profile  No results for input(s): INR, PROTIME in the last 168 hours.  Cardiac Enzymes: No results for input(s): CKTOTAL, CKMB, CKMBINDEX, TROPONINI in the last 168 hours. BNP: Invalid input(s): POCBNP CBG: Recent Labs  Lab 02/07/19 1931  GLUCAP 178*   D-Dimer Recent Labs    02/08/19 1238  DDIMER 0.89*   Hgb A1c No results for input(s): HGBA1C in the last 72 hours. Lipid Profile No results for input(s): CHOL, HDL, LDLCALC, TRIG, CHOLHDL, LDLDIRECT in the last 72 hours. Thyroid function studies No results for input(s): TSH, T4TOTAL, T3FREE, THYROIDAB in the last 72 hours.  Invalid input(s): FREET3 Anemia work up No results for input(s): VITAMINB12, FOLATE, FERRITIN, TIBC, IRON, RETICCTPCT in the last 72 hours. Urinalysis    Component Value Date/Time   COLORURINE YELLOW 01/29/2019 1109   APPEARANCEUR CLEAR 01/29/2019 1109   LABSPEC 1.018 01/29/2019 1109   PHURINE 5.0 01/29/2019 1109   GLUCOSEU NEGATIVE 01/29/2019 1109   GLUCOSEU NEGATIVE 07/27/2018 0926   HGBUR SMALL (A) 01/29/2019 1109   BILIRUBINUR NEGATIVE 01/29/2019 1109   BILIRUBINUR Negative 05/20/2017 1505   KETONESUR NEGATIVE 01/29/2019 1109   PROTEINUR NEGATIVE 01/29/2019 1109   UROBILINOGEN 0.2 07/27/2018 0926   NITRITE NEGATIVE 01/29/2019 1109   LEUKOCYTESUR NEGATIVE 01/29/2019 1109     Microbiology Recent Results (from the past 240 hour(s))  Novel Coronavirus, NAA (Hosp order, Send-out to Ref  Lab; TAT 18-24 hrs     Status: None   Collection Time: 02/02/19 10:13 AM   Specimen: Nasopharyngeal Swab; Respiratory  Result Value Ref Range Status   SARS-CoV-2, NAA NOT DETECTED NOT DETECTED Final    Comment: (NOTE) This nucleic acid amplification test was developed and its performance characteristics determined by [facility]. Nucleic acid amplification tests include PCR and TMA. This test has not been FDA cleared or approved. This test has been authorized by FDA under an Emergency Use Authorization (EUA). This test is only authorized for the duration of time the declaration that circumstances exist justifying the authorization of the emergency use of in vitro diagnostic tests for detection of SARS-CoV-2 virus and/or diagnosis of COVID-19 infection under section 564(b)(1) of the Act, 21 U.S.C. 119JYN-8(G360bbb-3(b) (1), unless the authorization is terminated or revoked sooner. When diagnostic testing is negative, the possibility of a false negative result should be considered in the context of a patient's recent exposures and the presence of clinical signs and symptoms consistent with COVID-19. An individual without symptoms of COVID- 19 and who is not shedding SARS-CoV-2 vi rus would expect to have a negative (not detected) result in this assay. Performed At: [facility] [address] [place], [place] [id number] Jolene SchimkeNagendra Sanjai MD VH:[phone]    Coronavirus Source NASOPHARYNGEAL  Final    Comment: Performed at [facility], [address] [place], [place] [address]       Inpatient Medications:   Scheduled Meds: . amLODipine  10 mg Oral Daily  . aspirin  81 mg Oral BID  . atorvastatin  20 mg Oral Daily  . celecoxib  100 mg Oral BID  . docusate sodium  100 mg Oral BID  . donepezil  10 mg Oral QHS  . DULoxetine  30 mg Oral BID  . gabapentin  300 mg Oral Daily  . gabapentin  600 mg Oral QHS  . HYDROcodone Bitartrate ER  40 mg Oral QHS  . metoprolol  tartrate  25 mg Oral BID  . pantoprazole  40 mg Oral Daily   Continuous Infusions:   Radiological  Exams on Admission: Dg Chest Port 1 View  Result Date: 02/07/2019 CLINICAL DATA:  Hypoxia. EXAM: PORTABLE CHEST 1 VIEW COMPARISON:  May 05, 2018. FINDINGS: Stable cardiomegaly. Atherosclerosis of thoracic aorta is noted. No pneumothorax or pleural effusion is noted. No acute pulmonary disease is noted. Bony thorax is unremarkable. IMPRESSION: No active disease. Aortic Atherosclerosis (ICD10-I70.0). Electronically Signed   By: Lupita Raider M.D.   On: 02/07/2019 12:50    Impression/Recommendations Principal Problem:   Primary osteoarthritis of right knee Active Problems:   Hypertension   Hypercholesteremia   Chronic back pain   AAA (abdominal aortic aneurysm) without rupture (HCC)   History of CVA (cerebrovascular accident)   GERD (gastroesophageal reflux disease)   Primary localized osteoarthritis of knee   Acute hypoxic respiratory failure Afebrile, with leukocytosis (likely reactive from recent surgery) Sats dropped into the 80s on room air, corrects to the 90s on 2 L of oxygen Likely multifactorial: ??COPD (history of significant smoking history)/IV narcotics/??OSA/deconditioned/unable to take deep breaths due to pain/HCAP/rule out PE ABG showed PO2 70, PCO2 41.4, pH 7.42 Chest x-ray unremarkable Patient allergic to contrast, so VQ scan ordered VQ scan is negative for PE, patient will need to follow-up with PCP, to wean off O2 and plan for work-up for PFTs for COPD/sleep study as an outpatient Continue supplemental oxygen, consider inhalers as needed Advised to continue incentive spirometry, minimize use of high-dose narcotics Advised home PT/OT to assist in ambulation Follow-up with PCP in 1 week with repeat labs and further management of respiratory failure      Thank you for this consultation, will sign off.  Please call back if further concerns  /recommendation/medical management/ is needed.     Briant Cedar M.D. [facility] [email or web address] Password [facility]  02/08/2019, 4:57 PM

## 2019-02-08 NOTE — Progress Notes (Addendum)
Addendum: Patient with persistent low O2 saturation on room air this morning.  Medicine was consulted for evaluation regarding same.  Lab work was ordered including d-dimer which was positive, but nonspecific especially given recent surgery.  Dr. Percell Miller has discussed with medicine team who is recommending CT to rule out acute PE.  The patient's discharge depends on results of CT and medical clearance.  Charna Elizabeth Martensen III, PA-C 02/08/2019 1:43 PM       Subjective: Patient reports pain as moderate, tolerable.  Tolerating diet.  Urinating.  No CP.  Mobilizing, but with fatigue and low oxygen saturation.  Objective:   VITALS:   Vitals:   02/07/19 2258 02/07/19 2300 02/08/19 0118 02/08/19 0444  BP: (!) 162/73   137/65  Pulse: 73   69  Resp: 14   14  Temp: (!) 97.5 F (36.4 C)   98.5 F (36.9 C)  TempSrc: Oral   Oral  SpO2: 99% 96% 95% 93%  Weight:      Height:       CBC Latest Ref Rng & Units 01/29/2019 01/19/2019 06/06/2018  WBC 4.0 - 10.5 K/uL 8.6 7.1 9.4  Hemoglobin 12.0 - 15.0 g/dL 13.0 12.4 12.6  Hematocrit 36.0 - 46.0 % 42.0 37.8 40.4  Platelets 150 - 400 K/uL 287 295 307   BMP Latest Ref Rng & Units 01/29/2019 01/19/2019 06/06/2018  Glucose 70 - 99 mg/dL 116(H) 140(H) 131(H)  BUN 8 - 23 mg/dL 21 20 29(H)  Creatinine 0.44 - 1.00 mg/dL 1.16(H) 1.14(H) 1.03(H)  BUN/Creat Ratio 6 - 22 (calc) - 18 -  Sodium 135 - 145 mmol/L 141 141 142  Potassium 3.5 - 5.1 mmol/L 3.8 4.1 3.8  Chloride 98 - 111 mmol/L 104 104 105  CO2 22 - 32 mmol/L 27 26 29   Calcium 8.9 - 10.3 mg/dL 9.0 8.4(L) 9.0   Intake/Output      10/14 0701 - 10/15 0700   P.O. 840   I.V. (mL/kg) 0 (0)   Total Intake(mL/kg) 840 (12.8)   Urine (mL/kg/hr) 600 (0.4)   Stool 0   Total Output 600   Net +240       Stool Occurrence 0 x      Physical Exam: General: NAD.  Supine in bed.  Conversant.   Using incentive spirometer. Resp: No increased wob Cardio: regular rate and rhythm ABD soft Neurologically  intact MSK RLE: Neurovascularly intact Sensation intact distally Feet warm Dorsiflexion/Plantar flexion intact Incision: dressing C/D/I   Assessment: 2 Days Post-Op  S/P Procedure(s) (LRB): TOTAL KNEE ARTHROPLASTY (Right) by Dr. Ernesta Amble. Percell Miller on 02/06/2019  Principal Problem:   Primary osteoarthritis of right knee Active Problems:   Hypertension   Hypercholesteremia   Chronic back pain   AAA (abdominal aortic aneurysm) without rupture (HCC)   History of CVA (cerebrovascular accident)   GERD (gastroesophageal reflux disease)   Primary localized osteoarthritis of knee   Primary osteoarthritis, status post right total knee arthroplasty AFVSN Slow mobilization and requiring O2 to maintain saturations. Tolerating diet and voiding Pain controlled Chest x-ray stable and without active disease.  Plan: Up with therapy Incentive Spirometry Minimize narcotics Elevate and Apply ice CPM, bone foam  Weight Bearing: Weight Bearing as Tolerated (WBAT) RLE Dressings: Maintain Mepilex.   VTE prophylaxis: Aspirin, SCDs, ambulation Dispo: Pending O2 saturations  Anticipated LOS more than 2 midnights.  Insurance approval for inpatient due to: - Age 23 and older with one or more of the following:             -  Expected need for hospital services (PT, OT, Nursing) required for safe   discharge             - Active co-morbidities: Chronic pain requiring opiods, Coronary Artery Disease and Stroke   Albina Billet III, PA-C 02/08/2019, 6:59 AM

## 2019-02-09 DIAGNOSIS — M1711 Unilateral primary osteoarthritis, right knee: Principal | ICD-10-CM

## 2019-02-09 MED ORDER — CELECOXIB 100 MG PO CAPS: 100.0000 mg | ORAL_CAPSULE | Freq: Two times a day (BID) | ORAL | 0 refills | Status: AC

## 2019-02-09 MED ORDER — ONDANSETRON HCL 4 MG PO TABS: 4.0000 mg | ORAL_TABLET | Freq: Three times a day (TID) | ORAL | 0 refills | Status: AC | PRN

## 2019-02-09 MED ORDER — OXYCODONE HCL 5 MG PO TABS: 5.0000 mg | ORAL_TABLET | ORAL | 0 refills | Status: AC | PRN

## 2019-02-09 MED ORDER — ACETAMINOPHEN 500 MG PO TABS: 1000.0000 mg | ORAL_TABLET | Freq: Three times a day (TID) | ORAL | 0 refills | Status: AC

## 2019-02-09 MED ORDER — ASPIRIN EC 81 MG PO TBEC: 81.0000 mg | DELAYED_RELEASE_TABLET | Freq: Two times a day (BID) | ORAL | 0 refills | Status: AC

## 2019-02-09 MED ORDER — OMEPRAZOLE 20 MG PO CPDR: 20.0000 mg | DELAYED_RELEASE_CAPSULE | Freq: Every day | ORAL | 0 refills | Status: DC

## 2019-02-09 NOTE — Care Management Important Message (Signed)
Important Message  Patient Details IM Letter given to Velva Harman RN to present to the Patient Name: Sara [name] MRN: [id number] Date of Birth: [birth year]   Medicare Important Message Given:  Yes     Kerin Salen 02/09/2019, 11:50 AM

## 2019-02-09 NOTE — Progress Notes (Signed)
    Durable Medical Equipment  (From admission, onward)         Start     Ordered   02/09/19 1040  For home use only DME oxygen  Once    Question Answer Comment  Length of Need 6 Months   Mode or (Route) Nasal cannula   Liters per Minute 2   Frequency Continuous (stationary and portable oxygen unit needed)   Oxygen conserving device Yes   Oxygen delivery system Gas      02/09/19 1040

## 2019-02-09 NOTE — Progress Notes (Signed)
PROGRESS NOTE  Sara [name]  DOB: November 06, 1940  PCP: Colon Branch, MD [id number]  DOA: 02/06/2019  LOS: 3 days   Brief narrative: Sara [name] is an 78 y.o. female with a history of CVA, AAA, chronic back pain, hypertension, hyperlipidemia, GERD, was admitted by the orthopedics team for total right knee arthroplasty done on 02/06/2019.  Patient was noted to subsequently hypoxic while participating with physical therapy, sats dropped to 82% on ambulation.   Medical consultation was called because of hypoxia.   Patient has history of long duration smoking, currently does not smoke.  She does not have a known history of COPD.  She has never required oxygen in the past. Patient denies any cough, chest pain, congestion, nausea/vomiting, diarrhea, abdominal pain, fever/chills.   [facility] hospitalist asked to consult on the patient, prior to discharge  Subjective: Patient was seen and examined this morning.  Elderly Caucasian female.  Lying down in bed.  On low-flow oxygen.  Daughter at bedside. Feels ready to go home.  Assessment/Plan:  Acute hypoxic respiratory failure with hypoxia -Postoperative drop in oxygen saturation, currently on low-flow oxygen by nasal cannula.   ABG showed PO2 70, PCO2 41.4, pH 7.42 -Multifactorial etiology:  1. Patient most likely has undiagnosed COPD -she has significant history of smoking.  Imaging from the past have shown pleuroparenchymal scaring, reticular changes as well as dilatation of the main pulmonary artery suggestive of pulmonary artery hypertension. 2. has diastolic CHF per echocardiogram from 2019.   3. Given IV narcotics in the hospital 4. may have postoperative atelectasis.  Chest x-ray unremarkable VQ scan is negative for PE, patient will need to follow-up with PCP, to wean off O2 and plan for work-up for PFTs for COPD/sleep study as an outpatient Continue supplemental oxygen,  Advised to continue incentive spirometry, minimize use of  high-dose narcotics Advised home PT/OT to assist in ambulation Follow-up with PCP in 1 week with repeat labs and further management of respiratory failure  Stable from medical standpoint for discharge today to home with home oxygen.  Antimicrobials: Anti-infectives (From admission, onward)   Start     Dose/Rate Route Frequency Ordered Stop   02/06/19 1800  ceFAZolin (ANCEF) IVPB 1 g/50 mL premix     1 g 100 mL/hr over 30 Minutes Intravenous Every 6 hours 02/06/19 1610 02/06/19 2246   02/06/19 0945  ceFAZolin (ANCEF) IVPB 2g/100 mL premix     2 g 200 mL/hr over 30 Minutes Intravenous On call to O.R. 02/06/19 0940 02/06/19 1221      Diet Order            Diet regular Room service appropriate? Yes; Fluid consistency: Thin  Diet effective now              Infusions:    Scheduled Meds: . amLODipine  10 mg Oral Daily  . aspirin  81 mg Oral BID  . atorvastatin  20 mg Oral Daily  . celecoxib  100 mg Oral BID  . docusate sodium  100 mg Oral BID  . donepezil  10 mg Oral QHS  . DULoxetine  30 mg Oral BID  . gabapentin  300 mg Oral Daily  . gabapentin  600 mg Oral QHS  . HYDROcodone Bitartrate ER  40 mg Oral QHS  . metoprolol tartrate  25 mg Oral BID  . pantoprazole  40 mg Oral Daily    PRN meds: acetaminophen, baclofen, diazepam, diphenhydrAMINE, HYDROmorphone (DILAUDID) injection, loratadine, magnesium citrate, menthol-cetylpyridinium **OR** phenol,  metoCLOPramide **OR** metoCLOPramide (REGLAN) injection, ondansetron **OR** ondansetron (ZOFRAN) IV, oxyCODONE, polyethylene glycol, sorbitol   Objective: Vitals:   02/09/19 0845 02/09/19 1011  BP: (!) 167/61 (!) 141/57  Pulse: 65 (!) 57  Resp:  14  Temp:  97.8 F (36.6 C)  SpO2:  99%    Intake/Output Summary (Last 24 hours) at 02/09/2019 1103 Last data filed at 02/09/2019 1026 Gross per 24 hour  Intake 940 ml  Output 600 ml  Net 340 ml   Filed Weights   02/06/19 1848  Weight: 65.7 kg   Weight change:  Body  mass index is 24.11 kg/m.   Physical Exam: General exam: Appears calm and comfortable.  Skin: No rashes, lesions or ulcers. HEENT: Atraumatic, normocephalic, supple neck, no obvious bleeding Lungs: Clear to auscultation bilaterally CVS: Regular rate and rhythm, no murmur GI/Abd soft, nondistended, nontender, bowel sound present CNS: Alert, awake, oriented x3 Psychiatry: Mood appropriate Extremities: No pedal edema, no calf tenderness  Data Review: I have personally reviewed the laboratory data and studies available.  Recent Labs  Lab 02/08/19 1238  WBC 12.9*  NEUTROABS 10.4*  HGB 9.9*  HCT 31.5*  MCV 99.1  PLT 208   Recent Labs  Lab 02/08/19 0713  NA 137  K 4.2  CL 103  CO2 25  GLUCOSE 123*  BUN 27*  CREATININE 1.24*  CALCIUM 8.2*    Lorin Glass, MD  [facility] 02/09/2019

## 2019-02-09 NOTE — TOC Transition Note (Signed)
Transition of Care [facility]) - CM/SW Discharge Note   Patient Details  Name: Sara [name] MRN: [id number] Date of Birth: 1940/07/19  [facility]) CM/SW Contact:  Leeroy Cha, RN Phone Number: 02/09/2019, 10:16 AM   Clinical Narrative:    o2 supplies ordered through [employer] dme   Final next level of care: Home/Self Care Barriers to Discharge: No Barriers Identified   Patient Goals and CMS Choice     Choice offered to / list presented to : Patient  Discharge Placement                       Discharge Plan and Services     Post Acute Care Choice: Durable Medical Equipment          DME Arranged: Oxygen DME Agency: [employer] Date DME Agency Contacted: 02/09/19 Time DME Agency Contacted: 0900 Representative spoke with at DME Agency: [email or web address]            Social Determinants of Health ([place]) Interventions     Readmission Risk Interventions No flowsheet data found.

## 2019-02-09 NOTE — Discharge Summary (Addendum)
Discharge Summary  Patient ID: MINDI AKERSON MRN: [id number] DOB/AGE: 80-Jun-1942 77 y.o.  Admit date: 02/06/2019 Discharge date: 02/09/2019  Admission Diagnoses:  Primary osteoarthritis of right knee  Discharge Diagnoses:  Principal Problem:   Primary osteoarthritis of right knee Active Problems:   Hypertension   Hypercholesteremia   Chronic back pain   AAA (abdominal aortic aneurysm) without rupture (HCC)   History of CVA (cerebrovascular accident)   GERD (gastroesophageal reflux disease)   Primary localized osteoarthritis of knee   Past Medical History:  Diagnosis Date  . Anxiety   . Arthritis   . Bronchitis    hx of  . Carotid artery occlusion   . Chronic pain syndrome    pain management  . Coronary artery disease    sees Dr. Patty Sermons  . Dementia (HCC)   . GERD (gastroesophageal reflux disease)    hx of  . H/O hiatal hernia   . Hypercholesteremia   . Hypertension    all managed by Dr. Haynes Dage bill  . Mitral valve regurgitation    trace  . Non-traumatic compression fracture of thoracic vertebra (HCC)    T9  . Pneumonia 05/27/2017  . S/P insertion of spinal cord stimulator    "i just charge it every couple of weeks , i dont have a remote "   . Stroke (HCC)   . Wears dentures   . Wears glasses     Surgeries: Procedure(s): TOTAL KNEE ARTHROPLASTY on 02/06/2019   Consultants (if any):   Discharged Condition: Improved  Hospital Course: EMALY BOSCHERT is an 78 y.o. female who was admitted 02/06/2019 with a diagnosis of Primary osteoarthritis of right knee and went to the operating room on 02/06/2019 and underwent the above named procedures.    She was given perioperative antibiotics:  Anti-infectives (From admission, onward)   Start     Dose/Rate Route Frequency Ordered Stop   02/06/19 1800  ceFAZolin (ANCEF) IVPB 1 g/50 mL premix     1 g 100 mL/hr over 30 Minutes Intravenous Every 6 hours 02/06/19 1610 02/06/19 2246   02/06/19 0945  ceFAZolin (ANCEF)  IVPB 2g/100 mL premix     2 g 200 mL/hr over 30 Minutes Intravenous On call to O.R. 02/06/19 0940 02/06/19 1221    .  She was given sequential compression devices, early ambulation, and Aspirin for DVT prophylaxis.  Postop day 1 patient was noted to have decreased oxygen saturations mobilizing with therapy.  Next x-ray was unremarkable.  Medicine was consulted VQ scan was ordered- negative for PE.  Etiology determined likely to be multifactorial: COPD(historyof significant smoking).  Significant chronic narcotic use. ?OSA/deconditioned/unable to take deep breaths due to pain?  Follow-up with PCP in 1 week with repeat labs and further management of respiratory failure.  Patient will need to follow-up with PCP, to wean off O2 and plan for work-up for PFTs for COPD/sleep study as an outpatient.  She benefited maximally from the hospital stay and there were no complications.    Recent vital signs:  Vitals:   02/09/19 0845 02/09/19 1011  BP: (!) 167/61 (!) 141/57  Pulse: 65 (!) 57  Resp:  14  Temp:  97.8 F (36.6 C)  SpO2:  99%    Recent laboratory studies:  Lab Results  Component Value Date   HGB 9.9 (L) 02/08/2019   HGB 13.0 01/29/2019   HGB 12.4 01/19/2019   Lab Results  Component Value Date   WBC 12.9 (H) 02/08/2019   PLT 208  02/08/2019   Lab Results  Component Value Date   INR 1.37 06/05/2015   Lab Results  Component Value Date   NA 137 02/08/2019   K 4.2 02/08/2019   CL 103 02/08/2019   CO2 25 02/08/2019   BUN 27 (H) 02/08/2019   CREATININE 1.24 (H) 02/08/2019   GLUCOSE 123 (H) 02/08/2019    Discharge Medications:   Allergies as of 02/09/2019      Reactions   Penicillins Hives, Rash   Did it involve swelling of the face/tongue/throat, SOB, or low BP? No Did it involve sudden or severe rash/hives, skin peeling, or any reaction on the inside of your mouth or nose? No Did you need to seek medical attention at a hospital or doctor's office? No When did it  last happen?20+ years If all above answers are "NO", may proceed with cephalosporin use.   Iodinated Diagnostic Agents Hives, Itching   01/11/19: Had CT arthrogram 01/09/19 and developed itching/hives after.  Needs 13-hour prep in the future.   Morphine And Related Rash   Hydrochlorothiazide Other (See Comments)   Pt does not remember   Indomethacin    Pt does not remember   Paroxetine Hcl    Pt does not remember   Pravachol Other (See Comments)   Pt does not remember   Quinine Derivatives    Pt does not remember   Wellbutrin [bupropion]    agitation   Zocor [simvastatin - High Dose] Other (See Comments)   Weakness/no energy      Medication List    STOP taking these medications   pantoprazole 40 MG tablet Commonly known as: PROTONIX     TAKE these medications   acetaminophen 500 MG tablet Commonly known as: TYLENOL Take 2 tablets (1,000 mg total) by mouth every 8 (eight) hours for 10 days. For Pain. What changed:   when to take this  reasons to take this  additional instructions   amLODipine 10 MG tablet Commonly known as: NORVASC Take 1 tablet (10 mg total) by mouth daily.   aspirin EC 81 MG tablet Take 1 tablet (81 mg total) by mouth 2 (two) times daily. For DVT prophylaxis for 30 days after surgery. What changed:   when to take this  additional instructions   atorvastatin 40 MG tablet Commonly known as: LIPITOR Take 0.5 tablets (20 mg total) by mouth daily.   celecoxib 100 MG capsule Commonly known as: CeleBREX Take 1 capsule (100 mg total) by mouth 2 (two) times daily for 14 days.   diazepam 5 MG tablet Commonly known as: VALIUM TAKE 1 TABLET BY MOUTH EVERY DAY AS NEEDED What changed: reasons to take this   diphenhydrAMINE 25 mg capsule Commonly known as: BENADRYL Take 25 mg by mouth every 6 (six) hours as needed (runny nose).   donepezil 10 MG tablet Commonly known as: ARICEPT Take 1 tablet (10 mg total) by mouth at bedtime.    DULoxetine 30 MG capsule Commonly known as: CYMBALTA Take 1 capsule (30 mg total) by mouth 2 (two) times daily.   gabapentin 300 MG capsule Commonly known as: NEURONTIN TAKE 1 CAPSULE BY MOUTH EVERY MORNING AND 2 CAPSULE BY MOUTH EVERY NIGHT AT BEDTIME What changed:   how much to take  how to take this  when to take this  additional instructions   hydrocortisone 2.5 % cream Apply topically 2 (two) times daily. What changed:   how much to take  when to take this  reasons to take  this   Hysingla ER 40 MG T24a Generic drug: HYDROcodone Bitartrate ER Take 40 mg by mouth at bedtime.   loratadine 10 MG tablet Commonly known as: CLARITIN Take 10 mg by mouth daily as needed for allergies.   metoprolol tartrate 50 MG tablet Commonly known as: LOPRESSOR Take 0.5 tablets (25 mg total) by mouth 2 (two) times daily.   multivitamin with minerals Tabs tablet Take 1 tablet by mouth daily.   omeprazole 20 MG capsule Commonly known as: PriLOSEC Take 1 capsule (20 mg total) by mouth daily. 30 days for gastroprotection while taking NSAIDs.   ondansetron 4 MG tablet Commonly known as: Zofran Take 1 tablet (4 mg total) by mouth every 8 (eight) hours as needed for nausea or vomiting.   oxyCODONE 5 MG immediate release tablet Commonly known as: Roxicodone Take 1 tablet (5 mg total) by mouth every 4 (four) hours as needed for up to 5 days for breakthrough pain.   oxyCODONE-acetaminophen 10-325 MG tablet Commonly known as: PERCOCET Take 1 tablet by mouth 3 (three) times daily.            Durable Medical Equipment  (From admission, onward)         Start     Ordered   02/09/19 1040  For home use only DME oxygen  Once    Question Answer Comment  Length of Need 6 Months   Mode or (Route) Nasal cannula   Liters per Minute 2   Frequency Continuous (stationary and portable oxygen unit needed)   Oxygen conserving device Yes   Oxygen delivery system Gas      02/09/19 1040           Diagnostic Studies: Nm Pulmonary Perfusion  Result Date: 02/08/2019 CLINICAL DATA:  Short of breath, elevated D-dimer EXAM: NUCLEAR MEDICINE PERFUSION LUNG SCAN TECHNIQUE: Perfusion images were obtained in multiple projections after intravenous injection of radiopharmaceutical. Ventilation scans intentionally deferred if perfusion scan and chest x-ray adequate for interpretation during COVID 19 epidemic. RADIOPHARMACEUTICALS:  1.5 mCi Tc-24m MAA IV COMPARISON:  None. FINDINGS: No wedge-shaped peripheral perfusion defect to localize acute pulmonary embolism. IMPRESSION: No scintigraphic evidence of acute pulmonary embolism. Electronically Signed   By: Genevive Bi M.D.   On: 02/08/2019 17:14   Dg Chest Port 1 View  Result Date: 02/07/2019 CLINICAL DATA:  Hypoxia. EXAM: PORTABLE CHEST 1 VIEW COMPARISON:  May 05, 2018. FINDINGS: Stable cardiomegaly. Atherosclerosis of thoracic aorta is noted. No pneumothorax or pleural effusion is noted. No acute pulmonary disease is noted. Bony thorax is unremarkable. IMPRESSION: No active disease. Aortic Atherosclerosis (ICD10-I70.0). Electronically Signed   By: Lupita Raider M.D.   On: 02/07/2019 12:50   Dg Knee Right Port  Result Date: 02/06/2019 CLINICAL DATA:  Right knee arthroplasty EXAM: PORTABLE RIGHT KNEE - 1-2 VIEW COMPARISON:  01/09/2019 FINDINGS: Two-view show total knee arthroplasty. Components appear well positioned. Small amount of fluid and air in the joint as expected IMPRESSION: Good appearance following total knee arthroplasty. Electronically Signed   By: Paulina Fusi M.D.   On: 02/06/2019 17:04    Disposition: Discharge disposition: 01-Home or Self Care       Discharge Instructions    Discharge patient   Complete by: As directed    Today after therapy evaluations if she continues to mobilize safely.   Discharge disposition: 01-Home or Self Care   Discharge patient date: 02/07/2019      Follow-up Information     Sheral Apley, MD.  Specialty: Orthopedic Surgery Contact information: [address] [place] [id number] [phone]          PCP  Follow-up with PCP in 1 week with repeat labs and further management of respiratory failure.  Patient will need to follow-up with PCP, to wean off O2 and plan for work-up for PFTs for COPD/sleep study as an outpatient.  Signed: Albina BilletHenry Calvin Martensen III PA-C 02/09/2019, 1:51 PM

## 2019-02-09 NOTE — Plan of Care (Signed)
RN discussed plan of care with patient.

## 2019-02-09 NOTE — Progress Notes (Addendum)
Patient with persistent low O2 saturation on room air POD2.  Chest XR was unremarkable.  Medicine was consulted for evaluation regarding same.  Lab work was ordered including d-dimer which was positive.  VQ scan was performed and this showed NO PE.  Home O2 and PCP follow up / outpatient work up was recommended.  She was cleared for discharge from a medical perspective.   Subjective: Patient reports pain as improving, controlled.  Tolerating diet.  Urinating.  No CP.  Mobilizing, but with fatigue and low oxygen saturation.  Objective:   VITALS:   Vitals:   02/08/19 1032 02/08/19 1812 02/08/19 2059 02/09/19 0513  BP:  (!) 149/58 (!) 143/59 (!) 154/68  Pulse:  62 70 61  Resp:   17 16  Temp:  98.5 F (36.9 C) 99.1 F (37.3 C) 98.9 F (37.2 C)  TempSrc:  Oral Oral Oral  SpO2: 93% 97% 98% 95%  Weight:      Height:       CBC Latest Ref Rng & Units 02/08/2019 01/29/2019 01/19/2019  WBC 4.0 - 10.5 K/uL 12.9(H) 8.6 7.1  Hemoglobin 12.0 - 15.0 g/dL 0.3(K) 74.2 59.5  Hematocrit 36.0 - 46.0 % 31.5(L) 42.0 37.8  Platelets 150 - 400 K/uL 208 287 295   BMP Latest Ref Rng & Units 02/08/2019 01/29/2019 01/19/2019  Glucose 70 - 99 mg/dL 638(V) 564(P) 329(J)  BUN 8 - 23 mg/dL 18(A) 21 20  Creatinine 0.44 - 1.00 mg/dL 4.16(S) 0.63(K) 1.60(F)  BUN/Creat Ratio 6 - 22 (calc) - - 18  Sodium 135 - 145 mmol/L 137 141 141  Potassium 3.5 - 5.1 mmol/L 4.2 3.8 4.1  Chloride 98 - 111 mmol/L 103 104 104  CO2 22 - 32 mmol/L 25 27 26   Calcium 8.9 - 10.3 mg/dL 8.2(L) 9.0 8.4(L)   Intake/Output      10/15 0701 - 10/16 0700 10/16 0701 - 10/17 0700   P.O. 940    I.V. (mL/kg) 0 (0)    Total Intake(mL/kg) 940 (14.3)    Urine (mL/kg/hr) 900 (0.6)    Stool 0    Total Output 900    Net +40         Urine Occurrence 3 x    Stool Occurrence 0 x       Physical Exam: General: NAD.  Supine in bed.  Alert.  Conversant.    Resp: No increased wob.  O2 96%.  [place] in place. Cardio: regular rate and rhythm ABD  soft Neurologically intact MSK RLE: Neurovascularly intact Sensation intact distally Feet warm Dorsiflexion/Plantar flexion intact Incision: dressing C/D/I   Assessment / Plan: 3 Days Post-Op  S/P Procedure(s) (LRB): TOTAL KNEE ARTHROPLASTY (Right) by Dr. 11/17. Jewel Baize on 02/06/2019  Principal Problem:   Primary osteoarthritis of right knee Active Problems:   Hypertension   Hypercholesteremia   Chronic back pain   AAA (abdominal aortic aneurysm) without rupture (HCC)   History of CVA (cerebrovascular accident)   GERD (gastroesophageal reflux disease)   Primary localized osteoarthritis of knee   Primary osteoarthritis, status post right total knee arthroplasty Ready for discharge. Pain controlled and mobilizing safely with PT and family support.  Incentive Spirometry Minimize narcotics Elevate and Apply ice CPM, bone foam  Weight Bearing: Weight Bearing as Tolerated (WBAT) RLE Dressings: Maintain Mepilex.   VTE prophylaxis: Aspirin, SCDs, ambulation  Acute hypoxic respiratory failure Medicine consulted.  Chest XRay unremarkable.  PE ruled out w/ VQ scan.  Likely multifactorial: COPD (history  of significant smoking).  Significant chronic narcotic use. ?OSA/deconditioned/unable to take deep breaths due to pain?  Follow-up with PCP in 1 week with repeat labs and further management of respiratory failure.  Patient will need to follow-up with PCP, to wean off O2 and plan for work-up for PFTs for COPD/sleep study as an outpatient.   Dispo: Home this morning after home O2 has been arranged.   Anticipated LOS more than 2 midnights.  Insurance approval for inpatient due to: - Age 80 and older with one or more of the following:             - Expected need for hospital services (PT, OT, Nursing) required for safe   discharge             - Active co-morbidities: Chronic pain requiring opiods, Coronary Artery Disease and Stroke    Prudencio Burly III,  PA-C 02/09/2019, 7:58 AM

## 2019-02-09 NOTE — Progress Notes (Signed)
The pt and pt's Daughter was provided with d/c instructions. After discussing the pt's plan of care upon d/c home, the pt and pt's Daughter reported no further questions or concerns.

## 2019-02-12 ENCOUNTER — Telehealth: Payer: Self-pay

## 2019-02-12 NOTE — Telephone Encounter (Signed)
Needs hosp f/u within the next week- Pt is aware- Sherri can you contact Pt to set up at her convenience- okay to work in. (40 mins please).

## 2019-02-12 NOTE — Telephone Encounter (Signed)
Pt daughter calling back to check status.

## 2019-02-13 ENCOUNTER — Telehealth: Payer: Self-pay | Admitting: *Deleted

## 2019-02-13 NOTE — Telephone Encounter (Signed)
Transition Care Management Follow-up Telephone Call   Date discharged?02/09/19   How have you been since you were released from the hospital? Improving each day   Do you understand why you were in the hospital? yes   Do you understand the discharge instructions? yes   Where were you discharged to? Home with husband and daughter   Items Reviewed:  Medications reviewed: "no changes"  Allergies reviewed: "no new allergies"  Dietary changes reviewed: yes  Referrals reviewed: yes   Functional Questionnaire:   Activities of Daily Living (ADLs):   She states they are independent in the following: feeding, continence, grooming and toileting States they require assistance with the following: ambulation, bathing and hygiene, dressing and using walker   Any transportation issues/concerns?: no   Any patient concerns? no   Confirmed importance and date/time of follow-up visits scheduled yes  Provider Appointment booked with PCP 02/19/19 as phone visit per pt request  Confirmed with patient if condition begins to worsen call PCP or go to the ER.  Patient was given the office number and encouraged to call back with question or concerns.  : yes

## 2019-02-13 NOTE — Telephone Encounter (Signed)
Appt scheduled

## 2019-02-19 ENCOUNTER — Other Ambulatory Visit: Payer: Self-pay

## 2019-02-19 ENCOUNTER — Ambulatory Visit (INDEPENDENT_AMBULATORY_CARE_PROVIDER_SITE_OTHER): Payer: Medicare Other | Admitting: Internal Medicine

## 2019-02-19 DIAGNOSIS — R0902 Hypoxemia: Secondary | ICD-10-CM

## 2019-02-19 DIAGNOSIS — M1990 Unspecified osteoarthritis, unspecified site: Secondary | ICD-10-CM

## 2019-02-19 NOTE — Progress Notes (Signed)
Subjective:    Patient ID: Sara [name], female    DOB: Aug 01, 1940, 78 y.o.   MRN: [id number]  DOS:  02/19/2019 Type of visit - description: Attempted  to make this a video visit, due to technical difficulties from the patient side it was not possible  thus we proceeded with a Virtual Visit via Telephone    I connected with@   by telephone and verified that I am speaking with the correct person using two identifiers.  THIS ENCOUNTER IS A VIRTUAL VISIT DUE TO COVID-19 - PATIENT WAS NOT SEEN IN THE OFFICE. PATIENT HAS CONSENTED TO VIRTUAL VISIT / TELEMEDICINE VISIT   Location of patient: home  Location of provider: office  I discussed the limitations, risks, security and privacy concerns of performing an evaluation and management service by telephone and the availability of in person appointments. I also discussed with the patient that there may be a patient responsible charge related to this service. The patient expressed understanding and agreed to proceed.   History of Present Illness: Hospital follow-up The patient was admitted to the hospital for aplanned total knee replacement on 02/06/2019, postop she developed hypoxia while doing physical therapy in the hospital, O2 sat dropped to 82%. She also has some DOE.  There was no cough or chest congestion.  On exam, chest was clear. Medicine team consulted, DDX- multifactorial, question of COPD with significant smoking history, OSA, deconditioning, also possible PE. Chest x-ray unremarkable, VQ scan negative. Was discharged on home oxygen.  Review of Systems Since she arrived home on 02/09/2019 she is doing well. Has check her oxygen several times a day and she has no further hypoxia even when she is active, O2 sat 98%.  Similar readings obtained by physical therapy.  She denies fever chills No chest pain, shortness of breath or DOE. Doing physical therapy at home.  Able to go to the restroom and take showers without major problems.  Doing incentive spirometry. No nausea or vomiting She is taking Tylenol and Celebrex.   Past Medical History:  Diagnosis Date  . Anxiety   . Arthritis   . Bronchitis    hx of  . Carotid artery occlusion   . Chronic pain syndrome    pain management  . Coronary artery disease    sees Dr. Mare Ferrari  . Dementia ([place])   . GERD (gastroesophageal reflux disease)    hx of  . H/O hiatal hernia   . Hypercholesteremia   . Hypertension    all managed by Dr. Guss Bunde bill  . Mitral valve regurgitation    trace  . Non-traumatic compression fracture of thoracic vertebra (HCC)    T9  . Pneumonia 05/27/2017  . S/P insertion of spinal cord stimulator    "i just charge it every couple of weeks , i dont have a remote "   . Stroke ([place])   . Wears dentures   . Wears glasses     Past Surgical History:  Procedure Laterality Date  . BACK SURGERY     x 3 Dr Cay Schillings, last @ [facility] ~ 2005, rods, fusion, four surgery  . CARDIAC CATHETERIZATION  1994  . CAROTID ENDARTERECTOMY    . CARPAL TUNNEL RELEASE     left  . CHOLECYSTECTOMY    . ENDARTERECTOMY  04/24/2012   Procedure: ENDARTERECTOMY CAROTID;  Surgeon: Rosetta Posner, MD;  Location: [place];  Service: Vascular;  Laterality: Left;  . HAND SURGERY     bilateral  . HIP  FRACTURE SURGERY Left   . HIP PINNING,CANNULATED Left 02/08/2015   Procedure: CANNULATED HIP PINNING;  Surgeon: Kathryne Hitch, MD;  Location: [facility];  Service: Orthopedics;  Laterality: Left;  . HIP PINNING,CANNULATED Right 03/31/2016   Procedure: CANNULATED HIP PINNING;  Surgeon: Kathryne Hitch, MD;  Location: [facility];  Service: Orthopedics;  Laterality: Right;  . HYSTEROTOMY    . KNEE ARTHROSCOPY Right 02/14/2018   Procedure: RIGHT KNEE ARTHROSCOPY WITH POSSIBLE PARTIAL MENISCECTOMY;  Surgeon: Kathryne Hitch, MD;  Location: [facility];  Service: Orthopedics;  Laterality: Right;  . MULTIPLE TOOTH EXTRACTIONS    . SPINAL CORD STIMULATOR INSERTION    . TEE  WITHOUT CARDIOVERSION  04/12/2012   Procedure: TRANSESOPHAGEAL ECHOCARDIOGRAM (TEE);  Surgeon: Lewayne Bunting, MD;  Location: [facility];  Service: Cardiovascular;  Laterality: N/A;  . TONSILLECTOMY    . TOTAL KNEE ARTHROPLASTY Right 02/06/2019   Procedure: TOTAL KNEE ARTHROPLASTY;  Surgeon: Sheral Apley, MD;  Location: [facility];  Service: Orthopedics;  Laterality: Right;    Social History   Socioeconomic History  . Marital status: Married    Spouse name: Not on file  . Number of children: 4  . Years of education: Not on file  . Highest education level: Not on file  Occupational History  . Occupation: retired   Engineer, production  . Financial resource strain: Not on file  . Food insecurity    Worry: Not on file    Inability: Not on file  . Transportation needs    Medical: Not on file    Non-medical: Not on file  Tobacco Use  . Smoking status: Former Smoker    Packs/day: 0.50    Years: 60.00    Pack years: 30.00    Types: Cigarettes  . Smokeless tobacco: Never Used  . Tobacco comment: quit 04-2015  Substance and Sexual Activity  . Alcohol use: No    Alcohol/week: 0.0 standard drinks  . Drug use: No  . Sexual activity: Not Currently  Lifestyle  . Physical activity    Days per week: Not on file    Minutes per session: Not on file  . Stress: Not on file  Relationships  . Social Musician on phone: Not on file    Gets together: Not on file    Attends religious service: Not on file    Active member of club or organization: Not on file    Attends meetings of clubs or organizations: Not on file    Relationship status: Not on file  . Intimate partner violence    Fear of current or ex partner: Not on file    Emotionally abused: Not on file    Physically abused: Not on file    Forced sexual activity: Not on file  Other Topics Concern  . Not on file  Social History Narrative   Lives w/ husband in a one story home.     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   Education: high school      Allergies as of 02/19/2019      Reactions   Penicillins Hives, Rash   Did it involve swelling of the face/tongue/throat, SOB, or low BP? No Did it involve sudden or severe rash/hives, skin peeling, or any reaction on the inside of your mouth or nose? No Did you need to seek medical attention at a hospital or doctor's office?  No When did it last happen?20+ years If all above answers are "NO", may proceed with cephalosporin use.   Iodinated Diagnostic Agents Hives, Itching   01/11/19: Had CT arthrogram 01/09/19 and developed itching/hives after.  Needs 13-hour prep in the future.   Morphine And Related Rash   Hydrochlorothiazide Other (See Comments)   Pt does not remember   Indomethacin    Pt does not remember   Paroxetine Hcl    Pt does not remember   Pravachol Other (See Comments)   Pt does not remember   Quinine Derivatives    Pt does not remember   Wellbutrin [bupropion]    agitation   Zocor [simvastatin - High Dose] Other (See Comments)   Weakness/no energy      Medication List       Accurate as of February 19, 2019  3:06 PM. If you have any questions, ask your nurse or doctor.        acetaminophen 500 MG tablet Commonly known as: TYLENOL Take 2 tablets (1,000 mg total) by mouth every 8 (eight) hours for 10 days. For Pain.   amLODipine 10 MG tablet Commonly known as: NORVASC Take 1 tablet (10 mg total) by mouth daily.   aspirin EC 81 MG tablet Take 1 tablet (81 mg total) by mouth 2 (two) times daily. For DVT prophylaxis for 30 days after surgery.   atorvastatin 40 MG tablet Commonly known as: LIPITOR Take 0.5 tablets (20 mg total) by mouth daily.   celecoxib 100 MG capsule Commonly known as: CeleBREX Take 1 capsule (100 mg total) by mouth 2 (two) times daily for 14 days.   diazepam 5 MG tablet Commonly known as: VALIUM TAKE 1 TABLET BY MOUTH EVERY DAY AS NEEDED What changed: reasons to  take this   diphenhydrAMINE 25 mg capsule Commonly known as: BENADRYL Take 25 mg by mouth every 6 (six) hours as needed (runny nose).   donepezil 10 MG tablet Commonly known as: ARICEPT Take 1 tablet (10 mg total) by mouth at bedtime.   DULoxetine 30 MG capsule Commonly known as: CYMBALTA Take 1 capsule (30 mg total) by mouth 2 (two) times daily.   gabapentin 300 MG capsule Commonly known as: NEURONTIN TAKE 1 CAPSULE BY MOUTH EVERY MORNING AND 2 CAPSULE BY MOUTH EVERY NIGHT AT BEDTIME What changed:   how much to take  how to take this  when to take this  additional instructions   hydrocortisone 2.5 % cream Apply topically 2 (two) times daily. What changed:   how much to take  when to take this  reasons to take this   Hysingla ER 40 MG T24a Generic drug: HYDROcodone Bitartrate ER Take 40 mg by mouth at bedtime.   loratadine 10 MG tablet Commonly known as: CLARITIN Take 10 mg by mouth daily as needed for allergies.   metoprolol tartrate 50 MG tablet Commonly known as: LOPRESSOR Take 0.5 tablets (25 mg total) by mouth 2 (two) times daily.   multivitamin with minerals Tabs tablet Take 1 tablet by mouth daily.   omeprazole 20 MG capsule Commonly known as: PriLOSEC Take 1 capsule (20 mg total) by mouth daily. 30 days for gastroprotection while taking NSAIDs.   ondansetron 4 MG tablet Commonly known as: Zofran Take 1 tablet (4 mg total) by mouth every 8 (eight) hours as needed for nausea or vomiting.   oxyCODONE-acetaminophen 10-325 MG tablet Commonly known as: PERCOCET Take 1 tablet by mouth 3 (three) times daily.  Objective:   Physical Exam There were no vitals taken for this visit. Is a virtual phone visit, I spoke with both the patient and her daughter Sara [name]    Assessment     Assessment Pre-diabetes-- a1c 5.9 2013 Neuropathy , NCS 12-2015 and 12/2018  purely sensory, axonal  polyneuropathy  HTN Hyperlipidemia Anxiety/depression,  rarely uses diazepam. Self d/c ssri trial ~ 05/2018 Mild Dementia CV --CAD  --H/o Stroke 2013:non hemorrhagic infarcts scattered throughout the [place] . 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  (Declined CT 11-2018, pt will not agree to procedures  for AAA) MSK: -Osteoporosis  last DEXA -hip fx 01-2015 -Vertebral FX (s/p v-plasty) - R Hip Fx 03-2016 -Chronic back pain , s/p spinal cord stimulator  Dr Jordan LikesSpivey   Abnormal CT abd/pelvis  2017: See full report, some of the findings>> Atherosclerotic ulcer left subclavian T 8 compression fracture Infrarenal abdominal aortic aneurysm 3.5 cm, follow-up 2 years Narrow  left renal artery , SMA occluded with pancreatic collaterals Mild mesenteric adenopathy, possibly stable, consider 2 year follow-up  R distal ureter enhancement per CT: Saw urology 07-2015, Rx a ultrasound   R adrenal adenoma Smoker , quit ~ 04-2015  Urosepsis 05-2015   PLAN: Hypoxia: Postop hypoxia as described above, she was discharged on home oxygen 10 days ago, her O2 sats  since she arrived home are back to normal.  It is checked several times a day and remains normal even with exertion. Etiology not clear, VQ scan negative for PE.  Atelectasis?. Plan: Continue checking O2 sats, they will let me know next week how she is doing, if she continues to do well will ask the oxygen equipment to be removed. 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 Recommend to DC Celebrex, decrease Tylenol to 1 tablet 3 times a day to avoid excess and acetaminophen and continue with OxyContin. If pain is not well controlled she will let me know. Plan: RTC 2 weeks Will need a CMP, CBC and PFTs.       I discussed the assessment and treatment plan with the patient. The patient was provided an opportunity to ask questions and all were answered. The patient agreed with the plan and demonstrated an understanding of the instructions.   The patient was advised to call back or seek an in-person evaluation if the symptoms worsen or if the condition fails to improve as anticipated.  I provided 25  minutes of non-face-to-face time during this encounter.  Willow OraJose , MD

## 2019-02-20 ENCOUNTER — Telehealth: Payer: Self-pay | Admitting: Internal Medicine

## 2019-02-20 NOTE — Assessment & Plan Note (Signed)
Hypoxia: Postop hypoxia as described above, she was discharged on home oxygen 10 days ago, her O2 sats  since she arrived home are back to normal.  It is checked several times a day and remains normal even with exertion. Etiology not clear, VQ scan negative for PE.  Atelectasis?. Plan: Continue checking O2 sats, they will let me know next week how she is doing, if she continues to do well will ask the oxygen equipment to be removed. 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 Recommend to DC Celebrex, decrease Tylenol to 1 tablet 3 times a day to avoid excess and acetaminophen and continue with OxyContin. If pain is not well controlled she will let me know. Plan: RTC 2 weeks Will need a CMP, CBC and PFTs.

## 2019-02-26 ENCOUNTER — Other Ambulatory Visit: Payer: Self-pay | Admitting: Internal Medicine

## 2019-02-26 ENCOUNTER — Telehealth: Payer: Self-pay | Admitting: Internal Medicine

## 2019-02-26 MED ORDER — OMEPRAZOLE 20 MG PO CPDR: 20.0000 mg | DELAYED_RELEASE_CAPSULE | Freq: Every day | ORAL | 3 refills | Status: AC

## 2019-02-26 NOTE — Telephone Encounter (Signed)
Patient daughter Bertram Gala is calling to let Dr. Larose Kells know that the patient no longer is in need of the oxygen tank. Picked up the oxygen tank from [facility]. Was advise that Dr. Larose Kells needs to fill out paper work to return the hzo tank. The fax number [place] - [phone]  Please Advise [employer]- [phone]

## 2019-02-26 NOTE — Telephone Encounter (Signed)
Okay to provide order to remove oxygen

## 2019-02-26 NOTE — Telephone Encounter (Signed)
Please advise 

## 2019-02-26 NOTE — Telephone Encounter (Signed)
Message sent to [facility] rep. Awaiting response.

## 2019-02-27 NOTE — Telephone Encounter (Signed)
Order to d/c O2 and to pick up O2 supplies from home faxed to [facility] at [phone].

## 2019-03-05 ENCOUNTER — Other Ambulatory Visit: Payer: Self-pay

## 2019-03-06 ENCOUNTER — Ambulatory Visit (INDEPENDENT_AMBULATORY_CARE_PROVIDER_SITE_OTHER): Payer: Medicare Other | Admitting: Internal Medicine

## 2019-03-06 ENCOUNTER — Encounter: Payer: Self-pay | Admitting: Internal Medicine

## 2019-03-06 VITALS — BP 149/66 | HR 70 | Temp 96.2°F | Resp 16 | Ht 65.0 in | Wt 137.4 lb

## 2019-03-06 DIAGNOSIS — M810 Age-related osteoporosis without current pathological fracture: Secondary | ICD-10-CM

## 2019-03-06 DIAGNOSIS — M1990 Unspecified osteoarthritis, unspecified site: Secondary | ICD-10-CM | POA: Diagnosis not present

## 2019-03-06 DIAGNOSIS — I10 Essential (primary) hypertension: Secondary | ICD-10-CM

## 2019-03-06 DIAGNOSIS — R0902 Hypoxemia: Secondary | ICD-10-CM | POA: Diagnosis not present

## 2019-03-06 IMAGING — CT CT CHEST W/ CM
2 of 4 series · 15 of 36 positions shown, 18 images · IV contrast (ISOVUE)
Comparison: Chest radiograph earlier this day.  Chest CT 09/01/2015

CLINICAL DATA: Fever. Generalized weakness. Abnormal chest
radiograph.

EXAM:
CT CHEST WITH CONTRAST
TECHNIQUE: Multidetector CT imaging of the chest was performed during
intravenous contrast administration.
CONTRAST:  75mL [id number] IOPAMIDOL ([id number]) INJECTION 61%

[Series 2: axial st · axial · 0.63mm/px · z∈[+1469,+1711]mm · 12 of 143 slices shown, 15 images]
[im 11/143  mediastinal]
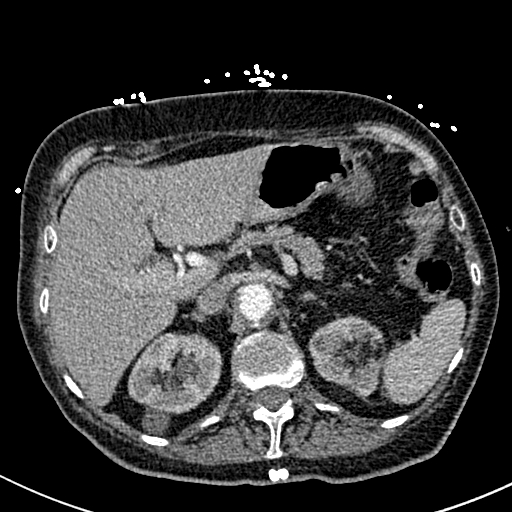
[im 11/143  lung]
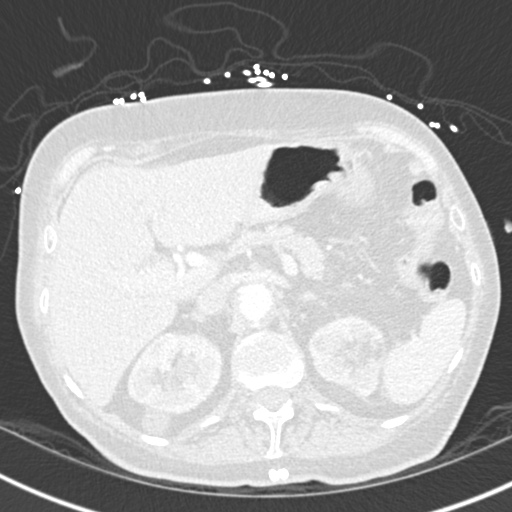
[im 21/143  lung]
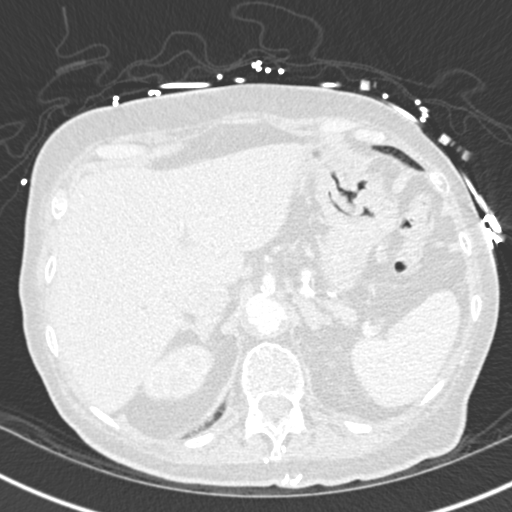
[im 31/143  lung]
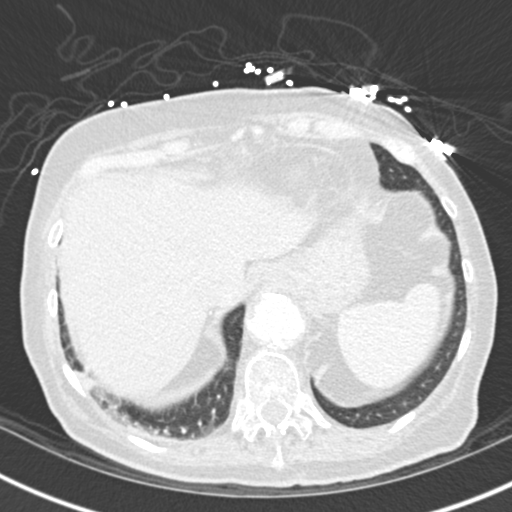
[im 41/143  lung]
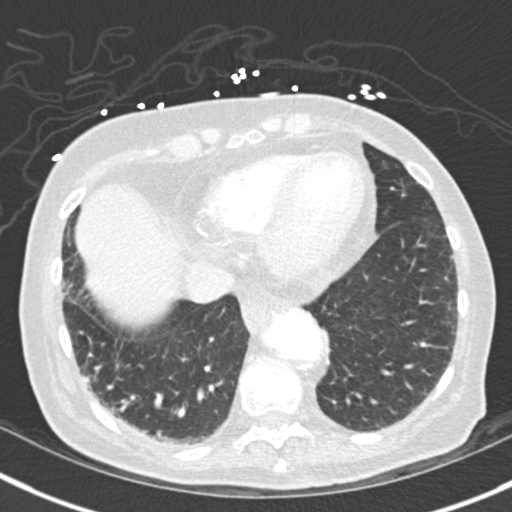
[im 51/143  mediastinal]
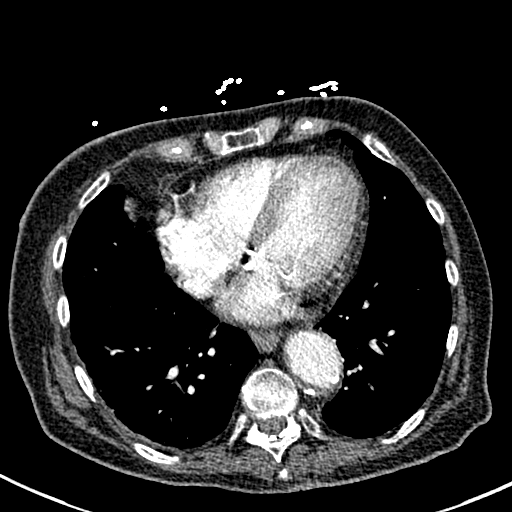
[im 51/143  lung]
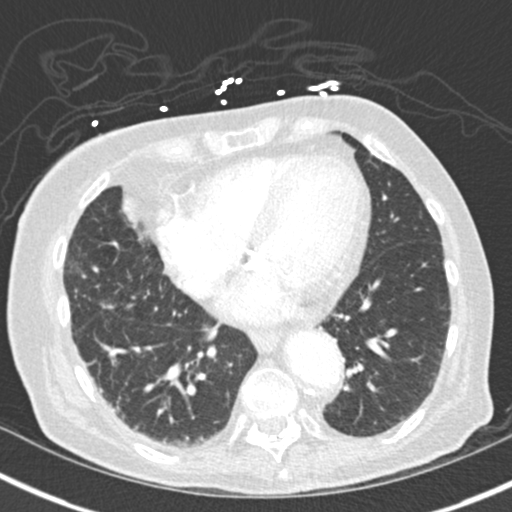
[im 61/143  lung]
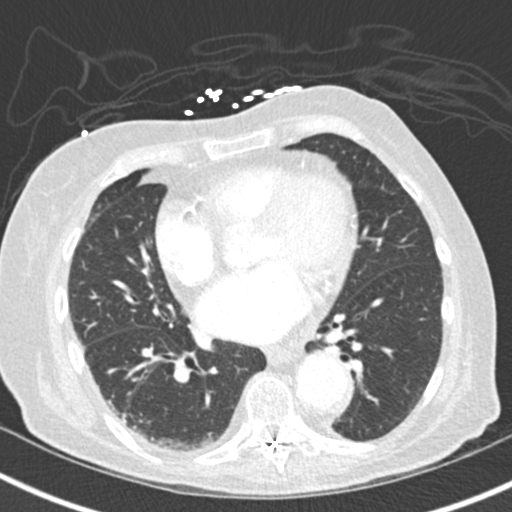
[im 82/143  lung]
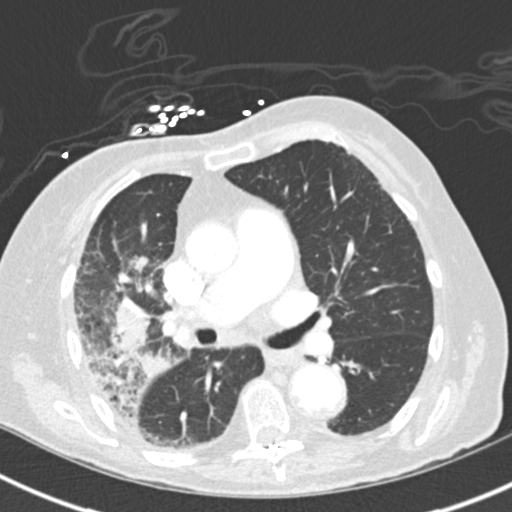
[im 92/143  lung]
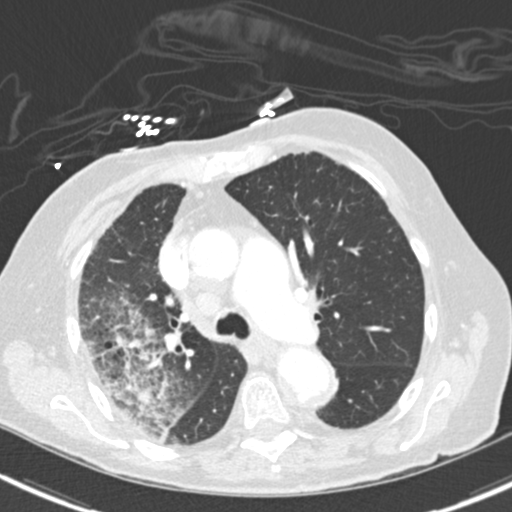
[im 102/143  mediastinal]
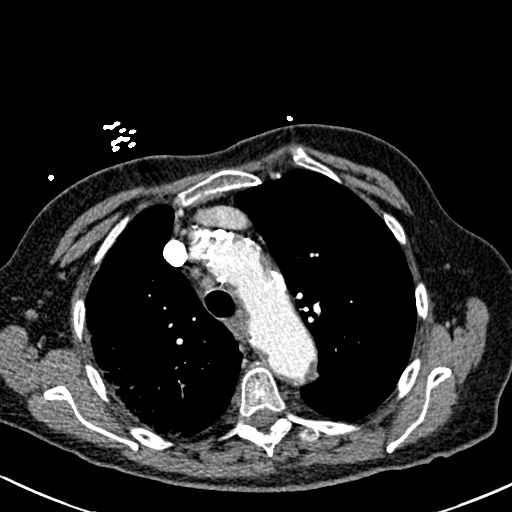
[im 102/143  lung]
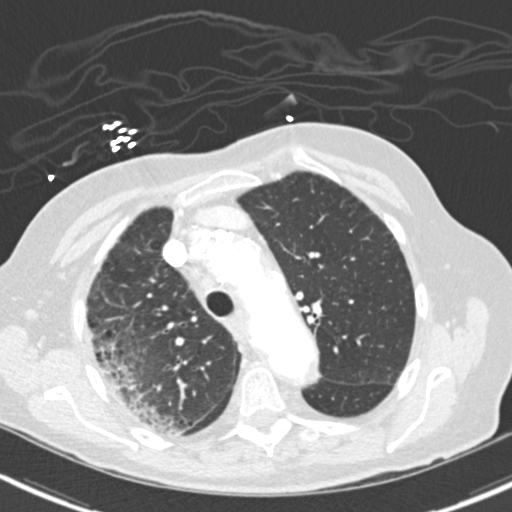
[im 112/143  lung]
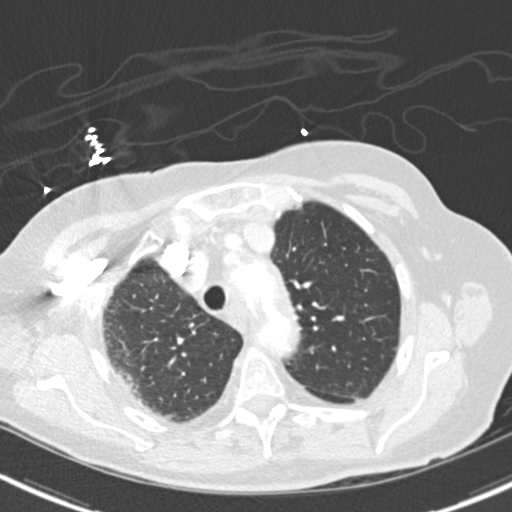
[im 122/143  lung]
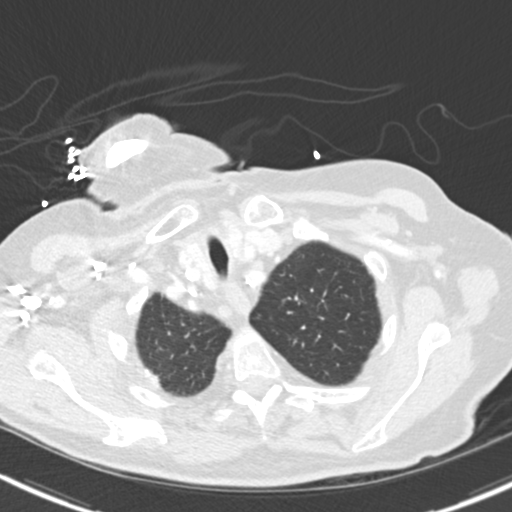
[im 132/143  lung]
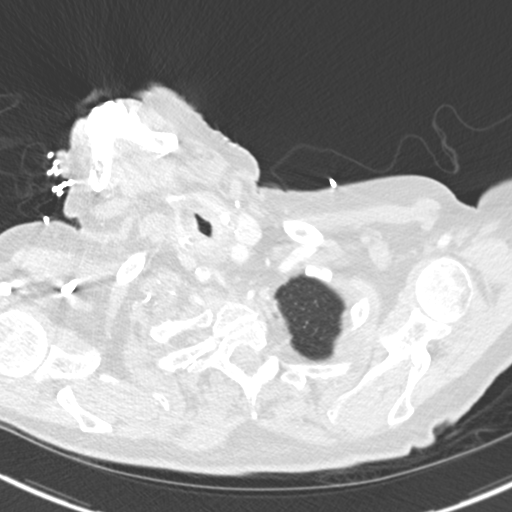

[Series 6: coronal · coronal · 0.55mm/px · 3 of 118 slices shown]
[im 24/118  lung]
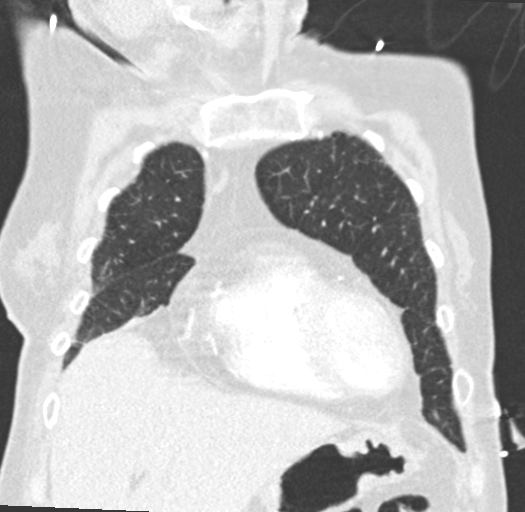
[im 47/118  lung]
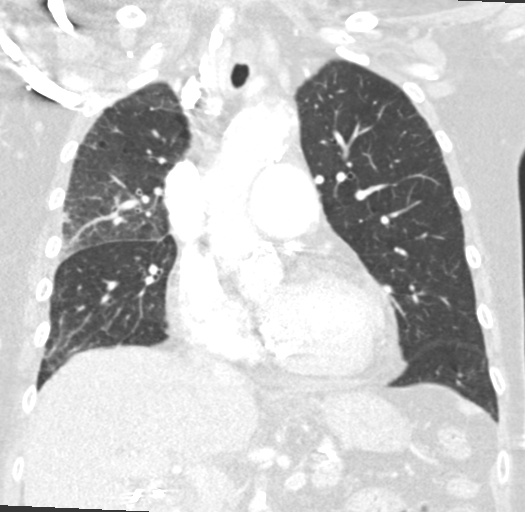
[im 71/118  lung]
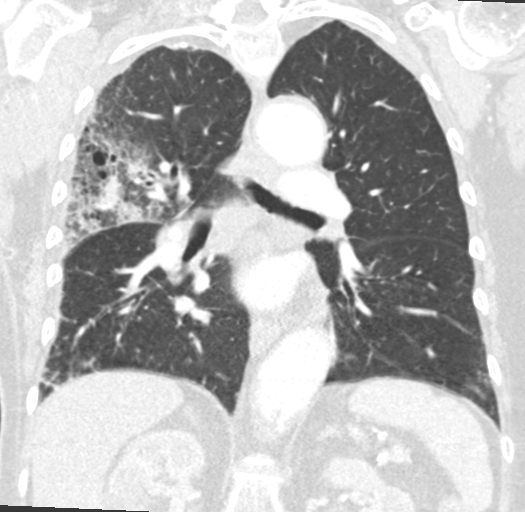

[15 of 36 positions shown; findings below may reference images not displayed]

FINDINGS: Cardiovascular: Tortuous thoracic aorta with diffuse calcified and
noncalcified irregular atheromatous plaque. Greatest aortic
dimension about the proximal descending measuring 3.7 cm, unchanged.
Atherosclerotic disease at the origin of the great vessels again
seen. No aortic dissection or evidence of acute aortic abnormality.
Mild cardiomegaly dense coronary artery calcifications. Dilatation
of the main pulmonary artery measuring 3.4 cm. No central pulmonary
embolus. No pericardial effusion.

Mediastinum/Nodes: Enlarged lower paratracheal node measuring 16 mm
short axis. Small but not enlarged right hilar nodes, largest in the
infrahilar region measuring 7 mm short axis. No left hilar
adenopathy. The esophagus is decompressed. No thyroid nodule.

Lungs/Pleura: Right upper lobe airspace disease with patchy,
ground-glass, and nodular/consolidative components. Findings are
most prominent in the perihilar/basilar portion of the upper lobe,
however there is no central obstructing lesion. Right upper lobe
bronchial thickening. No significant debris in the trachea or
mainstem bronchi. Biapical pleuroparenchymal scarring, partially
calcified. Minimal subpleural reticulation in the right lower lobe
and left upper lobe. No pulmonary nodules demonstrated elsewhere. No
pleural fluid..

Upper Abdomen: Abdominal aortic aneurysm only partially included,
unchanged were visualized. Again seen right renal cyst and cortical
scarring of the upper left kidney. No acute finding.

Musculoskeletal: T8 compression fracture with vertebral
augmentation. Spinal stimulator with tips posterior to T7. No acute
osseous abnormalities. No evidence of focal osseous lesion.
IMPRESSION: 1. Right upper lobe airspace disease with patchy, ground-glass,
nodular/consolidative components. Differential consideration of
infection versus neoplasm. Given fever, recommend short interval
follow-up after a trial of antibiotic therapy to evaluate for
resolution. No central obstructing lesion.
2. Enlarged lower paratracheal lymph node is nonspecific.
3. Advanced irregular aortic atherosclerosis, similar to 09/01/2015
CTA. Coronary artery calcifications.
4. Dilatation of the main pulmonary artery suggesting pulmonary
arterial hypertension.

Aortic Atherosclerosis ([id number]-1GB.B).

## 2019-03-06 MED ORDER — DENOSUMAB 60 MG/ML ~~LOC~~ SOSY
60.0000 mg | PREFILLED_SYRINGE | Freq: Once | SUBCUTANEOUS | Status: AC
  Administered 2019-03-06: 15:00:00 60 mg via SUBCUTANEOUS

## 2019-03-06 NOTE — Progress Notes (Signed)
Subjective:    Patient ID: Sara [name], female    DOB: [birth year], 78 y.o.   MRN: [id number]  DOS:  03/06/2019 Type of visit - description: Follow-up, here with her daughter [name] In general feeling well. Recuperating from knee surgery. Today was her last physical therapy visit, "they really work on my knee" and she is more sore than usual.  That is the reason why she thinks her BP is slightly elevated today.  BP Readings from Last 3 Encounters:  03/06/19 (!) 149/66  02/09/19 (!) 141/57  01/29/19 (!) 147/72     Review of Systems No fever chills No chest pain difficulty breathing No lower extremity edema No cough Past Medical History:  Diagnosis Date  . Anxiety   . Arthritis   . Bronchitis    hx of  . Carotid artery occlusion   . Chronic pain syndrome    pain management  . Coronary artery disease    sees Dr. Patty Sermons  . Dementia (HCC)   . GERD (gastroesophageal reflux disease)    hx of  . H/O hiatal hernia   . Hypercholesteremia   . Hypertension    all managed by Dr. Haynes Dage bill  . Mitral valve regurgitation    trace  . Non-traumatic compression fracture of thoracic vertebra (HCC)    T9  . Pneumonia 05/27/2017  . S/P insertion of spinal cord stimulator    "i just charge it every couple of weeks , i dont have a remote "   . Stroke (HCC)   . Wears dentures   . Wears glasses     Past Surgical History:  Procedure Laterality Date  . BACK SURGERY     x 3 Dr Juliene Pina, last @ [facility] ~ 2005, rods, fusion, four surgery  . CARDIAC CATHETERIZATION  1994  . CAROTID ENDARTERECTOMY    . CARPAL TUNNEL RELEASE     left  . CHOLECYSTECTOMY    . ENDARTERECTOMY  04/24/2012   Procedure: ENDARTERECTOMY CAROTID;  Surgeon: Larina Earthly, MD;  Location: [facility] OR;  Service: Vascular;  Laterality: Left;  . HAND SURGERY     bilateral  . HIP FRACTURE SURGERY Left   . HIP PINNING,CANNULATED Left 02/08/2015   Procedure: CANNULATED HIP PINNING;  Surgeon: Kathryne Hitch, MD;   Location: [facility] ORS;  Service: Orthopedics;  Laterality: Left;  . HIP PINNING,CANNULATED Right 03/31/2016   Procedure: CANNULATED HIP PINNING;  Surgeon: Kathryne Hitch, MD;  Location: [facility] ORS;  Service: Orthopedics;  Laterality: Right;  . HYSTEROTOMY    . KNEE ARTHROSCOPY Right 02/14/2018   Procedure: RIGHT KNEE ARTHROSCOPY WITH POSSIBLE PARTIAL MENISCECTOMY;  Surgeon: Kathryne Hitch, MD;  Location: [facility] OR;  Service: Orthopedics;  Laterality: Right;  . MULTIPLE TOOTH EXTRACTIONS    . SPINAL CORD STIMULATOR INSERTION    . TEE WITHOUT CARDIOVERSION  04/12/2012   Procedure: TRANSESOPHAGEAL ECHOCARDIOGRAM (TEE);  Surgeon: Lewayne Bunting, MD;  Location: [facility] ENDOSCOPY;  Service: Cardiovascular;  Laterality: N/A;  . TONSILLECTOMY    . TOTAL KNEE ARTHROPLASTY Right 02/06/2019   Procedure: TOTAL KNEE ARTHROPLASTY;  Surgeon: Sheral Apley, MD;  Location: [facility] ORS;  Service: Orthopedics;  Laterality: Right;    Social History   Socioeconomic History  . Marital status: Married    Spouse name: Not on file  . Number of children: 4  . Years of education: Not on file  . Highest education level: Not on file  Occupational History  . Occupation: retired   Chief Executive Officer  Needs  . Financial resource strain: Not on file  . Food insecurity    Worry: Not on file    Inability: Not on file  . Transportation needs    Medical: Not on file    Non-medical: Not on file  Tobacco Use  . Smoking status: Former Smoker    Packs/day: 0.50    Years: 60.00    Pack years: 30.00    Types: Cigarettes  . Smokeless tobacco: Never Used  . Tobacco comment: quit 04-2015  Substance and Sexual Activity  . Alcohol use: No    Alcohol/week: 0.0 standard drinks  . Drug use: No  . Sexual activity: Not Currently  Lifestyle  . Physical activity    Days per week: Not on file    Minutes per session: Not on file  . Stress: Not on file  Relationships  . Social Musicianconnections    Talks on phone: Not on file    Gets  together: Not on file    Attends religious service: Not on file    Active member of club or organization: Not on file    Attends meetings of clubs or organizations: Not on file    Relationship status: Not on file  . Intimate partner violence    Fear of current or ex partner: Not on file    Emotionally abused: Not on file    Physically abused: Not on file    Forced sexual activity: Not on file  Other Topics Concern  . Not on file  Social History Narrative   Lives w/ husband in a one story home.     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   Education: high school      Allergies as of 03/06/2019      Reactions   Penicillins Hives, Rash   Did it involve swelling of the face/tongue/throat, SOB, or low BP? No Did it involve sudden or severe rash/hives, skin peeling, or any reaction on the inside of your mouth or nose? No Did you need to seek medical attention at a hospital or doctor's office? No When did it last happen?20+ years If all above answers are "NO", may proceed with cephalosporin use.   Iodinated Diagnostic Agents Hives, Itching   01/11/19: Had CT arthrogram 01/09/19 and developed itching/hives after.  Needs 13-hour prep in the future.   Morphine And Related Rash   Hydrochlorothiazide Other (See Comments)   Pt does not remember   Indomethacin    Pt does not remember   Paroxetine Hcl    Pt does not remember   Pravachol Other (See Comments)   Pt does not remember   Quinine Derivatives    Pt does not remember   Wellbutrin [bupropion]    agitation   Zocor [simvastatin - High Dose] Other (See Comments)   Weakness/no energy      Medication List       Accurate as of March 06, 2019 11:59 PM. If you have any questions, ask your nurse or doctor.        amLODipine 10 MG tablet Commonly known as: NORVASC Take 1 tablet (10 mg total) by mouth daily.   aspirin EC 81 MG tablet Take 1 tablet (81 mg total) by mouth 2 (two) times  daily. For DVT prophylaxis for 30 days after surgery.   atorvastatin 40 MG tablet Commonly known as: LIPITOR Take 0.5 tablets (20 mg total) by mouth daily.  diazepam 5 MG tablet Commonly known as: VALIUM TAKE 1 TABLET BY MOUTH EVERY DAY AS NEEDED What changed: reasons to take this   diphenhydrAMINE 25 mg capsule Commonly known as: BENADRYL Take 25 mg by mouth every 6 (six) hours as needed (runny nose).   donepezil 10 MG tablet Commonly known as: ARICEPT Take 1 tablet (10 mg total) by mouth at bedtime.   DULoxetine 30 MG capsule Commonly known as: CYMBALTA Take 1 capsule (30 mg total) by mouth 2 (two) times daily.   gabapentin 300 MG capsule Commonly known as: NEURONTIN TAKE 1 CAPSULE BY MOUTH EVERY MORNING AND 2 CAPSULE BY MOUTH EVERY NIGHT AT BEDTIME What changed:   how much to take  how to take this  when to take this  additional instructions   hydrocortisone 2.5 % cream Apply topically 2 (two) times daily. What changed:   how much to take  when to take this  reasons to take this   Hysingla ER 40 MG T24a Generic drug: HYDROcodone Bitartrate ER Take 40 mg by mouth at bedtime.   loratadine 10 MG tablet Commonly known as: CLARITIN Take 10 mg by mouth daily as needed for allergies.   metoprolol tartrate 50 MG tablet Commonly known as: LOPRESSOR Take 0.5 tablets (25 mg total) by mouth 2 (two) times daily.   multivitamin with minerals Tabs tablet Take 1 tablet by mouth daily.   omeprazole 20 MG capsule Commonly known as: PriLOSEC Take 1 capsule (20 mg total) by mouth daily. 30 days for gastroprotection while taking NSAIDs.   ondansetron 4 MG tablet Commonly known as: Zofran Take 1 tablet (4 mg total) by mouth every 8 (eight) hours as needed for nausea or vomiting.   oxyCODONE-acetaminophen 10-325 MG tablet Commonly known as: PERCOCET Take 1 tablet by mouth 3 (three) times daily.           Objective:   Physical Exam BP (!) 149/66 (BP  Location: Left Arm, Patient Position: Sitting, Cuff Size: Normal)   Pulse 70   Temp (!) 96.2 F (35.7 C) (Temporal)   Resp 16   Ht 5\' 5"  (1.651 m)   Wt 137 lb 6 oz (62.3 kg)   SpO2 96%   BMI 22.86 kg/m  General:   Well developed, NAD, BMI noted. HEENT:  Normocephalic . Face symmetric, atraumatic Lungs:  decrease breath sounds but clear Normal respiratory effort, no intercostal retractions, no accessory muscle use. Heart: RRR,  no murmur.  No pretibial edema bilaterally Lower extremities: Knee surgical scar without redness or swelling Skin: Not pale. Not jaundice Neurologic:  alert & oriented X3.  Speech normal, gait not tested Psych--  Cognition and judgment appear intact.  Cooperative with normal attention span and concentration.  Behavior appropriate. No anxious or depressed appearing.      Assessment      Assessment Pre-diabetes-- a1c 5.9 2013 Neuropathy , NCS 12-2015 and 12/2018  purely sensory, axonal  polyneuropathy  HTN Hyperlipidemia Anxiety/depression, rarely uses diazepam. Self d/c ssri trial ~ 05/2018 Mild Dementia CV --CAD  --H/o Stroke 2013:non hemorrhagic infarcts scattered throughout the [place] Hem[place] . 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  (Declined CT 11-2018, pt will not agree to procedures  for AAA) MSK: -Osteoporosis  last DEXA -hip fx 01-2015 -Vertebral FX (s/p v-plasty) - R Hip Fx  03-2016 -Chronic back pain , s/p spinal cord stimulator  Dr Vira Blanco   Abnormal CT abd/pelvis  2017: See full report, some of the findings>> Atherosclerotic ulcer left subclavian T 8 compression fracture Infrarenal abdominal aortic aneurysm 3.5 cm, follow-up 2 years Narrow  left renal artery , SMA occluded with pancreatic collaterals Mild mesenteric adenopathy, possibly stable,  consider 2 year follow-up  R distal ureter enhancement per CT: Saw urology 07-2015, Rx a ultrasound   R adrenal adenoma Smoker , quit ~ 04-2015  Urosepsis 05-2015   PLAN Hypoxia: See last visit, has postop hypoxia, resolved.  Previously we talk about doing PFTs however the patient has no symptoms, PFT is unlikely to change management so we agreed not to pursue it at this point. DJD: Continue recuperating from right TKR, has last PT today, she is very sore from it but overall QOL has improved and pain in general has decreased. HTN: Slightly elevated today, check a CMP and CBC Osteoporosis: Prolia today RTC 4 months

## 2019-03-06 NOTE — Patient Instructions (Addendum)
Please schedule Medicare Wellness with Glenard Haring.   GO TO THE LAB : Get the blood work     GO TO THE FRONT DESK Schedule your next appointment for a checkup in 4 months   Check your blood pressure weekly BP GOAL is between 110/65 and  135/85. If it is consistently higher or lower, let me know

## 2019-03-06 NOTE — Progress Notes (Signed)
Pre visit review using our clinic review tool, if applicable. No additional management support is needed unless otherwise documented below in the visit note. 

## 2019-03-07 LAB — COMPREHENSIVE METABOLIC PANEL
ALT: 7 U/L (ref 0–35)
AST: 12 U/L (ref 0–37)
Albumin: 3.9 g/dL (ref 3.5–5.2)
Alkaline Phosphatase: 105 U/L (ref 39–117)
BUN: 24 mg/dL — ABNORMAL HIGH (ref 6–23)
CO2: 30 mEq/L (ref 19–32)
Calcium: 9.4 mg/dL (ref 8.4–10.5)
Chloride: 102 mEq/L (ref 96–112)
Creatinine, Ser: 1.25 mg/dL — ABNORMAL HIGH (ref 0.40–1.20)
GFR: 41.15 mL/min — ABNORMAL LOW (ref >60.00)
Glucose, Bld: 108 mg/dL — ABNORMAL HIGH (ref 70–99)
Potassium: 4.8 mEq/L (ref 3.5–5.1)
Sodium: 140 mEq/L (ref 135–145)
Total Bilirubin: 0.6 mg/dL (ref 0.2–1.2)
Total Protein: 6.6 g/dL (ref 6.0–8.3)

## 2019-03-07 LAB — CBC WITH DIFFERENTIAL/PLATELET
Basophils Absolute: 0.1 10*3/uL (ref 0.0–0.1)
Basophils Relative: 1 % (ref 0.0–3.0)
Eosinophils Absolute: 0.3 10*3/uL (ref 0.0–0.7)
Eosinophils Relative: 3.1 % (ref 0.0–5.0)
HCT: 37.8 % (ref 36.0–46.0)
Hemoglobin: 12.4 g/dL (ref 12.0–15.0)
Lymphocytes Relative: 30.8 % (ref 12.0–46.0)
Lymphs Abs: 2.7 10*3/uL (ref 0.7–4.0)
MCHC: 32.9 g/dL (ref 30.0–36.0)
MCV: 94.2 fl (ref 78.0–100.0)
Monocytes Absolute: 0.7 10*3/uL (ref 0.1–1.0)
Monocytes Relative: 8 % (ref 3.0–12.0)
Neutro Abs: 4.9 10*3/uL (ref 1.4–7.7)
Neutrophils Relative %: 57.1 % (ref 43.0–77.0)
Platelets: 339 10*3/uL (ref 150.0–400.0)
RBC: 4.01 Mil/uL (ref 3.87–5.11)
RDW: 14.2 % (ref 11.5–15.5)
WBC: 8.6 10*3/uL (ref 4.0–10.5)

## 2019-03-07 NOTE — Assessment & Plan Note (Signed)
Hypoxia: See last visit, has postop hypoxia, resolved.  Previously we talk about doing PFTs however the patient has no symptoms, PFT is unlikely to change management so we agreed not to pursue it at this point. DJD: Continue recuperating from right TKR, has last PT today, she is very sore from it but overall QOL has improved and pain in general has decreased. HTN: Slightly elevated today, check a CMP and CBC Osteoporosis: Prolia today RTC 4 months

## 2019-03-15 NOTE — Progress Notes (Deleted)
Virtual Visit via Video Note  I connected with patient on 03/16/19 at  2:30 PM EST by a video enabled telemedicine application and verified that I am speaking with the correct person using two identifiers.   THIS ENCOUNTER IS A VIRTUAL VISIT DUE TO COVID-19 - PATIENT WAS NOT SEEN IN THE OFFICE. PATIENT HAS CONSENTED TO VIRTUAL VISIT / TELEMEDICINE VISIT   Location of patient: home  Location of provider: office  I discussed the limitations of evaluation and management by telemedicine and the availability of in person appointments. The patient expressed understanding and agreed to proceed.   Subjective:   Sara [name] is a 78 y.o. female who presents for Medicare Annual (Subsequent) preventive examination.  Review of Systems:  Home Safety/Smoke Alarms: Feels safe in home. Smoke alarms in place.  Lives w/ husband in 1 story home.  Female:      Mammo- declines       Dexa scan- declines CCS- declines     Objective:     Vitals: There were no vitals taken for this visit.  There is no height or weight on file to calculate BMI.  Advanced Directives 02/06/2019 01/29/2019 06/06/2018 02/14/2018 09/26/2017 09/06/2017 07/13/2017  Does Patient Have a Medical Advance Directive? Yes Yes Yes Yes Yes Yes Yes  Type of Advance Directive Living will Living will Healthcare Power of [place];Living will Healthcare Power of [place];Living will Healthcare Power of [place];Living will Healthcare Power of [employer] of [place];Living will  Does patient want to make changes to medical advance directive? No - Patient declined No - Patient declined No - Patient declined No - Patient declined - - No - Patient declined  Copy of Healthcare Power of Attorney in Chart? - - No - copy requested - Yes - No - copy requested  Would patient like information on creating a medical advance directive? - - - - - - -  Pre-existing out of facility DNR order (yellow form or pink MOST form) - - - - - - -     Tobacco Social History   Tobacco Use  Smoking Status Former Smoker  . Packs/day: 0.50  . Years: 60.00  . Pack years: 30.00  . Types: Cigarettes  Smokeless Tobacco Never Used  Tobacco Comment   quit 04-2015     Counseling given: Not Answered Comment: quit 04-2015   Clinical Intake:                       Past Medical History:  Diagnosis Date  . Anxiety   . Arthritis   . Bronchitis    hx of  . Carotid artery occlusion   . Chronic pain syndrome    pain management  . Coronary artery disease    sees Dr. Patty Sermons  . Dementia (HCC)   . GERD (gastroesophageal reflux disease)    hx of  . H/O hiatal hernia   . Hypercholesteremia   . Hypertension    all managed by Dr. Haynes Dage bill  . Mitral valve regurgitation    trace  . Non-traumatic compression fracture of thoracic vertebra (HCC)    T9  . Pneumonia 05/27/2017  . S/P insertion of spinal cord stimulator    "i just charge it every couple of weeks , i dont have a remote "   . Stroke (HCC)   . Wears dentures   . Wears glasses    Past Surgical History:  Procedure Laterality Date  . BACK SURGERY  x 3 Dr Juliene Pina, last @ [facility] ~ 2005, rods, fusion, four surgery  . CARDIAC CATHETERIZATION  1994  . CAROTID ENDARTERECTOMY    . CARPAL TUNNEL RELEASE     left  . CHOLECYSTECTOMY    . ENDARTERECTOMY  04/24/2012   Procedure: ENDARTERECTOMY CAROTID;  Surgeon: Larina Earthly, MD;  Location: [facility] OR;  Service: Vascular;  Laterality: Left;  . HAND SURGERY     bilateral  . HIP FRACTURE SURGERY Left   . HIP PINNING,CANNULATED Left 02/08/2015   Procedure: CANNULATED HIP PINNING;  Surgeon: Kathryne Hitch, MD;  Location: [facility] ORS;  Service: Orthopedics;  Laterality: Left;  . HIP PINNING,CANNULATED Right 03/31/2016   Procedure: CANNULATED HIP PINNING;  Surgeon: Kathryne Hitch, MD;  Location: [facility] ORS;  Service: Orthopedics;  Laterality: Right;  . HYSTEROTOMY    . KNEE ARTHROSCOPY Right 02/14/2018   Procedure:  RIGHT KNEE ARTHROSCOPY WITH POSSIBLE PARTIAL MENISCECTOMY;  Surgeon: Kathryne Hitch, MD;  Location: [facility] OR;  Service: Orthopedics;  Laterality: Right;  . MULTIPLE TOOTH EXTRACTIONS    . SPINAL CORD STIMULATOR INSERTION    . TEE WITHOUT CARDIOVERSION  04/12/2012   Procedure: TRANSESOPHAGEAL ECHOCARDIOGRAM (TEE);  Surgeon: Lewayne Bunting, MD;  Location[facility]COPY;  Service: Cardiovascular;  Laterality: N/A;  . TONSILLECTOMY    . TOTAL KNEE ARTHROPLASTY Right 02/06/2019   Procedure: TOTAL KNEE ARTHROPLASTY;  Surgeon: Sheral Apley, MD;  Location: [facility] ORS;  Service: Orthopedics;  Laterality: Right;   Family History  Problem Relation Age of Onset  . Heart attack Father   . Heart disease Father        before age 91  . Heart disease Sister   . Cancer Sister   . Heart disease Sister    Social History   Socioeconomic History  . Marital status: Married    Spouse name: Not on file  . Number of children: 4  . Years of education: Not on file  . Highest education level: Not on file  Occupational History  . Occupation: retired   Engineer, production  . Financial resource strain: Not on file  . Food insecurity    Worry: Not on file    Inability: Not on file  . Transportation needs    Medical: Not on file    Non-medical: Not on file  Tobacco Use  . Smoking status: Former Smoker    Packs/day: 0.50    Years: 60.00    Pack years: 30.00    Types: Cigarettes  . Smokeless tobacco: Never Used  . Tobacco comment: quit 04-2015  Substance and Sexual Activity  . Alcohol use: No    Alcohol/week: 0.0 standard drinks  . Drug use: No  . Sexual activity: Not Currently  Lifestyle  . Physical activity    Days per week: Not on file    Minutes per session: Not on file  . Stress: Not on file  Relationships  . Social Musician on phone: Not on file    Gets together: Not on file    Attends religious service: Not on file    Active member of club or organization: Not on file     Attends meetings of clubs or organizations: Not on file    Relationship status: Not on file  Other Topics Concern  . Not on file  Social History Narrative   Lives w/ husband in a one story home.     Does not drive    4 daughters ,  2 in [place])    Retired: Educational psychologist.   Education: high school    Outpatient Encounter Medications as of 03/16/2019  Medication Sig  . amLODipine (NORVASC) 10 MG tablet Take 1 tablet (10 mg total) by mouth daily.  Marland Kitchen aspirin EC 81 MG tablet Take 1 tablet (81 mg total) by mouth 2 (two) times daily. For DVT prophylaxis for 30 days after surgery.  Marland Kitchen atorvastatin (LIPITOR) 40 MG tablet Take 0.5 tablets (20 mg total) by mouth daily.  . diazepam (VALIUM) 5 MG tablet TAKE 1 TABLET BY MOUTH EVERY DAY AS NEEDED (Patient taking differently: Take 5 mg by mouth daily as needed for anxiety. )  . diphenhydrAMINE (BENADRYL) 25 mg capsule Take 25 mg by mouth every 6 (six) hours as needed (runny nose).  Marland Kitchen donepezil (ARICEPT) 10 MG tablet Take 1 tablet (10 mg total) by mouth at bedtime.  . DULoxetine (CYMBALTA) 30 MG capsule Take 1 capsule (30 mg total) by mouth 2 (two) times daily.  Marland Kitchen gabapentin (NEURONTIN) 300 MG capsule TAKE 1 CAPSULE BY MOUTH EVERY MORNING AND 2 CAPSULE BY MOUTH EVERY NIGHT AT BEDTIME (Patient taking differently: Take 300-600 mg by mouth See admin instructions. Take 300 mg in the morning and 600 mg at bedtime)  . hydrocortisone 2.5 % cream Apply topically 2 (two) times daily. (Patient taking differently: Apply 1 application topically 2 (two) times daily as needed (rash). )  . HYSINGLA ER 40 MG T24A Take 40 mg by mouth at bedtime.   Marland Kitchen loratadine (CLARITIN) 10 MG tablet Take 10 mg by mouth daily as needed for allergies.   . metoprolol tartrate (LOPRESSOR) 50 MG tablet Take 0.5 tablets (25 mg total) by mouth 2 (two) times daily.  . Multiple Vitamin (MULTIVITAMIN WITH MINERALS) TABS tablet Take 1 tablet by mouth daily.  Marland Kitchen omeprazole (PRILOSEC) 20 MG capsule Take 1  capsule (20 mg total) by mouth daily. 30 days for gastroprotection while taking NSAIDs.  Marland Kitchen ondansetron (ZOFRAN) 4 MG tablet Take 1 tablet (4 mg total) by mouth every 8 (eight) hours as needed for nausea or vomiting.  Marland Kitchen oxyCODONE-acetaminophen (PERCOCET) 10-325 MG tablet Take 1 tablet by mouth 3 (three) times daily.   No facility-administered encounter medications on file as of 03/16/2019.     Activities of Daily Living In your present state of health, do you have any difficulty performing the following activities: 02/06/2019 01/29/2019  Hearing? N N  Vision? N N  Difficulty concentrating or making decisions? N N  Walking or climbing stairs? Y Y  Dressing or bathing? N N  Doing errands, shopping? N N  Some recent data might be hidden    Patient Care Team: Colon Branch, MD as PCP - General (Internal Medicine) Dian Situ, MD as Consulting Physician (Pain Medicine) Irine Seal, MD as Attending Physician (Urology) Renette Butters, MD as Attending Physician (Orthopedic Surgery)    Assessment:   This is a routine wellness examination for Sarayah. Physical assessment deferred to PCP.  Exercise Activities and Dietary recommendations   Diet (meal preparation, eat out, water intake, caffeinated beverages, dairy products, fruits and vegetables): {Desc; diets:16563} Breakfast: Lunch:  Dinner:      Goals   None     Fall Risk Fall Risk  05/12/2018 09/26/2017 09/28/2016 05/24/2016 02/11/2016  Falls in the past year? 0 No Yes Yes No  Number falls in past yr: - - 1 1 -  Injury with Fall? - - Yes Yes -  Risk Factor Category  - - -  High Fall Risk -  Risk for fall due to : - - Impaired balance/gait;Impaired mobility Impaired balance/gait;Impaired mobility -  Risk for fall due to: Comment - - - - -  Follow up - - Education provided;Falls prevention discussed Falls evaluation completed;Education provided;Falls prevention discussed -    Depression Screen PHQ 2/9 Scores 12/21/2018 09/26/2017  09/28/2016 02/11/2016  PHQ - 2 Score 0 0 1 0  PHQ- 9 Score 0 - - -     Cognitive Function Ad8 score reviewed for issues:  Issues making decisions:  Less interest in hobbies / activities:  Repeats questions, stories (family complaining):  Trouble using ordinary gadgets (microwave, computer, phone):  Forgets the month or year:   Mismanaging finances:   Remembering appts:  Daily problems with thinking and/or memory: Ad8 score is=     MMSE - Mini Mental State Exam 09/26/2017 09/28/2016  Not completed: - (No Data)  Orientation to time 5 5  Orientation to Place 5 5  Registration 3 3  Attention/ Calculation 4 1  Recall 2 0  Language- name 2 objects 2 2  Language- repeat 0 1  Language- follow 3 step command 3 3  Language- read & follow direction 1 1  Write a sentence 1 1  Copy design 1 0  Total score 27 22        Immunization History  Administered Date(s) Administered  . Fluad Quad(high Dose 65+) 01/19/2019  . Influenza, High Dose Seasonal PF 04/06/2014, 02/11/2016, 01/20/2018  . Influenza,inj,Quad PF,6+ Mos 01/22/2013, 02/08/2015  . Influenza-Unspecified 01/28/2017  . Pneumococcal Conjugate-13 02/19/2015  . Pneumococcal Polysaccharide-23 12/12/2015  . Td 02/11/2016   Screening Tests Health Maintenance  Topic Date Due  . TETANUS/TDAP  02/10/2026  . INFLUENZA VACCINE  Completed  . DEXA SCAN  Completed  . PNA vac Low Risk Adult  Completed      Plan:   ***   I have personally reviewed and noted the following in the patient's chart:   . Medical and social history . Use of alcohol, tobacco or illicit drugs  . Current medications and supplements . Functional ability and status . Nutritional status . Physical activity . Advanced directives . List of other physicians . Hospitalizations, surgeries, and ER visits in previous 12 months . Vitals . Screenings to include cognitive, depression, and falls . Referrals and appointments  In addition, I have reviewed  and discussed with patient certain preventive protocols, quality metrics, and best practice recommendations. A written personalized care plan for preventive services as well as general preventive health recommendations were provided to patient.     Avon GullyBritt, Trev Boley Angel, [place]RN  03/15/2019

## 2019-03-16 ENCOUNTER — Other Ambulatory Visit: Payer: Self-pay

## 2019-03-16 ENCOUNTER — Ambulatory Visit: Payer: Medicare Other | Admitting: *Deleted

## 2019-03-19 NOTE — Progress Notes (Signed)
Virtual Visit via Video Note  I connected with patient on 03/20/19 at  1:45 PM EST by audio enabled telemedicine application and verified that I am speaking with the correct person using two identifiers.   THIS ENCOUNTER IS A VIRTUAL VISIT DUE TO COVID-19 - PATIENT WAS NOT SEEN IN THE OFFICE. PATIENT HAS CONSENTED TO VIRTUAL VISIT / TELEMEDICINE VISIT   Location of patient: home  Location of provider: office  I discussed the limitations of evaluation and management by telemedicine and the availability of in person appointments. The patient expressed understanding and agreed to proceed.   Subjective:   Sara [name] is a 78 y.o. female who presents for Medicare Annual (Subsequent) preventive examination.  Review of Systems:  Home Safety/Smoke Alarms: Feels safe in home. Smoke alarms in place.   Lives w/ husband in 1 story home. Daughter lives close by. Grab rails in walk in shower.. Uses cane in house and walker when going out.   Female:   Mammo- declines     Dexa scan- declines CCS- declines    Objective:     Vitals: Unable to assess. This visit is enabled though telemedicine due to Covid 19.   Advanced Directives 03/20/2019 02/06/2019 01/29/2019 06/06/2018 02/14/2018 09/26/2017 09/06/2017  Does Patient Have a Medical Advance Directive? Yes Yes Yes Yes Yes Yes Yes  Type of Estate agentAdvance Directive Healthcare Power of [place]Attorney;Living will Living will Living will Healthcare Power of [place];Living will Healthcare Power of [place] Golf CourseAttorney;Living will Healthcare Power of [place];Living will Healthcare Power of Attorney  Does patient want to make changes to medical advance directive? No - Patient declined No - Patient declined No - Patient declined No - Patient declined No - Patient declined - -  Copy of Healthcare Power of Attorney in Chart? Yes - validated most recent copy scanned in chart (See row information) - - No - copy requested - Yes -  Would patient like information on creating a medical  advance directive? - - - - - - -  Pre-existing out of facility DNR order (yellow form or pink MOST form) - - - - - - -    Tobacco Social History   Tobacco Use  Smoking Status Former Smoker  . Packs/day: 0.50  . Years: 60.00  . Pack years: 30.00  . Types: Cigarettes  Smokeless Tobacco Never Used  Tobacco Comment   quit 04-2015     Counseling given: Not Answered Comment: quit 04-2015   Clinical Intake: Pain : No/denies pain     Past Medical History:  Diagnosis Date  . Anxiety   . Arthritis   . Bronchitis    hx of  . Carotid artery occlusion   . Chronic pain syndrome    pain management  . Coronary artery disease    sees Dr. Patty SermonsBrackbill  . Dementia (HCC)   . GERD (gastroesophageal reflux disease)    hx of  . H/O hiatal hernia   . Hypercholesteremia   . Hypertension    all managed by Dr. Haynes DageBrack bill  . Mitral valve regurgitation    trace  . Non-traumatic compression fracture of thoracic vertebra (HCC)    T9  . Pneumonia 05/27/2017  . S/P insertion of spinal cord stimulator    "i just charge it every couple of weeks , i dont have a remote "   . Stroke (HCC)   . Wears dentures   . Wears glasses    Past Surgical History:  Procedure Laterality Date  . BACK SURGERY  x 3 Dr Juliene Pina, last @ [facility] ~ 2005, rods, fusion, four surgery  . CARDIAC CATHETERIZATION  1994  . CAROTID ENDARTERECTOMY    . CARPAL TUNNEL RELEASE     left  . CHOLECYSTECTOMY    . ENDARTERECTOMY  04/24/2012   Procedure: ENDARTERECTOMY CAROTID;  Surgeon: Larina Earthly, MD;  Location[facility] OR;  Service: Vascular;  Laterality: Left;  . HAND SURGERY     bilateral  . HIP FRACTURE SURGERY Left   . HIP PINNING,CANNULATED Left 02/08/2015   Procedure: CANNULATED HIP PINNING;  Surgeon: Kathryne Hitch, MD;  Location: [facility] ORS;  Service: Orthopedics;  Laterality: Left;  . HIP PINNING,CANNULATED Right 03/31/2016   Procedure: CANNULATED HIP PINNING;  Surgeon: Kathryne Hitch, MD;  Location: [facility]  ORS;  Service: Orthopedics;  Laterality: Right;  . HYSTEROTOMY    . KNEE ARTHROSCOPY Right 02/14/2018   Procedure: RIGHT KNEE ARTHROSCOPY WITH POSSIBLE PARTIAL MENISCECTOMY;  Surgeon: Kathryne Hitch, MD;  Location: [facility] OR;  Service: Orthopedics;  Laterality: Right;  . MULTIPLE TOOTH EXTRACTIONS    . SPINAL CORD STIMULATOR INSERTION    . TEE WITHOUT CARDIOVERSION  04/12/2012   Procedure: TRANSESOPHAGEAL ECHOCARDIOGRAM (TEE);  Surgeon: Lewayne Bunting, MD;  Location: [facility] ENDOSCOPY;  Service: Cardiovascular;  Laterality: N/A;  . TONSILLECTOMY    . TOTAL KNEE ARTHROPLASTY Right 02/06/2019   Procedure: TOTAL KNEE ARTHROPLASTY;  Surgeon: Sheral Apley, MD;  Location: [facility] ORS;  Service: Orthopedics;  Laterality: Right;   Family History  Problem Relation Age of Onset  . Heart attack Father   . Heart disease Father        before age 84  . Heart disease Sister   . Cancer Sister   . Heart disease Sister    Social History   Socioeconomic History  . Marital status: Married    Spouse name: Not on file  . Number of children: 4  . Years of education: Not on file  . Highest education level: Not on file  Occupational History  . Occupation: retired   Engineer, production  . Financial resource strain: Not on file  . Food insecurity    Worry: Not on file    Inability: Not on file  . Transportation needs    Medical: Not on file    Non-medical: Not on file  Tobacco Use  . Smoking status: Former Smoker    Packs/day: 0.50    Years: 60.00    Pack years: 30.00    Types: Cigarettes  . Smokeless tobacco: Never Used  . Tobacco comment: quit 04-2015  Substance and Sexual Activity  . Alcohol use: No    Alcohol/week: 0.0 standard drinks  . Drug use: No  . Sexual activity: Not Currently  Lifestyle  . Physical activity    Days per week: Not on file    Minutes per session: Not on file  . Stress: Not on file  Relationships  . Social Musician on phone: Not on file    Gets  together: Not on file    Attends religious service: Not on file    Active member of club or organization: Not on file    Attends meetings of clubs or organizations: Not on file    Relationship status: Not on file  Other Topics Concern  . Not on file  Social History Narrative   Lives w/ husband in a one story home.     Does not drive    4 daughters ,  2 in [place])    Retired: Educational psychologist.   Education: high school    Outpatient Encounter Medications as of 03/20/2019  Medication Sig  . amLODipine (NORVASC) 10 MG tablet Take 1 tablet (10 mg total) by mouth daily.  Marland Kitchen aspirin EC 81 MG tablet Take 1 tablet (81 mg total) by mouth 2 (two) times daily. For DVT prophylaxis for 30 days after surgery.  Marland Kitchen atorvastatin (LIPITOR) 40 MG tablet Take 0.5 tablets (20 mg total) by mouth daily.  . diazepam (VALIUM) 5 MG tablet TAKE 1 TABLET BY MOUTH EVERY DAY AS NEEDED (Patient taking differently: Take 5 mg by mouth daily as needed for anxiety. )  . diphenhydrAMINE (BENADRYL) 25 mg capsule Take 25 mg by mouth every 6 (six) hours as needed (runny nose).  Marland Kitchen donepezil (ARICEPT) 10 MG tablet Take 1 tablet (10 mg total) by mouth at bedtime.  . DULoxetine (CYMBALTA) 30 MG capsule Take 1 capsule (30 mg total) by mouth 2 (two) times daily.  Marland Kitchen gabapentin (NEURONTIN) 300 MG capsule TAKE 1 CAPSULE BY MOUTH EVERY MORNING AND 2 CAPSULE BY MOUTH EVERY NIGHT AT BEDTIME (Patient taking differently: Take 300-600 mg by mouth See admin instructions. Take 300 mg in the morning and 600 mg at bedtime)  . hydrocortisone 2.5 % cream Apply topically 2 (two) times daily. (Patient taking differently: Apply 1 application topically 2 (two) times daily as needed (rash). )  . HYSINGLA ER 40 MG T24A Take 40 mg by mouth at bedtime.   Marland Kitchen loratadine (CLARITIN) 10 MG tablet Take 10 mg by mouth daily as needed for allergies.   . metoprolol tartrate (LOPRESSOR) 50 MG tablet Take 0.5 tablets (25 mg total) by mouth 2 (two) times daily.  . Multiple  Vitamin (MULTIVITAMIN WITH MINERALS) TABS tablet Take 1 tablet by mouth daily.  Marland Kitchen omeprazole (PRILOSEC) 20 MG capsule Take 1 capsule (20 mg total) by mouth daily. 30 days for gastroprotection while taking NSAIDs.  Marland Kitchen oxyCODONE-acetaminophen (PERCOCET) 10-325 MG tablet Take 1 tablet by mouth 3 (three) times daily.  . ondansetron (ZOFRAN) 4 MG tablet Take 1 tablet (4 mg total) by mouth every 8 (eight) hours as needed for nausea or vomiting. (Patient not taking: Reported on 03/20/2019)   No facility-administered encounter medications on file as of 03/20/2019.     Activities of Daily Living In your present state of health, do you have any difficulty performing the following activities: 03/20/2019 02/06/2019  Hearing? N N  Vision? N N  Difficulty concentrating or making decisions? Y N  Walking or climbing stairs? N Y  Dressing or bathing? N N  Doing errands, shopping? Y N  Preparing Food and eating ? N -  Using the Toilet? N -  In the past six months, have you accidently leaked urine? N -  Do you have problems with loss of bowel control? N -  Managing your Medications? N -  Managing your Finances? N -  Housekeeping or managing your Housekeeping? N -  Some recent data might be hidden    Patient Care Team: Colon Branch, MD as PCP - General (Internal Medicine) Dian Situ, MD as Consulting Physician (Pain Medicine) Irine Seal, MD as Attending Physician (Urology) Renette Butters, MD as Attending Physician (Orthopedic Surgery)    Assessment:   This is a routine wellness examination for Destinee. Physical assessment deferred to PCP.  Exercise Activities and Dietary recommendations Current Exercise Habits: Home exercise routine, Time (Minutes): 30, Frequency (Times/Week): 7, Weekly Exercise (Minutes/Week): 210,  Exercise limited by: None identified Diet (meal preparation, eat out, water intake, caffeinated beverages, dairy products, fruits and vegetables): 24 hr recall Breakfast:bacon,  eggs, hashbrowns, toast. Lunch: late breakfast. Normally skips lunch.. Dinner:  Chicken wings  Goals    . Patient Stated     Getting more active.       Fall Risk Fall Risk  03/20/2019 05/12/2018 09/26/2017 09/28/2016 05/24/2016  Falls in the past year? 0 0 No Yes Yes  Number falls in past yr: 0 - - 1 1  Injury with Fall? 0 - - Yes Yes  Risk Factor Category  - - - - High Fall Risk  Risk for fall due to : - - - Impaired balance/gait;Impaired mobility Impaired balance/gait;Impaired mobility  Risk for fall due to: Comment - - - - -  Follow up Education provided;Falls prevention discussed - - Education provided;Falls prevention discussed Falls evaluation completed;Education provided;Falls prevention discussed     Depression Screen PHQ 2/9 Scores 03/20/2019 12/21/2018 09/26/2017 09/28/2016  PHQ - 2 Score 1 0 0 1  PHQ- 9 Score - 0 - -     Cognitive Function      MMSE - Mini Mental State Exam 03/20/2019 09/26/2017 09/28/2016  Not completed: Refused - (No Data)  Orientation to time - 5 5  Orientation to Place - 5 5  Registration - 3 3  Attention/ Calculation - 4 1  Recall - 2 0  Language- name 2 objects - 2 2  Language- repeat - 0 1  Language- follow 3 step command - 3 3  Language- read & follow direction - 1 1  Write a sentence - 1 1  Copy design - 1 0  Total score - 27 22        Immunization History  Administered Date(s) Administered  . Fluad Quad(high Dose 65+) 01/19/2019  . Influenza, High Dose Seasonal PF 04/06/2014, 02/11/2016, 01/20/2018  . Influenza,inj,Quad PF,6+ Mos 01/22/2013, 02/08/2015  . Influenza-Unspecified 01/28/2017  . Pneumococcal Conjugate-13 02/19/2015  . Pneumococcal Polysaccharide-23 12/12/2015  . Td 02/11/2016    Screening Tests Health Maintenance  Topic Date Due  . TETANUS/TDAP  02/10/2026  . INFLUENZA VACCINE  Completed  . DEXA SCAN  Completed  . PNA vac Low Risk Adult  Completed       Plan:   See you next year!  Continue to eat heart  healthy diet (full of fruits, vegetables, whole grains, lean protein, water--limit salt, fat, and sugar intake) and increase physical activity as tolerated.  Continue doing brain stimulating activities (puzzles, reading, adult coloring books, staying active) to keep memory sharp.    I have personally reviewed and noted the following in the patient's chart:   . Medical and social history . Use of alcohol, tobacco or illicit drugs  . Current medications and supplements . Functional ability and status . Nutritional status . Physical activity . Advanced directives . List of other physicians . Hospitalizations, surgeries, and ER visits in previous 12 months . Vitals . Screenings to include cognitive, depression, and falls . Referrals and appointments  In addition, I have reviewed and discussed with patient certain preventive protocols, quality metrics, and best practice recommendations. A written personalized care plan for preventive services as well as general preventive health recommendations were provided to patient.     [place]on Gully, [place]  03/20/2019

## 2019-03-20 ENCOUNTER — Encounter: Payer: Self-pay | Admitting: *Deleted

## 2019-03-20 ENCOUNTER — Other Ambulatory Visit: Payer: Self-pay

## 2019-03-20 ENCOUNTER — Ambulatory Visit (INDEPENDENT_AMBULATORY_CARE_PROVIDER_SITE_OTHER): Payer: Medicare Other | Admitting: *Deleted

## 2019-03-20 DIAGNOSIS — Z0000 Encounter for general adult medical examination without abnormal findings: Secondary | ICD-10-CM

## 2019-03-20 NOTE — Patient Instructions (Signed)
See you next year!  Continue to eat heart healthy diet (full of fruits, vegetables, whole grains, lean protein, water--limit salt, fat, and sugar intake) and increase physical activity as tolerated.  Continue doing brain stimulating activities (puzzles, reading, adult coloring books, staying active) to keep memory sharp.    Sara [name] , Thank you for taking time to come for your Medicare Wellness Visit. I appreciate your ongoing commitment to your health goals. Please review the following plan we discussed and let me know if I can assist you in the future.   These are the goals we discussed: Goals    . Patient Stated     Getting more active.       This is a list of the screening recommended for you and due dates:  Health Maintenance  Topic Date Due  . Tetanus Vaccine  02/10/2026  . Flu Shot  Completed  . DEXA scan (bone density measurement)  Completed  . Pneumonia vaccines  Completed    Preventive Care 26 Years and Older, Female Preventive care refers to lifestyle choices and visits with your health care provider that can promote health and wellness. This includes:  A yearly physical exam. This is also called an annual well check.  Regular dental and eye exams.  Immunizations.  Screening for certain conditions.  Healthy lifestyle choices, such as diet and exercise. What can I expect for my preventive care visit? Physical exam Your health care provider will check:  Height and weight. These may be used to calculate body mass index (BMI), which is a measurement that tells if you are at a healthy weight.  Heart rate and blood pressure.  Your skin for abnormal spots. Counseling Your health care provider may ask you questions about:  Alcohol, tobacco, and drug use.  Emotional well-being.  Home and relationship well-being.  Sexual activity.  Eating habits.  History of falls.  Memory and ability to understand (cognition).  Work and work Statistician.  Pregnancy  and menstrual history. What immunizations do I need?  Influenza (flu) vaccine  This is recommended every year. Tetanus, diphtheria, and pertussis (Tdap) vaccine  You may need a Td booster every 10 years. Varicella (chickenpox) vaccine  You may need this vaccine if you have not already been vaccinated. Zoster (shingles) vaccine  You may need this after age 46. Pneumococcal conjugate (PCV13) vaccine  One dose is recommended after age 39. Pneumococcal polysaccharide (PPSV23) vaccine  One dose is recommended after age 32. Measles, mumps, and rubella (MMR) vaccine  You may need at least one dose of MMR if you were born in 1957 or later. You may also need a second dose. Meningococcal conjugate (MenACWY) vaccine  You may need this if you have certain conditions. Hepatitis A vaccine  You may need this if you have certain conditions or if you travel or work in places where you may be exposed to hepatitis A. Hepatitis B vaccine  You may need this if you have certain conditions or if you travel or work in places where you may be exposed to hepatitis B. Haemophilus influenzae type b (Hib) vaccine  You may need this if you have certain conditions. You may receive vaccines as individual doses or as more than one vaccine together in one shot (combination vaccines). Talk with your health care provider about the risks and benefits of combination vaccines. What tests do I need? Blood tests  Lipid and cholesterol levels. These may be checked every 5 years, or more frequently depending  on your overall health.  Hepatitis C test.  Hepatitis B test. Screening  Lung cancer screening. You may have this screening every year starting at age 51 if you have a 30-pack-year history of smoking and currently smoke or have quit within the past 15 years.  Colorectal cancer screening. All adults should have this screening starting at age 43 and continuing until age 8. Your health care provider may  recommend screening at age 11 if you are at increased risk. You will have tests every 1-10 years, depending on your results and the type of screening test.  Diabetes screening. This is done by checking your blood sugar (glucose) after you have not eaten for a while (fasting). You may have this done every 1-3 years.  Mammogram. This may be done every 1-2 years. Talk with your health care provider about how often you should have regular mammograms.  BRCA-related cancer screening. This may be done if you have a family history of breast, ovarian, tubal, or peritoneal cancers. Other tests  Sexually transmitted disease (STD) testing.  Bone density scan. This is done to screen for osteoporosis. You may have this done starting at age 64. Follow these instructions at home: Eating and drinking  Eat a diet that includes fresh fruits and vegetables, whole grains, lean protein, and low-fat dairy products. Limit your intake of foods with high amounts of sugar, saturated fats, and salt.  Take vitamin and mineral supplements as recommended by your health care provider.  Do not drink alcohol if your health care provider tells you not to drink.  If you drink alcohol: ? Limit how much you have to 0-1 drink a day. ? Be aware of how much alcohol is in your drink. In the [place] one drink equals one 12 oz bottle of beer (355 mL), one 5 oz glass of wine (148 mL), or one 1 oz glass of hard liquor (44 mL). Lifestyle  Take daily care of your teeth and gums.  Stay active. Exercise for at least 30 minutes on 5 or more days each week.  Do not use any products that contain nicotine or tobacco, such as cigarettes, e-cigarettes, and chewing tobacco. If you need help quitting, ask your health care provider.  If you are sexually active, practice safe sex. Use a condom or other form of protection in order to prevent STIs (sexually transmitted infections).  Talk with your health care provider about taking a low-dose  aspirin or statin. What's next?  Go to your health care provider once a year for a well check visit.  Ask your health care provider how often you should have your eyes and teeth checked.  Stay up to date on all vaccines. This information is not intended to replace advice given to you by your health care provider. Make sure you discuss any questions you have with your health care provider. Document Released: 05/09/2015 Document Revised: 04/06/2018 Document Reviewed: 04/06/2018 Elsevier Patient Education  2020 [employer].

## 2019-04-23 ENCOUNTER — Other Ambulatory Visit: Payer: Self-pay

## 2019-04-23 ENCOUNTER — Ambulatory Visit (INDEPENDENT_AMBULATORY_CARE_PROVIDER_SITE_OTHER): Payer: Medicare Other | Admitting: Internal Medicine

## 2019-04-23 VITALS — BP 152/72 | HR 70 | Temp 96.2°F | Resp 18 | Wt 137.4 lb

## 2019-04-23 DIAGNOSIS — N390 Urinary tract infection, site not specified: Secondary | ICD-10-CM

## 2019-04-23 DIAGNOSIS — L129 Pemphigoid, unspecified: Secondary | ICD-10-CM | POA: Diagnosis not present

## 2019-04-23 MED ORDER — BETAMETHASONE DIPROPIONATE AUG 0.05 % EX CREA: TOPICAL_CREAM | Freq: Two times a day (BID) | CUTANEOUS | 0 refills | Status: AC

## 2019-04-23 NOTE — Patient Instructions (Signed)
Go to our [facility] location and provide a urine sample  Start using the cream twice a day x 1 week  We are referring you to the dermatologist  Pemphigoid ?   Bullous Pemphigoid  Bullous pemphigoid is a skin disease that causes blisters to form. It ranges in severity and can last for a long time. The disease can come back months or years after it goes away. Bullous pemphigoid is an autoimmune disease. This means that the body's own disease-fighting system (immune system) attacks the body. What are the causes? The cause of this condition is not known. Certain medicines and conditions, such as psoriasis, lichen planus, and multiple sclerosis, have been associated with bullous pemphigoid. What increases the risk? This condition is more likely to develop in people over the age of 22. What are the signs or symptoms? This condition causes blisters to form on the skin. In mild cases, only a few small blisters form. In severe cases, many large blisters form in several areas of the body. The most common places blisters form are the groin, armpits, torso, thighs, and forearms. Some people develop blisters in the mouth. The blisters may break open, forming ulcers. Other symptoms of this condition include:  Redness.  Irritation.  Itchiness.  Bleeding gums.  Difficulty eating.  Cough.  Pain with swallowing.  Nosebleeds. How is this diagnosed? This condition may be diagnosed with a physical exam and blood tests. You may have a skin sample removed for testing (skin biopsy) to confirm diagnosis. You may work with a health care provider who specializes in skin care (dermatologist). How is this treated? This condition may be managed with medicines, such as:  Antibiotic medicines to prevent infection.  Steroid medicines to reduce inflammation. These may be applied to the skin (topical), taken by mouth (oral), or given as injections.  Medicines that reduce the activity of (suppress) the immune  system. If your mouth or lips are affected, your health care provider may recommend changing your diet while you are having symptoms. If your symptoms are severe, you may need to be treated at the hospital. Treatment at the hospital may include:  Treatment for ulcers.  IV medicines.  IV nutrition, if your mouth or lips are affected. Follow these instructions at home:  Take over-the-counter and prescription medicines only as told by your health care provider.  Keep your skin clean.  Do not scratch, pop, or drain your blisters. Doing so can lead to infection.  Cover blisters or ulcers with clean bandages until they heal. Change the bandages once a day or as often as recommended by your health care provider.  If you have blisters or ulcers in your mouth or lips: ? Try only drinking liquids or only eating soft foods to help relieve discomfort while eating. ? Avoid drinking very hot liquids.  Keep all follow-up visits as told by your health care provider. This is important. Contact a health care provider if you:  Have pain or itchiness that does not get better with medicine.  Develop redness, swelling, or pain that spreads away from your blisters or ulcers.  Have pus coming from a blister or ulcer. Get help right away if you:  Have a fever.  Become confused.  Have severe pain.  Feel unusually tired or weak.  Cannot eat or drink because of blisters, ulcers, or pain in your lips or mouth.  Cannot care for yourself because of blisters, ulcers, or pain in your hands or in the soles of your  feet. Summary  Bullous pemphigoid is a skin disease that causes blisters to form.  This an autoimmune disease, which means that the body's own disease-fighting system (immune system) attacks the body.  This condition can be treated with medicines. In some cases, you may need treatment at a hospital.  Do not scratch, pop, or drain your blisters. This information is not intended to replace  advice given to you by your health care provider. Make sure you discuss any questions you have with your health care provider. Document Released: 02/07/2007 Document Revised: 03/25/2017 Document Reviewed: 02/16/2017 Elsevier Patient Education  2020 [employer].

## 2019-04-23 NOTE — Progress Notes (Signed)
Subjective:    Patient ID: Sara [name], female    DOB: 1940/07/03, 78 y.o.   MRN: [id number]  DOS:  04/23/2019 Type of visit - description: Acute visit, here with her daughter Her main concern are blisters, she had few at the lower extremities and back. Typically they "pop" and  leave a shallow ulcer that takes months to heal.  Also, dysuria for a while, no genital rash or ulcer that she can tell.  Review of Systems Denies fever chills No nausea or vomiting No gross hematuria, some urinary frequency which is at baseline  Past Medical History:  Diagnosis Date  . Anxiety   . Arthritis   . Bronchitis    hx of  . Carotid artery occlusion   . Chronic pain syndrome    pain management  . Coronary artery disease    sees Dr. Patty Sermons  . Dementia (HCC)   . GERD (gastroesophageal reflux disease)    hx of  . H/O hiatal hernia   . Hypercholesteremia   . Hypertension    all managed by Dr. Haynes Dage bill  . Mitral valve regurgitation    trace  . Non-traumatic compression fracture of thoracic vertebra (HCC)    T9  . Pneumonia 05/27/2017  . S/P insertion of spinal cord stimulator    "i just charge it every couple of weeks , i dont have a remote "   . Stroke (HCC)   . Wears dentures   . Wears glasses     Past Surgical History:  Procedure Laterality Date  . BACK SURGERY     x 3 Dr Juliene Pina, last @ [facility] ~ 2005, rods, fusion, four surgery  . CARDIAC CATHETERIZATION  1994  . CAROTID ENDARTERECTOMY    . CARPAL TUNNEL RELEASE     left  . CHOLECYSTECTOMY    . ENDARTERECTOMY  04/24/2012   Procedure: ENDARTERECTOMY CAROTID;  Surgeon: Larina Earthly, MD;  Location: [facility] OR;  Service: Vascular;  Laterality: Left;  . HAND SURGERY     bilateral  . HIP FRACTURE SURGERY Left   . HIP PINNING,CANNULATED Left 02/08/2015   Procedure: CANNULATED HIP PINNING;  Surgeon: Kathryne Hitch, MD;  Location: [facility] ORS;  Service: Orthopedics;  Laterality: Left;  . HIP PINNING,CANNULATED Right  03/31/2016   Procedure: CANNULATED HIP PINNING;  Surgeon: Kathryne Hitch, MD;  Location: [facility] ORS;  Service: Orthopedics;  Laterality: Right;  . HYSTEROTOMY    . KNEE ARTHROSCOPY Right 02/14/2018   Procedure: RIGHT KNEE ARTHROSCOPY WITH POSSIBLE PARTIAL MENISCECTOMY;  Surgeon: Kathryne Hitch, MD;  Location: [facility] OR;  Service: Orthopedics;  Laterality: Right;  . MULTIPLE TOOTH EXTRACTIONS    . SPINAL CORD STIMULATOR INSERTION    . TEE WITHOUT CARDIOVERSION  04/12/2012   Procedure: TRANSESOPHAGEAL ECHOCARDIOGRAM (TEE);  Surgeon: Lewayne Bunting, MD;  Location: [facility] ENDOSCOPY;  Service: Cardiovascular;  Laterality: N/A;  . TONSILLECTOMY    . TOTAL KNEE ARTHROPLASTY Right 02/06/2019   Procedure: TOTAL KNEE ARTHROPLASTY;  Surgeon: Sheral Apley, MD;  Location: [facility] ORS;  Service: Orthopedics;  Laterality: Right;    Social History   Socioeconomic History  . Marital status: Married    Spouse name: Not on file  . Number of children: 4  . Years of education: Not on file  . Highest education level: Not on file  Occupational History  . Occupation: retired   Tobacco Use  . Smoking status: Former Smoker    Packs/day: 0.50    Years: 60.00  Pack years: 30.00    Types: Cigarettes  . Smokeless tobacco: Never Used  . Tobacco comment: quit 04-2015  Substance and Sexual Activity  . Alcohol use: No    Alcohol/week: 0.0 standard drinks  . Drug use: No  . Sexual activity: Not Currently  Other Topics Concern  . Not on file  Social History Narrative   Lives w/ husband in a one story home.     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   Education: high school   Social Determinants of Corporate investment banker Strain:   . Difficulty of Paying Living Expenses: Not on file  Food Insecurity:   . Worried About Programme researcher, broadcasting/film/video in the Last Year: Not on file  . Ran Out of Food in the Last Year: Not on file  Transportation Needs:   . Lack of  Transportation (Medical): Not on file  . Lack of Transportation (Non-Medical): Not on file  Physical Activity:   . Days of Exercise per Week: Not on file  . Minutes of Exercise per Session: Not on file  Stress:   . Feeling of Stress : Not on file  Social Connections:   . Frequency of Communication with Friends and Family: Not on file  . Frequency of Social Gatherings with Friends and Family: Not on file  . Attends Religious Services: Not on file  . Active Member of Clubs or Organizations: Not on file  . Attends Banker Meetings: Not on file  . Marital Status: Not on file  Intimate Partner Violence:   . Fear of Current or Ex-Partner: Not on file  . Emotionally Abused: Not on file  . Physically Abused: Not on file  . Sexually Abused: Not on file      Allergies as of 04/23/2019      Reactions   Penicillins Hives, Rash   Did it involve swelling of the face/tongue/throat, SOB, or low BP? No Did it involve sudden or severe rash/hives, skin peeling, or any reaction on the inside of your mouth or nose? No Did you need to seek medical attention at a hospital or doctor's office? No When did it last happen?20+ years If all above answers are "NO", may proceed with cephalosporin use.   Iodinated Diagnostic Agents Hives, Itching   01/11/19: Had CT arthrogram 01/09/19 and developed itching/hives after.  Needs 13-hour prep in the future.   Morphine And Related Rash   Hydrochlorothiazide Other (See Comments)   Pt does not remember   Indomethacin    Pt does not remember   Paroxetine Hcl    Pt does not remember   Pravachol Other (See Comments)   Pt does not remember   Quinine Derivatives    Pt does not remember   Wellbutrin [bupropion]    agitation   Zocor [simvastatin - High Dose] Other (See Comments)   Weakness/no energy      Medication List       Accurate as of April 23, 2019 11:59 PM. If you have any questions, ask your nurse or doctor.        amLODipine  10 MG tablet Commonly known as: NORVASC Take 1 tablet (10 mg total) by mouth daily.   aspirin EC 81 MG tablet Take 1 tablet (81 mg total) by mouth 2 (two) times daily. For DVT prophylaxis for 30 days after surgery.   atorvastatin 40 MG tablet Commonly known as: LIPITOR  Take 0.5 tablets (20 mg total) by mouth daily.   augmented betamethasone dipropionate 0.05 % cream Commonly known as: DIPROLENE-AF Apply topically 2 (two) times daily. Started by: Kathlene November, MD   diazepam 5 MG tablet Commonly known as: VALIUM TAKE 1 TABLET BY MOUTH EVERY DAY AS NEEDED What changed: reasons to take this   diphenhydrAMINE 25 mg capsule Commonly known as: BENADRYL Take 25 mg by mouth every 6 (six) hours as needed (runny nose).   donepezil 10 MG tablet Commonly known as: ARICEPT Take 1 tablet (10 mg total) by mouth at bedtime.   DULoxetine 30 MG capsule Commonly known as: CYMBALTA Take 1 capsule (30 mg total) by mouth 2 (two) times daily.   gabapentin 300 MG capsule Commonly known as: NEURONTIN TAKE 1 CAPSULE BY MOUTH EVERY MORNING AND 2 CAPSULE BY MOUTH EVERY NIGHT AT BEDTIME What changed:   how much to take  how to take this  when to take this  additional instructions   hydrocortisone 2.5 % cream Apply topically 2 (two) times daily. What changed:   how much to take  when to take this  reasons to take this   Hysingla ER 40 MG T24a Generic drug: HYDROcodone Bitartrate ER Take 40 mg by mouth at bedtime.   loratadine 10 MG tablet Commonly known as: CLARITIN Take 10 mg by mouth daily as needed for allergies.   metoprolol tartrate 50 MG tablet Commonly known as: LOPRESSOR Take 0.5 tablets (25 mg total) by mouth 2 (two) times daily.   multivitamin with minerals Tabs tablet Take 1 tablet by mouth daily.   omeprazole 20 MG capsule Commonly known as: PriLOSEC Take 1 capsule (20 mg total) by mouth daily. 30 days for gastroprotection while taking NSAIDs.   ondansetron 4 MG  tablet Commonly known as: Zofran Take 1 tablet (4 mg total) by mouth every 8 (eight) hours as needed for nausea or vomiting.   oxyCODONE-acetaminophen 10-325 MG tablet Commonly known as: PERCOCET Take 1 tablet by mouth 3 (three) times daily.           Objective:   Physical Exam BP (!) 152/72 (BP Location: Right Arm, Patient Position: Sitting, Cuff Size: Normal)   Pulse 70   Temp (!) 96.2 F (35.7 C) (Temporal)   Resp 18   Wt 137 lb 6.4 oz (62.3 kg)   SpO2 97%   BMI 22.86 kg/m  General:   Well developed, NAD, BMI noted.  HEENT:  Normocephalic . Face symmetric, atraumatic Lungs:  decrease breath sounds Normal respiratory effort, no intercostal retractions, no accessory muscle use. Heart: RRR,  no murmur.  no pretibial edema bilaterally  Abdomen:  Not distended, soft, non-tender. No rebound or rigidity.   Skin:  Has few shallow, punched out ulcers with minimal redness around it, no purulent discharge.  See pictures Neurologic:  alert & oriented X3.  Speech normal, gait appropriate for age and unassisted Psych--  Cognition and judgment appear intact.  Cooperative with normal attention span and concentration.  Behavior appropriate. No anxious or depressed appearing.         Assessment       Assessment Pre-diabetes-- a1c 5.9 2013 Neuropathy , NCS 12-2015 and 12/2018  purely sensory, axonal  polyneuropathy  HTN Hyperlipidemia Anxiety/depression, rarely uses diazepam. Self d/c ssri trial ~ 05/2018 Mild Dementia CV --CAD  --H/o Stroke 2013:non hemorrhagic infarcts scattered throughout the [place] . 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  (Declined CT 11-2018, pt will not agree to procedures  for AAA) MSK: -Osteoporosis  last DEXA -hip fx 01-2015 -Vertebral FX (s/p  v-plasty) - R Hip Fx 03-2016 -Chronic back pain , s/p spinal cord stimulator  Dr Jordan LikesSpivey   Abnormal CT abd/pelvis  2017: See full report, some of the findings>> Atherosclerotic ulcer left subclavian T 8 compression fracture Infrarenal abdominal aortic aneurysm 3.5 cm, follow-up 2 years Narrow  left renal artery , SMA occluded with pancreatic collaterals Mild mesenteric adenopathy, possibly stable, consider 2 year follow-up  R distal ureter enhancement per CT: Saw urology 07-2015, Rx a ultrasound   R adrenal adenoma Smoker , quit ~ 04-2015  Urosepsis 05-2015   PLAN Blisters: As described above, they leave very shallow ulcer.  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  Less likely ecthyma vs others Plan: Refer to dermatology, topical steroids, prescribed an antibiotic in case this is ecthyma (will wait for urine culture first). Dysuria: As described above, check a UA urine culture, treat appropriately.   This visit occurred during the SARS-CoV-2 public health emergency.  Safety protocols were in place, including screening questions prior to the visit, additional usage of staff PPE, and extensive cleaning of exam room while observing appropriate contact time as indicated for disinfecting solutions.

## 2019-04-24 ENCOUNTER — Other Ambulatory Visit (INDEPENDENT_AMBULATORY_CARE_PROVIDER_SITE_OTHER): Payer: Medicare Other

## 2019-04-24 DIAGNOSIS — N390 Urinary tract infection, site not specified: Secondary | ICD-10-CM

## 2019-04-24 LAB — URINALYSIS, ROUTINE W REFLEX MICROSCOPIC
Bilirubin Urine: NEGATIVE
Ketones, ur: NEGATIVE
Leukocytes,Ua: NEGATIVE
Nitrite: NEGATIVE
Specific Gravity, Urine: 1.01 (ref 1.000–1.030)
Total Protein, Urine: NEGATIVE
Urine Glucose: NEGATIVE
Urobilinogen, UA: 0.2 (ref 0.0–1.0)
WBC, UA: NONE SEEN (ref 0–?)
pH: 7 (ref 5.0–8.0)

## 2019-04-24 NOTE — Assessment & Plan Note (Signed)
Blisters: As described above, they leave very shallow ulcer.  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  Less likely ecthyma vs others Plan: Refer to dermatology, topical steroids, prescribed an antibiotic in case this is ecthyma (will wait for urine culture first). Dysuria: As described above, check a UA urine culture, treat appropriately.

## 2019-04-26 ENCOUNTER — Other Ambulatory Visit: Payer: Self-pay | Admitting: Internal Medicine

## 2019-04-26 LAB — URINE CULTURE
MICRO NUMBER:: 1236616
SPECIMEN QUALITY:: ADEQUATE

## 2019-04-26 MED ORDER — DOXYCYCLINE HYCLATE 100 MG PO TABS: 100.0000 mg | ORAL_TABLET | Freq: Two times a day (BID) | ORAL | 0 refills | Status: DC

## 2019-05-08 ENCOUNTER — Encounter: Payer: Self-pay | Admitting: Internal Medicine

## 2019-05-11 ENCOUNTER — Other Ambulatory Visit: Payer: Self-pay | Admitting: *Deleted

## 2019-05-11 MED ORDER — METOPROLOL TARTRATE 50 MG PO TABS: 25.0000 mg | ORAL_TABLET | Freq: Two times a day (BID) | ORAL | 1 refills | Status: AC

## 2019-05-14 ENCOUNTER — Other Ambulatory Visit: Payer: Self-pay

## 2019-05-14 ENCOUNTER — Emergency Department (HOSPITAL_BASED_OUTPATIENT_CLINIC_OR_DEPARTMENT_OTHER)
Admission: EM | Admit: 2019-05-14 | Discharge: 2019-05-14 | Disposition: A | Discharge status: Home / Self Care | Payer: Medicare Other | Attending: Emergency Medicine | Admitting: Emergency Medicine

## 2019-05-14 ENCOUNTER — Emergency Department (HOSPITAL_BASED_OUTPATIENT_CLINIC_OR_DEPARTMENT_OTHER): Payer: Medicare Other

## 2019-05-14 ENCOUNTER — Encounter (HOSPITAL_BASED_OUTPATIENT_CLINIC_OR_DEPARTMENT_OTHER): Payer: Self-pay | Admitting: *Deleted

## 2019-05-14 DIAGNOSIS — Z79899 Other long term (current) drug therapy: Secondary | ICD-10-CM | POA: Diagnosis not present

## 2019-05-14 DIAGNOSIS — Z7982 Long term (current) use of aspirin: Secondary | ICD-10-CM | POA: Insufficient documentation

## 2019-05-14 DIAGNOSIS — Z8673 Personal history of transient ischemic attack (TIA), and cerebral infarction without residual deficits: Secondary | ICD-10-CM | POA: Diagnosis not present

## 2019-05-14 DIAGNOSIS — Z87891 Personal history of nicotine dependence: Secondary | ICD-10-CM | POA: Insufficient documentation

## 2019-05-14 DIAGNOSIS — F0390 Unspecified dementia without behavioral disturbance: Secondary | ICD-10-CM | POA: Diagnosis not present

## 2019-05-14 DIAGNOSIS — I10 Essential (primary) hypertension: Secondary | ICD-10-CM | POA: Insufficient documentation

## 2019-05-14 DIAGNOSIS — I2510 Atherosclerotic heart disease of native coronary artery without angina pectoris: Secondary | ICD-10-CM | POA: Insufficient documentation

## 2019-05-14 DIAGNOSIS — Z20822 Contact with and (suspected) exposure to covid-19: Secondary | ICD-10-CM | POA: Insufficient documentation

## 2019-05-14 DIAGNOSIS — R0602 Shortness of breath: Secondary | ICD-10-CM | POA: Insufficient documentation

## 2019-05-14 DIAGNOSIS — R5383 Other fatigue: Secondary | ICD-10-CM | POA: Diagnosis present

## 2019-05-14 DIAGNOSIS — R3121 Asymptomatic microscopic hematuria: Secondary | ICD-10-CM | POA: Insufficient documentation

## 2019-05-14 DIAGNOSIS — R531 Weakness: Secondary | ICD-10-CM | POA: Diagnosis not present

## 2019-05-14 DIAGNOSIS — Z96651 Presence of right artificial knee joint: Secondary | ICD-10-CM | POA: Insufficient documentation

## 2019-05-14 LAB — COMPREHENSIVE METABOLIC PANEL
ALT: 11 U/L (ref 0–44)
AST: 15 U/L (ref 15–41)
Albumin: 3.6 g/dL (ref 3.5–5.0)
Alkaline Phosphatase: 87 U/L (ref 38–126)
Anion gap: 9 (ref 5–15)
BUN: 20 mg/dL (ref 8–23)
CO2: 25 mmol/L (ref 22–32)
Calcium: 8.8 mg/dL — ABNORMAL LOW (ref 8.9–10.3)
Chloride: 104 mmol/L (ref 98–111)
Creatinine, Ser: 1.08 mg/dL — ABNORMAL HIGH (ref 0.44–1.00)
GFR calc Af Amer: 56 mL/min — ABNORMAL LOW (ref >60)
GFR calc non Af Amer: 48 mL/min — ABNORMAL LOW (ref >60)
Glucose, Bld: 186 mg/dL — ABNORMAL HIGH (ref 70–99)
Potassium: 3.9 mmol/L (ref 3.5–5.1)
Sodium: 138 mmol/L (ref 135–145)
Total Bilirubin: 0.4 mg/dL (ref 0.3–1.2)
Total Protein: 7.4 g/dL (ref 6.5–8.1)

## 2019-05-14 LAB — CBC WITH DIFFERENTIAL/PLATELET
Abs Immature Granulocytes: 0.01 10*3/uL (ref 0.00–0.07)
Basophils Absolute: 0 10*3/uL (ref 0.0–0.1)
Basophils Relative: 0 %
Eosinophils Absolute: 0.2 10*3/uL (ref 0.0–0.5)
Eosinophils Relative: 2 %
HCT: 42.4 % (ref 36.0–46.0)
Hemoglobin: 13.5 g/dL (ref 12.0–15.0)
Immature Granulocytes: 0 %
Lymphocytes Relative: 31 %
Lymphs Abs: 2.8 10*3/uL (ref 0.7–4.0)
MCH: 30.1 pg (ref 26.0–34.0)
MCHC: 31.8 g/dL (ref 30.0–36.0)
MCV: 94.4 fL (ref 80.0–100.0)
Monocytes Absolute: 0.5 10*3/uL (ref 0.1–1.0)
Monocytes Relative: 6 %
Neutro Abs: 5.5 10*3/uL (ref 1.7–7.7)
Neutrophils Relative %: 61 %
Platelets: 288 10*3/uL (ref 150–400)
RBC: 4.49 MIL/uL (ref 3.87–5.11)
RDW: 12.6 % (ref 11.5–15.5)
WBC: 9 10*3/uL (ref 4.0–10.5)
nRBC: 0 % (ref 0.0–0.2)

## 2019-05-14 LAB — URINALYSIS, ROUTINE W REFLEX MICROSCOPIC
Bilirubin Urine: NEGATIVE
Glucose, UA: NEGATIVE mg/dL
Ketones, ur: NEGATIVE mg/dL
Leukocytes,Ua: NEGATIVE
Nitrite: NEGATIVE
Protein, ur: NEGATIVE mg/dL
Specific Gravity, Urine: 1.015 (ref 1.005–1.030)
pH: 7.5 (ref 5.0–8.0)

## 2019-05-14 LAB — URINALYSIS, MICROSCOPIC (REFLEX)

## 2019-05-14 LAB — SARS CORONAVIRUS 2 AG (30 MIN TAT): SARS Coronavirus 2 Ag: NEGATIVE

## 2019-05-14 NOTE — Discharge Instructions (Signed)
Please read and follow all provided instructions.  Your diagnoses today include:  1. Weakness   2. Asymptomatic microscopic hematuria   3. Shortness of breath     Tests performed today include:  Blood counts and electrolytes - normal white and red blood cell counts, normal kidney function, slightly high blood sugar  Urine test -shows some blood in the urine that we can see under microscope but no definite infection  Chest x-ray -no pneumonia or other problems with the lungs  Covid test -pending, will be back in 1 to 2 days  Vital signs. See below for your results today.   Medications prescribed:   None  Take any prescribed medications only as directed.  Home care instructions:  Follow any educational materials contained in this packet.  BE VERY CAREFUL not to take multiple medicines containing Tylenol (also called acetaminophen). Doing so can lead to an overdose which can damage your liver and cause liver failure and possibly death.   Follow-up instructions: Please follow-up with your primary care provider in the next 3 days for further evaluation of your symptoms.    Return instructions:   Please return to the Emergency Department if you experience worsening symptoms.   Return if you have low pulse ox readings at home, worsening shortness of breath, fevers, cough, new symptoms.  Please return if you have any other emergent concerns.  Additional Information:  Your vital signs today were: BP 133/65 (BP Location: Right Arm)   Pulse (!) 59   Temp 98.9 F (37.2 C) (Oral)   Resp 12   Ht 5\' 5"  (1.651 m)   Wt 63.5 kg   SpO2 97%   BMI 23.30 kg/m  If your blood pressure (BP) was elevated above 135/85 this visit, please have this repeated by your doctor within one month. --------------

## 2019-05-14 NOTE — ED Triage Notes (Addendum)
Per EMS:  Weakness and exertional sob x 1 week.  Pt received covid vaccine and now feels worse.  VSS on truck

## 2019-05-14 NOTE — ED Notes (Signed)
96 % while ambulating in room

## 2019-05-14 NOTE — ED Provider Notes (Signed)
[facility] EMERGENCY DEPARTMENT Provider Note   CSN: [id number] Arrival date & time: 05/14/19  1654     History Chief Complaint  Patient presents with   Fatigue   Shortness of Breath    Sara [name] is a 79 y.o. female.  Patient with history of dementia, chronic pain, knee replacement history several months ago --presents to the emergency department with generalized malaise over the past 1 to 2 weeks.  It is difficult to pinpoint exactly when she started feeling poorly.  She received the coronavirus vaccine 6 days ago and she states that she began feeling worse after this point.  She reports decreased appetite, generalized fatigue, shortness of breath with ambulation.  Typically she will ambulate with a walker.  She has had nausea but no vomiting.  No coughing or chest pain.  No diarrhea or urinary symptoms.  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  No fevers reported.  She has had some nasal congestion per her daughter at bedside.  Onset of symptoms acute.         Past Medical History:  Diagnosis Date   Anxiety    Arthritis    Bronchitis    hx of   Carotid artery occlusion    Chronic pain syndrome    pain management   Coronary artery disease    sees Dr. Patty Sermons   Dementia [facility])    GERD (gastroesophageal reflux disease)    hx of   H/O hiatal hernia    Hypercholesteremia    Hypertension    all managed by Dr. Haynes Dage bill   Mitral valve regurgitation    trace   Non-traumatic compression fracture of thoracic vertebra (HCC)    T9   Pneumonia 05/27/2017   S/P insertion of spinal cord stimulator    "i just charge it every couple of weeks , i dont have a remote "    Stroke [facility])    Wears dentures    Wears glasses     Patient Active Problem List   Diagnosis Date Noted   Primary localized osteoarthritis of knee 02/06/2019   History of CVA (cerebrovascular accident)  01/19/2019   GERD (gastroesophageal reflux disease) 01/19/2019   Primary osteoarthritis of right knee 01/19/2019   Dementia (HCC) 06/15/2018   Status post arthroscopy of right knee 02/22/2018   Acute medial meniscal tear, right, sequela    Effusion, right knee    Acute pain of right knee 01/09/2018   Community acquired pneumonia of right upper lobe of lung 07/13/2017   Generalized weakness 07/13/2017   Anxiety and depression 04/14/2016   Leukocytosis 03/31/2016   Renal insufficiency 03/31/2016   Neuropathy 02/12/2016   AAA (abdominal aortic aneurysm) without rupture (HCC) 06/22/2015   PCP NOTES >>>>>>>>>>>>>>>>>>>>>>>>>>>>>>>>>. 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   CVA (cerebral infarction) 04/11/2012   Arthritis 08/05/2010   Tobacco abuse 08/05/2010   Mitral valve regurgitation    Hypertension    Hypercholesteremia    Coronary artery  disease     Past Surgical History:  Procedure Laterality Date   BACK SURGERY     x 3 Dr Juliene PinaGeoffrey, [facility], rods, fusion, four surgery   CARDIAC CATHETERIZATION  1994   CAROTID ENDARTERECTOMY     CARPAL TUNNEL RELEASE     left   CHOLECYSTECTOMY     ENDARTERECTOMY  04/24/2012   Procedure: ENDARTERECTOMY CAROTID;  Surgeon: Larina Earthlyodd F Early, MD;  [facility];  Service: Vascular;  Laterality: Left;   HAND SURGERY     bilateral   HIP FRACTURE SURGERY Left    HIP PINNING,CANNULATED Left 02/08/2015    Procedure: CANNULATED HIP PINNING;  Surgeon: Kathryne Hitchhristopher Y Blackman, MD;  Location: [facility] ORS;  Service: Orthopedics;  Laterality: Left;   HIP PINNING,CANNULATED Right 03/31/2016   Procedure: CANNULATED HIP PINNING;  Surgeon: Kathryne Hitchhristopher Y Blackman, MD;  Location: [facility] ORS;  Service: Orthopedics;  Laterality: Right;   HYSTEROTOMY     KNEE ARTHROSCOPY Right 02/14/2018   Procedure: RIGHT KNEE ARTHROSCOPY WITH POSSIBLE PARTIAL MENISCECTOMY;  Surgeon: Kathryne HitchBlackman, Christopher Y, MD;  Location: [facility] OR;  Service: Orthopedics;  Laterality: Right;   MULTIPLE TOOTH EXTRACTIONS     SPINAL CORD STIMULATOR INSERTION     TEE WITHOUT CARDIOVERSION  04/12/2012   Procedure: TRANSESOPHAGEAL ECHOCARDIOGRAM (TEE);  Surgeon: Lewayne BuntingBrian S Crenshaw, MD;  [facility];  Service: Cardiovascular;  Laterality: N/A;   TONSILLECTOMY     TOTAL KNEE ARTHROPLASTY Right 02/06/2019   Procedure: TOTAL KNEE ARTHROPLASTY;  Surgeon: Sheral ApleyMurphy, Timothy D, MD;  Location: [facility] ORS;  Service: Orthopedics;  Laterality: Right;     OB History   No obstetric history on file.     Family History  Problem Relation Age of Onset   Heart attack Father    Heart disease Father        before age 81   Heart disease Sister    Cancer Sister    Heart disease Sister     Social History   Tobacco Use   Smoking status: Former Smoker    Packs/day: 0.50    Years: 60.00    Pack years: 30.00    Types: Cigarettes   Smokeless tobacco: Never Used   Tobacco comment: quit 04-2015  Substance Use Topics   Alcohol use: No    Alcohol/week: 0.0 standard drinks   Drug use: No    Home Medications Prior to Admission medications   Medication Sig Start Date End Date Taking? Authorizing Provider  amLODipine (NORVASC) 10 MG tablet Take 1 tablet (10 mg total) by mouth daily. 10/11/18   Wanda PlumpPaz, Jose E, MD  aspirin EC 81 MG tablet Take 1 tablet (81 mg total) by mouth 2 (two) times daily. For DVT prophylaxis for 30 days after surgery. 02/09/19    Teryl LucyLandau, Ugochi Henzler, MD  atorvastatin (LIPITOR) 40 MG tablet Take 0.5 tablets (20 mg total) by mouth daily. 12/28/18   Wanda PlumpPaz, Jose E, MD  augmented betamethasone dipropionate (DIPROLENE-AF) 0.05 % cream Apply topically 2 (two) times daily. 04/23/19   Wanda PlumpPaz, Jose E, MD  diazepam (VALIUM) 5 MG tablet TAKE 1 TABLET BY MOUTH EVERY DAY AS NEEDED Patient taking differently: Take 5 mg by mouth daily as needed for anxiety.  10/30/18   Wanda PlumpPaz, Jose E, MD  diphenhydrAMINE (BENADRYL) 25 mg capsule Take 25 mg by mouth every 6 (six) hours as needed (runny nose).    [provider]  donepezil (ARICEPT) 10 MG tablet Take 1 tablet (10 mg total) by mouth at  bedtime. 10/11/18   Wanda Plump, MD  doxycycline (VIBRA-TABS) 100 MG tablet Take 1 tablet (100 mg total) by mouth 2 (two) times daily. 04/26/19   Wanda Plump, MD  DULoxetine (CYMBALTA) 30 MG capsule Take 1 capsule (30 mg total) by mouth 2 (two) times daily. 01/29/19   Wanda Plump, MD  gabapentin (NEURONTIN) 300 MG capsule TAKE 1 CAPSULE BY MOUTH EVERY MORNING AND 2 CAPSULE BY MOUTH EVERY NIGHT AT BEDTIME Patient taking differently: Take 300-600 mg by mouth See admin instructions. Take 300 mg in the morning and 600 mg at bedtime 12/28/18   Wanda Plump, MD  hydrocortisone 2.5 % cream Apply topically 2 (two) times daily. Patient taking differently: Apply 1 application topically 2 (two) times daily as needed (rash).  12/21/18 12/21/19  Wanda Plump, MD  [facility] ER 40 MG T24A Take 40 mg by mouth at bedtime.  09/16/16   [provider]  loratadine (CLARITIN) 10 MG tablet Take 10 mg by mouth daily as needed for allergies.     [provider]  metoprolol tartrate (LOPRESSOR) 50 MG tablet Take 0.5 tablets (25 mg total) by mouth 2 (two) times daily. 05/11/19   Wanda Plump, MD  Multiple Vitamin (MULTIVITAMIN WITH MINERALS) TABS tablet Take 1 tablet by mouth daily.    [provider]  omeprazole (PRILOSEC) 20 MG capsule Take 1 capsule (20 mg total) by mouth  daily. 30 days for gastroprotection while taking NSAIDs. 02/26/19   Wanda Plump, MD  ondansetron (ZOFRAN) 4 MG tablet Take 1 tablet (4 mg total) by mouth every 8 (eight) hours as needed for nausea or vomiting. 02/09/19   Martensen, Lucretia Kern III, PA-C  oxyCODONE-acetaminophen (PERCOCET) 10-325 MG tablet Take 1 tablet by mouth 3 (three) times daily.    [provider]    Allergies    Penicillins, Iodinated diagnostic agents, Morphine and related, Hydrochlorothiazide, Indomethacin, Paroxetine hcl, Pravachol, Quinine derivatives, Wellbutrin [bupropion], and Zocor [simvastatin - high dose]  Review of Systems   Review of Systems  Constitutional: Positive for appetite change. Negative for chills and fever.  HENT: Positive for congestion. Negative for rhinorrhea and sore throat.   Eyes: Negative for redness.  Respiratory: Positive for shortness of breath. Negative for cough and chest tightness.   Cardiovascular: Negative for chest pain.  Gastrointestinal: Positive for nausea. Negative for abdominal pain, diarrhea and vomiting.  Genitourinary: Negative for dysuria.  Musculoskeletal: Positive for back pain (Chronic) and myalgias.  Skin: Negative for rash.  Neurological: Negative for headaches.    Physical Exam Updated Vital Signs BP (!) 165/69    Pulse 64    Temp 98.9 F (37.2 C) (Oral)    Resp 18    Ht 5\' 5"  (1.651 m)    Wt 63.5 kg    SpO2 93%    BMI 23.30 kg/m   Physical Exam Vitals and nursing note reviewed.  Constitutional:      Appearance: She is well-developed.  HENT:     Head: Normocephalic and atraumatic.     Mouth/Throat:     Mouth: Mucous membranes are moist.  Eyes:     General:        Right eye: No discharge.        Left eye: No discharge.     Conjunctiva/sclera: Conjunctivae normal.  Cardiovascular:     Rate and Rhythm: Normal rate and regular rhythm.     Heart sounds: Normal heart sounds.  Pulmonary:  Effort: Pulmonary effort is normal. No accessory  muscle usage or respiratory distress.     Breath sounds: Normal breath sounds. No decreased breath sounds, wheezing or rhonchi.  Abdominal:     Palpations: Abdomen is soft.     Tenderness: There is no abdominal tenderness.  Musculoskeletal:     Cervical back: Normal range of motion and neck supple.     Right lower leg: No tenderness. No edema.     Left lower leg: No tenderness. No edema.     Comments: Patient has several small blisters scattered over her lower extremities bilaterally.  No cellulitis.  Skin:    General: Skin is warm and dry.  Neurological:     General: No focal deficit present.     Mental Status: She is alert.     ED Results / Procedures / Treatments   Labs (all labs ordered are listed, but only abnormal results are displayed) Labs Reviewed  COMPREHENSIVE METABOLIC PANEL - Abnormal; Notable for the following components:      Result Value   Glucose, Bld 186 (*)    Creatinine, Ser 1.08 (*)    Calcium 8.8 (*)    GFR calc non Af Amer 48 (*)    GFR calc Af Amer 56 (*)    All other components within normal limits  URINALYSIS, ROUTINE W REFLEX MICROSCOPIC - Abnormal; Notable for the following components:   Hgb urine dipstick MODERATE (*)    All other components within normal limits  URINALYSIS, MICROSCOPIC (REFLEX) - Abnormal; Notable for the following components:   Bacteria, UA RARE (*)    All other components within normal limits  SARS CORONAVIRUS 2 AG (30 MIN TAT)  URINE CULTURE  SARS CORONAVIRUS 2 (TAT 6-24 HRS)  CBC WITH DIFFERENTIAL/PLATELET    EKG EKG Interpretation  Date/Time:  Monday May 14 2019 17:37:03 EST Ventricular Rate:  61 PR Interval:    QRS Duration: 96 QT Interval:  471 QTC Calculation: 475 R Axis:   54 Text Interpretation: Sinus rhythm Borderline low voltage, extremity leads Baseline wander in lead(s) I aVR aVL Confirmed by Kennis Carina [phone]) on 05/14/2019 8:13:58 PM   Radiology DG Chest Port 1 View  Result Date:  05/14/2019 CLINICAL DATA:  Weakness, shortness of breath. EXAM: PORTABLE CHEST 1 VIEW COMPARISON:  Chest radiograph 02/07/2019, nuclear medicine V/Q scan 02/08/2019 FINDINGS: The patient is rotated to the right. Unchanged cardiomegaly. Redemonstrated aortic atherosclerosis with tortuosity of the thoracic aorta. No evidence of airspace consolidation. No pleural effusion or pneumothorax. No acute bony abnormality. Evidence of prior vertebral augmentation of a midthoracic vertebra. Spinal stimulator leads. Cardiac monitoring leads. IMPRESSION: No evidence of acute cardiopulmonary abnormality. Unchanged cardiomegaly.  Aortic atherosclerosis. Electronically Signed   By: Jackey Loge DO   On: 05/14/2019 18:05    Procedures Procedures (including critical care time)  Medications Ordered in ED Medications - No data to display    ED Course  I have reviewed the triage vital signs and the nursing notes.  Pertinent labs & imaging results that were available during my care of the patient were reviewed by me and considered in my medical decision making (see chart for details).      Patient seen and examined.  Testing ordered.  Daughter at bedside to assist with history.  Patient is saturating between 92 and 98% on room air while at bedside.  She does not appear to be in any distress and does not have any increased work of breathing.  She appears  generally weak and deconditioned.  Vital signs reviewed and are as follows: BP (!) 165/69    Pulse 64    Temp 98.9 F (37.2 C) (Oral)    Resp 18    Ht 5\' 5"  (1.651 m)    Wt 63.5 kg    SpO2 93%    BMI 23.30 kg/m   9:08 PM patient discussed with and previously seen by Dr. Sedonia Small.   Lab work-up is very reassuring.  Patient is ambulated with oxygen saturation of 96%.  She continues to look overall weak, but stable for discharge home.  When mentioning this, patient states that there is no way she would stay in the hospital anyway.  Daughter is very involved in the  patient's care and patient's husband will be with with her at home.  We discussed pending Covid.  They have a pulse oximeter at home and can do spot checks and monitor for low readings.  I strongly encouraged PCP follow-up this week for recheck.  Daughter and patient are comfortable discharged home at this time.  We discussed signs and symptoms to return including worsening weakness, inability to walk, fevers, persistent vomiting, new symptoms or other concerns.  Discussed hematuria and need for PCP follow-up.   MDM Rules/Calculators/A&P                      Patient with generalized weakness ongoing over the past several weeks but worse since getting her Covid vaccine.  Symptoms have persisted longer than I would anticipate for atypical vaccine reaction.  Work-up today included CBC, chemistry, urine test, chest x-ray, Covid test, EKG.  Fortunately these are reassuring.  Patient feels short of breath however she is maintaining a normal pulse oximeter level on room air.  She has ambulated and maintained her oxygen level at 96%.  At this point, there is no indication for admission however she will require close follow-up from her PCP to ensure improvement in symptoms or be seen for further work-up.  We discussed signs and symptoms to return as above.  Patient had knee surgery several months ago however she does not have any clinical signs symptoms of DVT, tachycardia, hypoxia to suggest PE.  She is not having any chest pain.  No lightheadedness or syncope.  Final Clinical Impression(s) / ED Diagnoses Final diagnoses:  Weakness  Asymptomatic microscopic hematuria  Shortness of breath    Rx / DC Orders ED Discharge Orders    None       Carlisle Cater, Hershal Coria 05/14/19 2112    Maudie Flakes, MD 05/15/19 [phone]

## 2019-05-14 NOTE — ED Triage Notes (Signed)
Decreased appetite, fatigue, weakness, shortness of breath x 1 week  Had covid vaccine 2-3 days after starting to feel bad  And has been worse since then

## 2019-05-15 ENCOUNTER — Telehealth: Payer: Self-pay | Admitting: Internal Medicine

## 2019-05-15 LAB — SARS CORONAVIRUS 2 (TAT 6-24 HRS): SARS Coronavirus 2: NEGATIVE

## 2019-05-15 NOTE — Telephone Encounter (Signed)
  Was seen in the ER with fatigue, shortness of breath, Covid test was negative.  Please arrange a follow-up for Thursday 05/17/2019 in the morning.  Virtual visit is okay.  If she is feeling much worse let me know.

## 2019-05-16 LAB — URINE CULTURE: Culture: 50000 — AB

## 2019-05-16 NOTE — Telephone Encounter (Signed)
Pt scheduled for visit tomorrow.

## 2019-05-17 ENCOUNTER — Ambulatory Visit (INDEPENDENT_AMBULATORY_CARE_PROVIDER_SITE_OTHER): Payer: Medicare Other | Admitting: Internal Medicine

## 2019-05-17 ENCOUNTER — Telehealth: Payer: Self-pay | Admitting: Internal Medicine

## 2019-05-17 ENCOUNTER — Other Ambulatory Visit: Payer: Self-pay

## 2019-05-17 ENCOUNTER — Telehealth: Payer: Self-pay | Admitting: *Deleted

## 2019-05-17 DIAGNOSIS — R5383 Other fatigue: Secondary | ICD-10-CM | POA: Diagnosis not present

## 2019-05-17 DIAGNOSIS — K219 Gastro-esophageal reflux disease without esophagitis: Secondary | ICD-10-CM

## 2019-05-17 DIAGNOSIS — N390 Urinary tract infection, site not specified: Secondary | ICD-10-CM | POA: Diagnosis not present

## 2019-05-17 MED ORDER — SULFAMETHOXAZOLE-TRIMETHOPRIM 800-160 MG PO TABS: 1.0000 | ORAL_TABLET | Freq: Two times a day (BID) | ORAL | 0 refills | Status: DC

## 2019-05-17 NOTE — Telephone Encounter (Signed)
Post ED Visit - Positive Culture Follow-up  Culture report reviewed by antimicrobial stewardship pharmacist: [facility] []  , Pharm.D. []  Enzo Bi, Pharm.D., BCPS AQ-ID []  , Pharm.D., BCPS []  Celedonio Miyamoto, Pharm.D., BCPS []  [place], Garvin Fila.D., BCPS, AAHIVP []  , Pharm.D., BCPS, AAHIVP []  Georgina Pillion, PharmD, BCPS []  , PharmD, BCPS []  [place], PharmD, BCPS []  [address], PharmD []  , PharmD, BCPS []  Estella Husk, PharmD .  [facility] []  , PharmD []  Phillips Climes, PharmD []  , PharmD []  Agapito Games, Rph []  ) Verlan Friends, PharmD []  , PharmD []  Mervyn Gay, PharmD []  , PharmD []  Vinnie Level, PharmD []  Cheri Guppy, PharmD []  Retta Diones, PharmD []  , PharmD []  Len Childs, PharmD   Positive urine culture No bacteriuria, no symptoms and no further patient follow-up is required at this time.  [facility] 05/17/2019, 9:48 AM

## 2019-05-17 NOTE — Telephone Encounter (Signed)
Please call the patient's daughter and advise her about all the recommendations I provided today during the virtual visit.  I just like to be sure we are all in the same page

## 2019-05-17 NOTE — Telephone Encounter (Signed)
Spoke w/ [name]- Pt's daughter, informed of recommendations. [name] verbalized understanding.

## 2019-05-17 NOTE — Progress Notes (Signed)
Subjective:    Patient ID: Sara [name], female    DOB: Dec 14, 1940, 79 y.o.   MRN: [id number]  DOS:  05/17/2019 Type of visit - description: Attempted  to make this a video visit, due to technical difficulties from the patient side it was not possible  thus we proceeded with a Virtual Visit via Telephone    I connected with above mentioned patient  by telephone and verified that I am speaking with the correct person using two identifiers.  THIS ENCOUNTER IS A VIRTUAL VISIT DUE TO COVID-19 - PATIENT WAS NOT SEEN IN THE OFFICE. PATIENT HAS CONSENTED TO VIRTUAL VISIT / TELEMEDICINE VISIT   Location of patient: home  Location of provider: office  I discussed the limitations, risks, security and privacy concerns of performing an evaluation and management service by telephone and the availability of in person appointments. I also discussed with the patient that there may be a patient responsible charge related to this service. The patient expressed understanding and agreed to proceed.   History of Present Illness: ED follow-up The patient went to the ED 05/14/2019, he c/o  malaise for 1 or 2 weeks.  Also had decreased appetite and DOE. 6 days prior to the ER visit she had a Covid vaccination. After the vaccination symptoms increased.  Urine culture show Proteus, CMP show a creatinine of 1.0, calcium 8.8 slightly low.  Covid testing negative Chest x-ray no acute    Review of Systems Since the ER visit, she is at home and feels "a little better". Reports that the DOE has decreased, he remains weak. Appetite is increasing a little. Reports a strong urine odor.  Denies fever chills No chest pain or lower extremity edema No dysuria, gross hematuria or difficulty urinating. Reports a lot of heartburn at night.  No chest pain per se.  Past Medical History:  Diagnosis Date  . Anxiety   . Arthritis   . Bronchitis    hx of  . Carotid artery occlusion   . Chronic pain syndrome    pain  management  . Coronary artery disease    sees Dr. Mare Ferrari  . Dementia ([place])   . GERD (gastroesophageal reflux disease)    hx of  . H/O hiatal hernia   . Hypercholesteremia   . Hypertension    all managed by Dr. Guss Bunde bill  . Mitral valve regurgitation    trace  . Non-traumatic compression fracture of thoracic vertebra (HCC)    T9  . Pneumonia 05/27/2017  . S/P insertion of spinal cord stimulator    "i just charge it every couple of weeks , i dont have a remote "   . Stroke ([place])   . Wears dentures   . Wears glasses     Past Surgical History:  Procedure Laterality Date  . BACK SURGERY     x 3 Dr Cay Schillings, last @ [facility] ~ 2005, rods, fusion, four surgery  . CARDIAC CATHETERIZATION  1994  . CAROTID ENDARTERECTOMY    . CARPAL TUNNEL RELEASE     left  . CHOLECYSTECTOMY    . ENDARTERECTOMY  04/24/2012   Procedure: ENDARTERECTOMY CAROTID;  Surgeon: Rosetta Posner, MD;  Location: [place];  Service: Vascular;  Laterality: Left;  . HAND SURGERY     bilateral  . HIP FRACTURE SURGERY Left   . HIP PINNING,CANNULATED Left 02/08/2015   Procedure: CANNULATED HIP PINNING;  Surgeon: Mcarthur Rossetti, MD;  Location: [facility];  Service: Orthopedics;  Laterality:  Left;  . HIP PINNING,CANNULATED Right 03/31/2016   Procedure: CANNULATED HIP PINNING;  Surgeon: Kathryne Hitch, MD;  Location: [facility];  Service: Orthopedics;  Laterality: Right;  . HYSTEROTOMY    . KNEE ARTHROSCOPY Right 02/14/2018   Procedure: RIGHT KNEE ARTHROSCOPY WITH POSSIBLE PARTIAL MENISCECTOMY;  Surgeon: Kathryne Hitch, MD;  Location[facility];  Service: Orthopedics;  Laterality: Right;  . MULTIPLE TOOTH EXTRACTIONS    . SPINAL CORD STIMULATOR INSERTION    . TEE WITHOUT CARDIOVERSION  04/12/2012   Procedure: TRANSESOPHAGEAL ECHOCARDIOGRAM (TEE);  Surgeon: Lewayne Bunting, MD;  Location: [facility];  Service: Cardiovascular;  Laterality: N/A;  . TONSILLECTOMY    . TOTAL KNEE ARTHROPLASTY Right 02/06/2019     Procedure: TOTAL KNEE ARTHROPLASTY;  Surgeon: Sheral Apley, MD;  [facility];  Service: Orthopedics;  Laterality: Right;        Objective:   Physical Exam There were no vitals taken for this visit. This is a virtual telephone visit, she is alert oriented x3,  did not recall all her medications but she was not confused or in any distress.     Assessment        Assessment Pre-diabetes-- a1c 5.9 2013 Neuropathy , NCS 12-2015 and 12/2018  purely sensory, axonal  polyneuropathy  HTN Hyperlipidemia Anxiety/depression, rarely uses diazepam. Self d/c ssri trial ~ 05/2018 Mild Dementia CV --CAD  --H/o Stroke 2013:non hemorrhagic infarcts scattered throughout the [place] Hemisphere . 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  (Declined CT 11-2018, pt will not agree to procedures  for AAA) MSK: -Osteoporosis  last DEXA -hip fx 01-2015 -Vertebral FX (s/p v-plasty) - R Hip Fx 03-2016 -Chronic back pain , s/p spinal cord stimulator  Dr Jordan Likes   Abnormal CT abd/pelvis  2017: See full report, some of the findings>> Atherosclerotic ulcer left subclavian T 8 compression fracture Infrarenal abdominal aortic aneurysm 3.5 cm, follow-up 2 years Narrow  left renal artery , SMA occluded with pancreatic collaterals Mild mesenteric adenopathy, possibly stable, consider 2 year follow-up  R distal ureter enhancement per CT: Saw urology 07-2015, Rx a ultrasound   R adrenal adenoma Smoker , quit ~ 04-2015  Urosepsis 05-2015   PLAN Fatigue, UTI. The patient went to the ER with several complaints, see HPI.  Work-up negative except for a + urine culture. The patient is somewhat better with no fever or chills. Unclear if symptoms were trigger by recent COVID-19 vaccination (the patient was already feeling poorly before  the shot but worse since the vaccine) or related to the UTI. We agreed on the following: Continue resting, increase fluids. Start Bactrim DS for 1 week For GERD: Increase Prilosec to 2 tablets in the morning on empty stomach Call if not gradually better UTI: As above GERD: As above Next Covid vaccine?  Unclear if she had a reaction to the first injection, I recommend to postpone it the second injection for 1 week and proceed if she feels well.  If she is not much better then she will let me know. I will asked my nurse to call and discuss all of the above with the daughter.   I discussed the assessment and treatment plan with the patient. The patient was provided an opportunity to ask questions and all were answered. The patient agreed with the  plan and demonstrated an understanding of the instructions.   The patient was advised to call back or seek an in-person evaluation if the symptoms worsen or if the condition fails to improve as anticipated.  I provided  32 minutes of non-face-to-face time during this encounter.  [name], MD

## 2019-05-18 NOTE — Assessment & Plan Note (Signed)
Fatigue, UTI. The patient went to the ER with several complaints, see HPI.  Work-up negative except for a + urine culture. The patient is somewhat better with no fever or chills. Unclear if symptoms were trigger by recent COVID-19 vaccination (the patient was already feeling poorly before the shot but worse since the vaccine) or related to the UTI. We agreed on the following: Continue resting, increase fluids. Start Bactrim DS for 1 week For GERD: Increase Prilosec to 2 tablets in the morning on empty stomach Call if not gradually better UTI: As above GERD: As above Next Covid vaccine?  Unclear if she had a reaction to the first injection, I recommend to postpone it the second injection for 1 week and proceed if she feels well.  If she is not much better then she will let me know. I will asked my nurse to call and discuss all of the above with the daughter.

## 2019-05-21 ENCOUNTER — Telehealth: Payer: Self-pay | Admitting: Internal Medicine

## 2019-05-21 NOTE — Telephone Encounter (Signed)
Diazepam refill.   Last OV: 05/17/2019 Last Fill: 10/30/2018 #30 and 1RF Pt sig: 1 tab daily prn UDS: None

## 2019-05-21 NOTE — Telephone Encounter (Signed)
Sent!

## 2019-05-22 ENCOUNTER — Ambulatory Visit: Payer: Medicare Other | Admitting: Internal Medicine

## 2019-06-04 ENCOUNTER — Other Ambulatory Visit: Payer: Self-pay | Admitting: Internal Medicine

## 2019-07-03 ENCOUNTER — Encounter: Payer: Self-pay | Admitting: Internal Medicine

## 2019-07-03 ENCOUNTER — Ambulatory Visit (INDEPENDENT_AMBULATORY_CARE_PROVIDER_SITE_OTHER): Payer: Medicare Other | Admitting: Internal Medicine

## 2019-07-03 ENCOUNTER — Other Ambulatory Visit: Payer: Self-pay

## 2019-07-03 VITALS — BP 121/59 | HR 70 | Temp 95.9°F | Resp 16 | Ht 65.0 in | Wt 133.2 lb

## 2019-07-03 DIAGNOSIS — F32A Depression, unspecified: Secondary | ICD-10-CM

## 2019-07-03 DIAGNOSIS — R739 Hyperglycemia, unspecified: Secondary | ICD-10-CM

## 2019-07-03 DIAGNOSIS — R5383 Other fatigue: Secondary | ICD-10-CM | POA: Diagnosis not present

## 2019-07-03 DIAGNOSIS — N390 Urinary tract infection, site not specified: Secondary | ICD-10-CM

## 2019-07-03 DIAGNOSIS — K219 Gastro-esophageal reflux disease without esophagitis: Secondary | ICD-10-CM

## 2019-07-03 DIAGNOSIS — F419 Anxiety disorder, unspecified: Secondary | ICD-10-CM

## 2019-07-03 DIAGNOSIS — F329 Major depressive disorder, single episode, unspecified: Secondary | ICD-10-CM

## 2019-07-03 NOTE — Patient Instructions (Addendum)
Go to the front, please arrange for  Labs this week, no need to fast  Office visit with me in 3 months

## 2019-07-03 NOTE — Progress Notes (Signed)
Pre visit review using our clinic review tool, if applicable. No additional management support is needed unless otherwise documented below in the visit note. 

## 2019-07-03 NOTE — Progress Notes (Signed)
Subjective:    Patient ID: Sara [name], female    DOB: 1940/05/19, 79 y.o.   MRN: [id number]  DOS:  07/03/2019 Type of visit - description: Follow-up, here with her husband At the last visit she complained of fatigue, GERD and LUTS. Took Bactrim, PPI dose increased.  Fatigue remains the same.  L UTS decreased after antibiotics but SX resurfaced a week ago: Some urinary frequency and urgency, some "odor" of the urine. She has back pain but she thinks is probably more of a MSK issue than anything else.  Review of Systems Denies fever chills.  No headache.  No unusual myalgias. Appetite is decreased, denies postprandial abdominal pain. No chest pain or palpitations Not really short of breath just tired. No nausea, vomiting, diarrhea. No depressed or anxious. Denies any dysuria, gross hematuria.  No vaginal discharge or rash.  Past Medical History:  Diagnosis Date  . Anxiety   . Arthritis   . Bronchitis    hx of  . Carotid artery occlusion   . Chronic pain syndrome    pain management  . Coronary artery disease    sees Dr. Patty Sermons  . Dementia (H[facility])   . GERD (gastroesophageal reflux disease)    hx of  . H/O hiatal hernia   . Hypercholesteremia   . Hypertension    all managed by Dr. Haynes Dage bill  . Mitral valve regurgitation    trace  . Non-traumatic compression fracture of thoracic vertebra (HCC)    T9  . Pneumonia 05/27/2017  . S/P insertion of spinal cord stimulator    "i just charge it every couple of weeks , i dont have a remote "   . Stroke ([facility])   . Wears dentures   . Wears glasses     Past Surgical History:  Procedure Laterality Date  . BACK SURGERY     x 3 Dr Juliene Pina, last @ [facility] ~ 2005, rods, fusion, four surgery  . CARDIAC CATHETERIZATION  1994  . CAROTID ENDARTERECTOMY    . CARPAL TUNNEL RELEASE     left  . CHOLECYSTECTOMY    . ENDARTERECTOMY  04/24/2012   Procedure: ENDARTERECTOMY CAROTID;  Surgeon: Larina Earthly, MD;  Location: [facility] OR;   Service: Vascular;  Laterality: Left;  . HAND SURGERY     bilateral  . HIP FRACTURE SURGERY Left   . HIP PINNING,CANNULATED Left 02/08/2015   Procedure: CANNULATED HIP PINNING;  Surgeon: Kathryne Hitch, MD;  [facility];  Service: Orthopedics;  Laterality: Left;  . HIP PINNING,CANNULATED Right 03/31/2016   Procedure: CANNULATED HIP PINNING;  Surgeon: Kathryne Hitch, MD;  [facility];  Service: Orthopedics;  Laterality: Right;  . HYSTEROTOMY    . KNEE ARTHROSCOPY Right 02/14/2018   Procedure: RIGHT KNEE ARTHROSCOPY WITH POSSIBLE PARTIAL MENISCECTOMY;  Surgeon: Kathryne Hitch, MD;  [facility];  Service: Orthopedics;  Laterality: Right;  . MULTIPLE TOOTH EXTRACTIONS    . SPINAL CORD STIMULATOR INSERTION    . TEE WITHOUT CARDIOVERSION  04/12/2012   Procedure: TRANSESOPHAGEAL ECHOCARDIOGRAM (TEE);  Surgeon: Lewayne Bunting, MD;  Location: [facility] ENDOSCOPY;  Service: Cardiovascular;  Laterality: N/A;  . TONSILLECTOMY    . TOTAL KNEE ARTHROPLASTY Right 02/06/2019   Procedure: TOTAL KNEE ARTHROPLASTY;  Surgeon: Sheral Apley, MD;  [facility];  Service: Orthopedics;  Laterality: Right;    Allergies as of 07/03/2019      Reactions   Penicillins Hives, Rash   Did it involve swelling  of the face/tongue/throat, SOB, or low BP? No Did it involve sudden or severe rash/hives, skin peeling, or any reaction on the inside of your mouth or nose? No Did you need to seek medical attention at a hospital or doctor's office? No When did it last happen?20+ years If all above answers are "NO", may proceed with cephalosporin use.   Iodinated Diagnostic Agents Hives, Itching   01/11/19: Had CT arthrogram 01/09/19 and developed itching/hives after.  Needs 13-hour prep in the future.   Morphine And Related Rash   Hydrochlorothiazide Other (See Comments)   Pt does not remember   Indomethacin    Pt does not remember   Paroxetine Hcl    Pt does not remember    Pravachol Other (See Comments)   Pt does not remember   Quinine Derivatives    Pt does not remember   Wellbutrin [bupropion]    agitation   Zocor [simvastatin - High Dose] Other (See Comments)   Weakness/no energy      Medication List       Accurate as of July 03, 2019 11:59 PM. If you have any questions, ask your nurse or doctor.        STOP taking these medications   sulfamethoxazole-trimethoprim 800-160 MG tablet Commonly known as: BACTRIM DS Stopped by: Kathlene November, MD     TAKE these medications   amLODipine 10 MG tablet Commonly known as: NORVASC Take 1 tablet (10 mg total) by mouth daily.   aspirin EC 81 MG tablet Take 1 tablet (81 mg total) by mouth 2 (two) times daily. For DVT prophylaxis for 30 days after surgery.   atorvastatin 40 MG tablet Commonly known as: LIPITOR Take 0.5 tablets (20 mg total) by mouth daily.   augmented betamethasone dipropionate 0.05 % cream Commonly known as: DIPROLENE-AF Apply topically 2 (two) times daily.   diazepam 5 MG tablet Commonly known as: VALIUM TAKE ONE TABLET BY MOUTH DAILY AS NEEDED   diphenhydrAMINE 25 mg capsule Commonly known as: BENADRYL Take 25 mg by mouth every 6 (six) hours as needed (runny nose).   donepezil 10 MG tablet Commonly known as: ARICEPT Take 1 tablet (10 mg total) by mouth at bedtime.   DULoxetine 30 MG capsule Commonly known as: CYMBALTA Take 1 capsule (30 mg total) by mouth 2 (two) times daily.   gabapentin 300 MG capsule Commonly known as: NEURONTIN TAKE 1 CAPSULE BY MOUTH EVERY MORNING AND 2 CAPSULE BY MOUTH EVERY NIGHT AT BEDTIME What changed:   how much to take  how to take this  when to take this  additional instructions   hydrocortisone 2.5 % cream Apply topically 2 (two) times daily. What changed:   how much to take  when to take this  reasons to take this   Hysingla ER 40 MG T24a Generic drug: HYDROcodone Bitartrate ER Take 40 mg by mouth at bedtime.   loratadine  10 MG tablet Commonly known as: CLARITIN Take 10 mg by mouth daily as needed for allergies.   metoprolol tartrate 50 MG tablet Commonly known as: LOPRESSOR Take 0.5 tablets (25 mg total) by mouth 2 (two) times daily.   multivitamin with minerals Tabs tablet Take 1 tablet by mouth daily.   omeprazole 20 MG capsule Commonly known as: PriLOSEC Take 1 capsule (20 mg total) by mouth daily. 30 days for gastroprotection while taking NSAIDs.   ondansetron 4 MG tablet Commonly known as: Zofran Take 1 tablet (4 mg total) by mouth every 8 (  eight) hours as needed for nausea or vomiting.   oxyCODONE-acetaminophen 10-325 MG tablet Commonly known as: PERCOCET Take 1 tablet by mouth 3 (three) times daily.          Objective:   Physical Exam BP (!) 121/59 (BP Location: Left Arm, Patient Position: Sitting, Cuff Size: Small)   Pulse 70   Temp (!) 95.9 F (35.5 C) (Temporal)   Resp 16   Ht 5\' 5"  (1.651 m)   Wt 133 lb 4 oz (60.4 kg)   SpO2 98%   BMI 22.17 kg/m  General:   Well developed, elderly lady, looks tired but not toxic.  Walks very slow with the help of a walker HEENT:  Normocephalic . Face symmetric, atraumatic Lungs:  CTA B Normal respiratory effort, no intercostal retractions, no accessory muscle use. Heart: RRR,  no murmur.  Abdomen:  Not distended, soft, non-tender. No rebound or rigidity.   Skin: Not pale. Not jaundice Lower extremities: no pretibial edema bilaterally  Neurologic:  alert & oriented X3.  Speech normal Psych--  Cognition and judgment appear intact.  Cooperative with normal attention span and concentration.  Behavior appropriate. No anxious or depressed appearing.     Assessment    Assessment Pre-diabetes-- a1c 5.9 2013 Neuropathy , NCS 12-2015 and 12/2018  purely sensory, axonal  polyneuropathy  HTN Hyperlipidemia Anxiety/depression, rarely uses diazepam. Self d/c ssri trial ~ 05/2018 Mild Dementia CV --CAD  --H/o Stroke 2013:non  hemorrhagic infarcts scattered throughout the L Hemisphere . 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  (Declined CT 11-2018, pt will not agree to procedures  for AAA) MSK: -Osteoporosis  last DEXA -hip fx 01-2015 -Vertebral FX (s/p v-plasty) - R Hip Fx 03-2016 -Chronic back pain , s/p spinal cord stimulator  Dr 04-2016   Abnormal CT abd/pelvis  2017: See full report, some of the findings>> Atherosclerotic ulcer left subclavian T 8 compression fracture Infrarenal abdominal aortic aneurysm 3.5 cm, follow-up 2 years Narrow  left renal artery , SMA occluded with pancreatic collaterals Mild mesenteric adenopathy, possibly stable, consider 2 year follow-up  R distal ureter enhancement per CT: Saw urology 07-2015, Rx a ultrasound   R adrenal adenoma Smoker , quit ~ 04-2015  Urosepsis 05-2015   PLAN Fatigue: Ongoing, review of systems is benign, most recent labs show no anemia, normal kidney function.  No anxiety or depression.  Likely multifactorial, I wonder if  home PT would help however she had a TKR in October and had PT then, declined a referral. Plan: For completeness we'll check a B12, folic acid, TSH, A1c. UTI: Was treated with Bactrim the last time, she improved the symptoms resurface. She has a history of urosepsis in 2017, right distal ureter enhancement per CT the same year.  Will check a UA urine culture.  Strongly consider referral to urology. GERD: Better with increase OTC PPIs. Anxiety depression: Request RF on Valium but he still has a refill  RTC for labs this week RTC office visit 3 months   This visit occurred during the SARS-CoV-2 public health emergency.  Safety protocols were in place, including screening questions prior to the visit, additional usage of staff PPE, and extensive cleaning of  exam room while observing appropriate contact time as indicated for disinfecting solutions.

## 2019-07-04 ENCOUNTER — Other Ambulatory Visit (INDEPENDENT_AMBULATORY_CARE_PROVIDER_SITE_OTHER): Payer: Medicare Other

## 2019-07-04 DIAGNOSIS — N390 Urinary tract infection, site not specified: Secondary | ICD-10-CM

## 2019-07-04 DIAGNOSIS — R5383 Other fatigue: Secondary | ICD-10-CM | POA: Diagnosis not present

## 2019-07-04 DIAGNOSIS — R739 Hyperglycemia, unspecified: Secondary | ICD-10-CM

## 2019-07-04 LAB — B12 AND FOLATE PANEL
Folate: >23.7 ng/mL (ref >5.9)
Vitamin B-12: 238 pg/mL (ref 211–911)

## 2019-07-04 LAB — TSH: TSH: 1.56 u[IU]/mL (ref 0.35–4.50)

## 2019-07-04 LAB — HEMOGLOBIN A1C: Hgb A1c MFr Bld: 6.1 % (ref 4.6–6.5)

## 2019-07-04 NOTE — Assessment & Plan Note (Signed)
Fatigue: Ongoing, review of systems is benign, most recent labs show no anemia, normal kidney function.  No anxiety or depression.  Likely multifactorial, I wonder if  home PT would help however she had a TKR in October and had PT then, declined a referral. Plan: For completeness we'll check a B12, folic acid, TSH, A1c. UTI: Was treated with Bactrim the last time, she improved the symptoms resurface. She has a history of urosepsis in 2017, right distal ureter enhancement per CT the same year.  Will check a UA urine culture.  Strongly consider referral to urology. GERD: Better with increase OTC PPIs. Anxiety depression: Request RF on Valium but he still has a refill  RTC for labs this week RTC office visit 3 months

## 2019-07-05 ENCOUNTER — Other Ambulatory Visit: Payer: Medicare Other

## 2019-07-05 LAB — URINALYSIS, ROUTINE W REFLEX MICROSCOPIC
Bilirubin Urine: NEGATIVE
Ketones, ur: NEGATIVE
Leukocytes,Ua: NEGATIVE
Nitrite: NEGATIVE
Specific Gravity, Urine: 1.015 (ref 1.000–1.030)
Total Protein, Urine: NEGATIVE
Urine Glucose: NEGATIVE
Urobilinogen, UA: 0.2 (ref 0.0–1.0)
pH: 7 (ref 5.0–8.0)

## 2019-07-06 ENCOUNTER — Telehealth: Payer: Self-pay | Admitting: Internal Medicine

## 2019-07-06 MED ORDER — DIAZEPAM 5 MG PO TABS: 5.0000 mg | ORAL_TABLET | Freq: Every day | ORAL | 0 refills | Status: AC | PRN

## 2019-07-06 NOTE — Telephone Encounter (Signed)
Medication: diazepam (VALIUM) 5 MG tablet   Has the patient contacted their pharmacy? Yes.   (If no, request that the patient contact the pharmacy for the refill.) (If yes, when and what did the pharmacy advise?)  Preferred Pharmacy (with phone number or street name):  [id number] - [place], [place] - [address] AT [facility] [address] & [address] Phone:  [phone]      Agent: Please be advised that RX refills may take up to 3 business days. We ask that you follow-up with your pharmacy.

## 2019-07-06 NOTE — Telephone Encounter (Signed)
Last written: 05/21/19 Last ov: 07/03/19 Next ov: 10/03/19 Contract: 01/10/18 UDS: none

## 2019-07-06 NOTE — Telephone Encounter (Signed)
PDMP reviewed, was dispensed diazepam No. 30 tablets on 05/21/2019 RX sent

## 2019-07-07 LAB — URINE CULTURE
MICRO NUMBER:: 10240559
SPECIMEN QUALITY:: ADEQUATE

## 2019-07-08 ENCOUNTER — Other Ambulatory Visit: Payer: Self-pay | Admitting: Internal Medicine

## 2019-07-08 MED ORDER — SULFAMETHOXAZOLE-TRIMETHOPRIM 800-160 MG PO TABS: 1.0000 | ORAL_TABLET | Freq: Two times a day (BID) | ORAL | 0 refills | Status: AC

## 2019-07-13 ENCOUNTER — Telehealth: Payer: Self-pay | Admitting: Internal Medicine

## 2019-07-13 DIAGNOSIS — R296 Repeated falls: Secondary | ICD-10-CM

## 2019-07-13 DIAGNOSIS — R269 Unspecified abnormalities of gait and mobility: Secondary | ICD-10-CM

## 2019-07-13 DIAGNOSIS — R627 Adult failure to thrive: Secondary | ICD-10-CM

## 2019-07-13 NOTE — Telephone Encounter (Signed)
Please arrange: Home PT referral, DX gait disorder, had 2 falls Palliative care: General debility, failure to thrive. ==== Spoke with [name], she had 2 falls in the last few days fortunately without apparent major consequence. She does have a lifeline but did not use it. The caregiver is her husband who is also elderly. They need some help. We agreed on PT and palliative care referral.

## 2019-07-13 NOTE — Telephone Encounter (Signed)
Please advise 

## 2019-07-13 NOTE — Telephone Encounter (Signed)
Pt has fallen twice since visist. Daughter [name] Wants to know what suggestions he may have to further care for her mother. She would like a call back to possibly discuss placing her in a home.   Please Call back [name] [phone]

## 2019-07-13 NOTE — Telephone Encounter (Signed)
Referrals placed 

## 2019-07-19 ENCOUNTER — Telehealth: Payer: Self-pay

## 2019-07-19 NOTE — Telephone Encounter (Signed)
Spoke w/ Tinnie Gens- verbal orders given .

## 2019-07-19 NOTE — Telephone Encounter (Signed)
Jennie from [facility] called in to get verbal orders for once a week for 1 week. Two times a week for 6 weeks, and 1 time a week for 2 weeks, For Home Health physical therapy. Please call Tinnie Gens at [phone] thanks,

## 2019-07-20 ENCOUNTER — Telehealth: Payer: Self-pay | Admitting: Internal Medicine

## 2019-07-20 NOTE — Telephone Encounter (Signed)
Called patient's daughter [name] to schedule a Palliative Consult, no answer - left message with reason for call along with my name and contact number.

## 2019-07-23 ENCOUNTER — Other Ambulatory Visit: Payer: Self-pay

## 2019-07-23 MED ORDER — GABAPENTIN 300 MG PO CAPS: ORAL_CAPSULE | ORAL | 1 refills | Status: AC

## 2019-07-23 MED ORDER — ATORVASTATIN CALCIUM 40 MG PO TABS: 20.0000 mg | ORAL_TABLET | Freq: Every day | ORAL | 1 refills | Status: AC

## 2019-07-24 ENCOUNTER — Telehealth: Payer: Self-pay | Admitting: Internal Medicine

## 2019-07-24 NOTE — Telephone Encounter (Signed)
Scheduled [facility] Palliative visit for 07-26-19 at 2:30.

## 2019-07-26 ENCOUNTER — Other Ambulatory Visit: Payer: Self-pay

## 2019-07-26 ENCOUNTER — Encounter: Payer: Self-pay | Admitting: Internal Medicine

## 2019-07-26 ENCOUNTER — Other Ambulatory Visit: Payer: Medicare Other | Admitting: Internal Medicine

## 2019-07-26 DIAGNOSIS — Z7189 Other specified counseling: Secondary | ICD-10-CM

## 2019-07-26 DIAGNOSIS — Z515 Encounter for palliative care: Secondary | ICD-10-CM

## 2019-07-26 NOTE — Progress Notes (Signed)
April 1st, 2021 [facility] Palliative Care Consult Note Telephone: [phone]  Fax: [phone]  PATIENT NAME: Sara [name] DOB: Dec 15, 1940 MRN: [id number]  [address], [place] [address]  PRIMARY CARE PROVIDER:   Wanda Plump, MD  REFERRING PROVIDER:  Wanda Plump, MD [address] [place],  [place] [address]  RESPONSIBLE PARTY: Sara [name] [phone]  ASSESSMENT / RECOMMENDATIONS:  1. Advance Care Planning:       A. Directives: Discussed with patient in the presence of her husband and 2 daughters. Patient wishes full resuscitative efforts in the event of a cardiopulmonary arrest. Patient has a HCPOA (present in [facility] EMR) naming her 2 daughters, and a Living WIll       B. Goals of Care: Would like to start spending time in her living room and screened porch. Daughters would love to take patient to the beach. We discussed making plans towards these goals.  2. Cognitive / Functional status: (PCP visit 07/03/19) Patient is A & O x 3. She takes 1/2 of a 5mg  Valium about qod for anxiety.   Weepy eyes and drippy nose; seasonal. She takes Benedryl 25mg  qd, and an Afrin like nasal spray.       -discussed starting Claritin qd, with Flonase Nasal Spray 1 application each nare qd to bid. Also use of saline nasal spray. Recommended discontinuation fo Benedryl d/t sedation effects, and decongestant d/t rebound congestion.  She is independent in transfers and ambulates short distances within the home with a walker, wheelchair for outside trips. She is independent in ADLs, with stand by assist to shower. She has a side rail on the bed. Her mobility is hindered by her back pain. She has had 2 falls about 2 weeks ago. She is continent of bowel; occasionally nocturnal urinary incontinence.       -consider bedside commode for nocturnal use; decrease risk of falls.      -working with home PT; we discussed the importance of doing prescribed exercises on a  regular basis.  Diminished appetite. Current weight is 133 lbs. At a height of 5'5" her BMI is 22.17kg/m2. She's lost about 10 lbs over the last 6 months or so. Symptoms of GERD well managed with Prilosec.       -if continued weight loss patient would consider use of appetite stimulant such as Remeron.  Upper and lower back pain d/t multi surgeries/hardware/vetebral fractures/osteopenia. Has a nerve stimulator (adjusted earlier this month to good effect) that helps to manage lower back pain, but upper spinal pain continues moderately severe.Opioids managed by [facility] (monthly office visits). Current regimen Oxycontin 10 mg bid, with Oxycodone/acetominophen 7.5/325 for breakthrough pain (averages bid). Heating pad with improvement. Her opioids are managed by [facility]. LE neuropathy well managed with Gabapentin.       -discussed adding bid tylenol 1000mg  with ibuprofen 200mg  bid to regimen. Discussed disadvantage of long term opioid use.  Patient is continent of bowel. Possibility of opioid induced constipation well managed with stool softener 3x/wk. Episodically incontinent of bladder; urgency but pain limits her speed to get to the bathroom. Discussed use of washable incontinence pads. Discussed use of bedside commode.   A few times a week (and twice yesterday) patient experienced transient episodes of dizziness/sweating, of uncertain etiology. She has a pulse oximeter, and I asked her husband to check her pulse at those times and let know what it is.   3.  Family / Community Supports: [facility]) PT for 8 wks. Patient's caregiver is her husband who is also elderly.  4. Follow up Palliative Care Visit: 09/25/19 @ 3pm  I spent 60 minutes providing this consultation from 2:30pm to 3:30pm. More than 50% of the time in this consultation was spent coordinating communication.   HISTORY OF PRESENT ILLNESS:  Sara [name] is as 79 y.o. female with  neuropathy, HLD, osteoporosis (hip fx, vertebral fx, chronic back pain (spinal cord stimulatory Dr. Jordan [name]) anxiety/depression (prnValium/delf d/c ssri trial), mild dementia, CAD (stroke 2013), Carotid artery disease (endarterectomy L 2013), MVP, recently diagnosed Bullous Pemphigiod skin condition (Dr. Yetta [name])  Palliative Care was asked to help address goals of care. Four daughters "Sara [name] is my buddy; don't know what I'd do without her". Another dtr in [place], one in [place]  CODE STATUS:   PPS: 0% HOSPICE ELIGIBILITY/DIAGNOSIS: TBD  PAST MEDICAL HISTORY:  Past Medical History:  Diagnosis Date  . Anxiety   . Arthritis   . Bronchitis    hx of  . Carotid artery occlusion   . Chronic pain syndrome    pain management  . Coronary artery disease    sees Dr. Patty [name]  . Dementia (HCC)   . GERD (gastroesophageal reflux disease)    hx of  . H/O hiatal hernia   . Hypercholesteremia   . Hypertension    all managed by Dr. Haynes [name] bill  . Mitral valve regurgitation    trace  . Non-traumatic compression fracture of thoracic vertebra (HCC)    T9  . Pneumonia 05/27/2017  . S/P insertion of spinal cord stimulator    "i just charge it every couple of weeks , i dont have a remote "   . Stroke (HCC)   . Wears dentures   . Wears glasses     SOCIAL HX:  Social History   Tobacco Use  . Smoking status: Former Smoker    Packs/day: 0.50    Years: 60.00    Pack years: 30.00    Types: Cigarettes  . Smokeless tobacco: Never Used  . Tobacco comment: quit 04-2015  Substance Use Topics  . Alcohol use: No    Alcohol/week: 0.0 standard drinks    ALLERGIES:  Allergies  Allergen Reactions  . Penicillins Hives and Rash    Did it involve swelling of the face/tongue/throat, SOB, or low BP? No Did it involve sudden or severe rash/hives, skin peeling, or any reaction on the inside of your mouth or nose? No Did you need to seek medical attention at a hospital or doctor's office? No When did it last  happen?20+ years If all above answers are "NO", may proceed with cephalosporin use.    . Iodinated Diagnostic Agents Hives and Itching    01/11/19: Had CT arthrogram 01/09/19 and developed itching/hives after.  Needs 13-hour prep in the future.  . Morphine And Related Rash  . Hydrochlorothiazide Other (See Comments)    Pt does not remember  . Indomethacin     Pt does not remember  . Paroxetine Hcl     Pt does not remember  . Pravachol Other (See Comments)    Pt does not remember  . Quinine Derivatives     Pt does not remember  . Wellbutrin [Bupropion]     agitation  . Zocor [Simvastatin - High Dose] Other (See Comments)    Weakness/no energy     PERTINENT MEDICATIONS:  Outpatient Encounter Medications as of 07/26/2019  Medication Sig  .  amLODipine (NORVASC) 10 MG tablet Take 1 tablet (10 mg total) by mouth daily.  Marland Kitchen aspirin EC 81 MG tablet Take 1 tablet (81 mg total) by mouth 2 (two) times daily. For DVT prophylaxis for 30 days after surgery.  Marland Kitchen atorvastatin (LIPITOR) 40 MG tablet Take 0.5 tablets (20 mg total) by mouth daily.  Marland Kitchen augmented betamethasone dipropionate (DIPROLENE-AF) 0.05 % cream Apply topically 2 (two) times daily.  . diazepam (VALIUM) 5 MG tablet Take 1 tablet (5 mg total) by mouth daily as needed.  . docusate sodium (COLACE) 100 MG capsule Take 100 mg by mouth 3 (three) times a week.  . donepezil (ARICEPT) 10 MG tablet Take 1 tablet (10 mg total) by mouth at bedtime.  . fluticasone (FLONASE) 50 MCG/ACT nasal spray Place 1 spray into both nostrils daily.  Marland Kitchen gabapentin (NEURONTIN) 300 MG capsule TAKE 1 CAPSULE BY MOUTH EVERY MORNING AND 2 CAPSULE BY MOUTH EVERY NIGHT AT BEDTIME  . loratadine (CLARITIN) 10 MG tablet Take 10 mg by mouth daily as needed for allergies.   . metoprolol tartrate (LOPRESSOR) 50 MG tablet Take 0.5 tablets (25 mg total) by mouth 2 (two) times daily.  . Multiple Vitamin (MULTIVITAMIN WITH MINERALS) TABS tablet Take 1 tablet by mouth daily.    Marland Kitchen omeprazole (PRILOSEC) 20 MG capsule Take 1 capsule (20 mg total) by mouth daily. 30 days for gastroprotection while taking NSAIDs.  Marland Kitchen oxyCODONE-acetaminophen (PERCOCET) 10-325 MG tablet Take 1 tablet by mouth 3 (three) times daily.  . diphenhydrAMINE (BENADRYL) 25 mg capsule Take 25 mg by mouth every 6 (six) hours as needed (runny nose).  . hydrocortisone 2.5 % cream Apply topically 2 (two) times daily. (Patient not taking: Reported on 07/26/2019)  . ondansetron (ZOFRAN) 4 MG tablet Take 1 tablet (4 mg total) by mouth every 8 (eight) hours as needed for nausea or vomiting. (Patient not taking: Reported on 07/26/2019)  . sulfamethoxazole-trimethoprim (BACTRIM DS) 800-160 MG tablet Take 1 tablet by mouth 2 (two) times daily.  . [DISCONTINUED] DULoxetine (CYMBALTA) 30 MG capsule Take 1 capsule (30 mg total) by mouth 2 (two) times daily.  . [DISCONTINUED] HYSINGLA ER 40 MG T24A Take 40 mg by mouth at bedtime.    No facility-administered encounter medications on file as of 07/26/2019.    PHYSICAL EXAM:   General: NAD, slender, lying in bed. Spouse and 2 daughters in attendance. She is pleasantly conversant, alert, and engaging. PE deferred to decrease risk of COVID exposure Extremities: no edema, no joint deformities Skin: no rashes Neurological: Weakness but otherwise non-focal  Julianne Handler, NP

## 2019-07-30 DIAGNOSIS — E785 Hyperlipidemia, unspecified: Secondary | ICD-10-CM

## 2019-07-30 DIAGNOSIS — I2510 Atherosclerotic heart disease of native coronary artery without angina pectoris: Secondary | ICD-10-CM

## 2019-07-30 DIAGNOSIS — Z8673 Personal history of transient ischemic attack (TIA), and cerebral infarction without residual deficits: Secondary | ICD-10-CM

## 2019-07-30 DIAGNOSIS — M549 Dorsalgia, unspecified: Secondary | ICD-10-CM

## 2019-07-30 DIAGNOSIS — G6289 Other specified polyneuropathies: Secondary | ICD-10-CM

## 2019-07-30 DIAGNOSIS — M810 Age-related osteoporosis without current pathological fracture: Secondary | ICD-10-CM

## 2019-07-30 DIAGNOSIS — G8929 Other chronic pain: Secondary | ICD-10-CM

## 2019-07-30 DIAGNOSIS — Z96651 Presence of right artificial knee joint: Secondary | ICD-10-CM

## 2019-07-30 DIAGNOSIS — F418 Other specified anxiety disorders: Secondary | ICD-10-CM

## 2019-07-30 DIAGNOSIS — Z87891 Personal history of nicotine dependence: Secondary | ICD-10-CM

## 2019-07-30 DIAGNOSIS — R5383 Other fatigue: Secondary | ICD-10-CM

## 2019-07-30 DIAGNOSIS — I10 Essential (primary) hypertension: Secondary | ICD-10-CM

## 2019-07-30 DIAGNOSIS — M1990 Unspecified osteoarthritis, unspecified site: Secondary | ICD-10-CM

## 2019-07-30 DIAGNOSIS — F0390 Unspecified dementia without behavioral disturbance: Secondary | ICD-10-CM

## 2019-07-30 DIAGNOSIS — K219 Gastro-esophageal reflux disease without esophagitis: Secondary | ICD-10-CM

## 2019-07-30 DIAGNOSIS — N390 Urinary tract infection, site not specified: Secondary | ICD-10-CM

## 2019-07-30 DIAGNOSIS — Z9682 Presence of neurostimulator: Secondary | ICD-10-CM

## 2019-07-31 ENCOUNTER — Telehealth: Payer: Self-pay

## 2019-07-31 NOTE — Telephone Encounter (Signed)
Plan of care signed and faxed back to [facility] at [phone]. Form sent for scanning.

## 2019-08-02 ENCOUNTER — Telehealth: Payer: Self-pay | Admitting: Internal Medicine

## 2019-08-07 ENCOUNTER — Telehealth: Payer: Self-pay | Admitting: Internal Medicine

## 2019-08-07 NOTE — Telephone Encounter (Signed)
LMOM informing Amy that PCP would be attending. Informed to return call if questions/concerns.

## 2019-08-07 NOTE — Telephone Encounter (Signed)
Caller : Amy/[facility]   Call BACK # [phone]   Will Dr Drue Novel, Attending MD for patient that was referred to [facility] .   Please Advise

## 2019-08-07 NOTE — Telephone Encounter (Signed)
error 

## 2019-08-07 NOTE — Telephone Encounter (Signed)
Yes, thx

## 2019-08-07 NOTE — Telephone Encounter (Signed)
Please advise 

## 2019-08-17 ENCOUNTER — Telehealth: Payer: Self-pay

## 2019-08-17 NOTE — Telephone Encounter (Signed)
Comfort care orders received from [facility]. Orders signed and faxed back to [phone]. Orders sent for scanning.

## 2019-08-28 ENCOUNTER — Telehealth: Payer: Self-pay

## 2019-08-28 NOTE — Telephone Encounter (Signed)
Due for prolia later this month. I was going to reverify her insurance to amgen assist. However I noticed she has been placed in palliative care and has had recent "comfort care"  Orders. Should she continue prolia?

## 2019-08-28 NOTE — Telephone Encounter (Signed)
Please call the patient, if she is willing to come to the office to get the injection ok to  proceed.

## 2019-09-11 NOTE — Telephone Encounter (Signed)
Orders received again- signed and faxed back to [facility] at [phone]. Received fax confirmation.

## 2019-09-25 ENCOUNTER — Other Ambulatory Visit: Payer: Self-pay

## 2019-09-25 ENCOUNTER — Other Ambulatory Visit: Admitting: Internal Medicine

## 2019-10-03 ENCOUNTER — Ambulatory Visit: Payer: Medicare Other | Admitting: Internal Medicine

## 2019-10-03 DIAGNOSIS — Z0289 Encounter for other administrative examinations: Secondary | ICD-10-CM

## 2019-10-12 ENCOUNTER — Telehealth: Payer: Self-pay

## 2019-10-12 NOTE — Telephone Encounter (Signed)
Hospice orders signed and faxed back to [employer] [facility] at [phone]. Order sent for scanning.

## 2019-11-19 ENCOUNTER — Telehealth: Payer: Self-pay

## 2019-11-19 NOTE — Telephone Encounter (Signed)
Received hospice notification. Pt passed away on 12/10/2019.

## 2019-11-20 NOTE — Telephone Encounter (Signed)
Spoke with [name], condolences provided.  [name]: Please see if the death certificate is ready for me to sign

## 2019-11-21 NOTE — Telephone Encounter (Signed)
Spoke w/ Rudell Cobb at [employer] [facility] [employer] at [phone] informed death certificate ready for pick up. Copy faxed to them as requested at [phone], and copy sent for scanning.

## 2019-11-21 NOTE — Telephone Encounter (Signed)
New message:   Madelaine Bhat is calling and asking if we have received Death Certificate for pt. After speaking with him they dropped it off at [address]. Sherri is now going to that office to see if can be located.

## 2019-11-21 NOTE — Telephone Encounter (Signed)
We have not received death certificate yet.

## 2019-11-21 NOTE — Telephone Encounter (Signed)
Signed today, cause of death: CAD, gait disorder, frequent falls. Multifactorial.

## 2019-11-21 NOTE — Telephone Encounter (Signed)
Death certificate received. Given to Dr. Drue Novel.

## 2019-11-23 ENCOUNTER — Telehealth: Payer: Self-pay

## 2019-11-23 NOTE — Telephone Encounter (Signed)
Post mortem hospice orders received by [employer]. Orders signed and faxed back to [phone]. Orders sent for scanning.

## 2019-11-25 DEATH — deceased

## 2020-03-24 ENCOUNTER — Ambulatory Visit: Payer: Self-pay | Admitting: *Deleted
